# Patient Record
Sex: Female | Born: 1940 | Race: White | Hispanic: No | Smoking: Former smoker
Health system: Southern US, Community
[De-identification: ages and names within clinical notes are randomized; demographics above are authoritative.]

## PROBLEM LIST (undated history)

## (undated) DIAGNOSIS — I10 Essential (primary) hypertension: Secondary | ICD-10-CM

## (undated) DIAGNOSIS — E7800 Pure hypercholesterolemia, unspecified: Secondary | ICD-10-CM

## (undated) DIAGNOSIS — T8859XA Other complications of anesthesia, initial encounter: Secondary | ICD-10-CM

## (undated) DIAGNOSIS — E039 Hypothyroidism, unspecified: Secondary | ICD-10-CM

## (undated) DIAGNOSIS — S82853A Displaced trimalleolar fracture of unspecified lower leg, initial encounter for closed fracture: Secondary | ICD-10-CM

## (undated) DIAGNOSIS — N189 Chronic kidney disease, unspecified: Secondary | ICD-10-CM

## (undated) DIAGNOSIS — G7000 Myasthenia gravis without (acute) exacerbation: Secondary | ICD-10-CM

## (undated) HISTORY — PX: EYE SURGERY: SHX253

## (undated) HISTORY — PX: OTHER SURGICAL HISTORY: SHX169

---

## 1997-11-09 ENCOUNTER — Ambulatory Visit (HOSPITAL_COMMUNITY): Admission: RE | Admit: 1997-11-09 | Discharge: 1997-11-09 | Payer: Self-pay | Admitting: Family Medicine

## 1998-11-14 ENCOUNTER — Ambulatory Visit (HOSPITAL_COMMUNITY): Admission: RE | Admit: 1998-11-14 | Discharge: 1998-11-14 | Payer: Self-pay | Admitting: Psychiatry

## 1998-11-14 ENCOUNTER — Encounter: Payer: Self-pay | Admitting: Family Medicine

## 1999-04-07 ENCOUNTER — Encounter: Payer: Self-pay | Admitting: Family Medicine

## 1999-04-07 ENCOUNTER — Ambulatory Visit (HOSPITAL_COMMUNITY): Admission: RE | Admit: 1999-04-07 | Discharge: 1999-04-07 | Payer: Self-pay | Admitting: Family Medicine

## 1999-11-20 ENCOUNTER — Encounter: Payer: Self-pay | Admitting: Family Medicine

## 1999-11-20 ENCOUNTER — Ambulatory Visit (HOSPITAL_COMMUNITY): Admission: RE | Admit: 1999-11-20 | Discharge: 1999-11-20 | Payer: Self-pay | Admitting: Family Medicine

## 2000-11-28 ENCOUNTER — Encounter: Payer: Self-pay | Admitting: Family Medicine

## 2000-11-28 ENCOUNTER — Ambulatory Visit (HOSPITAL_COMMUNITY): Admission: RE | Admit: 2000-11-28 | Discharge: 2000-11-28 | Payer: Self-pay | Admitting: Family Medicine

## 2001-08-29 ENCOUNTER — Encounter (INDEPENDENT_AMBULATORY_CARE_PROVIDER_SITE_OTHER): Payer: Self-pay | Admitting: Specialist

## 2001-08-29 ENCOUNTER — Ambulatory Visit (HOSPITAL_COMMUNITY): Admission: RE | Admit: 2001-08-29 | Discharge: 2001-08-29 | Payer: Self-pay | Admitting: *Deleted

## 2001-12-01 ENCOUNTER — Encounter: Payer: Self-pay | Admitting: Family Medicine

## 2001-12-01 ENCOUNTER — Ambulatory Visit (HOSPITAL_COMMUNITY): Admission: RE | Admit: 2001-12-01 | Discharge: 2001-12-01 | Payer: Self-pay | Admitting: Family Medicine

## 2002-12-04 ENCOUNTER — Encounter: Payer: Self-pay | Admitting: Family Medicine

## 2002-12-04 ENCOUNTER — Ambulatory Visit (HOSPITAL_COMMUNITY): Admission: RE | Admit: 2002-12-04 | Discharge: 2002-12-04 | Payer: Self-pay | Admitting: Family Medicine

## 2003-05-03 ENCOUNTER — Other Ambulatory Visit: Admission: RE | Admit: 2003-05-03 | Discharge: 2003-05-03 | Payer: Self-pay | Admitting: Family Medicine

## 2003-12-06 ENCOUNTER — Ambulatory Visit (HOSPITAL_COMMUNITY): Admission: RE | Admit: 2003-12-06 | Discharge: 2003-12-06 | Payer: Self-pay | Admitting: Family Medicine

## 2004-12-06 ENCOUNTER — Ambulatory Visit (HOSPITAL_COMMUNITY): Admission: RE | Admit: 2004-12-06 | Discharge: 2004-12-06 | Payer: Self-pay | Admitting: Family Medicine

## 2005-05-14 ENCOUNTER — Other Ambulatory Visit: Admission: RE | Admit: 2005-05-14 | Discharge: 2005-05-14 | Payer: Self-pay | Admitting: Family Medicine

## 2005-12-11 ENCOUNTER — Ambulatory Visit (HOSPITAL_COMMUNITY): Admission: RE | Admit: 2005-12-11 | Discharge: 2005-12-11 | Payer: Self-pay | Admitting: Family Medicine

## 2005-12-11 IMAGING — MG MM DIGITAL SCREENING BILAT
3 series · 3 of 3 positions shown · non-contrast
Comparison: none

DG SCREEN MAMMOGRAM BILATERAL
Bilateral CC and MLO view(s) were taken.
Prior study comparison: [DATE], bilateral screening mammogram.

SCREENING MAMMOGRAM:
The breast tissue is heterogeneously dense.  There is no dominant mass, architectural distortion or
calcification to suggest malignancy.

[R MLO]
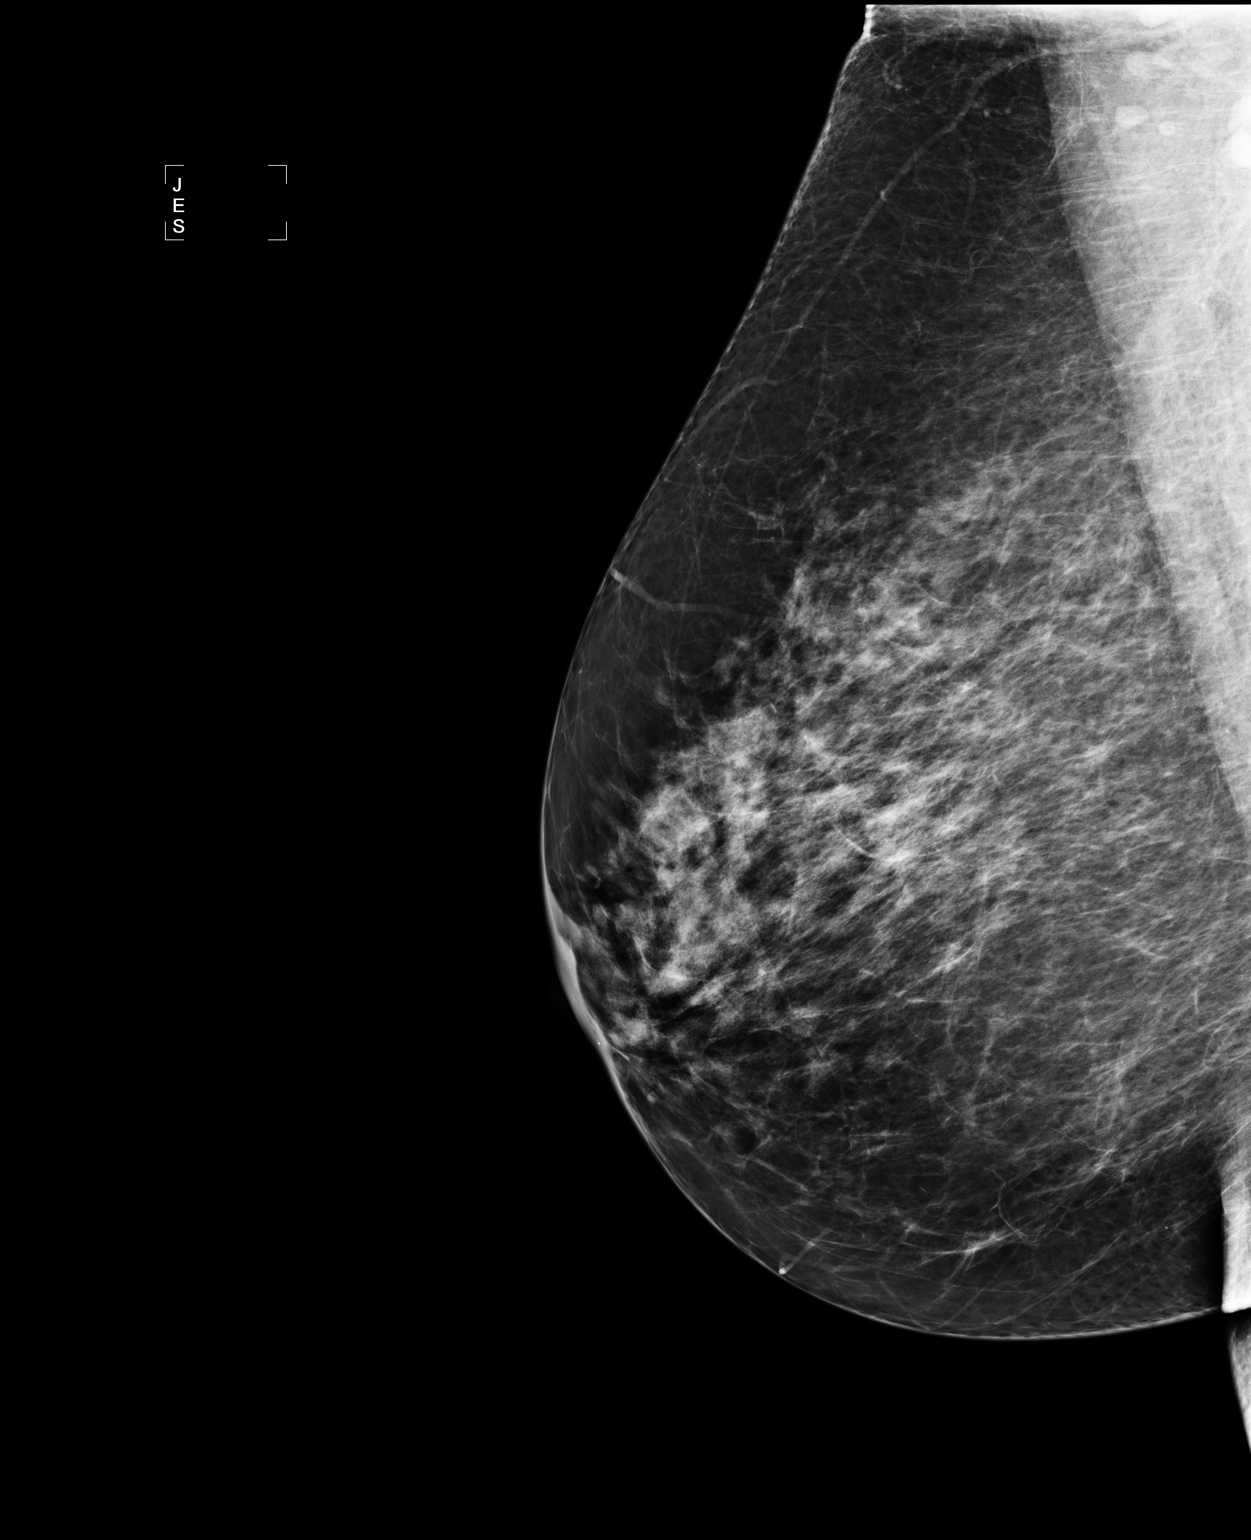

[L CC]
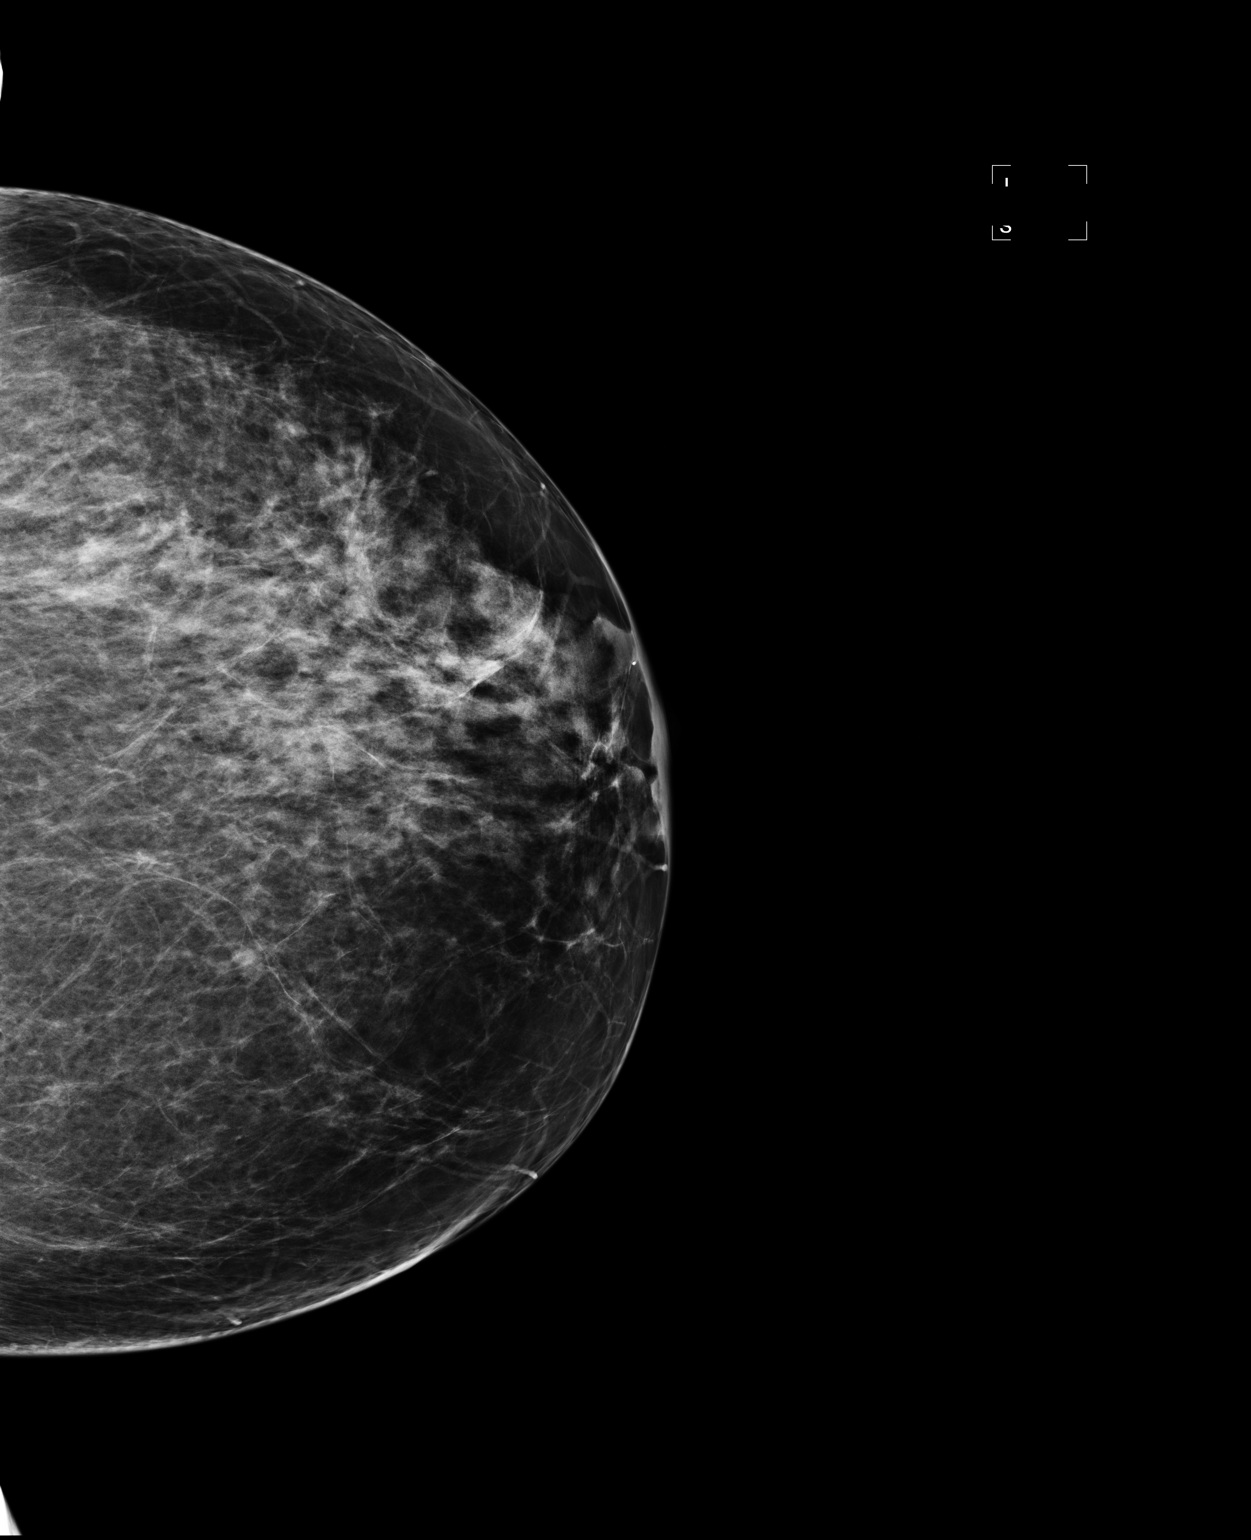

[L MLO]
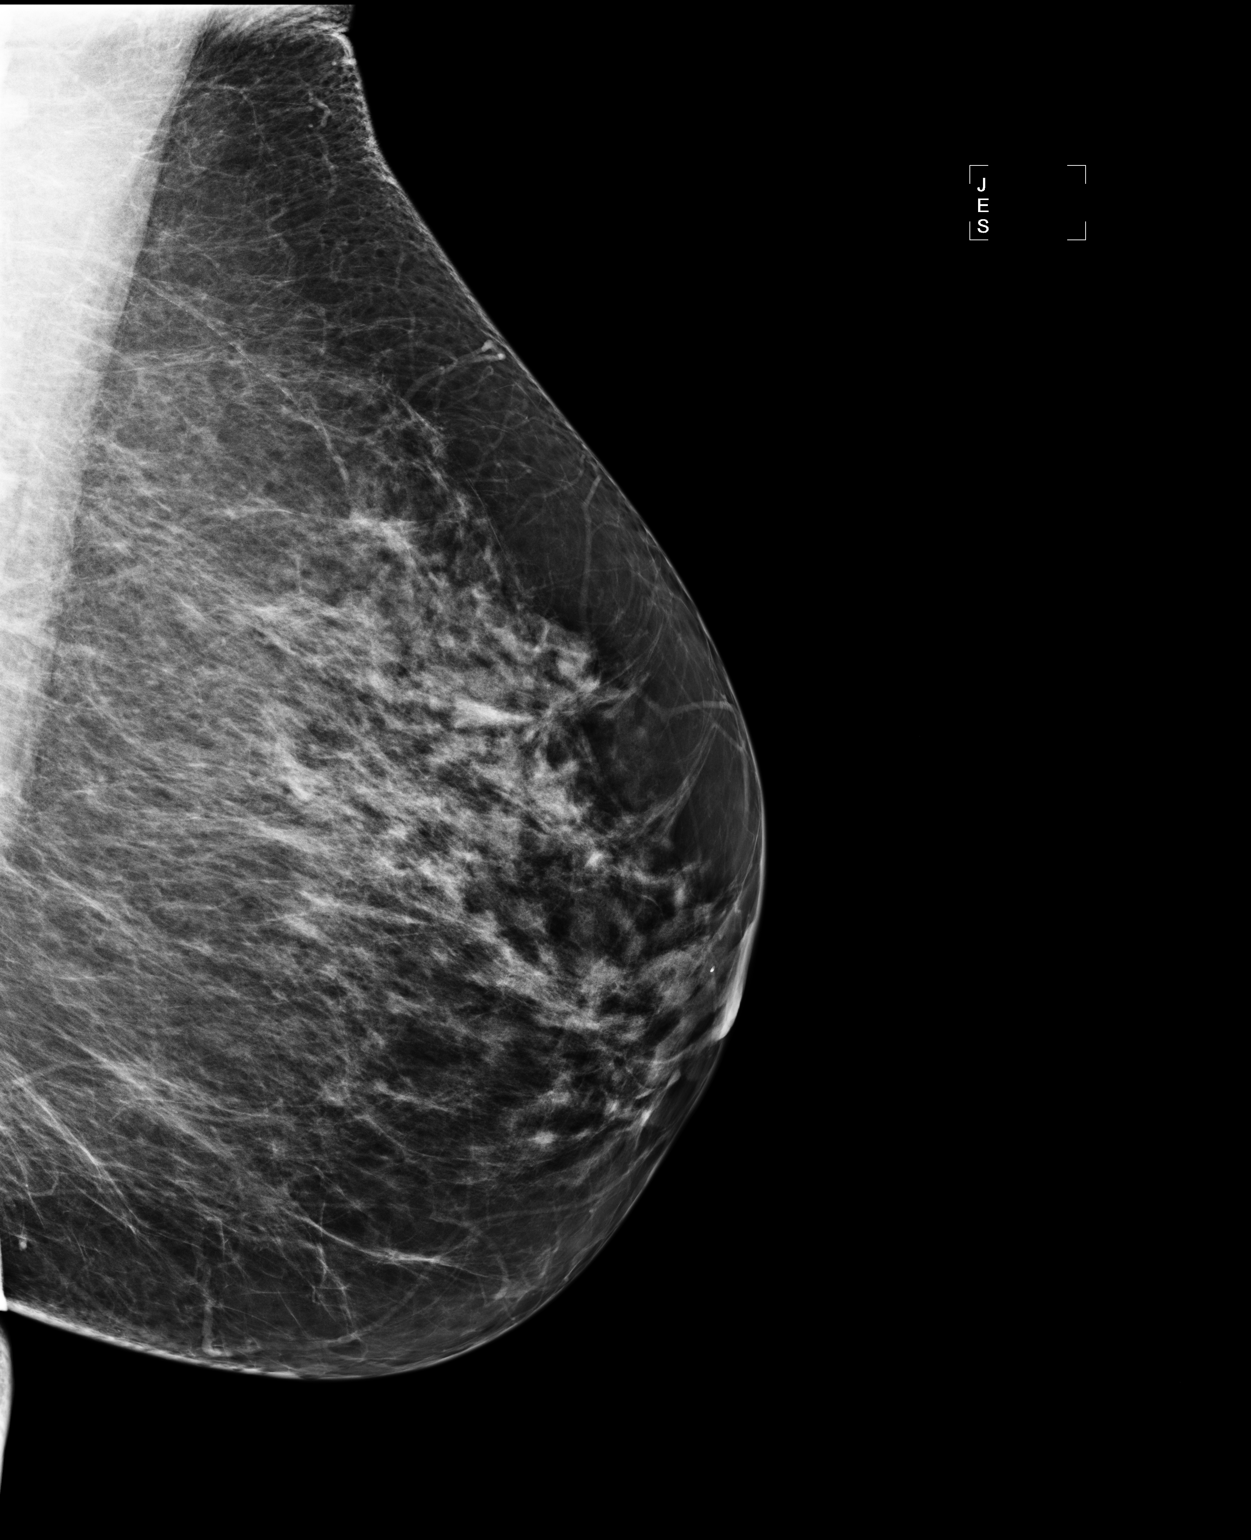

[3 of 3 positions shown; findings below may reference images not displayed]

IMPRESSION: No mammographic evidence of malignancy.  Suggest yearly screening mammography.

ASSESSMENT: Negative - BI-RADS 1

Screening mammogram in 1 year.
ANALYZED BY COMPUTER AIDED DETECTION. , THIS PROCEDURE WAS A DIGITAL MAMMOGRAM.

## 2006-12-16 ENCOUNTER — Ambulatory Visit (HOSPITAL_COMMUNITY): Admission: RE | Admit: 2006-12-16 | Discharge: 2006-12-16 | Payer: Self-pay | Admitting: Family Medicine

## 2006-12-16 IMAGING — MG MM DIGITAL SCREENING BILAT
4 series · 4 of 4 positions shown · non-contrast
Comparison: none

DG SCREEN MAMMOGRAM BILATERAL
Bilateral CC and MLO view(s) were taken.

DIGITAL SCREENING MAMMOGRAM WITH CAD:
There are scattered fibroglandular densities.  No masses or malignant type calcifications are 
identified.  Compared with prior studies.

[R CC]
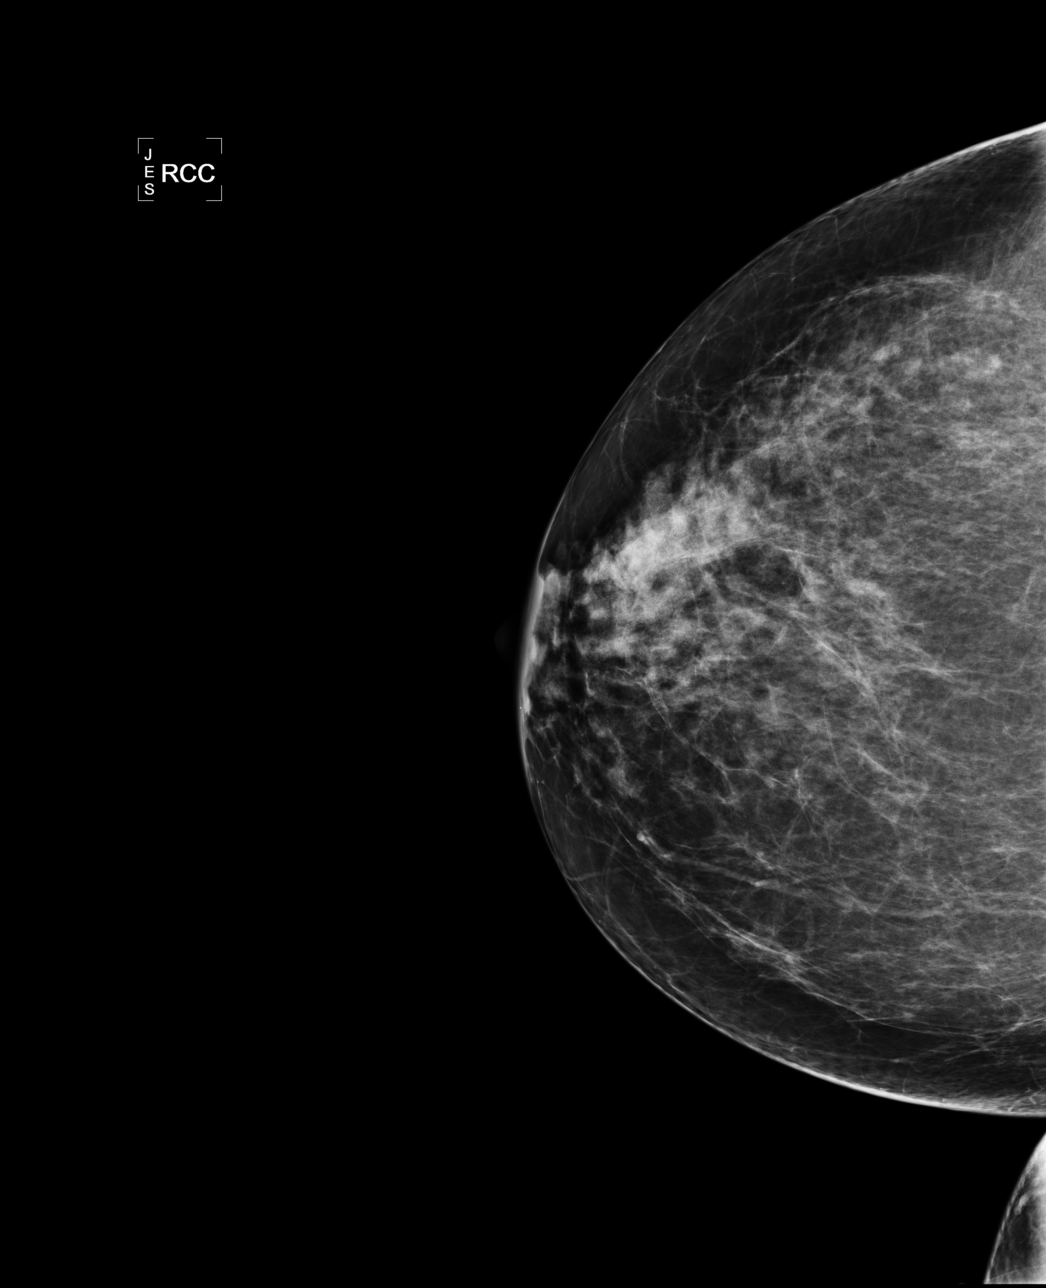

[R MLO]
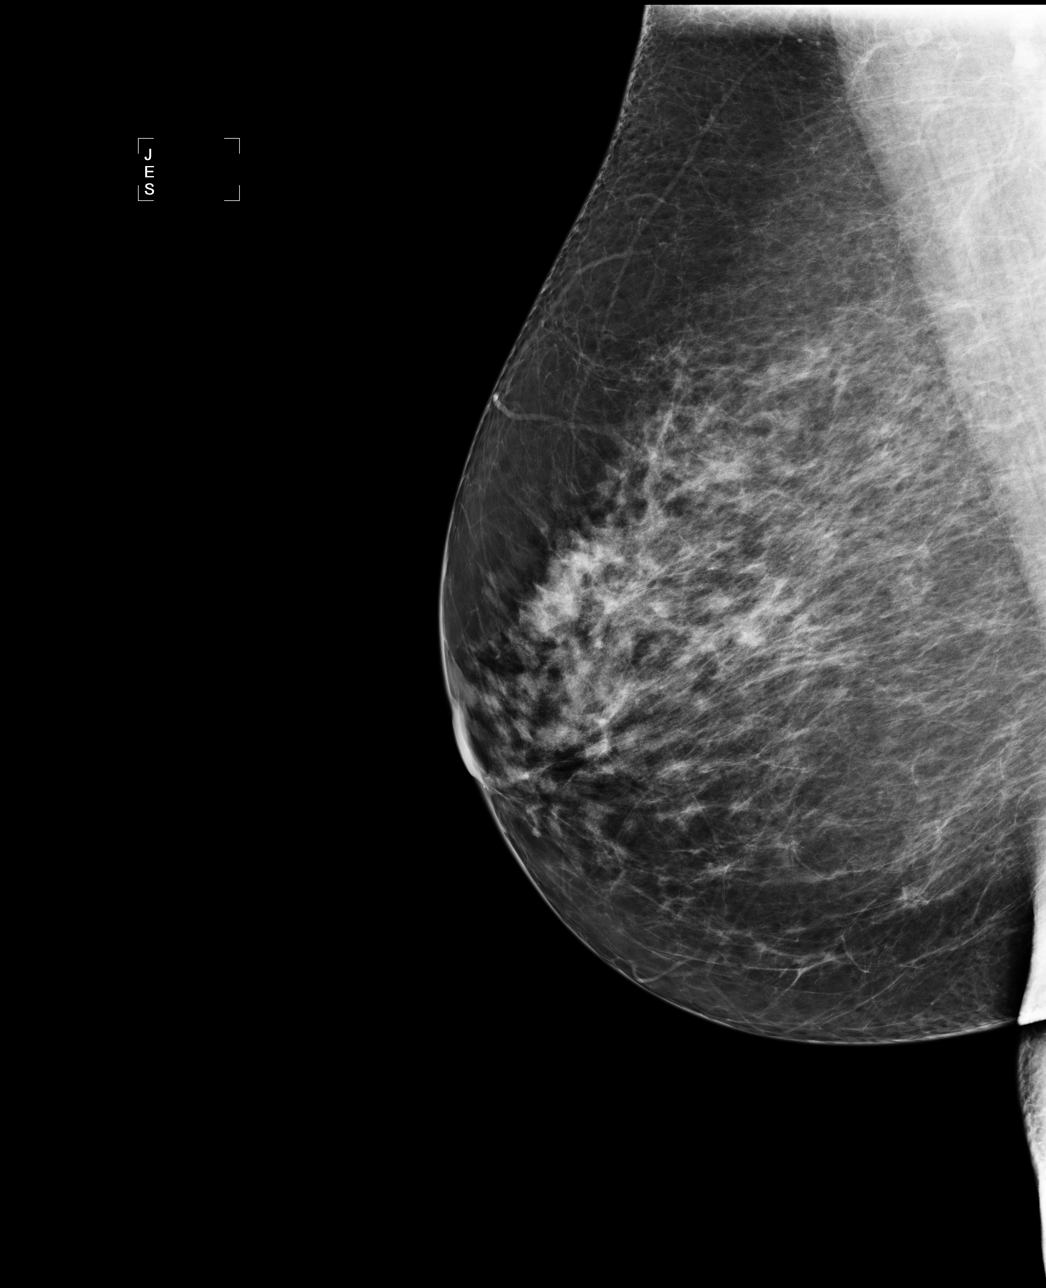

[L CC]
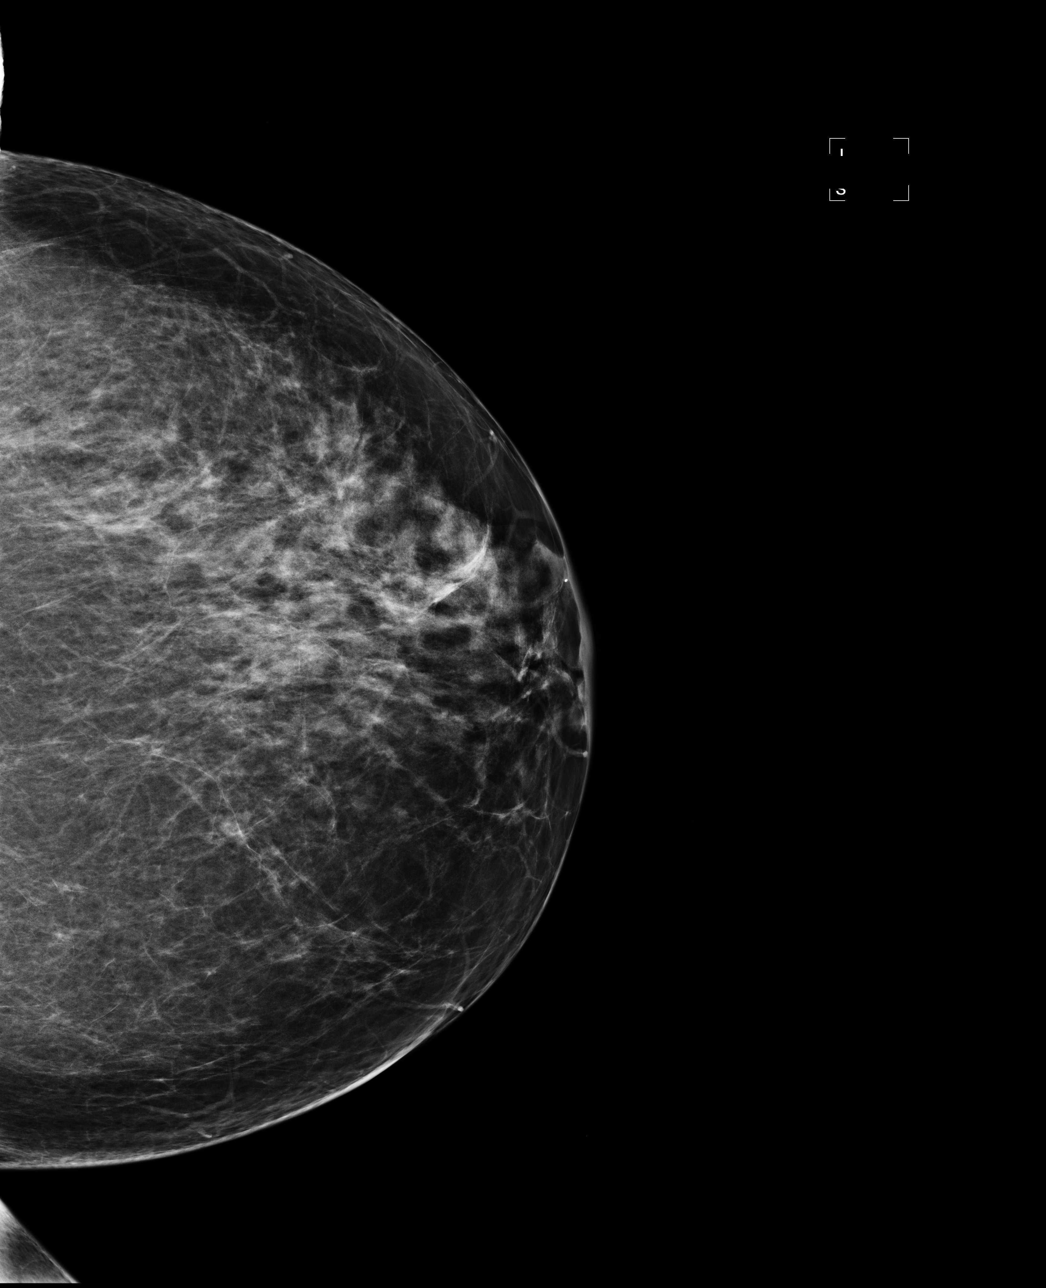

[L MLO]
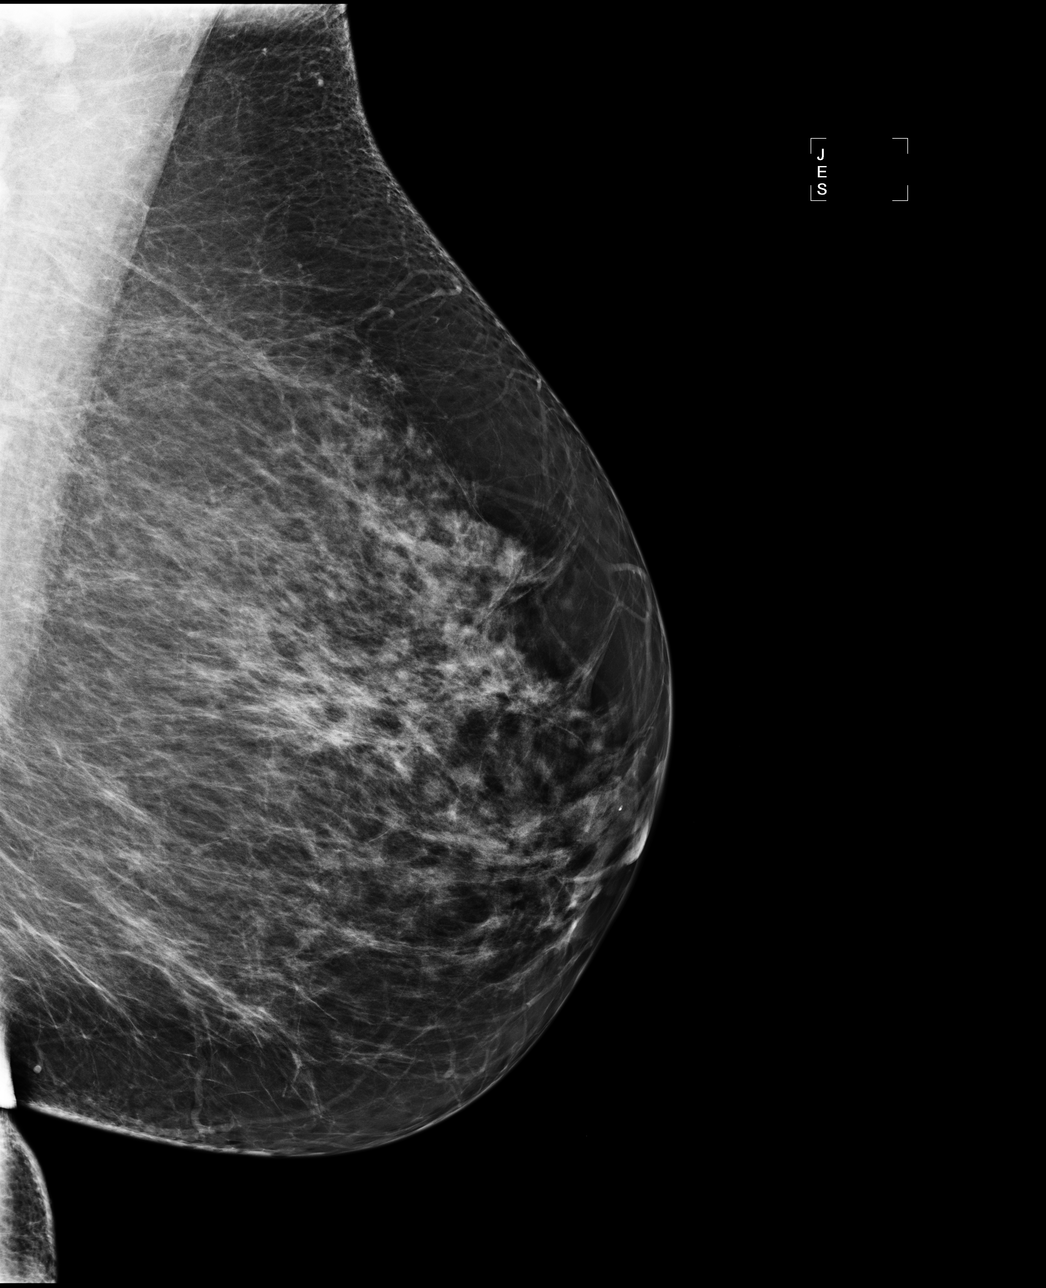

[4 of 4 positions shown; findings below may reference images not displayed]

IMPRESSION: No specific mammographic evidence of malignancy.  Next screening mammogram is recommended in one 
year.

ASSESSMENT: Negative - BI-RADS 1

Screening mammogram in 1 year.
ANALYZED BY COMPUTER AIDED DETECTION. , THIS PROCEDURE WAS A DIGITAL MAMMOGRAM.

## 2007-05-28 ENCOUNTER — Other Ambulatory Visit: Admission: RE | Admit: 2007-05-28 | Discharge: 2007-05-28 | Payer: Self-pay | Admitting: Family Medicine

## 2007-12-19 ENCOUNTER — Ambulatory Visit (HOSPITAL_COMMUNITY): Admission: RE | Admit: 2007-12-19 | Discharge: 2007-12-19 | Payer: Self-pay | Admitting: Family Medicine

## 2007-12-19 IMAGING — MG MM DIGITAL SCREENING BILAT
6 series · 6 of 6 positions shown · non-contrast
Comparison: none

DG SCREEN MAMMOGRAM BILATERAL
Bilateral CC and MLO view(s) were taken.

DIGITAL SCREENING MAMMOGRAM WITH CAD:
The breast tissue is heterogeneously dense.  No masses or malignant type calcifications are 
identified.  Compared with prior studies.

[R CC]
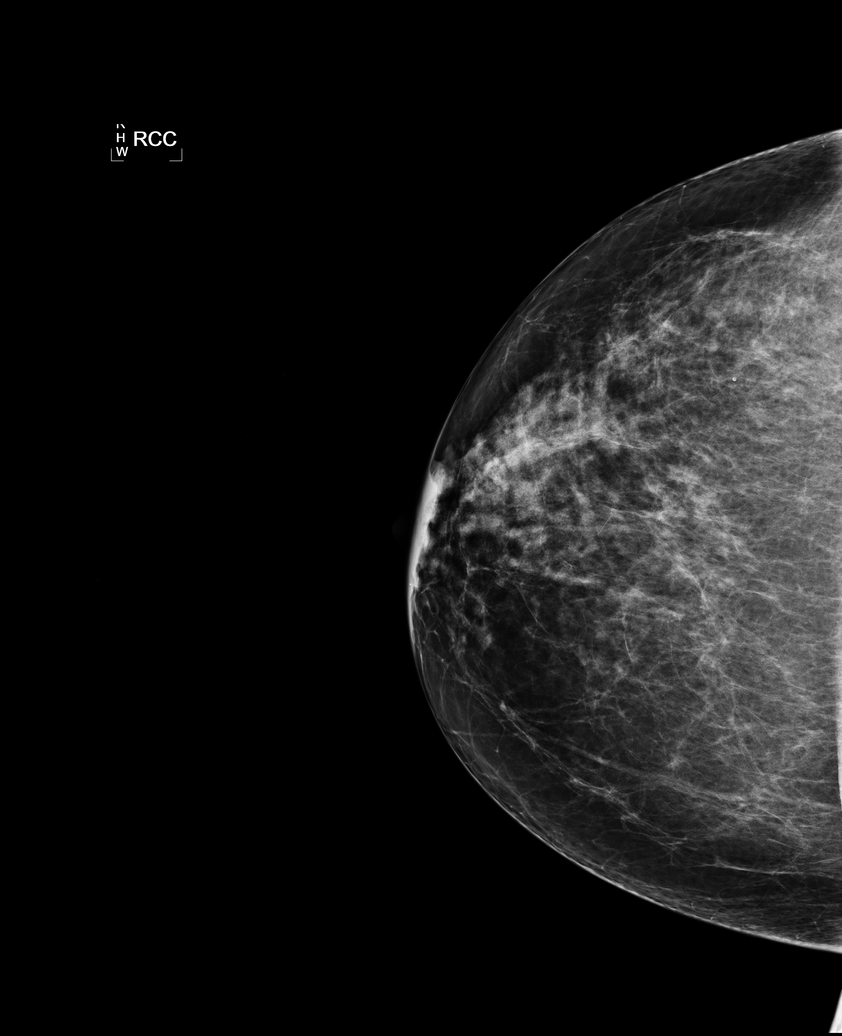

[R MLO (1 of 2)]
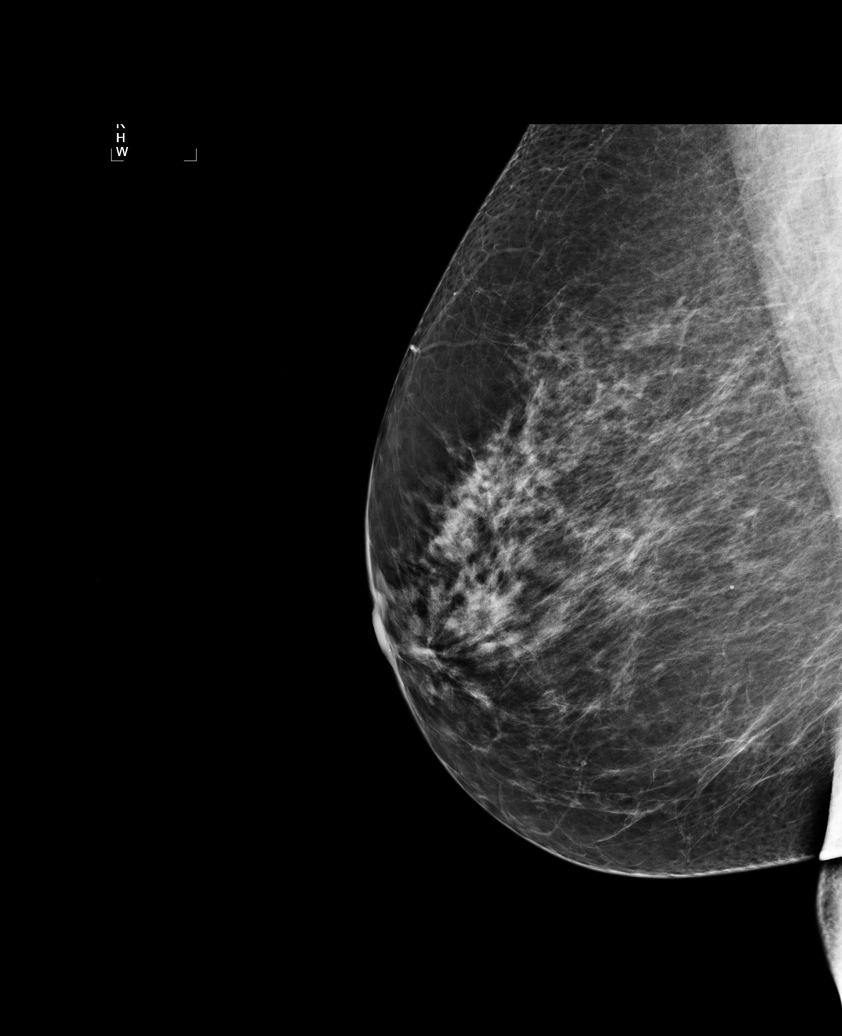

[L CC]
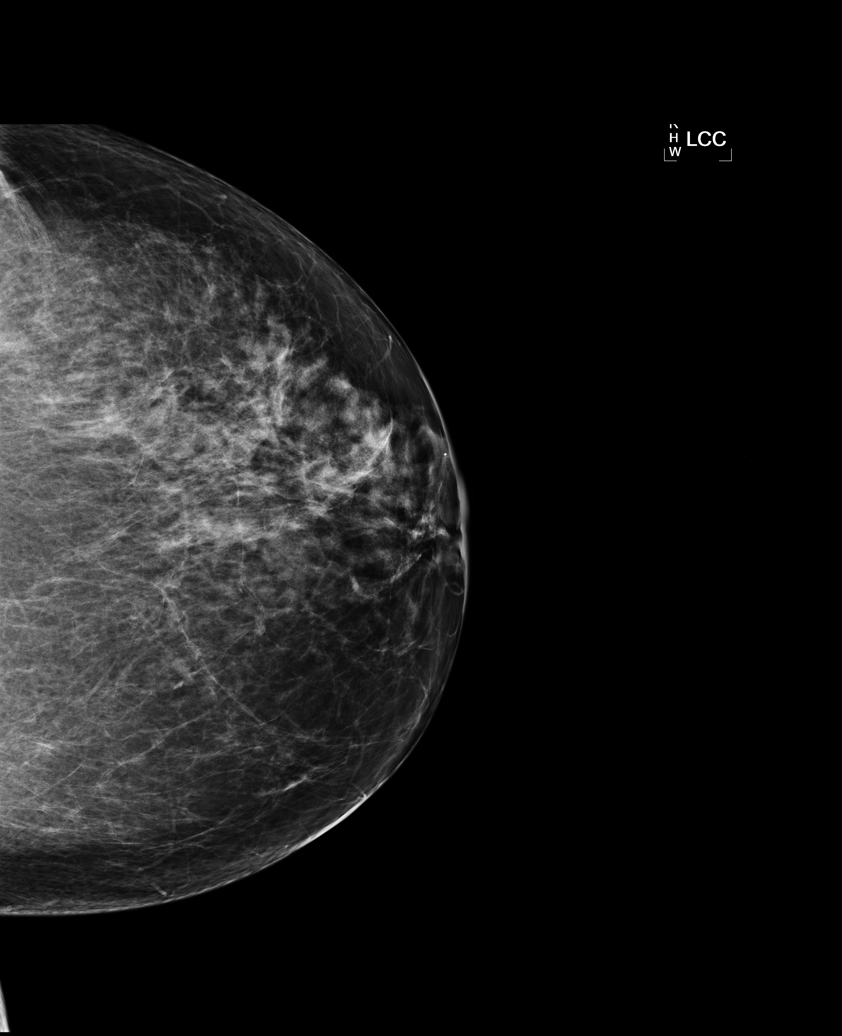

[L MLO (1 of 2)]
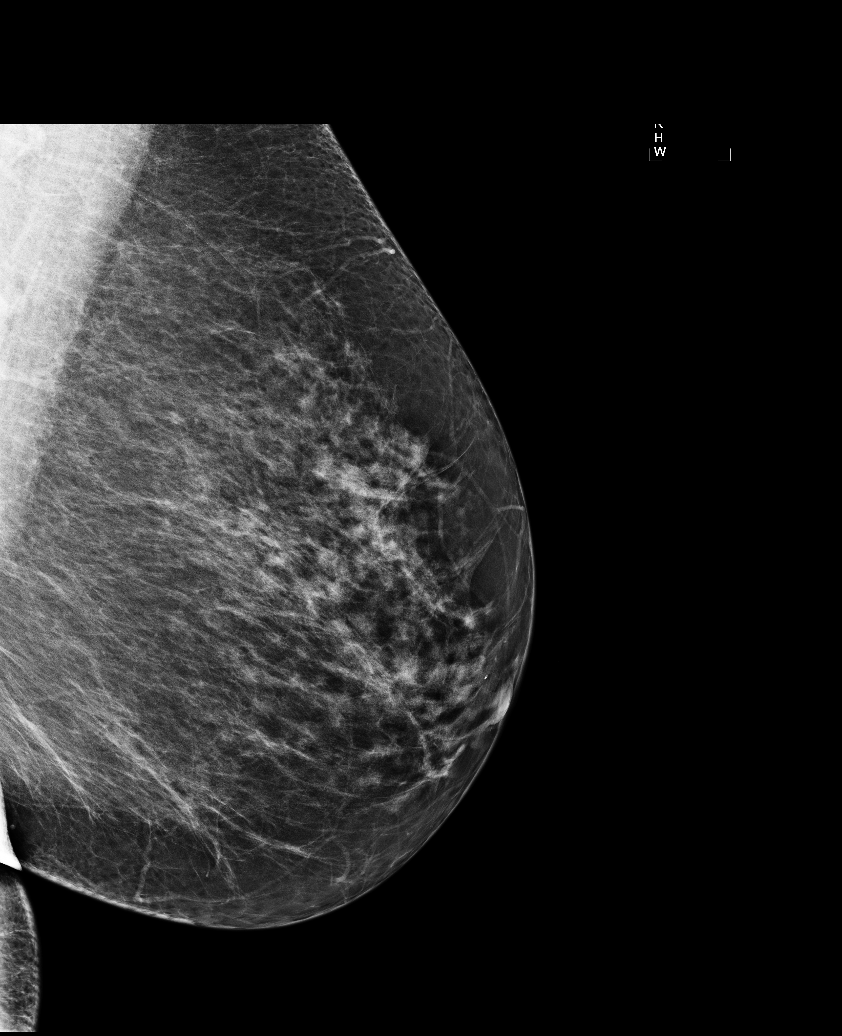

[R MLO (2 of 2)]
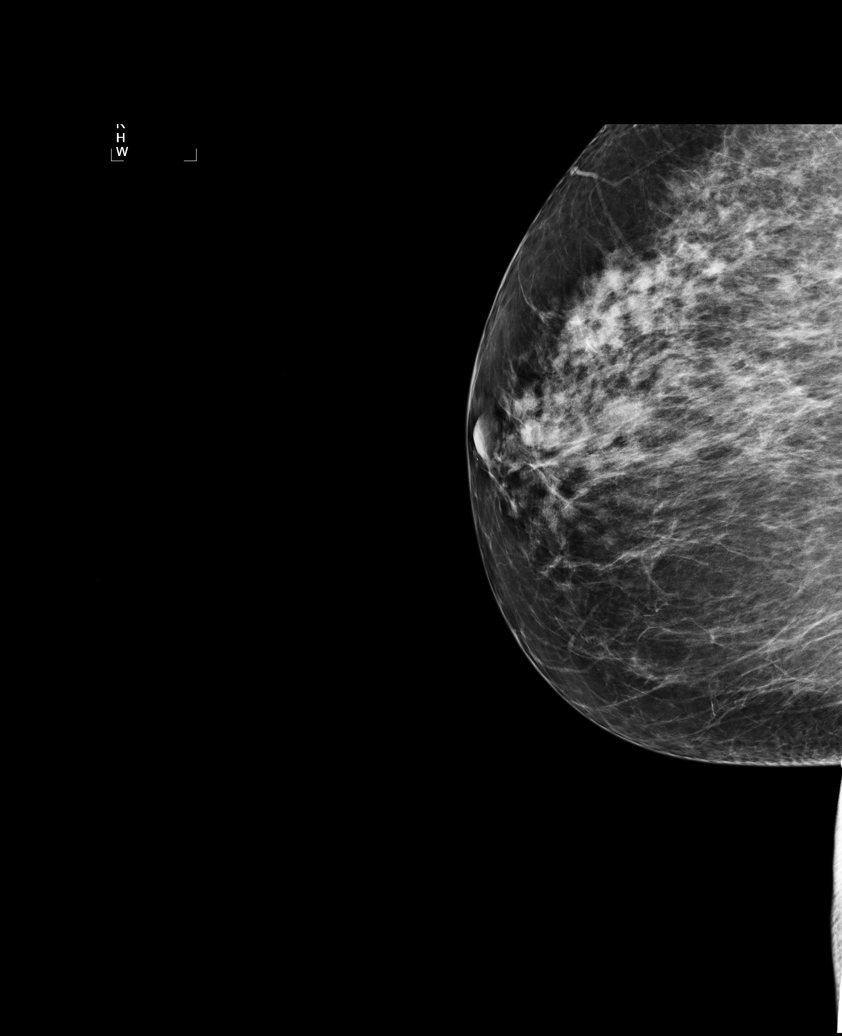

[L MLO (2 of 2)]
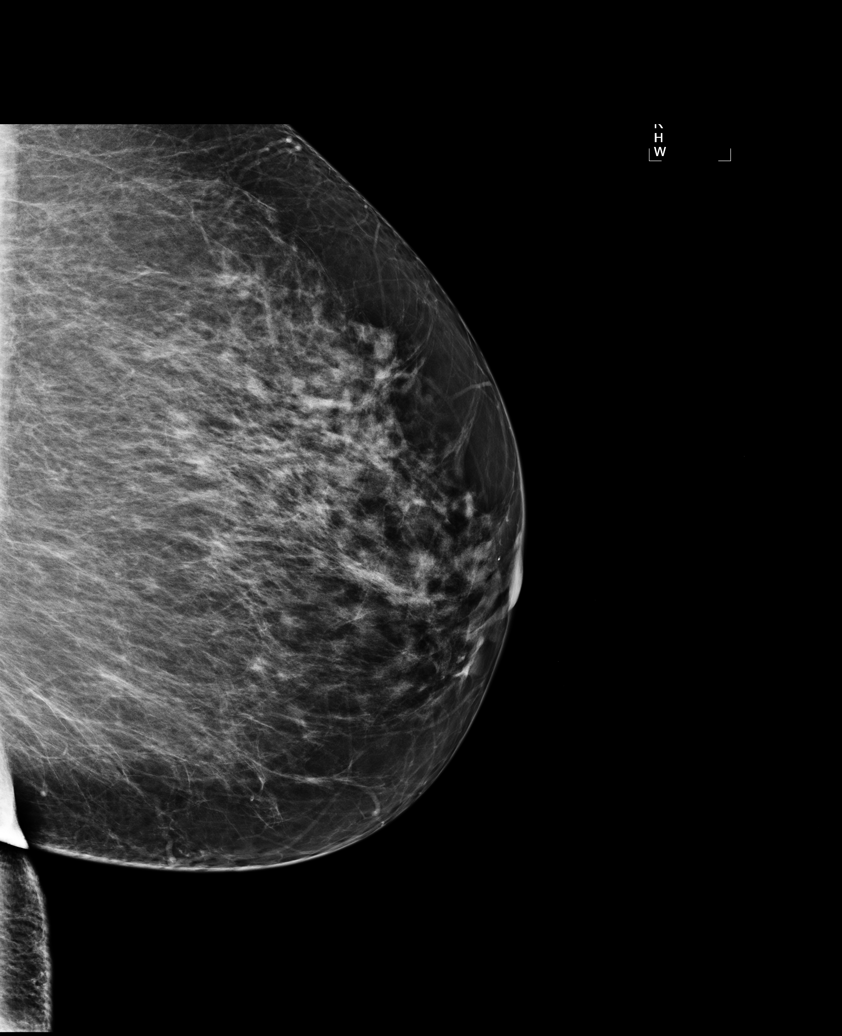

[6 of 6 positions shown; findings below may reference images not displayed]

IMPRESSION: No specific mammographic evidence of malignancy.  Next screening mammogram is recommended in one 
year.

ASSESSMENT: Negative - BI-RADS 1

Screening mammogram in 1 year.
ANALYZED BY COMPUTER AIDED DETECTION. , THIS PROCEDURE WAS A DIGITAL MAMMOGRAM.

## 2008-12-20 ENCOUNTER — Ambulatory Visit (HOSPITAL_COMMUNITY): Admission: RE | Admit: 2008-12-20 | Discharge: 2008-12-20 | Payer: Self-pay | Admitting: Family Medicine

## 2008-12-20 IMAGING — MG MM DIGITAL SCREENING BILAT
4 series · 4 of 4 positions shown · non-contrast
Comparison: none

DG SCREEN MAMMOGRAM BILATERAL
Bilateral CC and MLO view(s) were taken.

DIGITAL SCREENING MAMMOGRAM WITH CAD:
The breast tissue is heterogeneously dense.  No masses or malignant type calcifications are 
identified.  Compared with prior studies.
Images were processed with CAD.

[R CC]
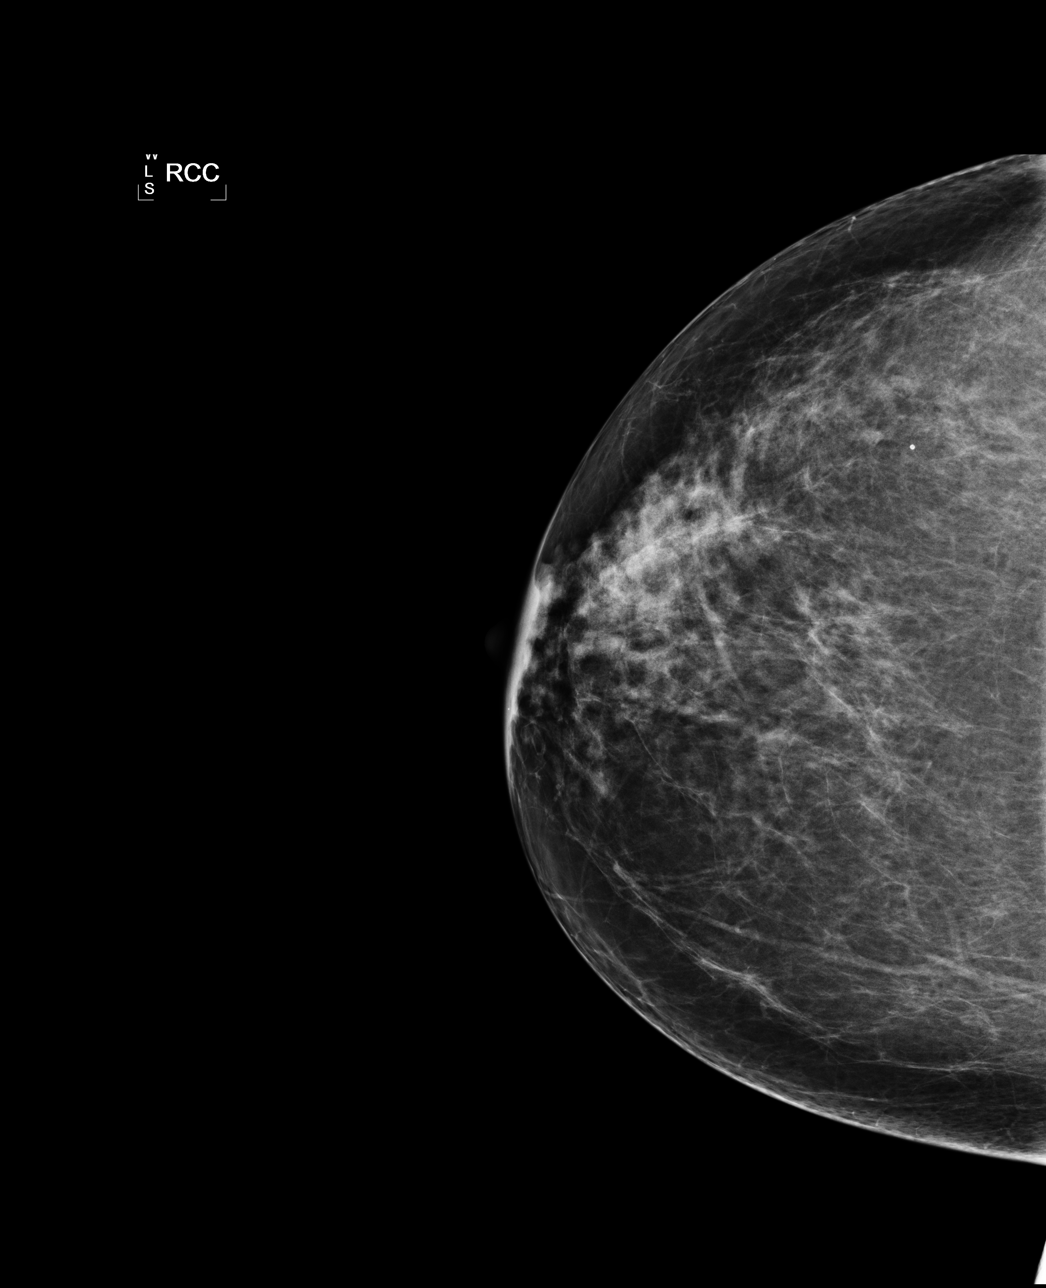

[R MLO]
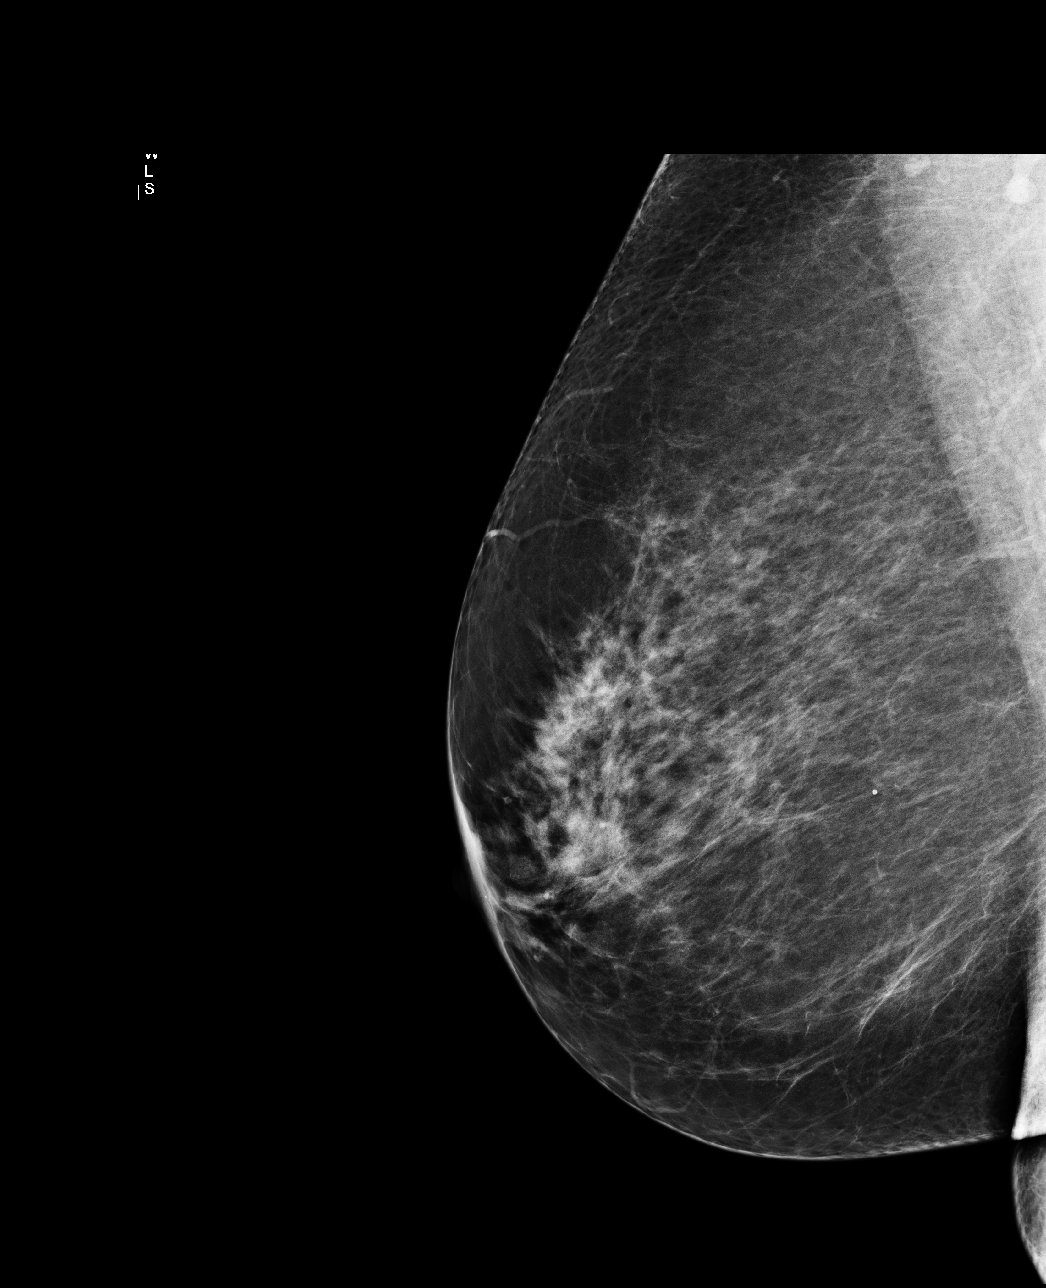

[L CC]
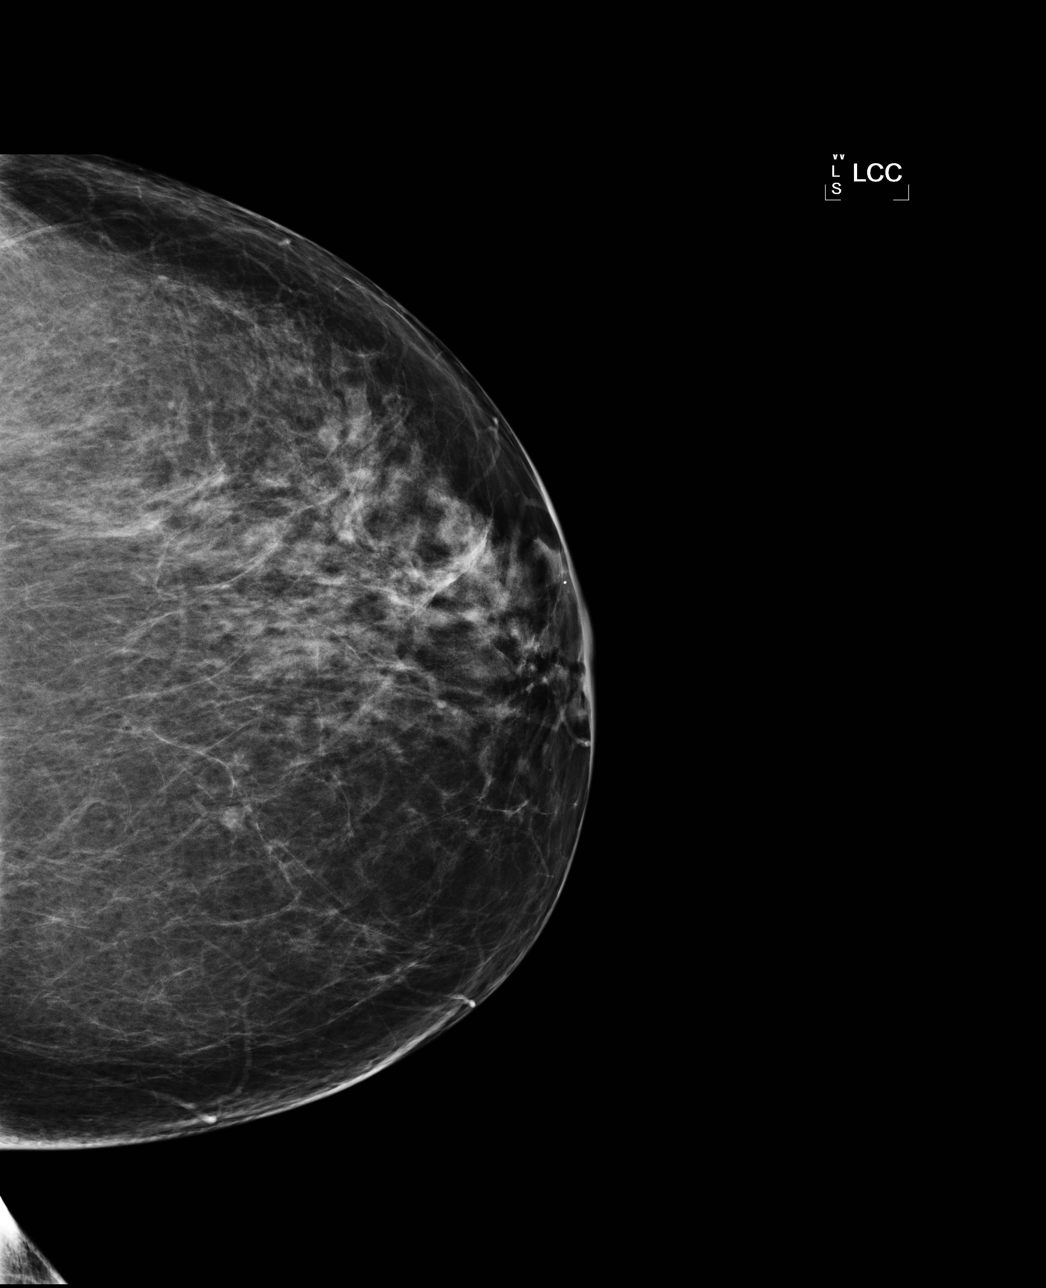

[L MLO]
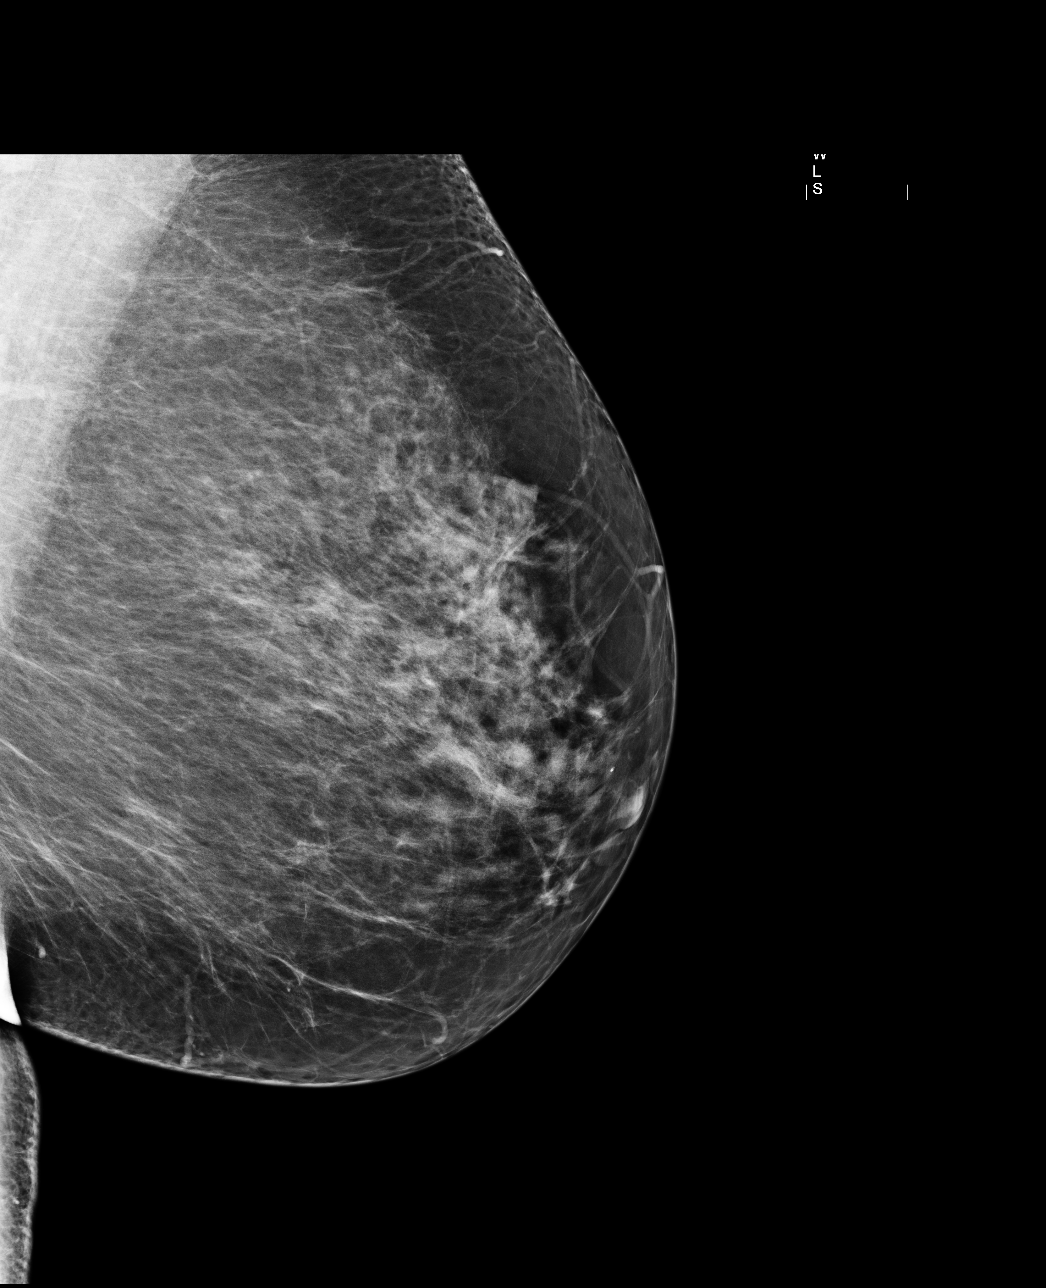

[4 of 4 positions shown; findings below may reference images not displayed]

IMPRESSION: No specific mammographic evidence of malignancy.  Next screening mammogram is recommended in one 
year.

A result letter of this screening mammogram will be mailed directly to the patient.

ASSESSMENT: Negative - BI-RADS 1

Screening mammogram in 1 year.
,

## 2009-12-22 ENCOUNTER — Ambulatory Visit (HOSPITAL_COMMUNITY): Admission: RE | Admit: 2009-12-22 | Discharge: 2009-12-22 | Payer: Self-pay | Admitting: Family Medicine

## 2009-12-22 IMAGING — MG MM DIGITAL SCREENING
4 series · 4 of 4 positions shown · non-contrast
Comparison: Prior studies.

DG SCREEN MAMMOGRAM BILATERAL
Bilateral CC and MLO view(s) were taken.
Technologist: SIFUL.(SIFUL)(M)

DIGITAL SCREENING MAMMOGRAM WITH CAD:

[R CC]
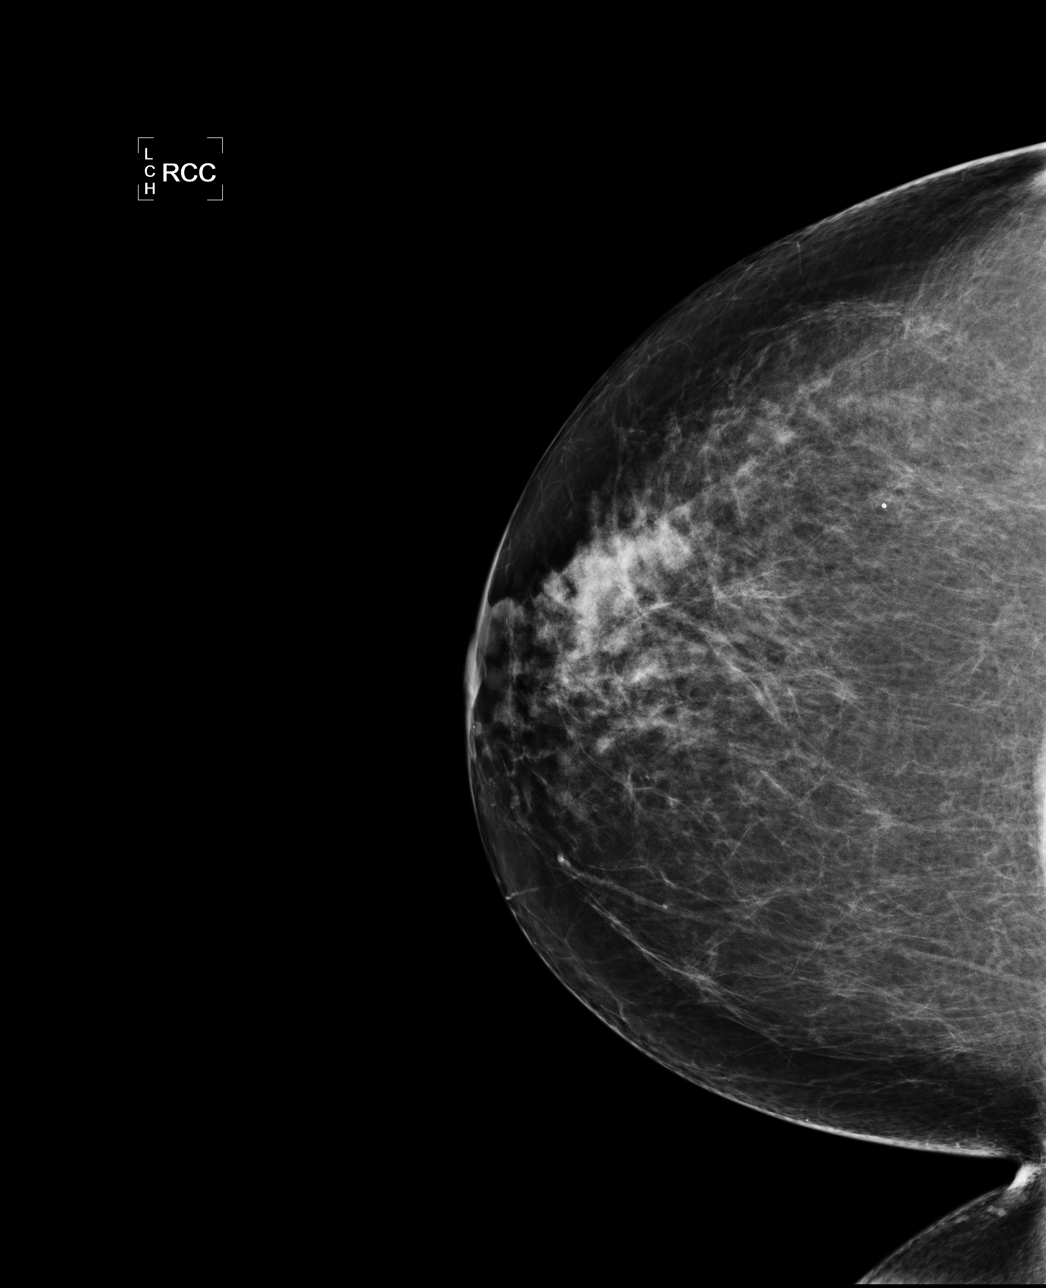

[R MLO]
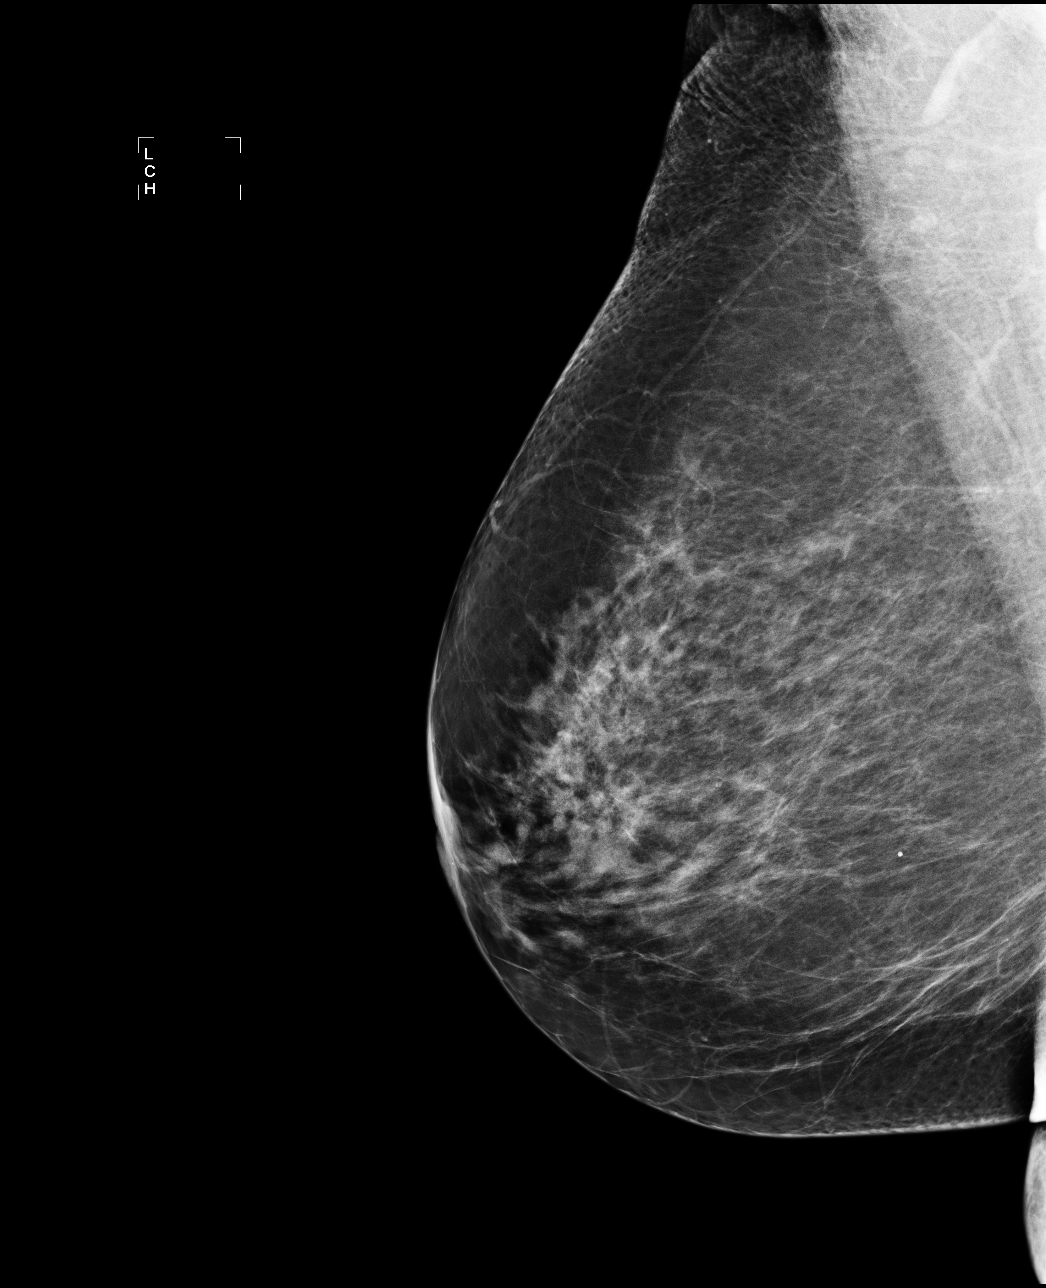

[L CC]
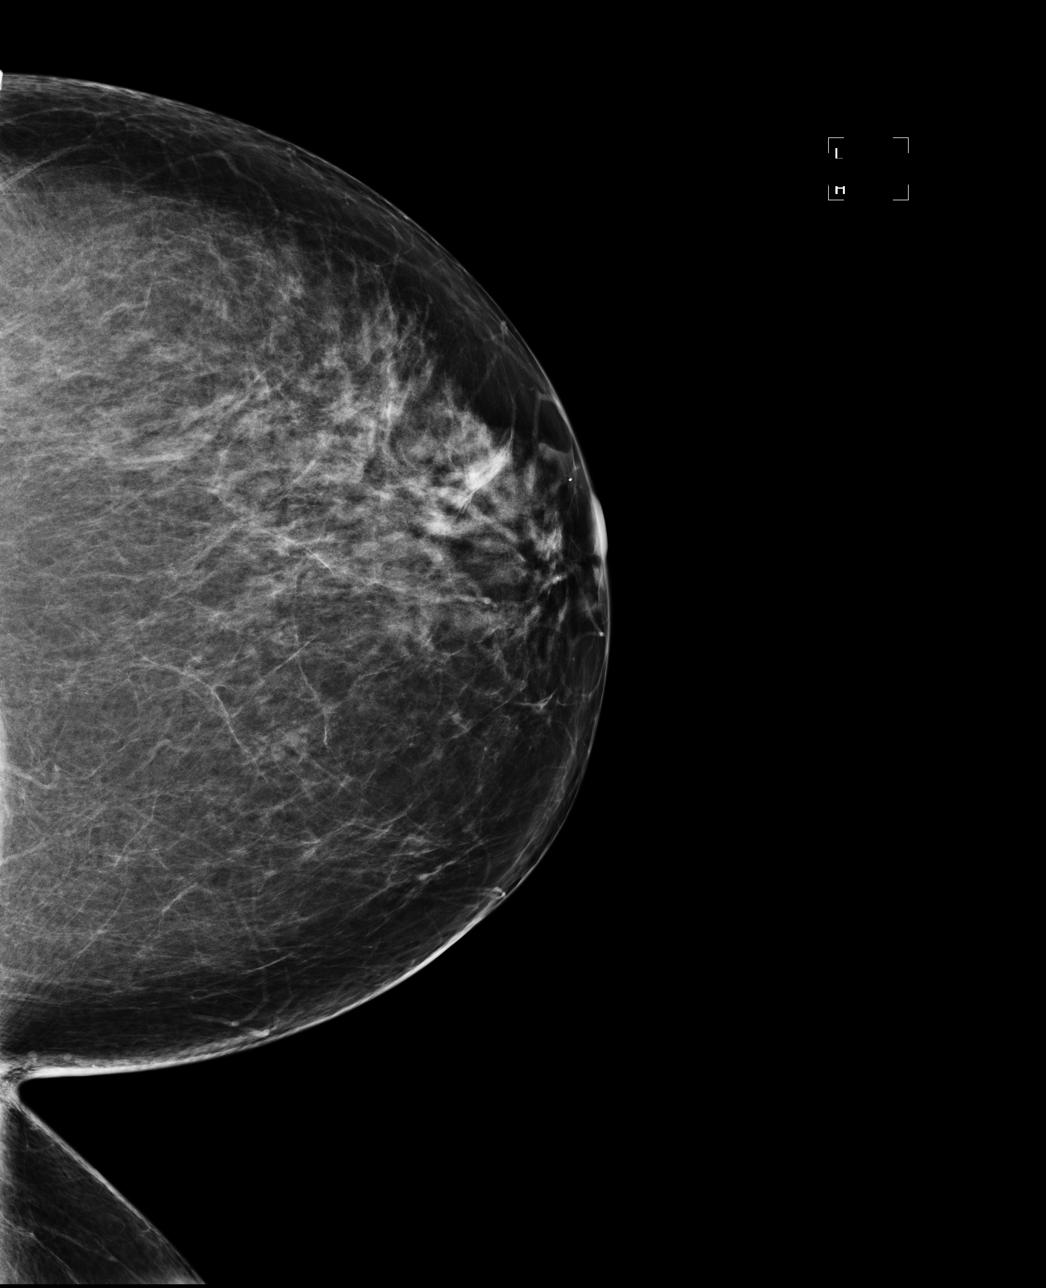

[L MLO]
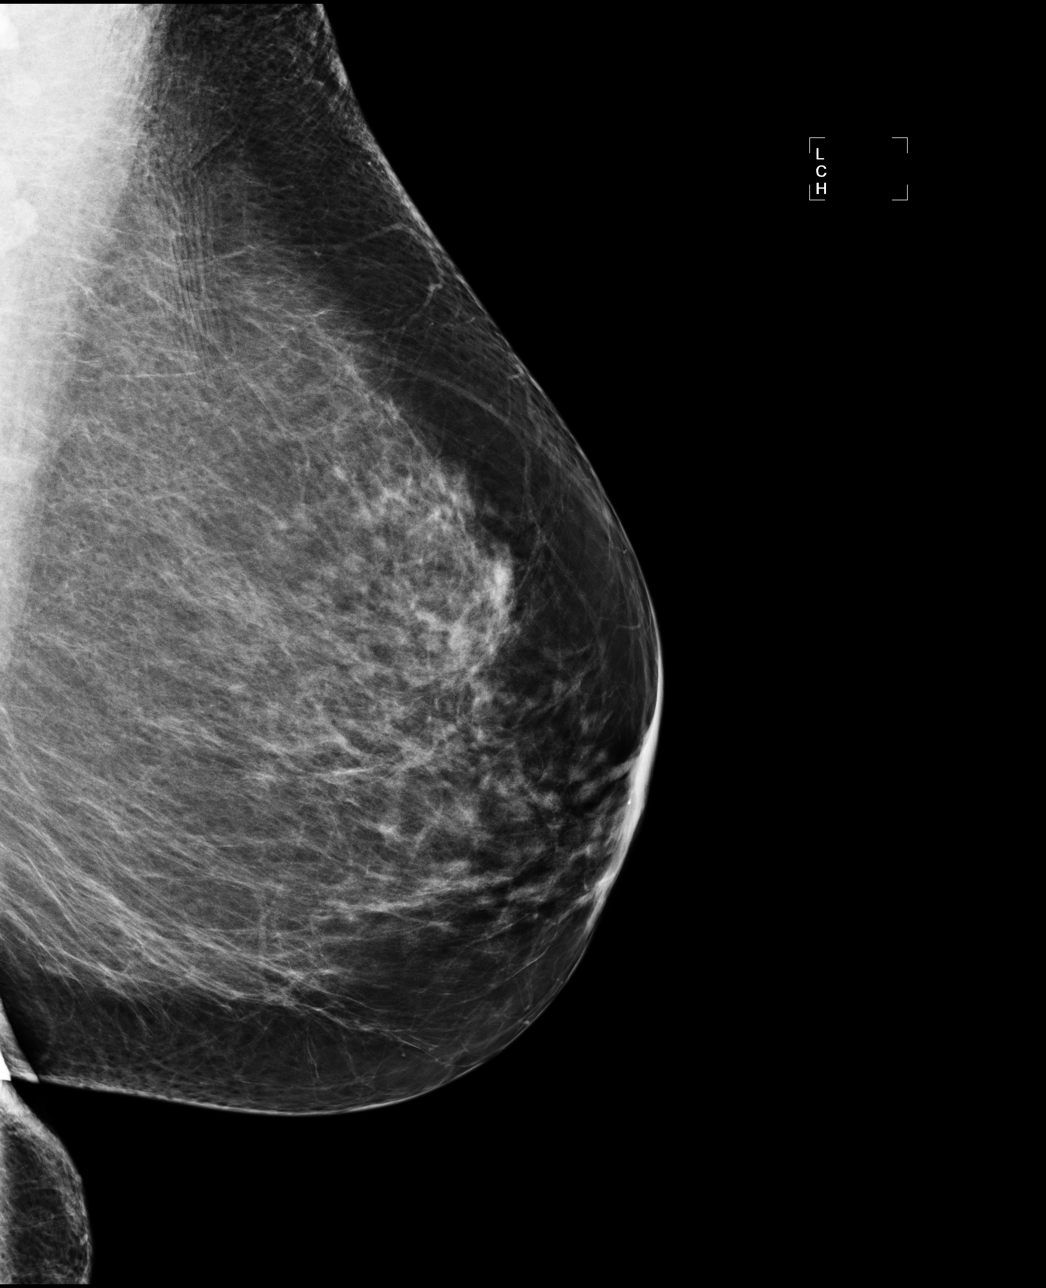

[4 of 4 positions shown; findings below may reference images not displayed]

There are scattered fibroglandular densities.  There is no dominant mass, architectural distortion 
or calcification to suggest malignancy.

Images were processed with CAD.
IMPRESSION: No mammographic evidence of malignancy.  Suggest yearly screening mammography.

A result letter of this screening mammogram will be mailed directly to the patient.

ASSESSMENT: Negative - BI-RADS 1

Screening mammogram in 1 year.
,

## 2010-11-13 ENCOUNTER — Other Ambulatory Visit (HOSPITAL_COMMUNITY): Payer: Self-pay | Admitting: Family Medicine

## 2010-11-13 DIAGNOSIS — Z1231 Encounter for screening mammogram for malignant neoplasm of breast: Secondary | ICD-10-CM

## 2010-12-26 ENCOUNTER — Ambulatory Visit (HOSPITAL_COMMUNITY): Payer: Medicare Other

## 2011-01-09 ENCOUNTER — Ambulatory Visit (HOSPITAL_COMMUNITY)
Admission: RE | Admit: 2011-01-09 | Discharge: 2011-01-09 | Disposition: A | Discharge status: Home / Self Care | Payer: Medicare Other | Source: Ambulatory Visit | Attending: Family Medicine | Admitting: Family Medicine

## 2011-01-09 DIAGNOSIS — Z1231 Encounter for screening mammogram for malignant neoplasm of breast: Secondary | ICD-10-CM | POA: Insufficient documentation

## 2011-01-09 IMAGING — MG MM DIGITAL SCREENING {WH}
5 series · 5 of 5 positions shown · non-contrast
Comparison: none

DG SCREEN MAMMOGRAM BILATERAL
Bilateral CC and MLO view(s) were taken.
Technologist: EDUCATIONORGANIZATION.(EDUCATIONORGANIZATION)(M)

DIGITAL SCREENING MAMMOGRAM WITH CAD:
There are scattered fibroglandular densities.  No masses or malignant type calcifications are 
identified.  Compared with prior studies.
Images were processed with CAD.

[R CC]
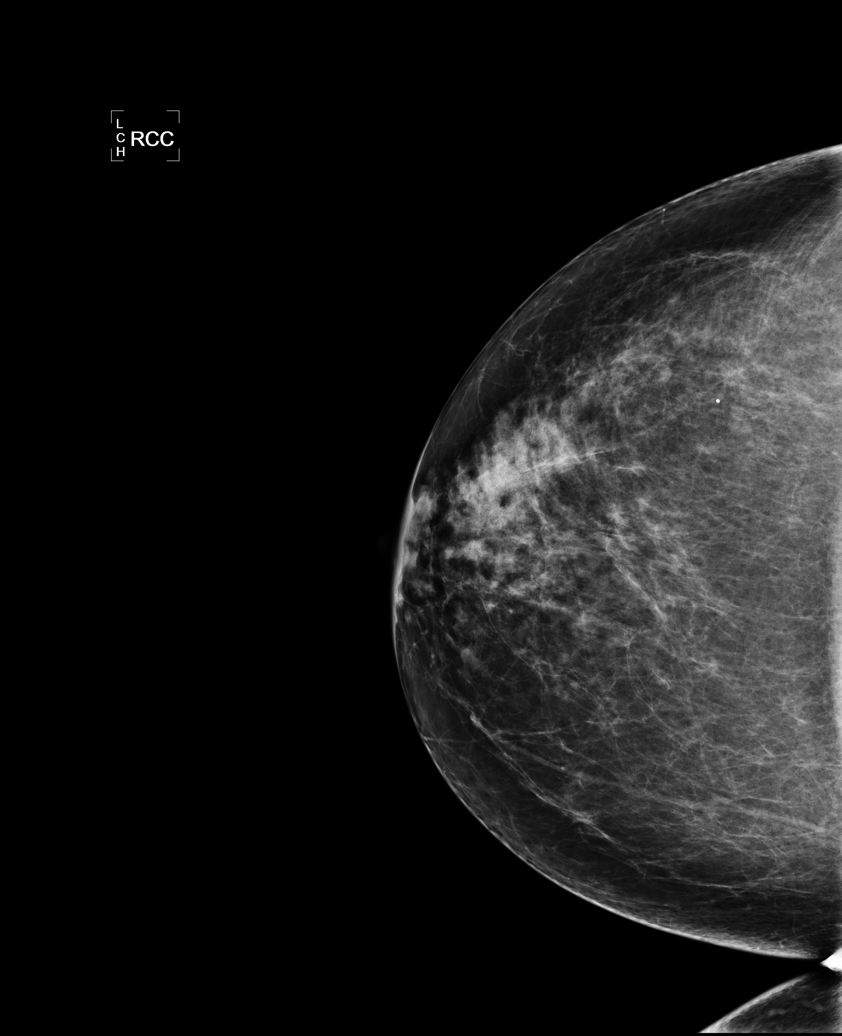

[R MLO (1 of 2)]
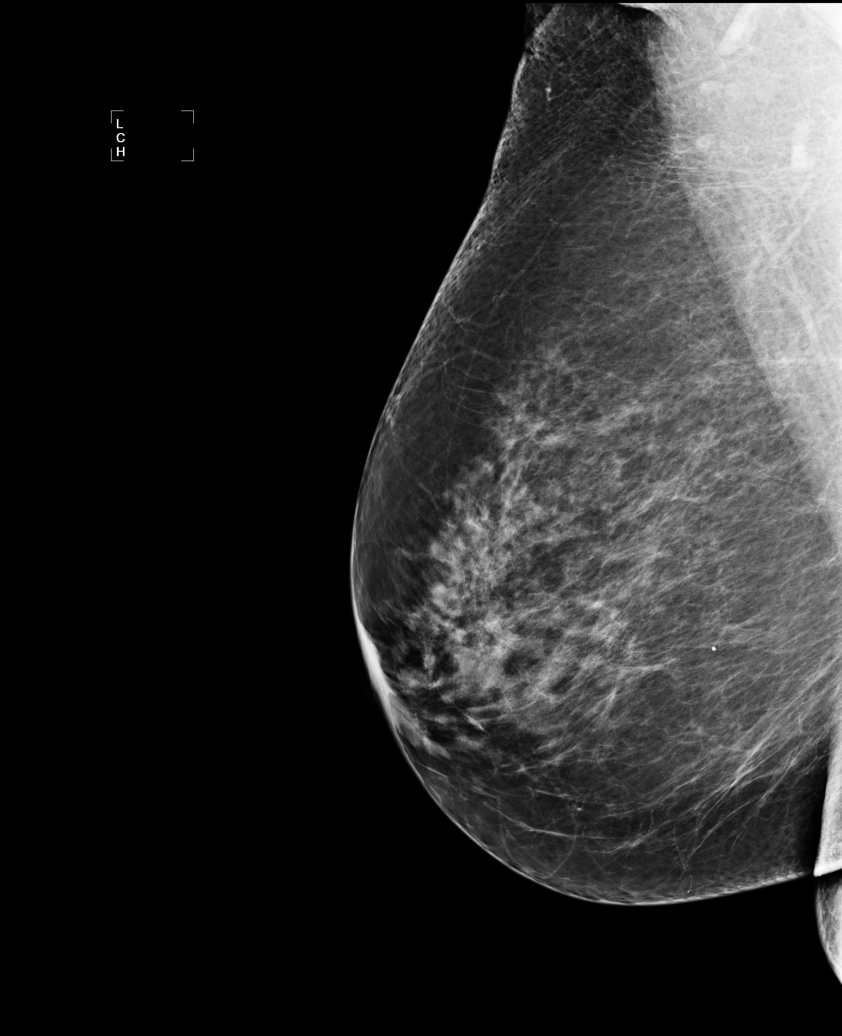

[L CC]
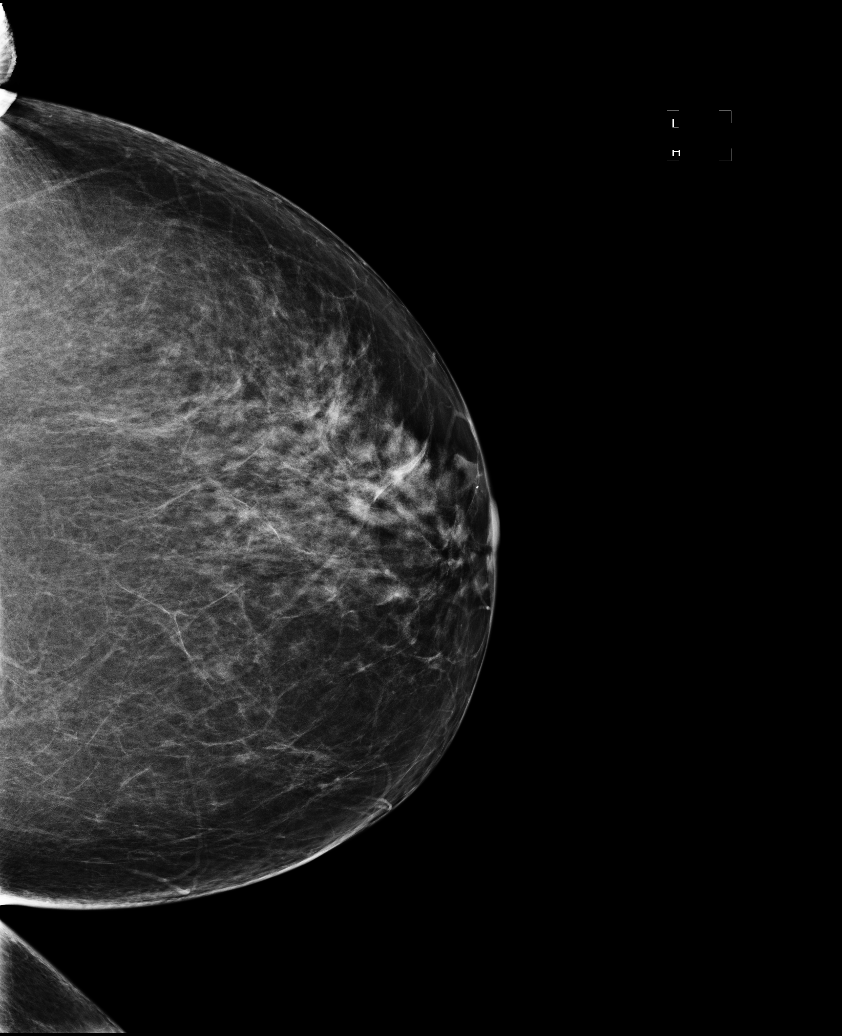

[L MLO]
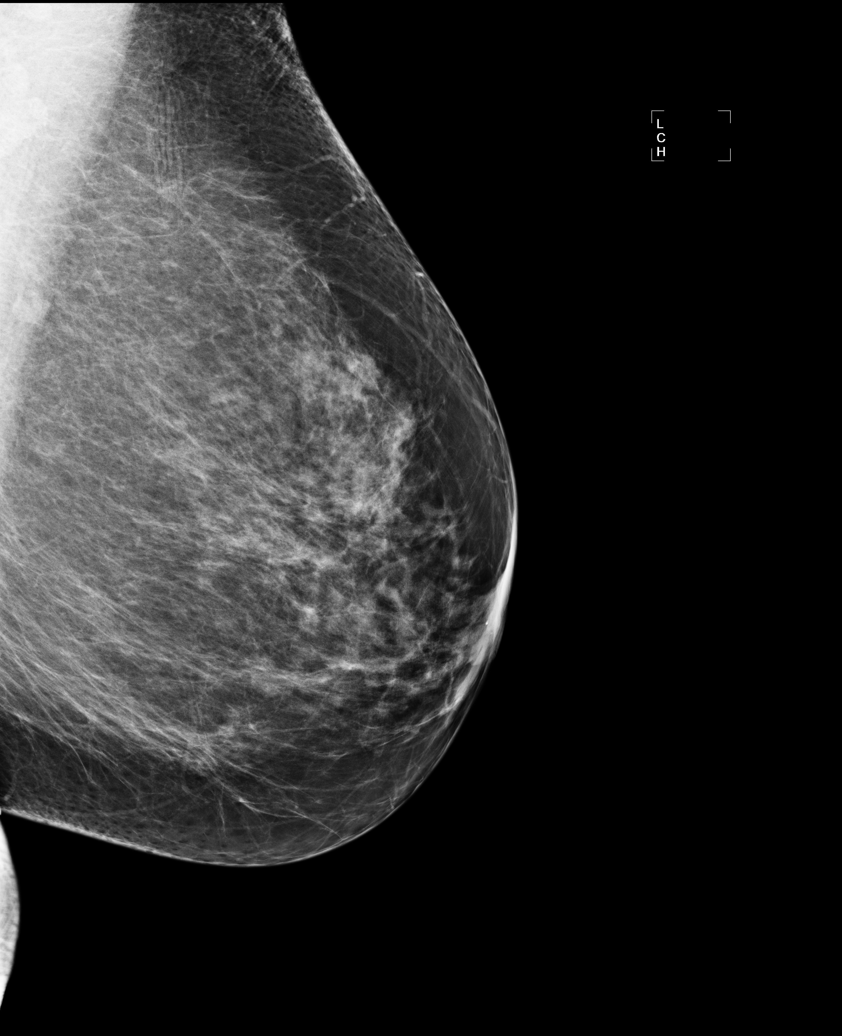

[R MLO (2 of 2)]
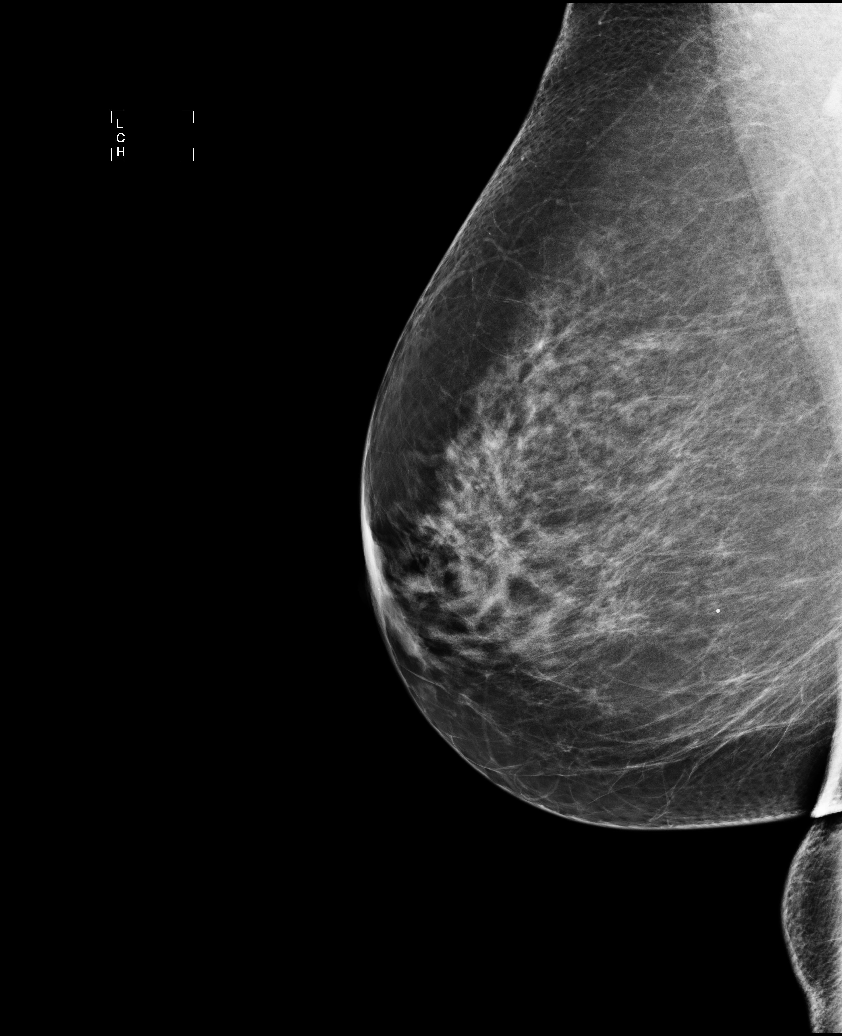

[5 of 5 positions shown; findings below may reference images not displayed]

IMPRESSION: No specific mammographic evidence of malignancy.  Next screening mammogram is recommended in one 
year.

A result letter of this screening mammogram will be mailed directly to the patient.

ASSESSMENT: Negative - BI-RADS 1

Screening mammogram in 1 year.
,

## 2011-04-19 DIAGNOSIS — B9789 Other viral agents as the cause of diseases classified elsewhere: Secondary | ICD-10-CM | POA: Diagnosis not present

## 2011-04-19 DIAGNOSIS — R109 Unspecified abdominal pain: Secondary | ICD-10-CM | POA: Diagnosis not present

## 2011-04-20 DIAGNOSIS — B9789 Other viral agents as the cause of diseases classified elsewhere: Secondary | ICD-10-CM | POA: Diagnosis not present

## 2011-04-23 DIAGNOSIS — D72829 Elevated white blood cell count, unspecified: Secondary | ICD-10-CM | POA: Diagnosis not present

## 2011-04-25 ENCOUNTER — Ambulatory Visit
Admission: RE | Admit: 2011-04-25 | Discharge: 2011-04-25 | Disposition: A | Discharge status: Home / Self Care | Payer: Medicare Other | Source: Ambulatory Visit | Attending: Family Medicine | Admitting: Family Medicine

## 2011-04-25 ENCOUNTER — Other Ambulatory Visit: Payer: Self-pay | Admitting: Family Medicine

## 2011-04-25 DIAGNOSIS — K859 Acute pancreatitis without necrosis or infection, unspecified: Secondary | ICD-10-CM | POA: Diagnosis not present

## 2011-04-25 DIAGNOSIS — R109 Unspecified abdominal pain: Secondary | ICD-10-CM | POA: Diagnosis not present

## 2011-04-25 DIAGNOSIS — K7689 Other specified diseases of liver: Secondary | ICD-10-CM | POA: Diagnosis not present

## 2011-04-25 IMAGING — CT CT ABD-PELV W/ CM
1 of 5 series · 14 of 36 positions shown, 19 images · IV contrast (READICAT/WATER & [ID] OMNI 300)
Comparison: 

CLINICAL DATA: Abdominal pain, elevated white blood cell count

CT ABDOMEN AND PELVIS WITH CONTRAST
TECHNIQUE: Multidetector CT imaging of the abdomen and pelvis was
performed following the standard protocol during bolus
administration of intravenous contrast.
Contrast: 100mL OMNIPAQUE IOHEXOL 300 MG/ML IV SOLN

[Series 2: abd/pelvis with · axial · 0.68mm/px · z∈[-384,+2]mm · 14 of 87 slices shown, 19 images]
[im 5/87  soft-tissue]
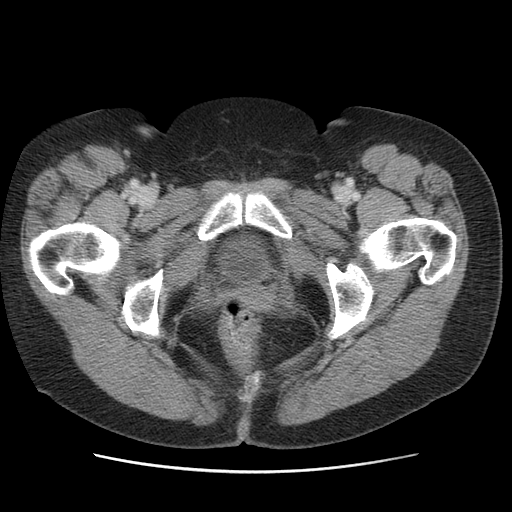
[im 5/87  bone]
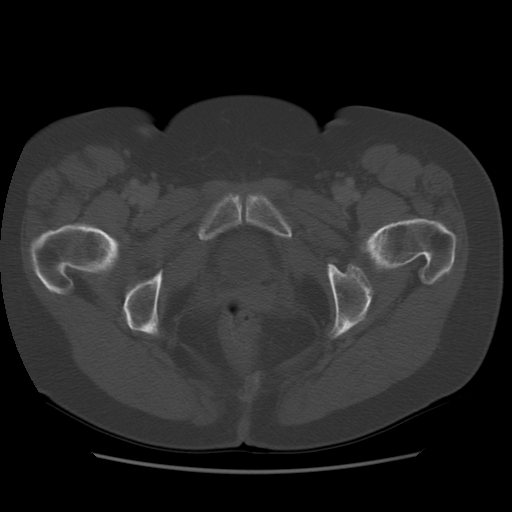
[im 10/87  soft-tissue]
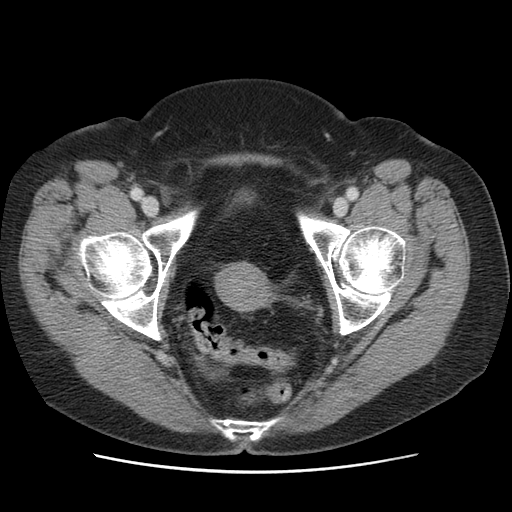
[im 20/87  soft-tissue]
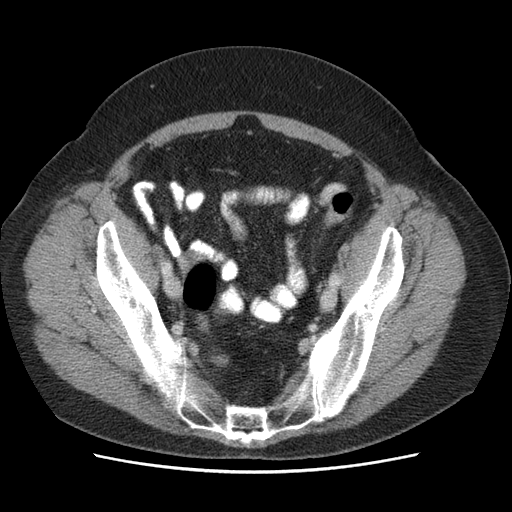
[im 24/87  soft-tissue]
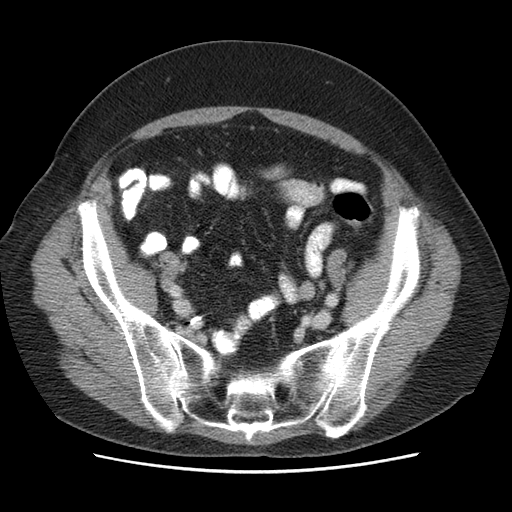
[im 29/87  soft-tissue]
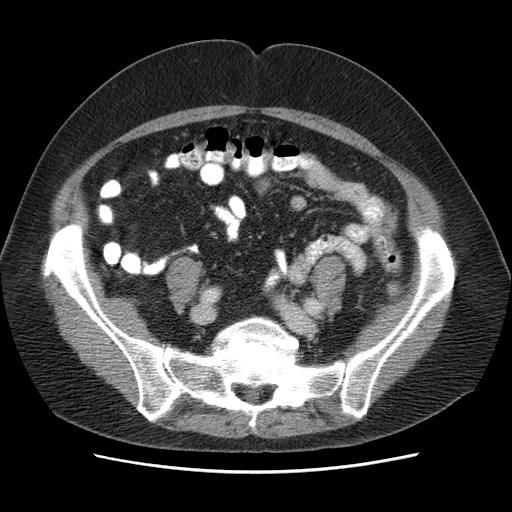
[im 39/87  soft-tissue]
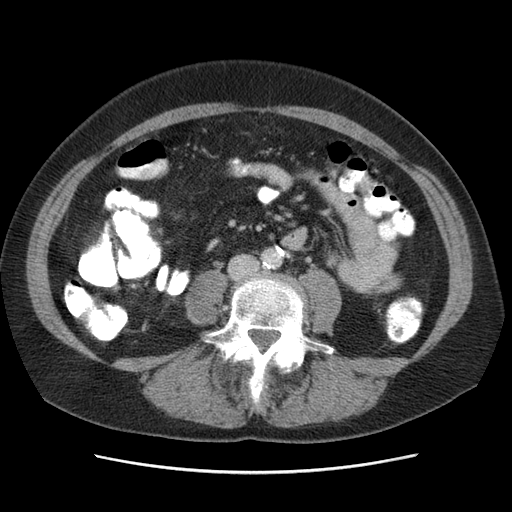
[im 44/87  soft-tissue]
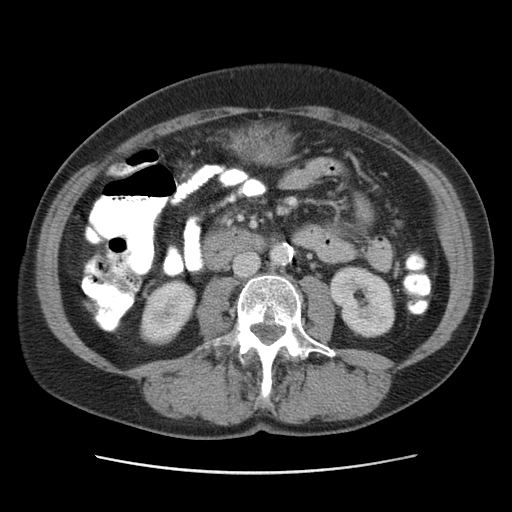
[im 48/87  soft-tissue]
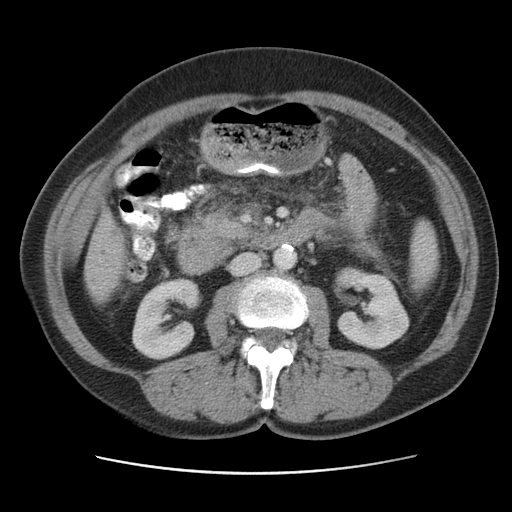
[im 58/87  soft-tissue]
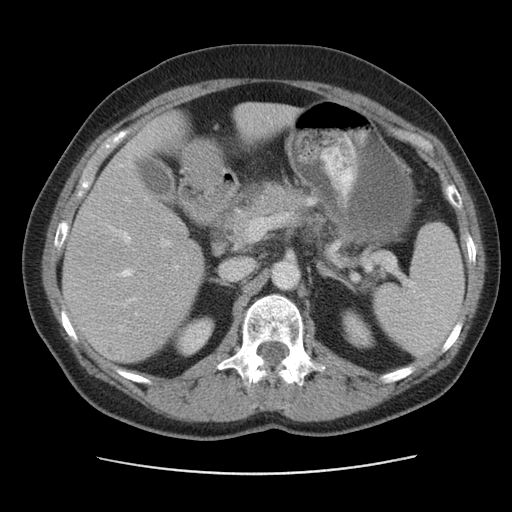
[im 58/87  bone]
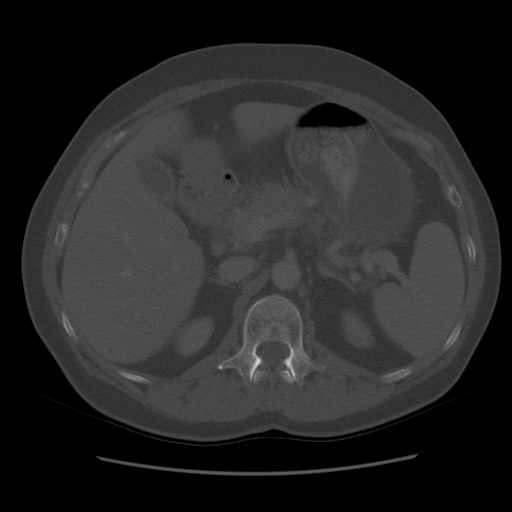
[im 63/87  soft-tissue]
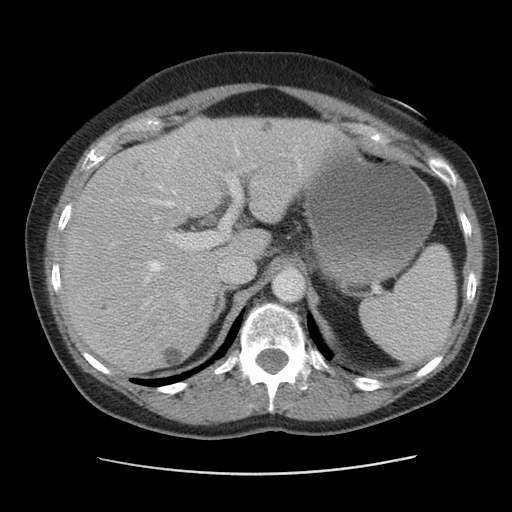
[im 67/87  soft-tissue]
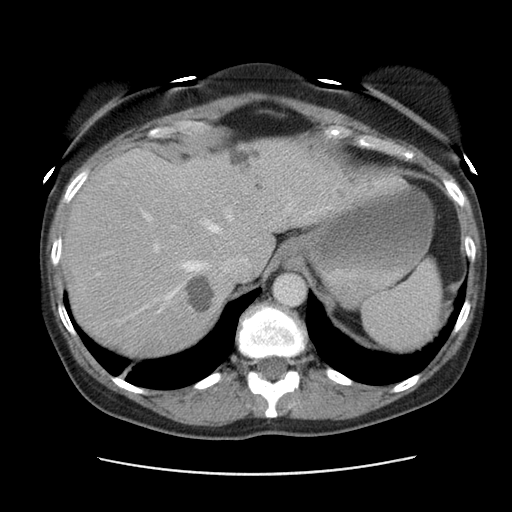
[im 67/87  lung]
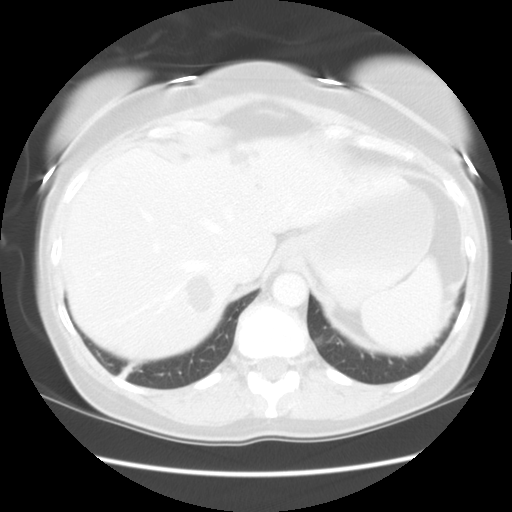
[im 72/87  lung]
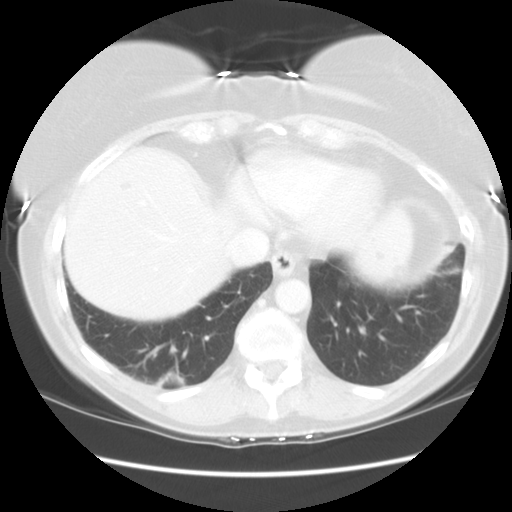
[im 77/87  soft-tissue]
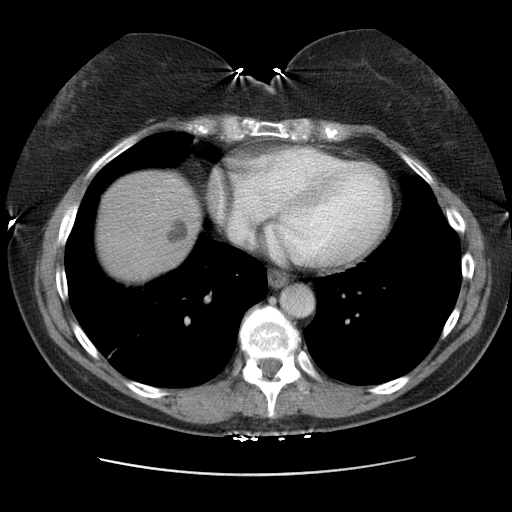
[im 77/87  lung]
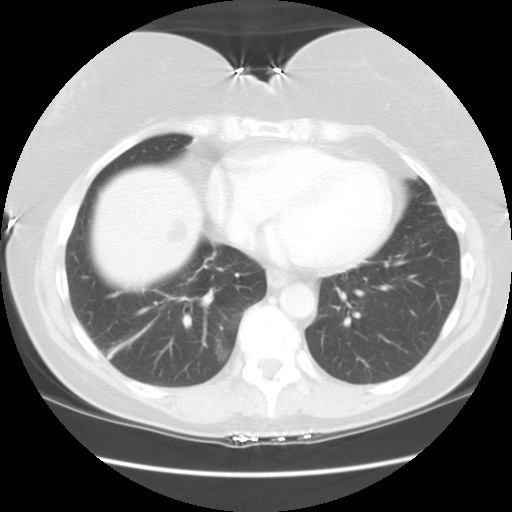
[im 82/87  soft-tissue]
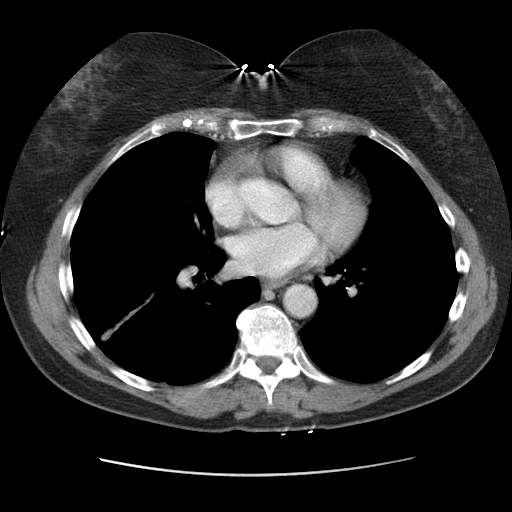
[im 82/87  lung]
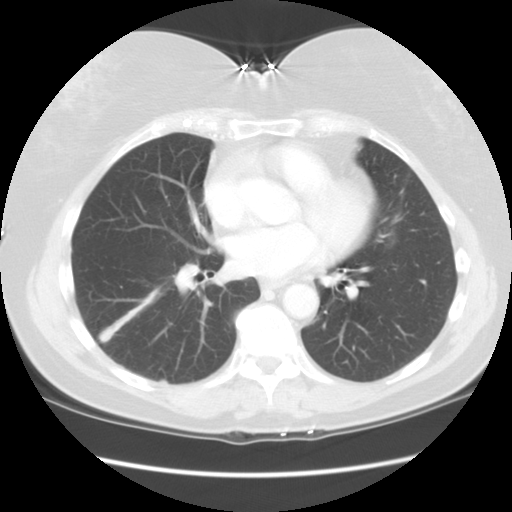

[14 of 36 positions shown; findings below may reference images not displayed]

FINDINGS: There is mild linear scarring at the lung bases.  No
pleural fluid.  No pericardial fluid.

Multiple low-density cystic lesions within the liver.  The largest
of these lesions have simple fluid attenuation.  Smaller lesions
too small to characterize.  They all likely represent benign cyst.
Gallbladder is contracted.  No radiodense gallstones are present.
The common bile duct is normal.  No intrahepatic biliary duct
dilatation.

There is a peripancreatic fat stranding surrounding the uncinate
process and head of the pancreas.  There is thickening along the
left anterior pararenal fascia.  There are low density regions
within the pancreatic head and body.  All these findings indicate
acute pancreatitis.  There is no evidence of vascular complication.
There is no organized fluid collection.

The spleen and splenic vein are normal.  The adrenal glands and
kidneys are normal.

The stomach, small bowel, and colon are normal.

Abdominal aorta normal caliber.  No retroperitoneal
lymphadenopathy.

No free fluid the pelvis.  The uterus and bladder normal.  No
pelvic lymphadenopathy. Review of  bone windows demonstrates no
aggressive osseous lesions.
IMPRESSION: 1..  Acute pancreatitis.

2..  Potential focus of necrosis in the pancreatic head. No
organized fluid collections.

3.  No evidence of common bile duct obstruction.
4.  Multiple hepatic cysts.

Findings conveyed to Dr. NUMME on [DATE] at [1J] hours

## 2011-04-25 MED ORDER — IOHEXOL 300 MG/ML  SOLN
100.0000 mL | Freq: Once | INTRAMUSCULAR | Status: AC | PRN
  Administered 2011-04-25: 100 mL via INTRAVENOUS

## 2011-04-26 DIAGNOSIS — K859 Acute pancreatitis without necrosis or infection, unspecified: Secondary | ICD-10-CM | POA: Diagnosis not present

## 2011-05-23 DIAGNOSIS — I10 Essential (primary) hypertension: Secondary | ICD-10-CM | POA: Diagnosis not present

## 2011-05-23 DIAGNOSIS — E7800 Pure hypercholesterolemia, unspecified: Secondary | ICD-10-CM | POA: Diagnosis not present

## 2011-05-23 DIAGNOSIS — K859 Acute pancreatitis without necrosis or infection, unspecified: Secondary | ICD-10-CM | POA: Diagnosis not present

## 2011-05-23 DIAGNOSIS — D72829 Elevated white blood cell count, unspecified: Secondary | ICD-10-CM | POA: Diagnosis not present

## 2011-09-19 DIAGNOSIS — I10 Essential (primary) hypertension: Secondary | ICD-10-CM | POA: Diagnosis not present

## 2011-09-19 DIAGNOSIS — Z124 Encounter for screening for malignant neoplasm of cervix: Secondary | ICD-10-CM | POA: Diagnosis not present

## 2011-09-19 DIAGNOSIS — Z0000 Encounter for general adult medical examination without abnormal findings: Secondary | ICD-10-CM | POA: Diagnosis not present

## 2011-09-19 DIAGNOSIS — E7800 Pure hypercholesterolemia, unspecified: Secondary | ICD-10-CM | POA: Diagnosis not present

## 2011-09-19 DIAGNOSIS — E039 Hypothyroidism, unspecified: Secondary | ICD-10-CM | POA: Diagnosis not present

## 2011-12-18 ENCOUNTER — Other Ambulatory Visit (HOSPITAL_COMMUNITY): Payer: Self-pay | Admitting: Family Medicine

## 2011-12-18 DIAGNOSIS — Z1231 Encounter for screening mammogram for malignant neoplasm of breast: Secondary | ICD-10-CM

## 2012-01-15 DIAGNOSIS — Z23 Encounter for immunization: Secondary | ICD-10-CM | POA: Diagnosis not present

## 2012-01-23 ENCOUNTER — Ambulatory Visit (HOSPITAL_COMMUNITY)
Admission: RE | Admit: 2012-01-23 | Discharge: 2012-01-23 | Disposition: A | Discharge status: Home / Self Care | Payer: Medicare Other | Source: Ambulatory Visit | Attending: Family Medicine | Admitting: Family Medicine

## 2012-01-23 DIAGNOSIS — Z1231 Encounter for screening mammogram for malignant neoplasm of breast: Secondary | ICD-10-CM | POA: Insufficient documentation

## 2012-01-23 IMAGING — MG MM DIGITAL SCREENING BILAT
5 series · 5 of 5 positions shown · non-contrast
Comparison: Previous exams.

CLINICAL DATA: Screening.

DIGITAL BILATERAL SCREENING MAMMOGRAM WITH CAD

[R CC]
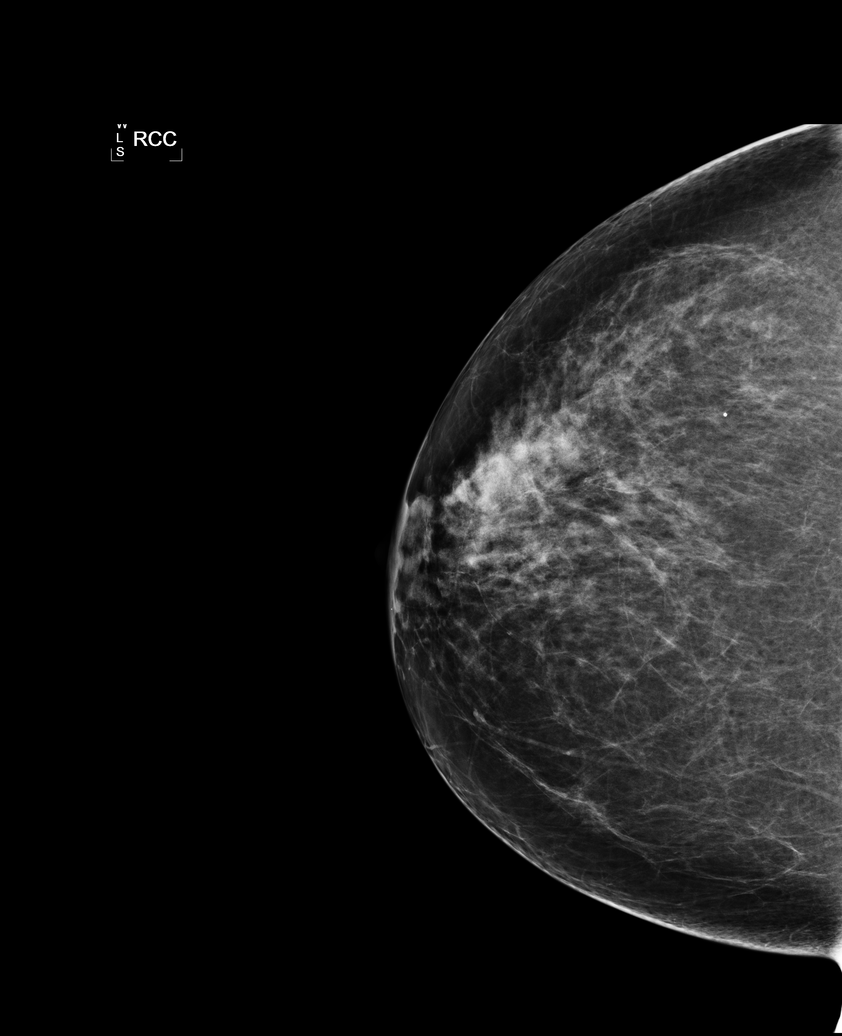

[R MLO]
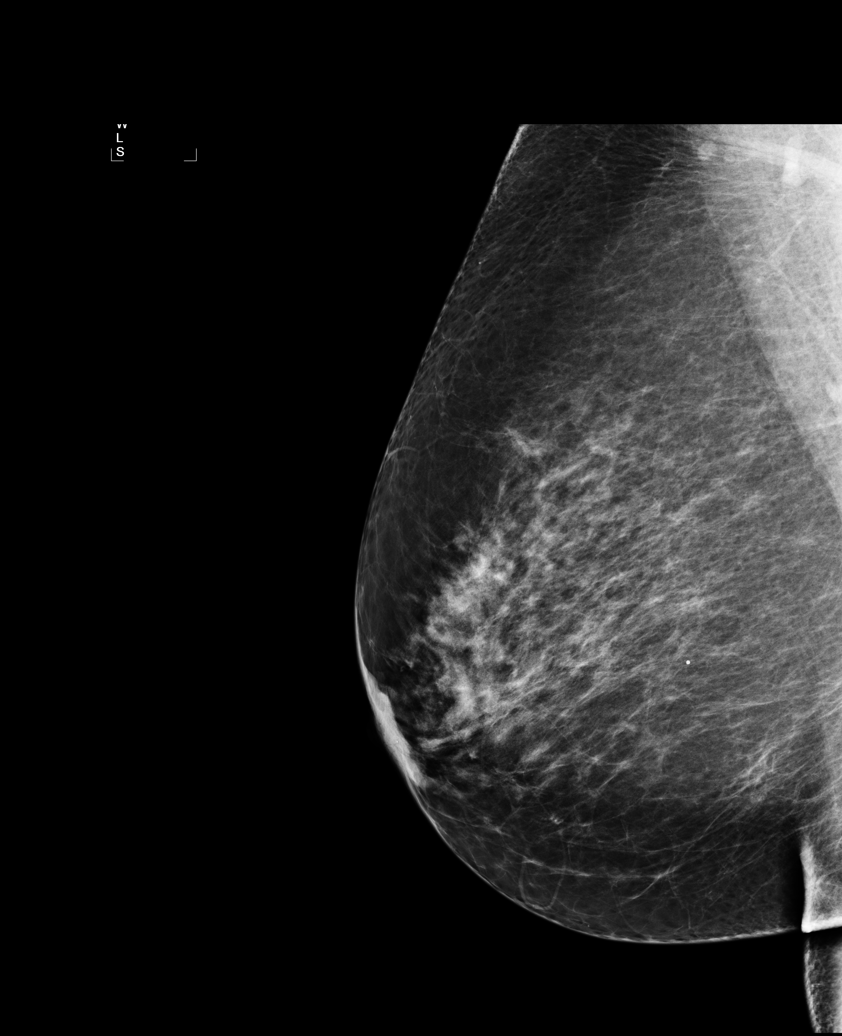

[L CC]
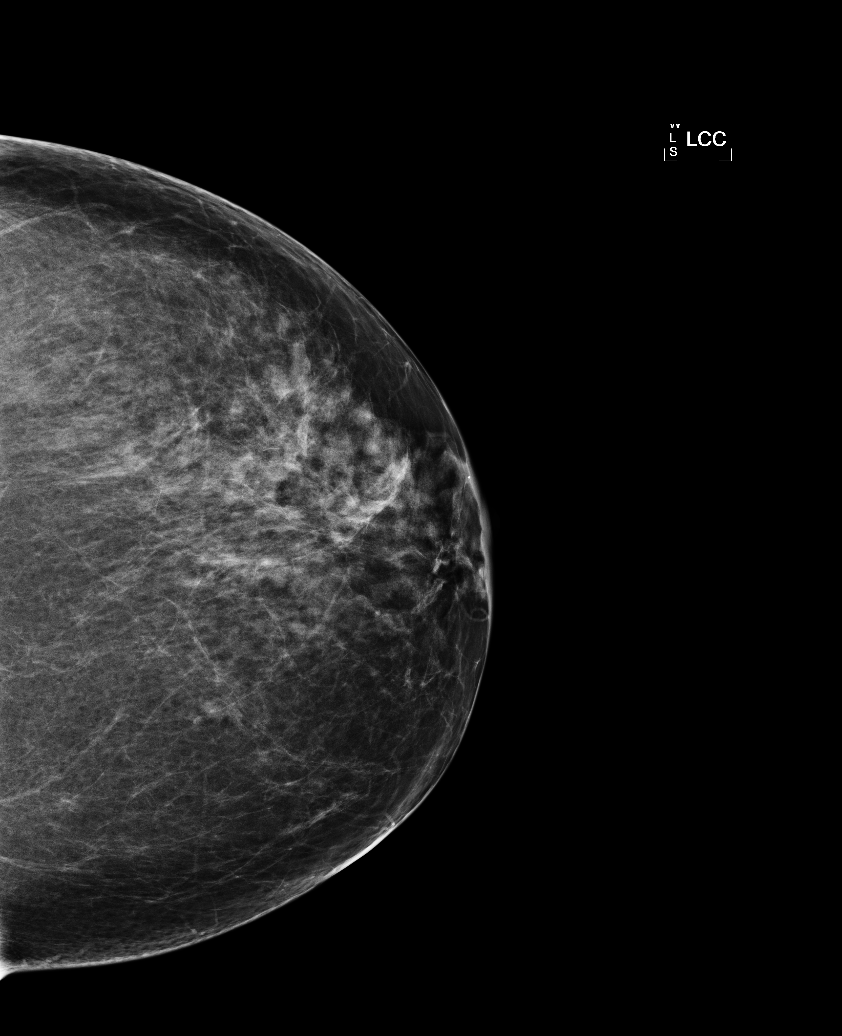

[L MLO (1 of 2)]
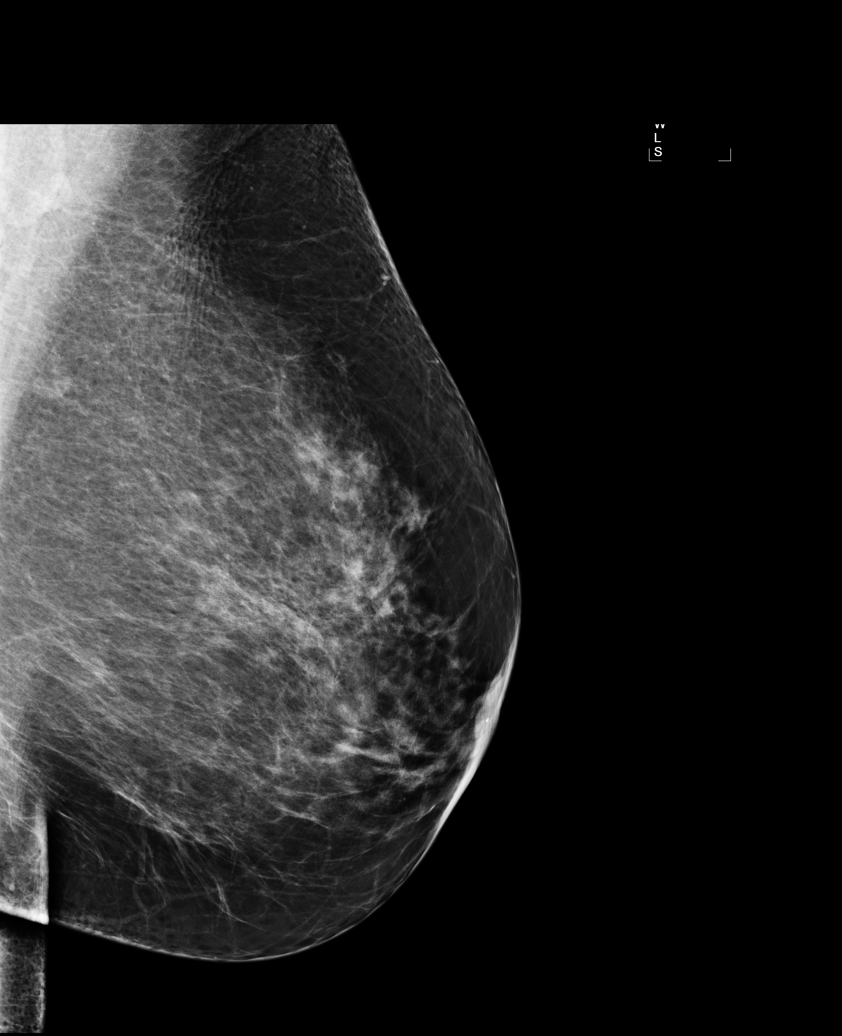

[L MLO (2 of 2)]
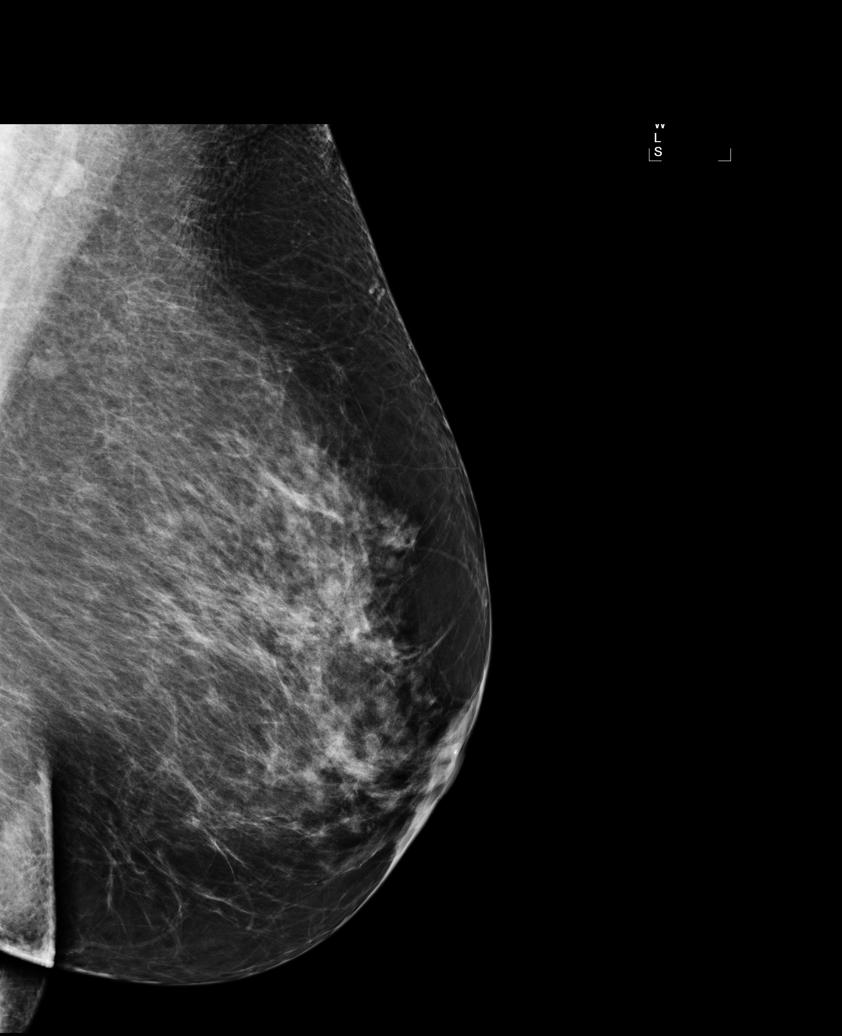

[5 of 5 positions shown; findings below may reference images not displayed]

FINDINGS: There are scattered fibroglandular densities. No
suspicious masses, architectural distortion, or calcifications are
present.

Images were processed with CAD.
IMPRESSION: No mammographic evidence of malignancy.

A result letter of this screening mammogram will be mailed directly
to the patient.

RECOMMENDATION:
Screening mammogram in one year. (Code:[XG])

BI-RADS CATEGORY 1:  Negative.

## 2012-03-26 DIAGNOSIS — I10 Essential (primary) hypertension: Secondary | ICD-10-CM | POA: Diagnosis not present

## 2012-03-26 DIAGNOSIS — E039 Hypothyroidism, unspecified: Secondary | ICD-10-CM | POA: Diagnosis not present

## 2012-03-26 DIAGNOSIS — E7800 Pure hypercholesterolemia, unspecified: Secondary | ICD-10-CM | POA: Diagnosis not present

## 2012-03-26 DIAGNOSIS — J069 Acute upper respiratory infection, unspecified: Secondary | ICD-10-CM | POA: Diagnosis not present

## 2012-04-14 DIAGNOSIS — M949 Disorder of cartilage, unspecified: Secondary | ICD-10-CM | POA: Diagnosis not present

## 2012-04-14 DIAGNOSIS — M899 Disorder of bone, unspecified: Secondary | ICD-10-CM | POA: Diagnosis not present

## 2012-04-23 DIAGNOSIS — M949 Disorder of cartilage, unspecified: Secondary | ICD-10-CM | POA: Diagnosis not present

## 2012-04-23 DIAGNOSIS — M899 Disorder of bone, unspecified: Secondary | ICD-10-CM | POA: Diagnosis not present

## 2012-04-23 DIAGNOSIS — I10 Essential (primary) hypertension: Secondary | ICD-10-CM | POA: Diagnosis not present

## 2012-07-14 DIAGNOSIS — E7800 Pure hypercholesterolemia, unspecified: Secondary | ICD-10-CM | POA: Diagnosis not present

## 2012-07-14 DIAGNOSIS — E039 Hypothyroidism, unspecified: Secondary | ICD-10-CM | POA: Diagnosis not present

## 2012-07-14 DIAGNOSIS — I10 Essential (primary) hypertension: Secondary | ICD-10-CM | POA: Diagnosis not present

## 2012-09-23 DIAGNOSIS — M899 Disorder of bone, unspecified: Secondary | ICD-10-CM | POA: Diagnosis not present

## 2012-09-23 DIAGNOSIS — I10 Essential (primary) hypertension: Secondary | ICD-10-CM | POA: Diagnosis not present

## 2012-09-23 DIAGNOSIS — E7800 Pure hypercholesterolemia, unspecified: Secondary | ICD-10-CM | POA: Diagnosis not present

## 2012-09-23 DIAGNOSIS — M949 Disorder of cartilage, unspecified: Secondary | ICD-10-CM | POA: Diagnosis not present

## 2012-09-23 DIAGNOSIS — Z1331 Encounter for screening for depression: Secondary | ICD-10-CM | POA: Diagnosis not present

## 2012-09-23 DIAGNOSIS — L408 Other psoriasis: Secondary | ICD-10-CM | POA: Diagnosis not present

## 2012-09-23 DIAGNOSIS — E039 Hypothyroidism, unspecified: Secondary | ICD-10-CM | POA: Diagnosis not present

## 2012-09-23 DIAGNOSIS — Z0000 Encounter for general adult medical examination without abnormal findings: Secondary | ICD-10-CM | POA: Diagnosis not present

## 2012-10-14 DIAGNOSIS — E7800 Pure hypercholesterolemia, unspecified: Secondary | ICD-10-CM | POA: Diagnosis not present

## 2012-10-14 DIAGNOSIS — D72829 Elevated white blood cell count, unspecified: Secondary | ICD-10-CM | POA: Diagnosis not present

## 2012-11-18 DIAGNOSIS — D126 Benign neoplasm of colon, unspecified: Secondary | ICD-10-CM | POA: Diagnosis not present

## 2012-11-18 DIAGNOSIS — Z86010 Personal history of colonic polyps: Secondary | ICD-10-CM | POA: Diagnosis not present

## 2012-11-18 DIAGNOSIS — Z09 Encounter for follow-up examination after completed treatment for conditions other than malignant neoplasm: Secondary | ICD-10-CM | POA: Diagnosis not present

## 2012-12-23 ENCOUNTER — Other Ambulatory Visit (HOSPITAL_COMMUNITY): Payer: Self-pay | Admitting: Family Medicine

## 2012-12-23 DIAGNOSIS — Z1231 Encounter for screening mammogram for malignant neoplasm of breast: Secondary | ICD-10-CM

## 2013-01-05 DIAGNOSIS — Z23 Encounter for immunization: Secondary | ICD-10-CM | POA: Diagnosis not present

## 2013-01-23 ENCOUNTER — Ambulatory Visit (HOSPITAL_COMMUNITY)
Admission: RE | Admit: 2013-01-23 | Discharge: 2013-01-23 | Disposition: A | Discharge status: Home / Self Care | Payer: Medicare Other | Source: Ambulatory Visit | Attending: Family Medicine | Admitting: Family Medicine

## 2013-01-23 DIAGNOSIS — Z1231 Encounter for screening mammogram for malignant neoplasm of breast: Secondary | ICD-10-CM | POA: Diagnosis not present

## 2013-03-25 DIAGNOSIS — I10 Essential (primary) hypertension: Secondary | ICD-10-CM | POA: Diagnosis not present

## 2013-03-25 DIAGNOSIS — M899 Disorder of bone, unspecified: Secondary | ICD-10-CM | POA: Diagnosis not present

## 2013-03-25 DIAGNOSIS — E039 Hypothyroidism, unspecified: Secondary | ICD-10-CM | POA: Diagnosis not present

## 2013-03-25 DIAGNOSIS — E7800 Pure hypercholesterolemia, unspecified: Secondary | ICD-10-CM | POA: Diagnosis not present

## 2013-04-27 DIAGNOSIS — R059 Cough, unspecified: Secondary | ICD-10-CM | POA: Diagnosis not present

## 2013-04-27 DIAGNOSIS — R05 Cough: Secondary | ICD-10-CM | POA: Diagnosis not present

## 2013-05-05 ENCOUNTER — Other Ambulatory Visit: Payer: Self-pay | Admitting: Family Medicine

## 2013-05-05 ENCOUNTER — Ambulatory Visit
Admission: RE | Admit: 2013-05-05 | Discharge: 2013-05-05 | Disposition: A | Discharge status: Home / Self Care | Payer: Medicare Other | Source: Ambulatory Visit | Attending: Family Medicine | Admitting: Family Medicine

## 2013-05-05 DIAGNOSIS — R05 Cough: Secondary | ICD-10-CM | POA: Diagnosis not present

## 2013-05-05 DIAGNOSIS — R053 Chronic cough: Secondary | ICD-10-CM

## 2013-05-05 DIAGNOSIS — R059 Cough, unspecified: Secondary | ICD-10-CM | POA: Diagnosis not present

## 2013-05-05 IMAGING — CR DG CHEST 2V
2 series · 2 of 2 positions shown · non-contrast
Comparison: None.

CLINICAL DATA: Cough for 2 weeks

EXAM:
CHEST  2 VIEW

[view not recorded (1 of 2)]
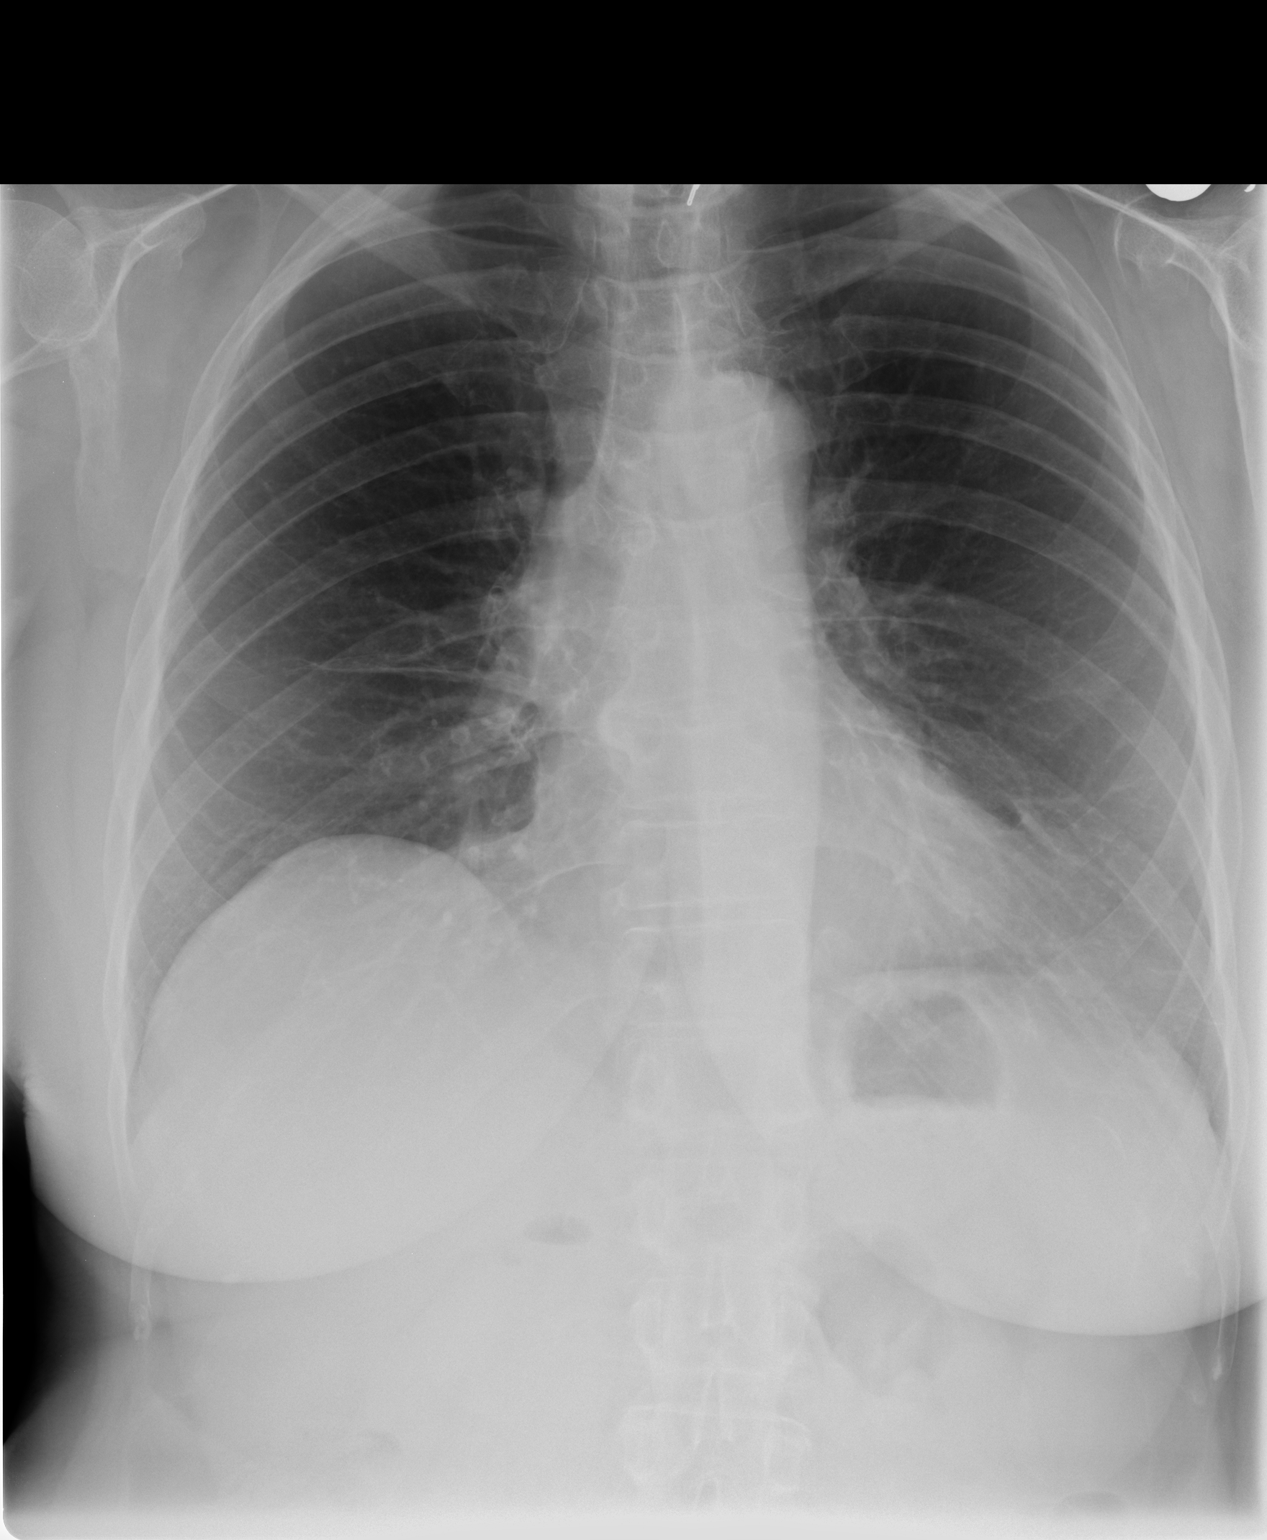

[view not recorded (2 of 2)]
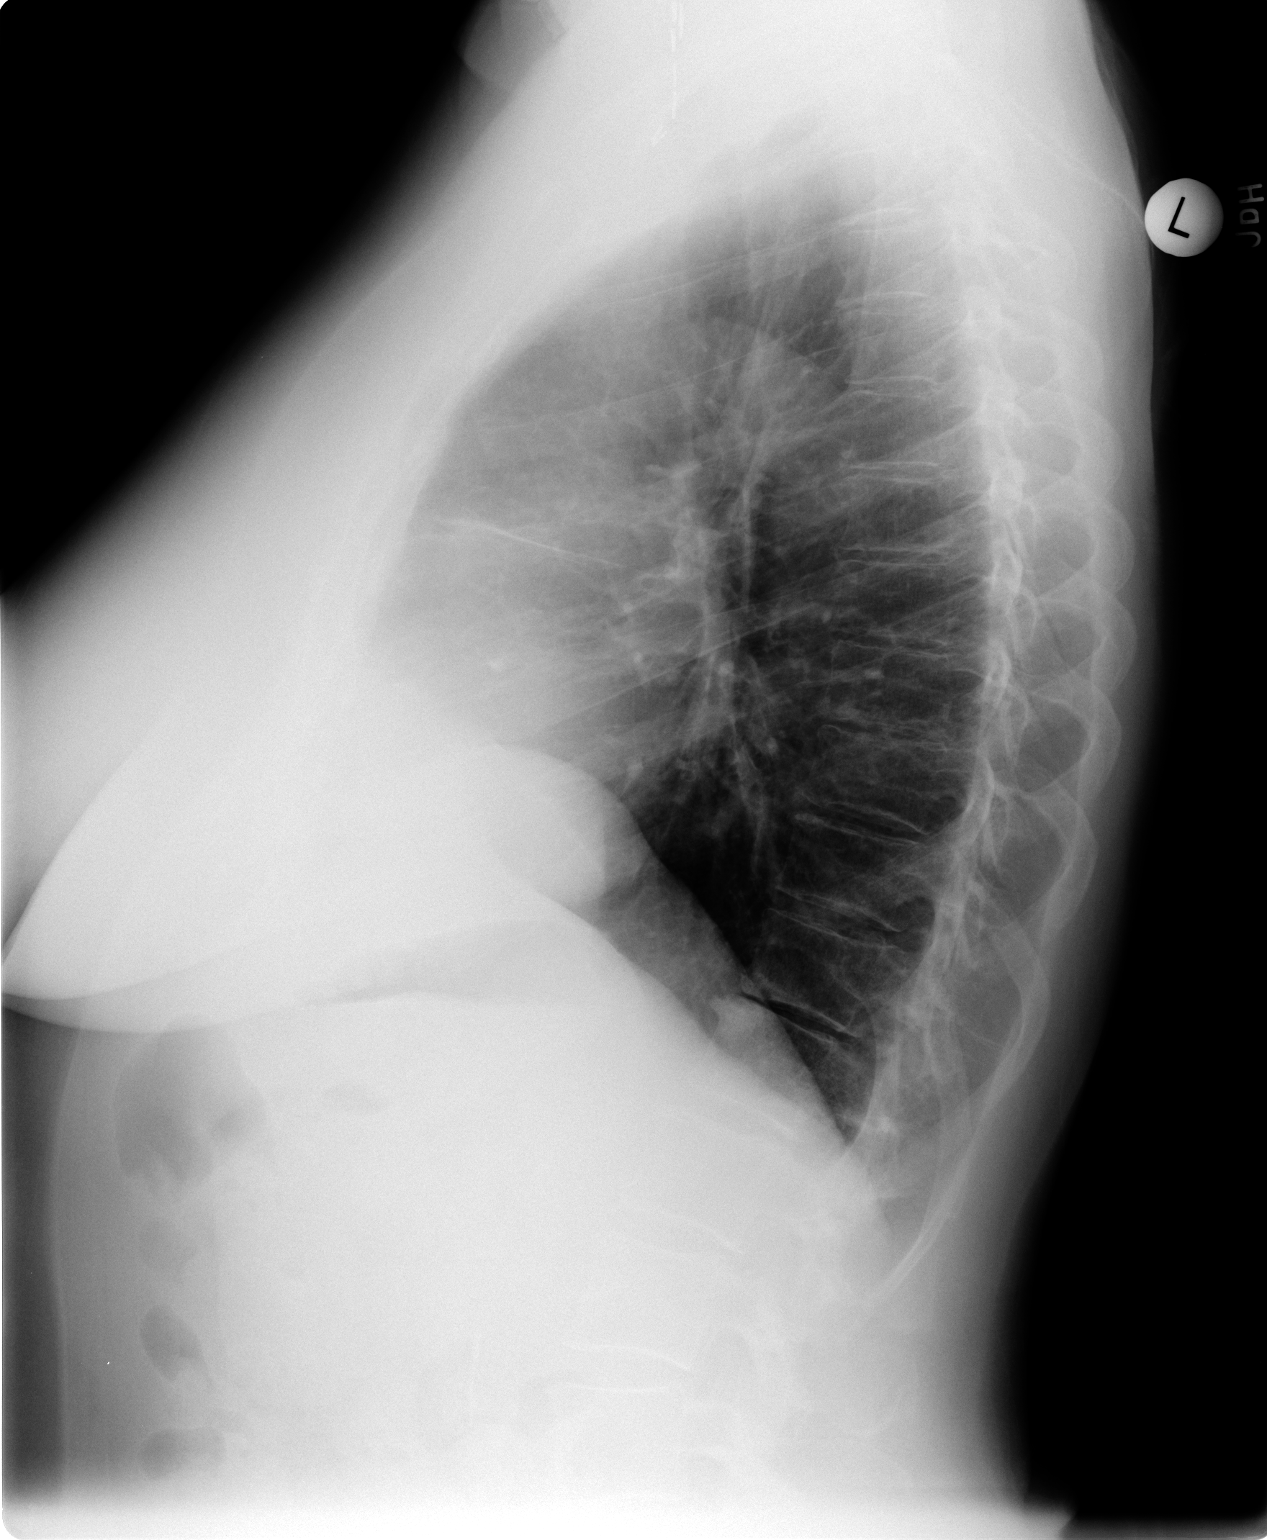

[2 of 2 positions shown; findings below may reference images not displayed]

FINDINGS: The heart size and vascular pattern are normal. There is sharply
defined very mild interstitial opacification in the right mid lung
and left lower lobe. No consolidation or effusion. Mild right
diaphragm elevation. Surgical staples over the cervicothoracic
junction noted anteriorly possibly from prior thyroid surgery.
IMPRESSION: No acute findings. Minimal bilateral parenchymal scarring or
atelectasis.

## 2013-06-03 DIAGNOSIS — Z79899 Other long term (current) drug therapy: Secondary | ICD-10-CM | POA: Diagnosis not present

## 2013-06-03 DIAGNOSIS — E7800 Pure hypercholesterolemia, unspecified: Secondary | ICD-10-CM | POA: Diagnosis not present

## 2013-09-29 DIAGNOSIS — M899 Disorder of bone, unspecified: Secondary | ICD-10-CM | POA: Diagnosis not present

## 2013-09-29 DIAGNOSIS — Z0000 Encounter for general adult medical examination without abnormal findings: Secondary | ICD-10-CM | POA: Diagnosis not present

## 2013-09-29 DIAGNOSIS — E039 Hypothyroidism, unspecified: Secondary | ICD-10-CM | POA: Diagnosis not present

## 2013-09-29 DIAGNOSIS — E7800 Pure hypercholesterolemia, unspecified: Secondary | ICD-10-CM | POA: Diagnosis not present

## 2013-09-29 DIAGNOSIS — Z23 Encounter for immunization: Secondary | ICD-10-CM | POA: Diagnosis not present

## 2013-09-29 DIAGNOSIS — Z1331 Encounter for screening for depression: Secondary | ICD-10-CM | POA: Diagnosis not present

## 2013-09-29 DIAGNOSIS — M949 Disorder of cartilage, unspecified: Secondary | ICD-10-CM | POA: Diagnosis not present

## 2013-09-29 DIAGNOSIS — I10 Essential (primary) hypertension: Secondary | ICD-10-CM | POA: Diagnosis not present

## 2013-10-05 DIAGNOSIS — T148XXA Other injury of unspecified body region, initial encounter: Secondary | ICD-10-CM | POA: Diagnosis not present

## 2013-10-05 DIAGNOSIS — S40029A Contusion of unspecified upper arm, initial encounter: Secondary | ICD-10-CM | POA: Diagnosis not present

## 2013-10-05 DIAGNOSIS — S139XXA Sprain of joints and ligaments of unspecified parts of neck, initial encounter: Secondary | ICD-10-CM | POA: Diagnosis not present

## 2013-12-28 ENCOUNTER — Other Ambulatory Visit (HOSPITAL_COMMUNITY): Payer: Self-pay | Admitting: Family Medicine

## 2013-12-28 DIAGNOSIS — Z1231 Encounter for screening mammogram for malignant neoplasm of breast: Secondary | ICD-10-CM

## 2014-01-12 DIAGNOSIS — Z23 Encounter for immunization: Secondary | ICD-10-CM | POA: Diagnosis not present

## 2014-02-03 ENCOUNTER — Ambulatory Visit (HOSPITAL_COMMUNITY)
Admission: RE | Admit: 2014-02-03 | Discharge: 2014-02-03 | Disposition: A | Discharge status: Home / Self Care | Payer: Medicare Other | Source: Ambulatory Visit | Attending: Family Medicine | Admitting: Family Medicine

## 2014-02-03 DIAGNOSIS — Z1231 Encounter for screening mammogram for malignant neoplasm of breast: Secondary | ICD-10-CM | POA: Diagnosis not present

## 2014-03-24 DIAGNOSIS — I10 Essential (primary) hypertension: Secondary | ICD-10-CM | POA: Diagnosis not present

## 2014-03-24 DIAGNOSIS — E780 Pure hypercholesterolemia: Secondary | ICD-10-CM | POA: Diagnosis not present

## 2014-03-24 DIAGNOSIS — E039 Hypothyroidism, unspecified: Secondary | ICD-10-CM | POA: Diagnosis not present

## 2014-11-11 DIAGNOSIS — Z0000 Encounter for general adult medical examination without abnormal findings: Secondary | ICD-10-CM | POA: Diagnosis not present

## 2014-11-11 DIAGNOSIS — Z1389 Encounter for screening for other disorder: Secondary | ICD-10-CM | POA: Diagnosis not present

## 2014-11-11 DIAGNOSIS — E039 Hypothyroidism, unspecified: Secondary | ICD-10-CM | POA: Diagnosis not present

## 2014-11-11 DIAGNOSIS — N183 Chronic kidney disease, stage 3 (moderate): Secondary | ICD-10-CM | POA: Diagnosis not present

## 2014-11-11 DIAGNOSIS — I10 Essential (primary) hypertension: Secondary | ICD-10-CM | POA: Diagnosis not present

## 2014-11-11 DIAGNOSIS — E780 Pure hypercholesterolemia: Secondary | ICD-10-CM | POA: Diagnosis not present

## 2015-01-03 ENCOUNTER — Other Ambulatory Visit: Payer: Self-pay

## 2015-01-03 DIAGNOSIS — Z1231 Encounter for screening mammogram for malignant neoplasm of breast: Secondary | ICD-10-CM

## 2015-01-07 DIAGNOSIS — Z23 Encounter for immunization: Secondary | ICD-10-CM | POA: Diagnosis not present

## 2015-02-09 ENCOUNTER — Ambulatory Visit
Admission: RE | Admit: 2015-02-09 | Discharge: 2015-02-09 | Disposition: A | Discharge status: Home / Self Care | Payer: Medicare Other | Source: Ambulatory Visit

## 2015-02-09 DIAGNOSIS — Z1231 Encounter for screening mammogram for malignant neoplasm of breast: Secondary | ICD-10-CM

## 2015-05-16 DIAGNOSIS — I10 Essential (primary) hypertension: Secondary | ICD-10-CM | POA: Diagnosis not present

## 2015-05-16 DIAGNOSIS — N183 Chronic kidney disease, stage 3 (moderate): Secondary | ICD-10-CM | POA: Diagnosis not present

## 2015-05-16 DIAGNOSIS — E7800 Pure hypercholesterolemia, unspecified: Secondary | ICD-10-CM | POA: Diagnosis not present

## 2015-05-16 DIAGNOSIS — E039 Hypothyroidism, unspecified: Secondary | ICD-10-CM | POA: Diagnosis not present

## 2015-07-28 DIAGNOSIS — H2513 Age-related nuclear cataract, bilateral: Secondary | ICD-10-CM | POA: Diagnosis not present

## 2015-07-28 DIAGNOSIS — H25013 Cortical age-related cataract, bilateral: Secondary | ICD-10-CM | POA: Diagnosis not present

## 2015-08-16 DIAGNOSIS — H25812 Combined forms of age-related cataract, left eye: Secondary | ICD-10-CM | POA: Diagnosis not present

## 2015-08-16 DIAGNOSIS — H25012 Cortical age-related cataract, left eye: Secondary | ICD-10-CM | POA: Diagnosis not present

## 2015-11-16 DIAGNOSIS — E7800 Pure hypercholesterolemia, unspecified: Secondary | ICD-10-CM | POA: Diagnosis not present

## 2015-11-16 DIAGNOSIS — Z1389 Encounter for screening for other disorder: Secondary | ICD-10-CM | POA: Diagnosis not present

## 2015-11-16 DIAGNOSIS — N183 Chronic kidney disease, stage 3 (moderate): Secondary | ICD-10-CM | POA: Diagnosis not present

## 2015-11-16 DIAGNOSIS — I10 Essential (primary) hypertension: Secondary | ICD-10-CM | POA: Diagnosis not present

## 2015-11-16 DIAGNOSIS — Z0000 Encounter for general adult medical examination without abnormal findings: Secondary | ICD-10-CM | POA: Diagnosis not present

## 2015-11-16 DIAGNOSIS — Z1211 Encounter for screening for malignant neoplasm of colon: Secondary | ICD-10-CM | POA: Diagnosis not present

## 2015-11-16 DIAGNOSIS — E039 Hypothyroidism, unspecified: Secondary | ICD-10-CM | POA: Diagnosis not present

## 2016-01-02 DIAGNOSIS — D122 Benign neoplasm of ascending colon: Secondary | ICD-10-CM | POA: Diagnosis not present

## 2016-01-02 DIAGNOSIS — Z86010 Personal history of colonic polyps: Secondary | ICD-10-CM | POA: Diagnosis not present

## 2016-01-02 DIAGNOSIS — D123 Benign neoplasm of transverse colon: Secondary | ICD-10-CM | POA: Diagnosis not present

## 2016-01-02 DIAGNOSIS — D126 Benign neoplasm of colon, unspecified: Secondary | ICD-10-CM | POA: Diagnosis not present

## 2016-01-02 DIAGNOSIS — D120 Benign neoplasm of cecum: Secondary | ICD-10-CM | POA: Diagnosis not present

## 2016-01-03 DIAGNOSIS — D126 Benign neoplasm of colon, unspecified: Secondary | ICD-10-CM | POA: Diagnosis not present

## 2016-01-06 DIAGNOSIS — Z23 Encounter for immunization: Secondary | ICD-10-CM | POA: Diagnosis not present

## 2016-01-12 ENCOUNTER — Other Ambulatory Visit: Payer: Self-pay | Admitting: Family Medicine

## 2016-01-12 DIAGNOSIS — Z1231 Encounter for screening mammogram for malignant neoplasm of breast: Secondary | ICD-10-CM

## 2016-02-10 ENCOUNTER — Ambulatory Visit
Admission: RE | Admit: 2016-02-10 | Discharge: 2016-02-10 | Disposition: A | Discharge status: Home / Self Care | Payer: Medicare Other | Source: Ambulatory Visit | Attending: Family Medicine | Admitting: Family Medicine

## 2016-02-10 DIAGNOSIS — Z1231 Encounter for screening mammogram for malignant neoplasm of breast: Secondary | ICD-10-CM | POA: Diagnosis not present

## 2016-05-21 DIAGNOSIS — N183 Chronic kidney disease, stage 3 (moderate): Secondary | ICD-10-CM | POA: Diagnosis not present

## 2016-05-21 DIAGNOSIS — I10 Essential (primary) hypertension: Secondary | ICD-10-CM | POA: Diagnosis not present

## 2016-05-21 DIAGNOSIS — E039 Hypothyroidism, unspecified: Secondary | ICD-10-CM | POA: Diagnosis not present

## 2016-05-21 DIAGNOSIS — E7800 Pure hypercholesterolemia, unspecified: Secondary | ICD-10-CM | POA: Diagnosis not present

## 2016-09-20 DIAGNOSIS — Z961 Presence of intraocular lens: Secondary | ICD-10-CM | POA: Diagnosis not present

## 2016-09-20 DIAGNOSIS — H25012 Cortical age-related cataract, left eye: Secondary | ICD-10-CM | POA: Diagnosis not present

## 2016-09-20 DIAGNOSIS — H2511 Age-related nuclear cataract, right eye: Secondary | ICD-10-CM | POA: Diagnosis not present

## 2016-11-22 DIAGNOSIS — Z0000 Encounter for general adult medical examination without abnormal findings: Secondary | ICD-10-CM | POA: Diagnosis not present

## 2016-11-22 DIAGNOSIS — E7800 Pure hypercholesterolemia, unspecified: Secondary | ICD-10-CM | POA: Diagnosis not present

## 2016-11-22 DIAGNOSIS — N183 Chronic kidney disease, stage 3 (moderate): Secondary | ICD-10-CM | POA: Diagnosis not present

## 2016-11-22 DIAGNOSIS — Z1389 Encounter for screening for other disorder: Secondary | ICD-10-CM | POA: Diagnosis not present

## 2016-11-22 DIAGNOSIS — E039 Hypothyroidism, unspecified: Secondary | ICD-10-CM | POA: Diagnosis not present

## 2016-11-22 DIAGNOSIS — I10 Essential (primary) hypertension: Secondary | ICD-10-CM | POA: Diagnosis not present

## 2016-11-22 DIAGNOSIS — M8580 Other specified disorders of bone density and structure, unspecified site: Secondary | ICD-10-CM | POA: Diagnosis not present

## 2016-12-31 ENCOUNTER — Other Ambulatory Visit: Payer: Self-pay | Admitting: Family Medicine

## 2016-12-31 DIAGNOSIS — Z1231 Encounter for screening mammogram for malignant neoplasm of breast: Secondary | ICD-10-CM

## 2017-02-01 DIAGNOSIS — E039 Hypothyroidism, unspecified: Secondary | ICD-10-CM | POA: Diagnosis not present

## 2017-02-01 DIAGNOSIS — Z23 Encounter for immunization: Secondary | ICD-10-CM | POA: Diagnosis not present

## 2017-02-12 ENCOUNTER — Ambulatory Visit
Admission: RE | Admit: 2017-02-12 | Discharge: 2017-02-12 | Disposition: A | Discharge status: Home / Self Care | Payer: Medicare Other | Source: Ambulatory Visit | Attending: Family Medicine | Admitting: Family Medicine

## 2017-02-12 DIAGNOSIS — Z1231 Encounter for screening mammogram for malignant neoplasm of breast: Secondary | ICD-10-CM

## 2017-02-12 IMAGING — MG DIGITAL SCREENING BILATERAL MAMMOGRAM WITH CAD
3 series · 3 of 3 positions shown · non-contrast
Comparison: Previous exam(s).

CLINICAL DATA: Screening.

EXAM:
DIGITAL SCREENING BILATERAL MAMMOGRAM WITH CAD

[R CC]
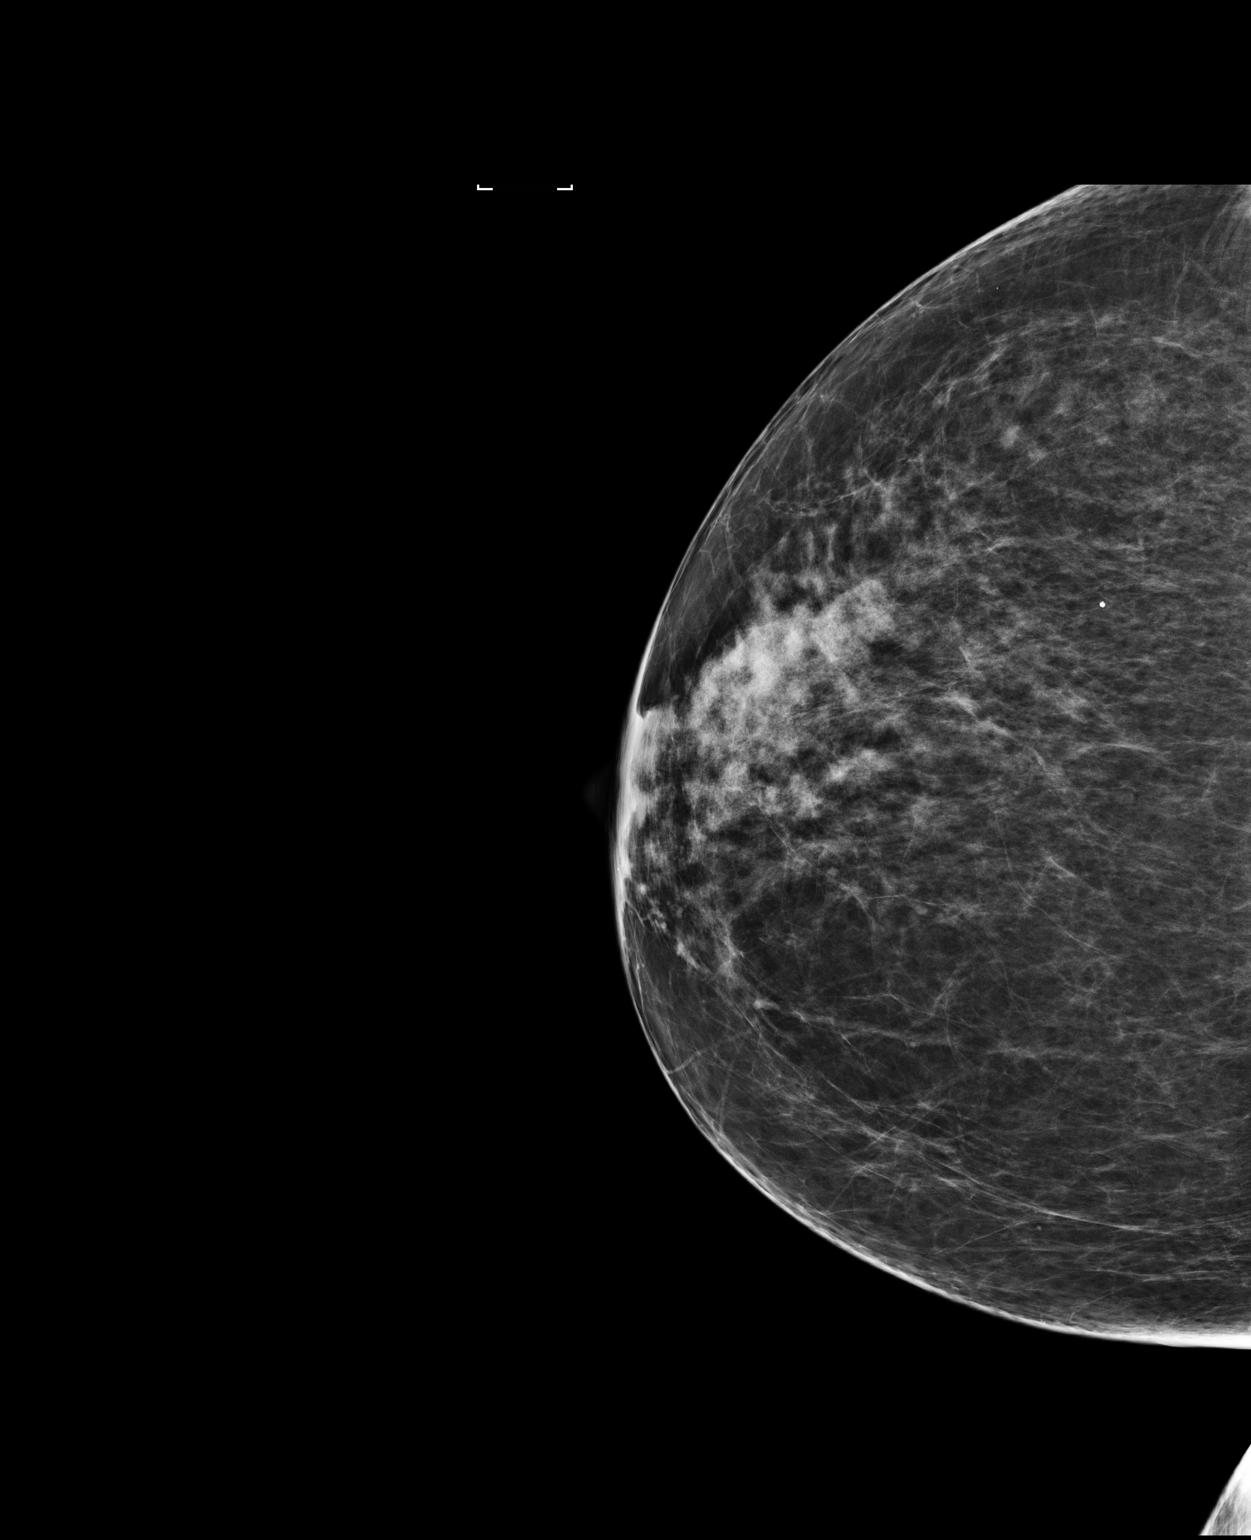

[L MLO]
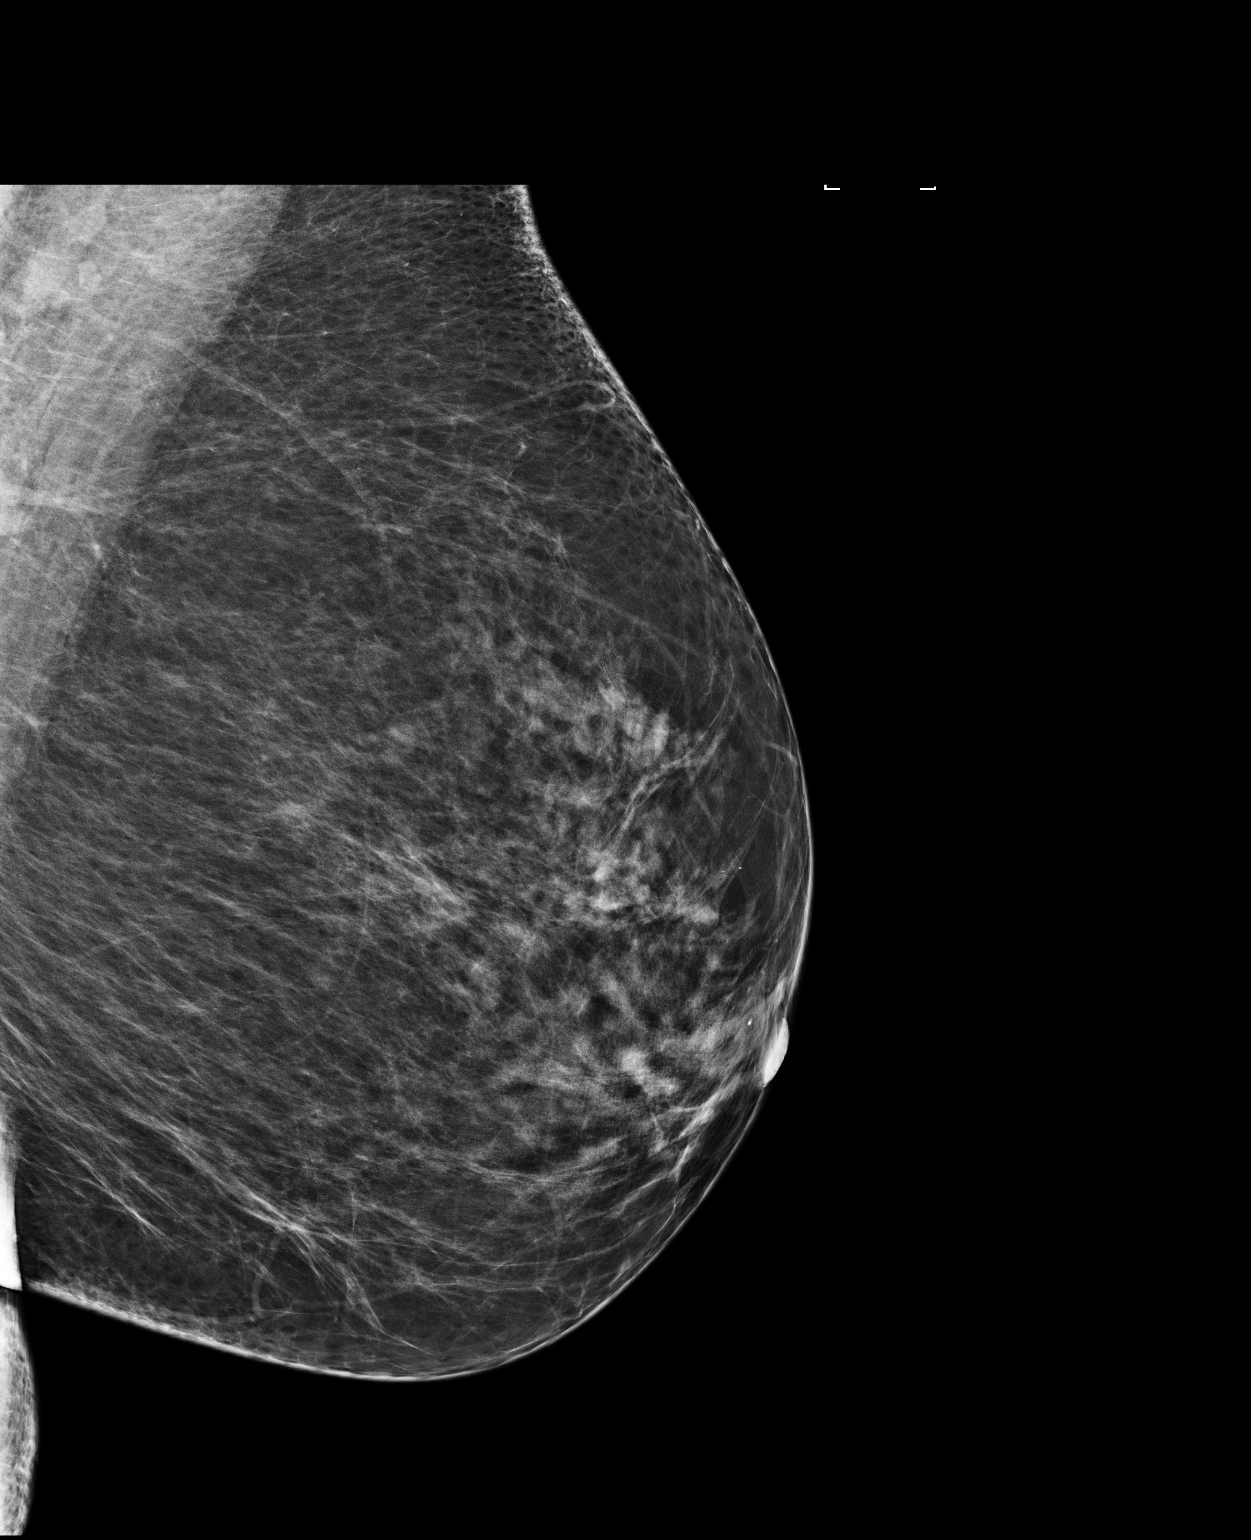

[L CC]
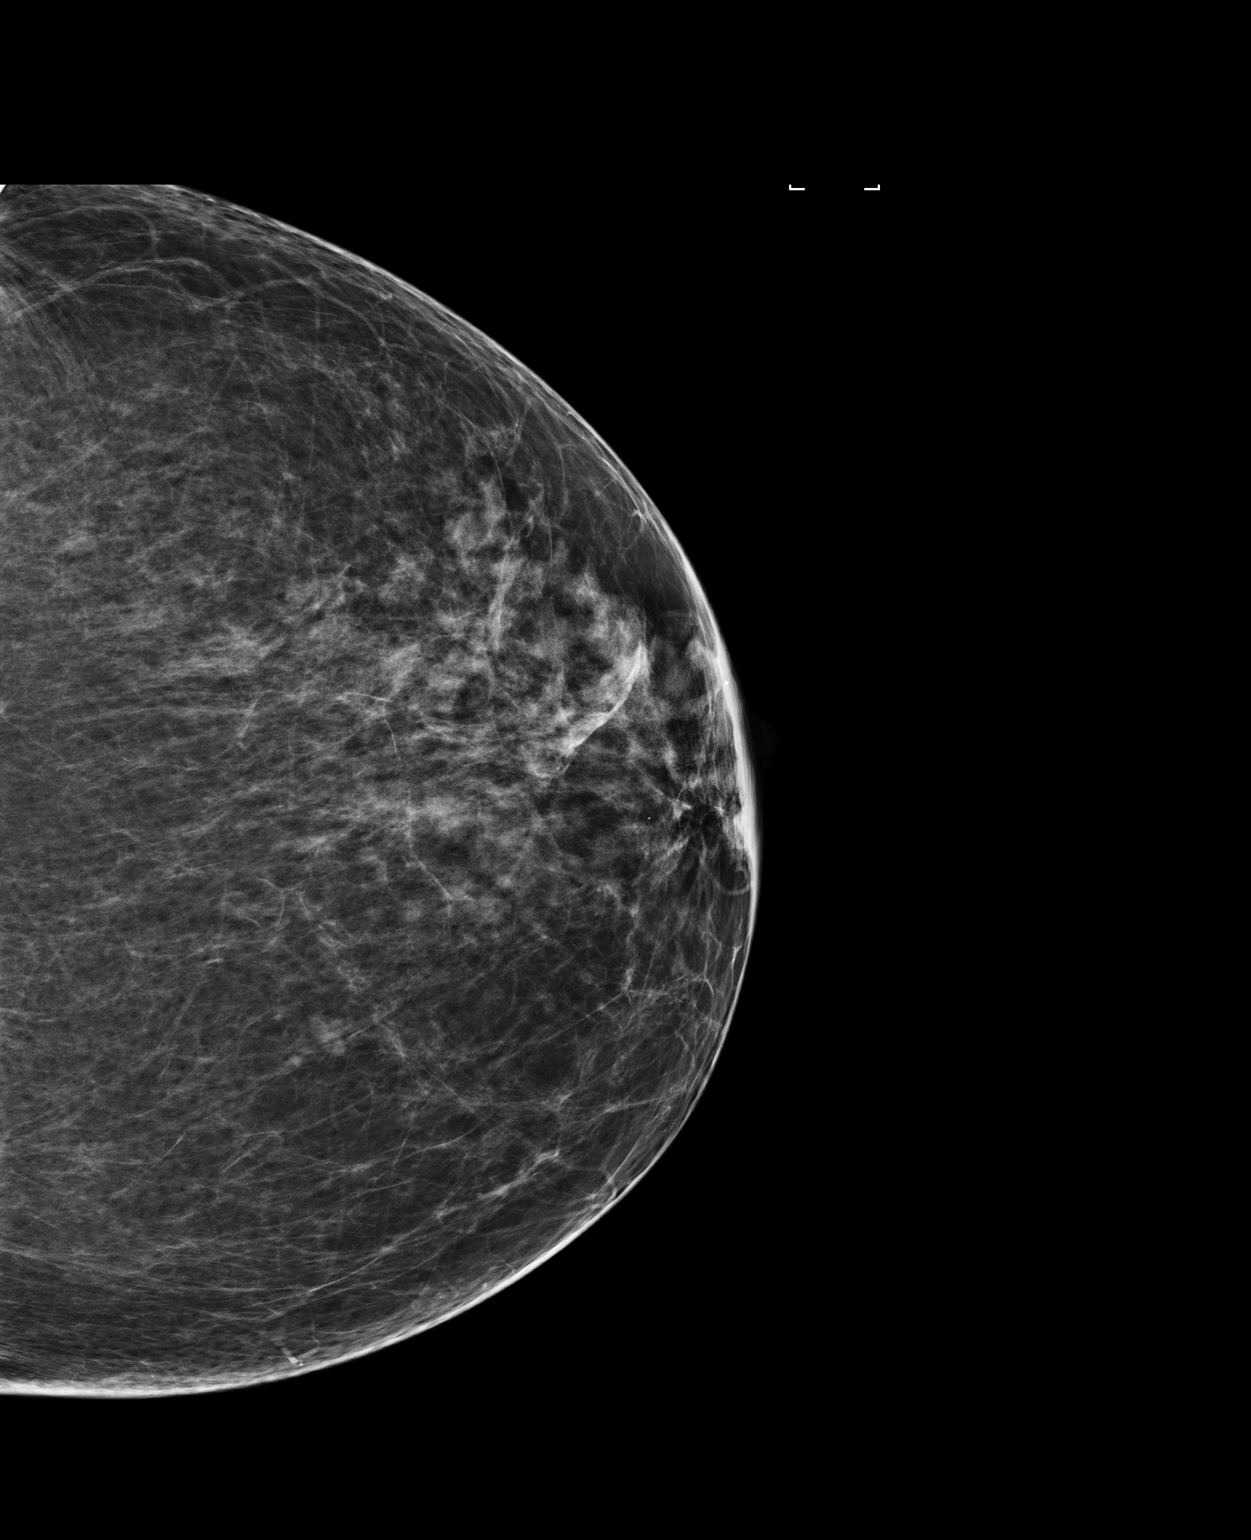

[3 of 3 positions shown; findings below may reference images not displayed]

ACR Breast Density Category b: There are scattered areas of
fibroglandular density.
FINDINGS: There are no findings suspicious for malignancy. Images were
processed with CAD.
IMPRESSION: No mammographic evidence of malignancy. A result letter of this
screening mammogram will be mailed directly to the patient.

RECOMMENDATION:
Screening mammogram in one year. (Code:[US])

BI-RADS CATEGORY  1: Negative.

## 2017-05-27 DIAGNOSIS — E7800 Pure hypercholesterolemia, unspecified: Secondary | ICD-10-CM | POA: Diagnosis not present

## 2017-05-27 DIAGNOSIS — N183 Chronic kidney disease, stage 3 (moderate): Secondary | ICD-10-CM | POA: Diagnosis not present

## 2017-05-27 DIAGNOSIS — E039 Hypothyroidism, unspecified: Secondary | ICD-10-CM | POA: Diagnosis not present

## 2017-05-27 DIAGNOSIS — I10 Essential (primary) hypertension: Secondary | ICD-10-CM | POA: Diagnosis not present

## 2017-09-20 DIAGNOSIS — Z961 Presence of intraocular lens: Secondary | ICD-10-CM | POA: Diagnosis not present

## 2017-09-20 DIAGNOSIS — H25012 Cortical age-related cataract, left eye: Secondary | ICD-10-CM | POA: Diagnosis not present

## 2017-09-20 DIAGNOSIS — H5201 Hypermetropia, right eye: Secondary | ICD-10-CM | POA: Diagnosis not present

## 2017-11-28 DIAGNOSIS — I10 Essential (primary) hypertension: Secondary | ICD-10-CM | POA: Diagnosis not present

## 2017-11-28 DIAGNOSIS — E039 Hypothyroidism, unspecified: Secondary | ICD-10-CM | POA: Diagnosis not present

## 2017-11-28 DIAGNOSIS — N63 Unspecified lump in unspecified breast: Secondary | ICD-10-CM | POA: Diagnosis not present

## 2017-11-28 DIAGNOSIS — Z1389 Encounter for screening for other disorder: Secondary | ICD-10-CM | POA: Diagnosis not present

## 2017-11-28 DIAGNOSIS — Z0000 Encounter for general adult medical examination without abnormal findings: Secondary | ICD-10-CM | POA: Diagnosis not present

## 2017-11-28 DIAGNOSIS — N183 Chronic kidney disease, stage 3 (moderate): Secondary | ICD-10-CM | POA: Diagnosis not present

## 2017-11-28 DIAGNOSIS — E7800 Pure hypercholesterolemia, unspecified: Secondary | ICD-10-CM | POA: Diagnosis not present

## 2017-11-29 ENCOUNTER — Other Ambulatory Visit: Payer: Self-pay | Admitting: Family Medicine

## 2017-11-29 DIAGNOSIS — N6310 Unspecified lump in the right breast, unspecified quadrant: Secondary | ICD-10-CM

## 2017-12-05 ENCOUNTER — Ambulatory Visit
Admission: RE | Admit: 2017-12-05 | Discharge: 2017-12-05 | Disposition: A | Discharge status: Home / Self Care | Payer: Medicare Other | Source: Ambulatory Visit | Attending: Family Medicine | Admitting: Family Medicine

## 2017-12-05 DIAGNOSIS — N6310 Unspecified lump in the right breast, unspecified quadrant: Secondary | ICD-10-CM

## 2017-12-05 DIAGNOSIS — N6489 Other specified disorders of breast: Secondary | ICD-10-CM | POA: Diagnosis not present

## 2017-12-05 DIAGNOSIS — R922 Inconclusive mammogram: Secondary | ICD-10-CM | POA: Diagnosis not present

## 2017-12-05 IMAGING — MG DIGITAL DIAGNOSTIC UNILATERAL RIGHT MAMMOGRAM WITH TOMO AND CAD
4 series · 4 of 12 positions shown · non-contrast
Comparison: Previous exam(s).

CLINICAL DATA: Possible mass palpated on clinical physical
examination in the inferior right breast 2 cm from the nipple. T

EXAM:
DIGITAL DIAGNOSTIC RIGHT MAMMOGRAM WITH CAD AND TOMO
ULTRASOUND RIGHT BREAST

[R MLO synth-2D]
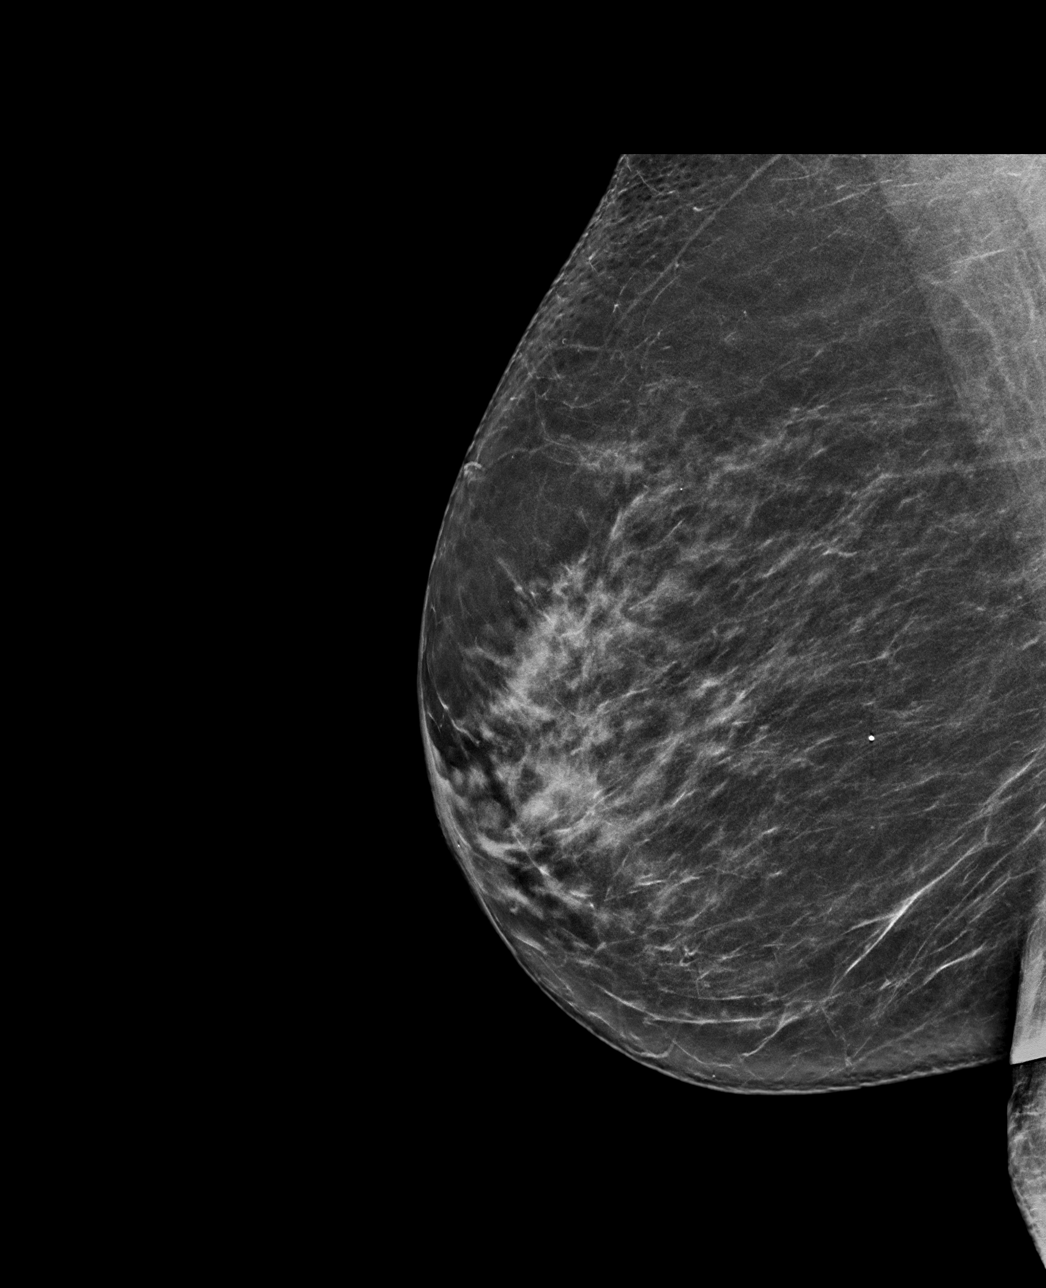

[R CC synth-2D]
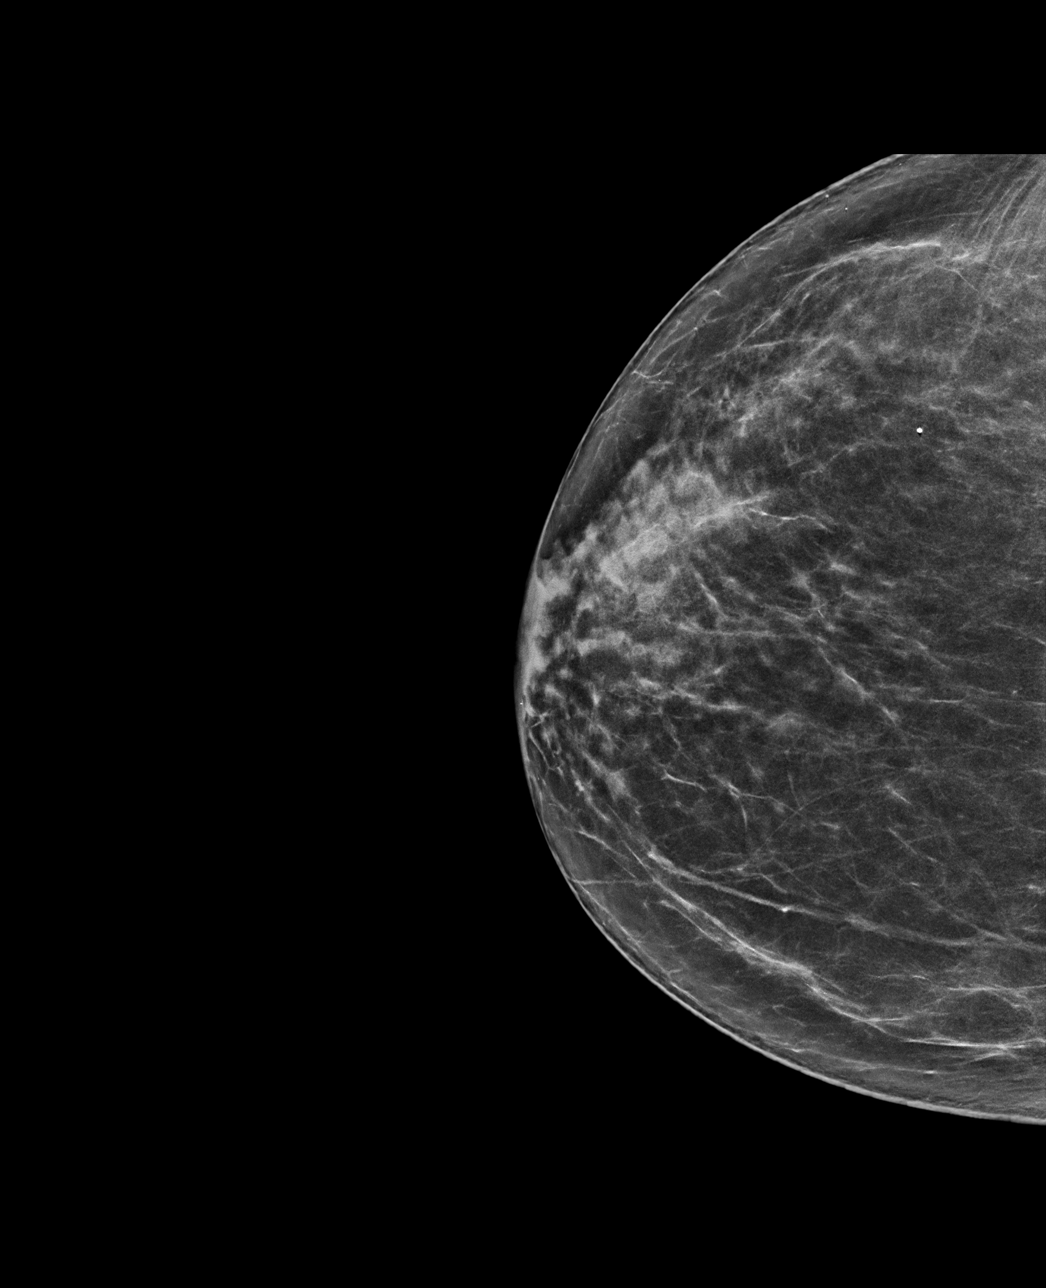

[R CC tomo · tomo slice 38/75.0]
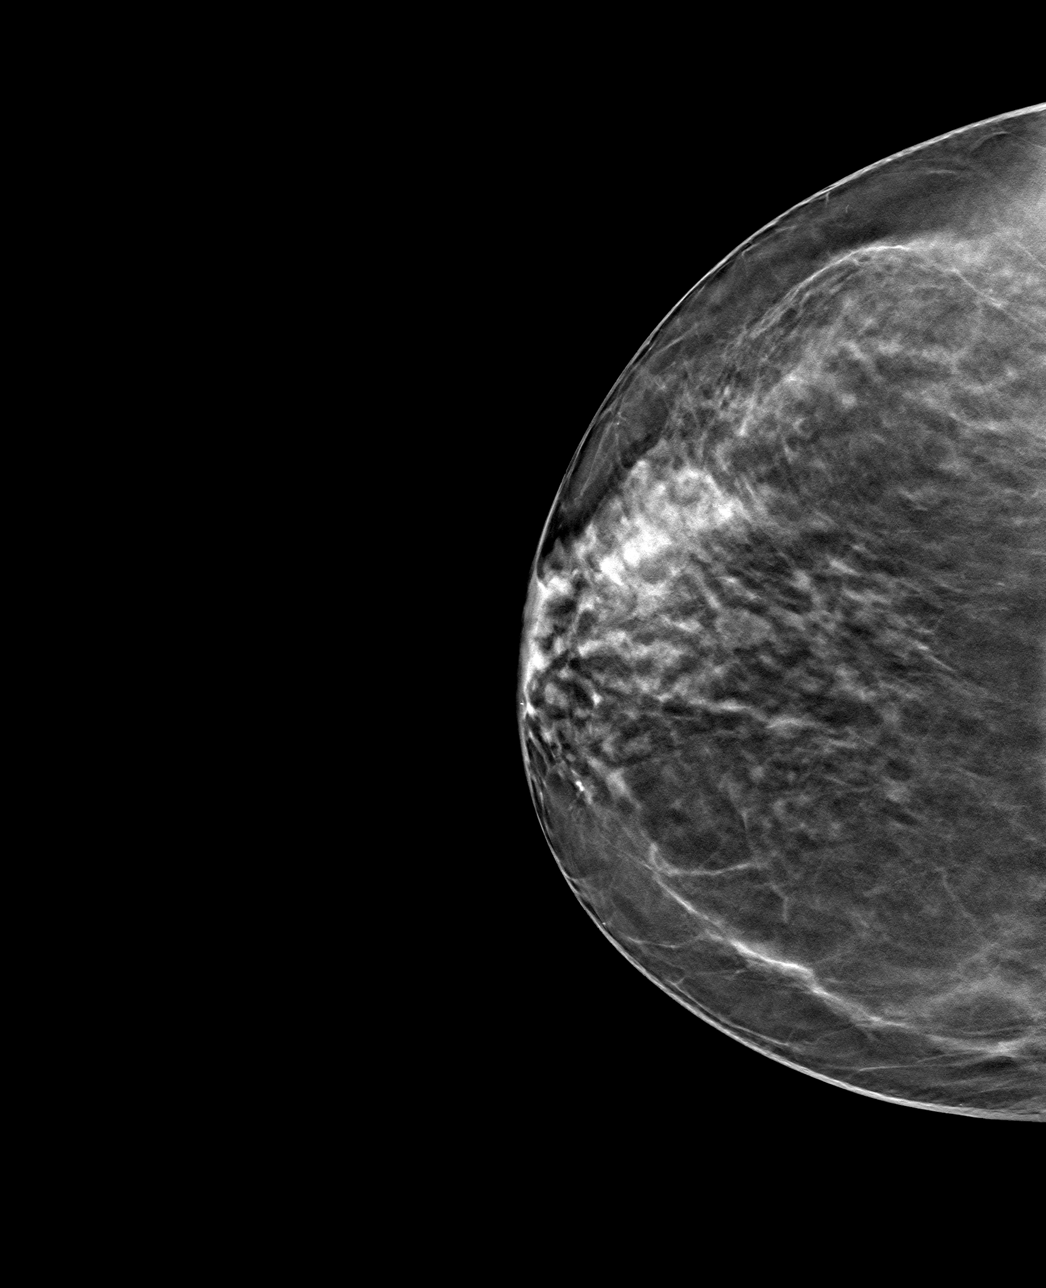

[R MLO tomo · tomo slice 39/77.0]
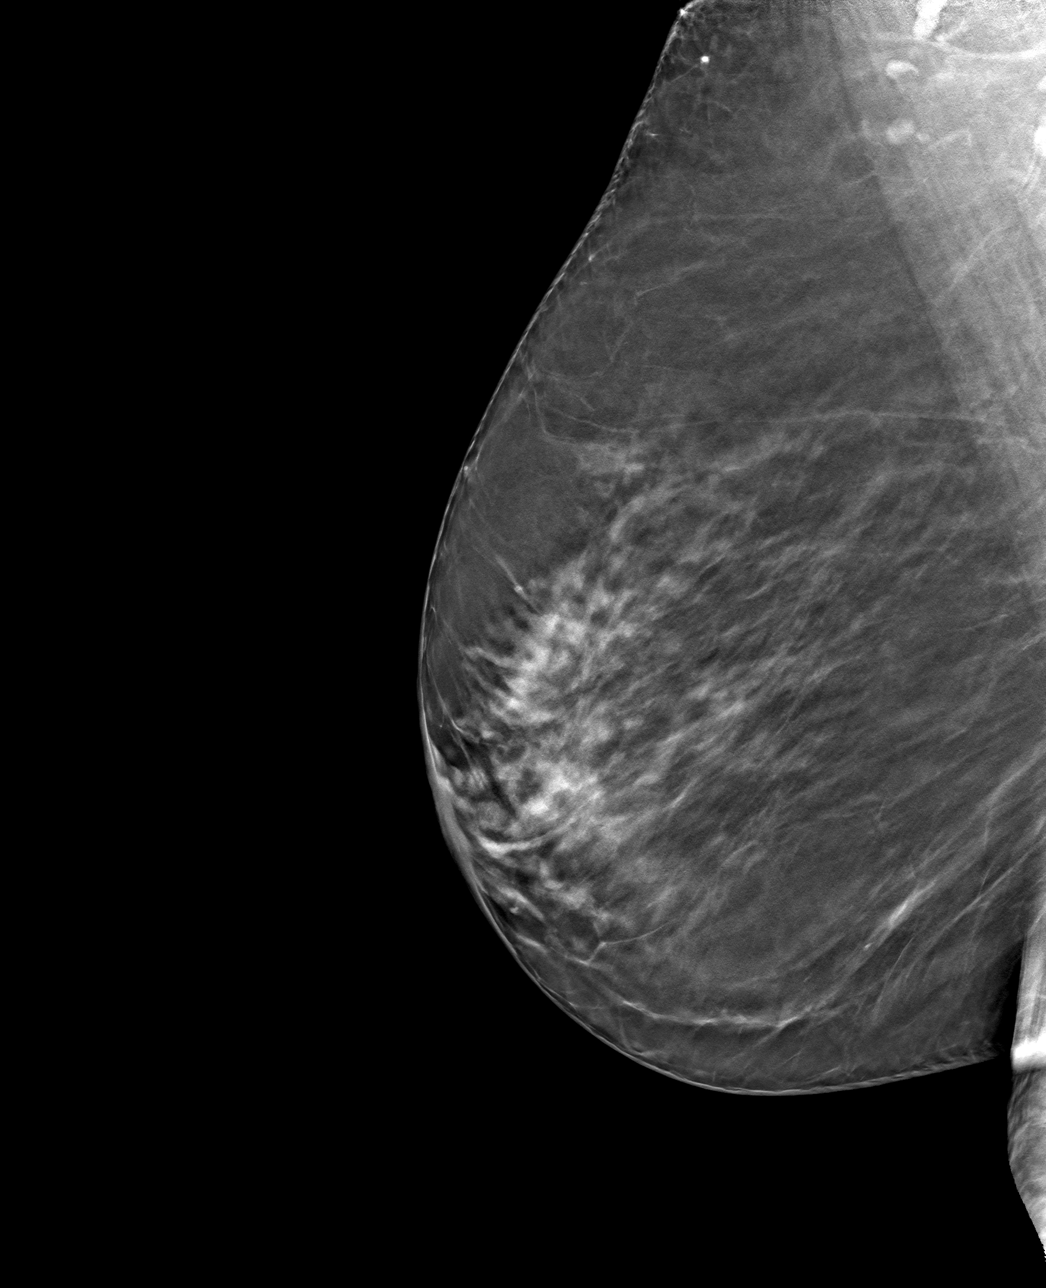

[4 of 12 positions shown; findings below may reference images not displayed]

ACR Breast Density Category c: The breast tissue is heterogeneously
dense, which may obscure small masses.
FINDINGS: No mass, architectural distortion, or suspicious microcalcification
is identified to suggest malignancy in either breast.

Mammographic images were processed with CAD.

On physical exam, I do not palpate a discrete mass in the inferior
right breast on physical exam today.

Targeted ultrasound is performed, showing normal fibroglandular
tissue. No suspicious findings identified on ultrasound.
IMPRESSION: No evidence of malignancy in the right breast.

RECOMMENDATION:
Bilateral screening mammogram is recommended in [DATE].

I have discussed the findings and recommendations with the patient.
Results were also provided in writing at the conclusion of the
visit. If applicable, a reminder letter will be sent to the patient
regarding the next appointment.

BI-RADS CATEGORY  1: Negative.

## 2018-01-01 DIAGNOSIS — Z23 Encounter for immunization: Secondary | ICD-10-CM | POA: Diagnosis not present

## 2018-01-03 ENCOUNTER — Other Ambulatory Visit: Payer: Self-pay | Admitting: Family Medicine

## 2018-01-03 DIAGNOSIS — Z1231 Encounter for screening mammogram for malignant neoplasm of breast: Secondary | ICD-10-CM

## 2018-02-14 ENCOUNTER — Ambulatory Visit
Admission: RE | Admit: 2018-02-14 | Discharge: 2018-02-14 | Disposition: A | Discharge status: Home / Self Care | Payer: Medicare Other | Source: Ambulatory Visit | Attending: Family Medicine | Admitting: Family Medicine

## 2018-02-14 DIAGNOSIS — Z1231 Encounter for screening mammogram for malignant neoplasm of breast: Secondary | ICD-10-CM

## 2018-02-14 IMAGING — MG DIGITAL SCREENING BILATERAL MAMMOGRAM WITH TOMO AND CAD
8 series · 8 of 24 positions shown · non-contrast
Comparison: Previous exam(s).

CLINICAL DATA: Screening.

EXAM:
DIGITAL SCREENING BILATERAL MAMMOGRAM WITH TOMO AND CAD

[L MLO synth-2D]
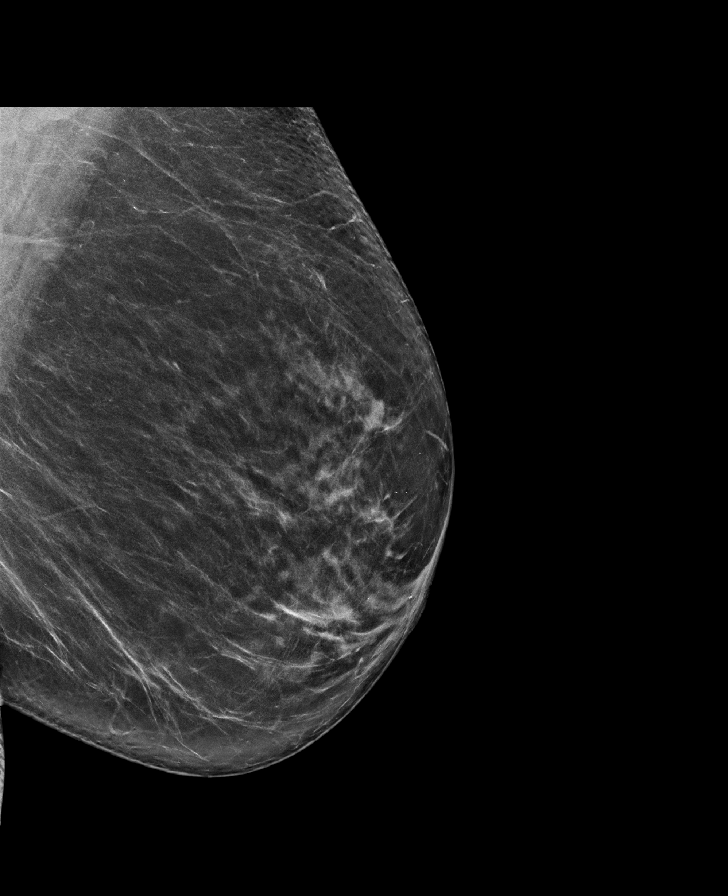

[R MLO synth-2D]
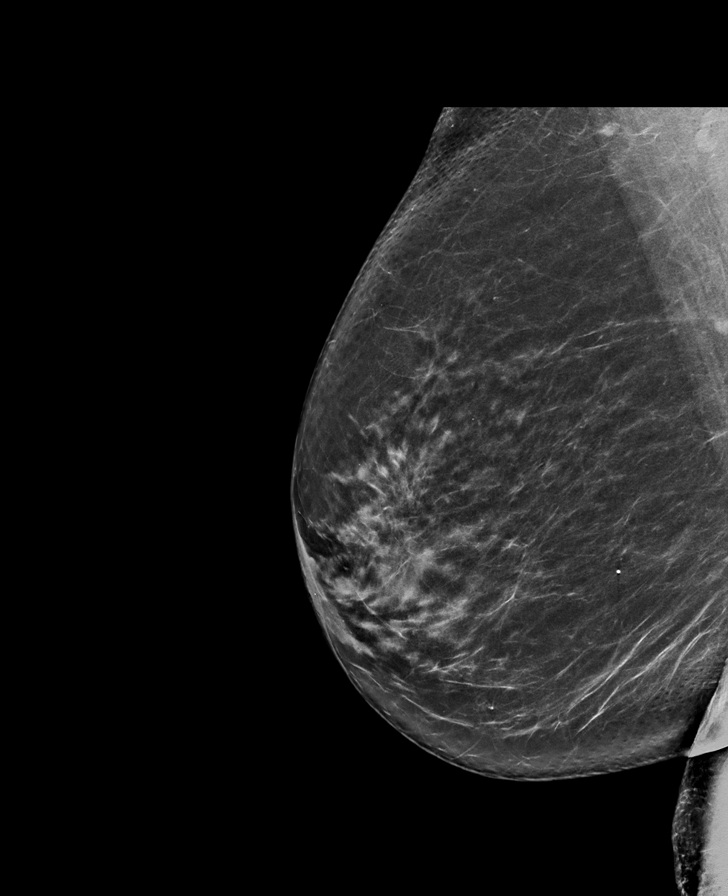

[L CC synth-2D]
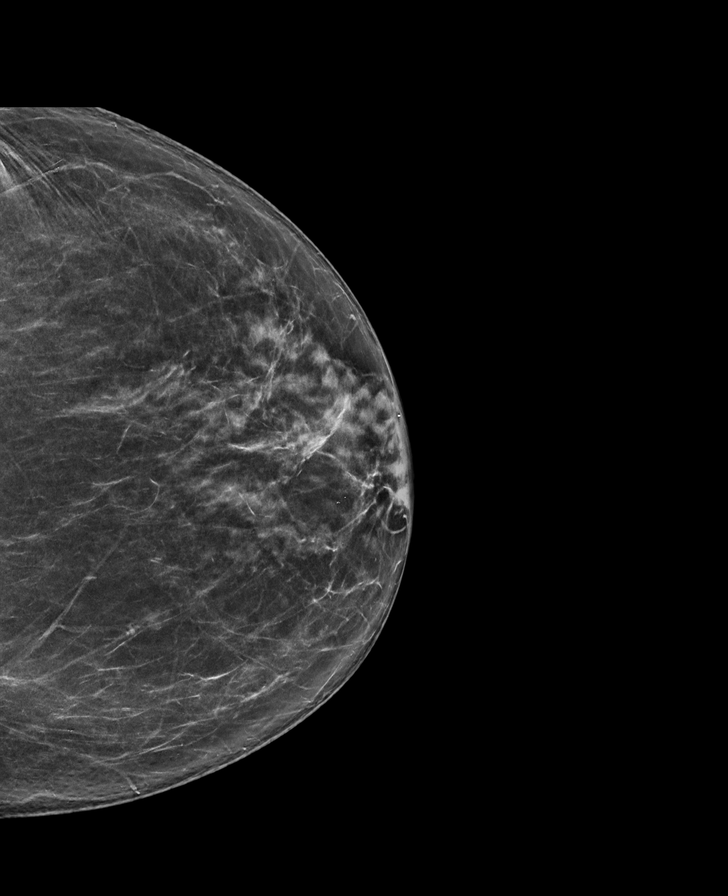

[R CC synth-2D]
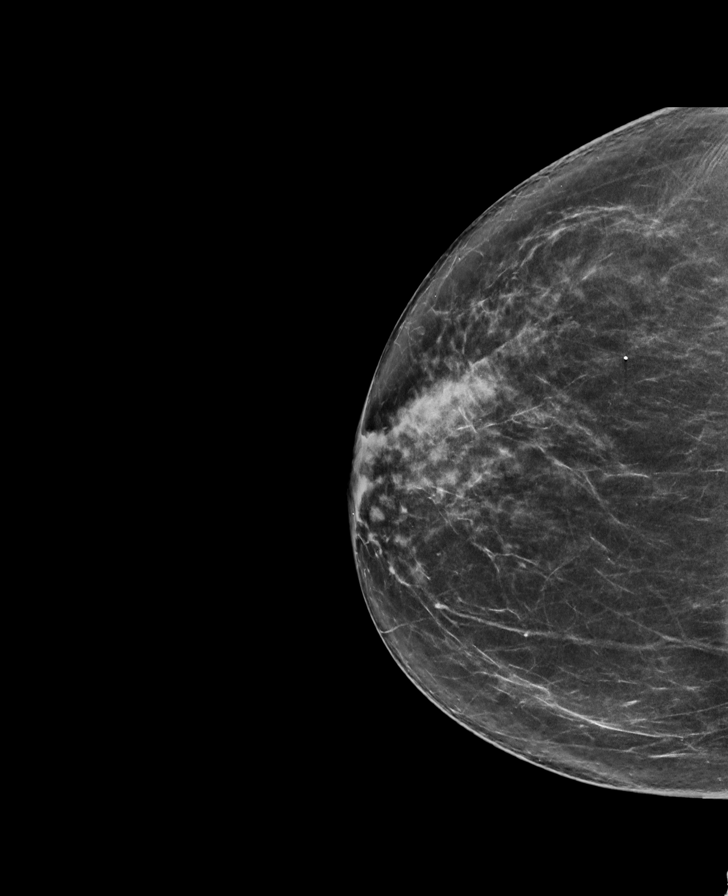

[R CC tomo · tomo slice 41/80.0]
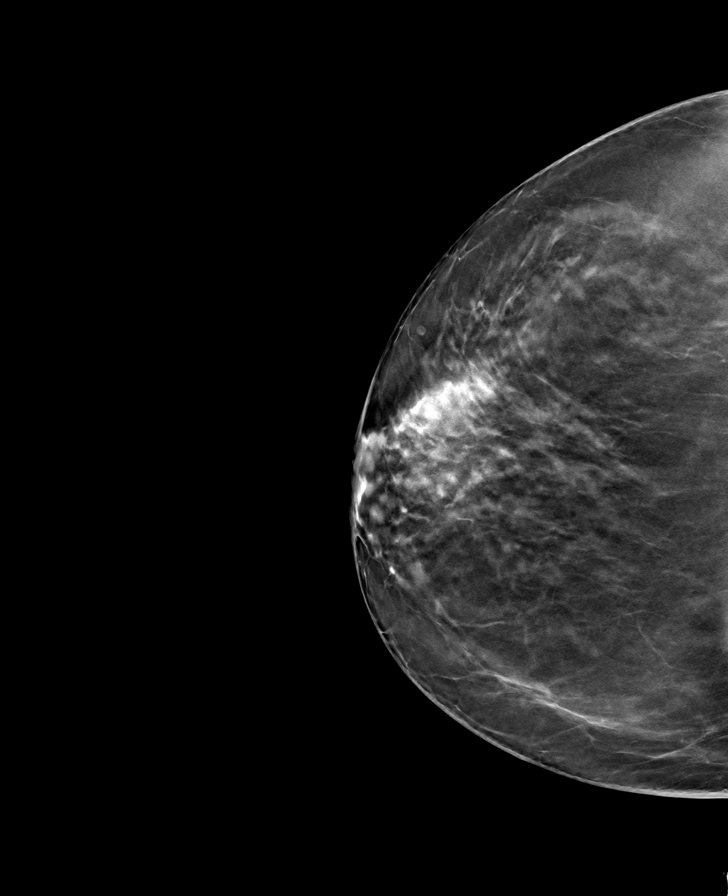

[L CC tomo · tomo slice 41/80.0]
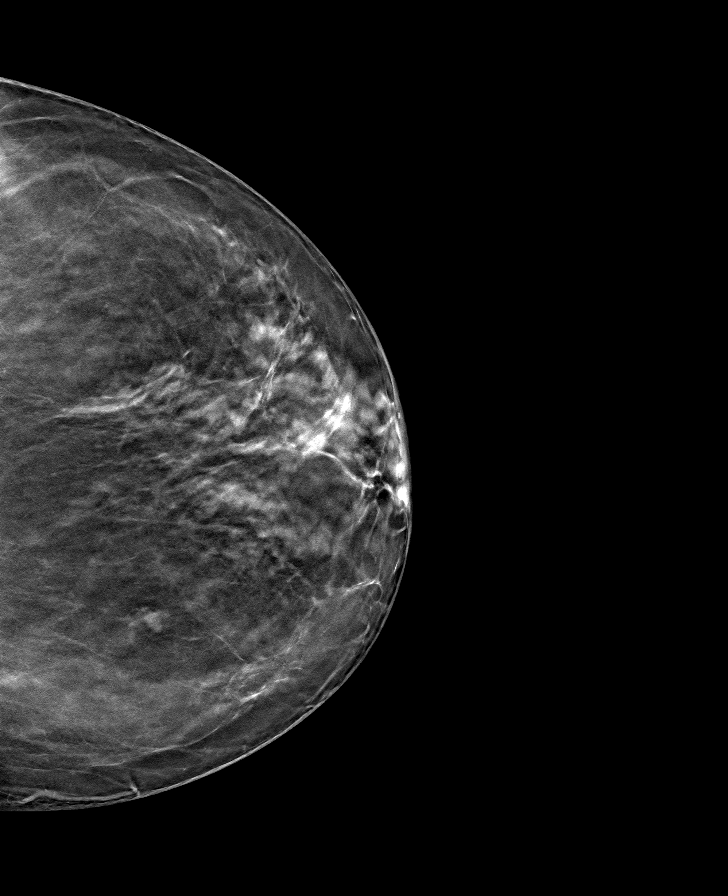

[L MLO tomo · tomo slice 45/88.0]
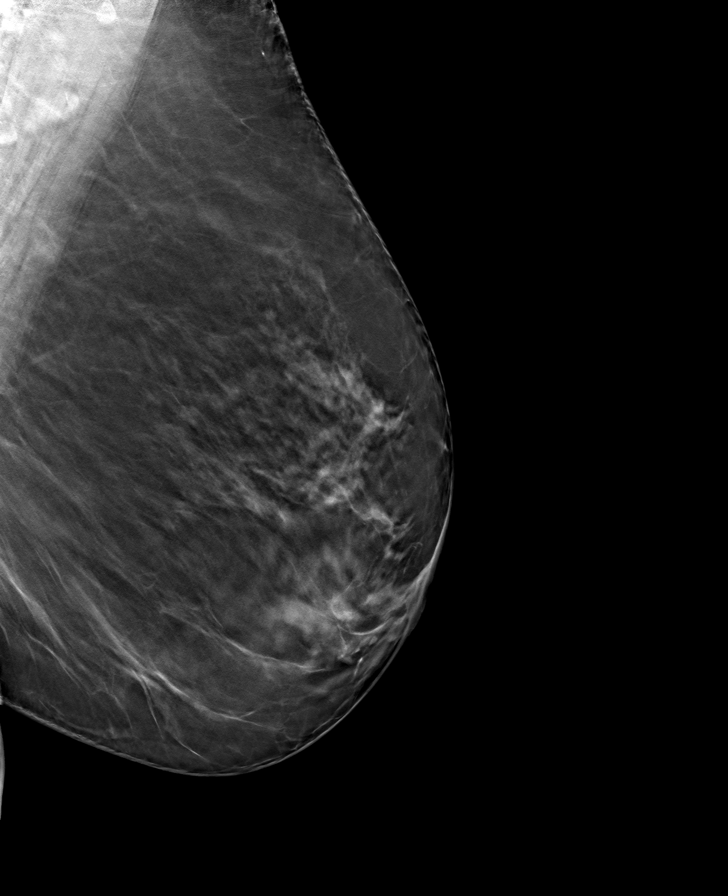

[R MLO tomo · tomo slice 41/82.0]
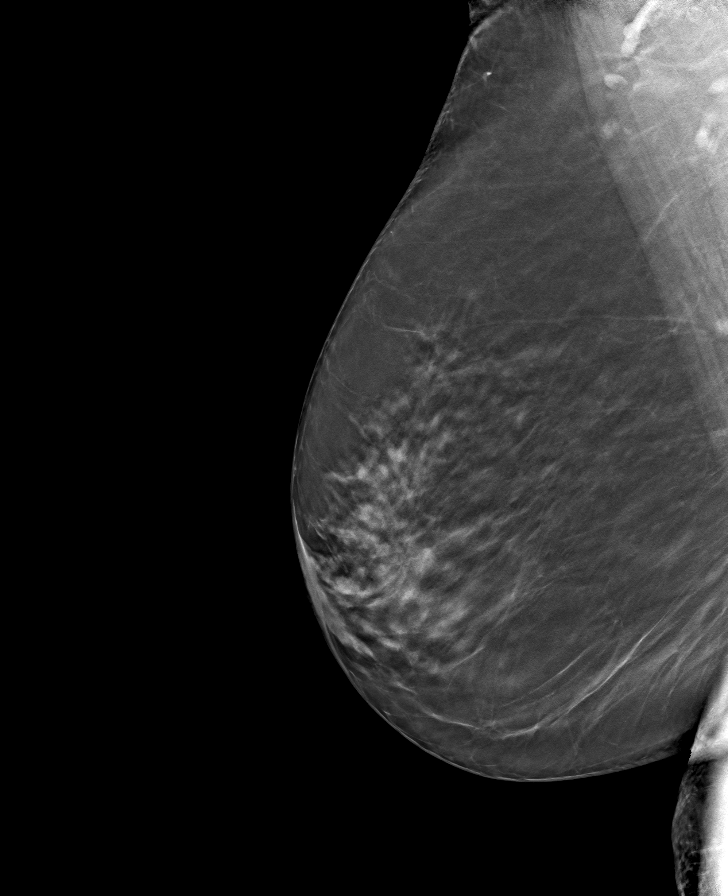

[8 of 24 positions shown; findings below may reference images not displayed]

ACR Breast Density Category c: The breast tissue is heterogeneously
dense, which may obscure small masses.
FINDINGS: There are no findings suspicious for malignancy. Images were
processed with CAD.
IMPRESSION: No mammographic evidence of malignancy. A result letter of this
screening mammogram will be mailed directly to the patient.

RECOMMENDATION:
Screening mammogram in one year. (Code:[5V])

BI-RADS CATEGORY  1: Negative.

## 2018-06-02 DIAGNOSIS — M8580 Other specified disorders of bone density and structure, unspecified site: Secondary | ICD-10-CM | POA: Diagnosis not present

## 2018-06-02 DIAGNOSIS — E039 Hypothyroidism, unspecified: Secondary | ICD-10-CM | POA: Diagnosis not present

## 2018-06-02 DIAGNOSIS — I10 Essential (primary) hypertension: Secondary | ICD-10-CM | POA: Diagnosis not present

## 2018-06-02 DIAGNOSIS — E7800 Pure hypercholesterolemia, unspecified: Secondary | ICD-10-CM | POA: Diagnosis not present

## 2018-09-22 DIAGNOSIS — H5201 Hypermetropia, right eye: Secondary | ICD-10-CM | POA: Diagnosis not present

## 2018-09-22 DIAGNOSIS — H25012 Cortical age-related cataract, left eye: Secondary | ICD-10-CM | POA: Diagnosis not present

## 2018-09-22 DIAGNOSIS — Z961 Presence of intraocular lens: Secondary | ICD-10-CM | POA: Diagnosis not present

## 2018-10-05 DIAGNOSIS — W19XXXA Unspecified fall, initial encounter: Secondary | ICD-10-CM | POA: Diagnosis not present

## 2018-10-05 DIAGNOSIS — Z87891 Personal history of nicotine dependence: Secondary | ICD-10-CM | POA: Diagnosis not present

## 2018-10-05 DIAGNOSIS — M25571 Pain in right ankle and joints of right foot: Secondary | ICD-10-CM | POA: Diagnosis not present

## 2018-10-05 DIAGNOSIS — S82851A Displaced trimalleolar fracture of right lower leg, initial encounter for closed fracture: Secondary | ICD-10-CM | POA: Diagnosis not present

## 2018-10-05 DIAGNOSIS — I10 Essential (primary) hypertension: Secondary | ICD-10-CM | POA: Diagnosis not present

## 2018-10-05 DIAGNOSIS — G8911 Acute pain due to trauma: Secondary | ICD-10-CM | POA: Diagnosis not present

## 2018-10-05 DIAGNOSIS — R52 Pain, unspecified: Secondary | ICD-10-CM | POA: Diagnosis not present

## 2018-10-08 ENCOUNTER — Other Ambulatory Visit: Payer: Self-pay

## 2018-10-08 ENCOUNTER — Other Ambulatory Visit (HOSPITAL_COMMUNITY): Payer: Self-pay | Admitting: Orthopedic Surgery

## 2018-10-08 ENCOUNTER — Other Ambulatory Visit (HOSPITAL_COMMUNITY)
Admission: RE | Admit: 2018-10-08 | Discharge: 2018-10-08 | Disposition: A | Discharge status: Home / Self Care | Payer: Medicare Other | Source: Ambulatory Visit | Attending: Orthopedic Surgery | Admitting: Orthopedic Surgery

## 2018-10-08 ENCOUNTER — Encounter (HOSPITAL_BASED_OUTPATIENT_CLINIC_OR_DEPARTMENT_OTHER): Payer: Self-pay | Admitting: *Deleted

## 2018-10-08 DIAGNOSIS — Z01812 Encounter for preprocedural laboratory examination: Secondary | ICD-10-CM | POA: Insufficient documentation

## 2018-10-08 DIAGNOSIS — Z1159 Encounter for screening for other viral diseases: Secondary | ICD-10-CM | POA: Insufficient documentation

## 2018-10-08 DIAGNOSIS — S82851A Displaced trimalleolar fracture of right lower leg, initial encounter for closed fracture: Secondary | ICD-10-CM | POA: Insufficient documentation

## 2018-10-08 LAB — SARS CORONAVIRUS 2 (TAT 6-24 HRS): SARS Coronavirus 2: NEGATIVE

## 2018-10-09 ENCOUNTER — Encounter (HOSPITAL_BASED_OUTPATIENT_CLINIC_OR_DEPARTMENT_OTHER): Payer: Self-pay | Admitting: Anesthesiology

## 2018-10-09 ENCOUNTER — Ambulatory Visit (HOSPITAL_BASED_OUTPATIENT_CLINIC_OR_DEPARTMENT_OTHER)
Admission: RE | Admit: 2018-10-09 | Discharge: 2018-10-09 | Disposition: A | Discharge status: Home / Self Care | Payer: Medicare Other | Attending: Orthopedic Surgery | Admitting: Orthopedic Surgery

## 2018-10-09 ENCOUNTER — Ambulatory Visit (HOSPITAL_BASED_OUTPATIENT_CLINIC_OR_DEPARTMENT_OTHER): Payer: Medicare Other | Admitting: Certified Registered"

## 2018-10-09 ENCOUNTER — Other Ambulatory Visit: Payer: Self-pay

## 2018-10-09 ENCOUNTER — Encounter (HOSPITAL_BASED_OUTPATIENT_CLINIC_OR_DEPARTMENT_OTHER)
Admission: RE | Disposition: A | Discharge status: Home / Self Care | Payer: Self-pay | Source: Home / Self Care | Attending: Orthopedic Surgery

## 2018-10-09 DIAGNOSIS — G8918 Other acute postprocedural pain: Secondary | ICD-10-CM | POA: Diagnosis not present

## 2018-10-09 DIAGNOSIS — Z79890 Hormone replacement therapy (postmenopausal): Secondary | ICD-10-CM | POA: Diagnosis not present

## 2018-10-09 DIAGNOSIS — Z79899 Other long term (current) drug therapy: Secondary | ICD-10-CM | POA: Insufficient documentation

## 2018-10-09 DIAGNOSIS — I129 Hypertensive chronic kidney disease with stage 1 through stage 4 chronic kidney disease, or unspecified chronic kidney disease: Secondary | ICD-10-CM | POA: Insufficient documentation

## 2018-10-09 DIAGNOSIS — E7800 Pure hypercholesterolemia, unspecified: Secondary | ICD-10-CM | POA: Diagnosis not present

## 2018-10-09 DIAGNOSIS — S82851A Displaced trimalleolar fracture of right lower leg, initial encounter for closed fracture: Secondary | ICD-10-CM | POA: Diagnosis not present

## 2018-10-09 DIAGNOSIS — N189 Chronic kidney disease, unspecified: Secondary | ICD-10-CM | POA: Insufficient documentation

## 2018-10-09 DIAGNOSIS — E039 Hypothyroidism, unspecified: Secondary | ICD-10-CM | POA: Insufficient documentation

## 2018-10-09 DIAGNOSIS — W19XXXA Unspecified fall, initial encounter: Secondary | ICD-10-CM | POA: Diagnosis not present

## 2018-10-09 HISTORY — DX: Displaced trimalleolar fracture of unspecified lower leg, initial encounter for closed fracture: S82.853A

## 2018-10-09 HISTORY — DX: Essential (primary) hypertension: I10

## 2018-10-09 HISTORY — DX: Pure hypercholesterolemia, unspecified: E78.00

## 2018-10-09 HISTORY — DX: Hypothyroidism, unspecified: E03.9

## 2018-10-09 HISTORY — PX: ORIF ANKLE FRACTURE: SHX5408

## 2018-10-09 HISTORY — DX: Chronic kidney disease, unspecified: N18.9

## 2018-10-09 SURGERY — OPEN REDUCTION INTERNAL FIXATION (ORIF) ANKLE FRACTURE
Anesthesia: General | Site: Ankle | Laterality: Right

## 2018-10-09 MED ORDER — SCOPOLAMINE 1 MG/3DAYS TD PT72: 1.0000 | MEDICATED_PATCH | Freq: Once | TRANSDERMAL | Status: DC

## 2018-10-09 MED ORDER — MEPERIDINE HCL 25 MG/ML IJ SOLN: 6.2500 mg | INTRAMUSCULAR | Status: DC | PRN

## 2018-10-09 MED ORDER — FENTANYL CITRATE (PF) 100 MCG/2ML IJ SOLN
50.0000 ug | INTRAMUSCULAR | Status: DC | PRN
  Administered 2018-10-09: 50 ug via INTRAVENOUS

## 2018-10-09 MED ORDER — LIDOCAINE 2% (20 MG/ML) 5 ML SYRINGE
INTRAMUSCULAR | Status: DC | PRN
  Administered 2018-10-09: 40 mg via INTRAVENOUS

## 2018-10-09 MED ORDER — FENTANYL CITRATE (PF) 100 MCG/2ML IJ SOLN
INTRAMUSCULAR | Status: AC
  Filled 2018-10-09: qty 2

## 2018-10-09 MED ORDER — LIDOCAINE 2% (20 MG/ML) 5 ML SYRINGE
INTRAMUSCULAR | Status: AC
  Filled 2018-10-09: qty 5

## 2018-10-09 MED ORDER — SUGAMMADEX SODIUM 200 MG/2ML IV SOLN
INTRAVENOUS | Status: DC | PRN
  Administered 2018-10-09: 150 mg via INTRAVENOUS

## 2018-10-09 MED ORDER — CHLORHEXIDINE GLUCONATE 4 % EX LIQD: 60.0000 mL | Freq: Once | CUTANEOUS | Status: DC

## 2018-10-09 MED ORDER — CEFAZOLIN SODIUM-DEXTROSE 2-4 GM/100ML-% IV SOLN
2.0000 g | INTRAVENOUS | Status: AC
  Administered 2018-10-09: 2 g via INTRAVENOUS
  Filled 2018-10-09: qty 100

## 2018-10-09 MED ORDER — ACETAMINOPHEN 10 MG/ML IV SOLN: 1000.0000 mg | Freq: Once | INTRAVENOUS | Status: DC | PRN

## 2018-10-09 MED ORDER — PROPOFOL 10 MG/ML IV BOLUS
INTRAVENOUS | Status: AC
  Filled 2018-10-09: qty 20

## 2018-10-09 MED ORDER — ONDANSETRON HCL 4 MG/2ML IJ SOLN
INTRAMUSCULAR | Status: DC | PRN
  Administered 2018-10-09: 4 mg via INTRAVENOUS

## 2018-10-09 MED ORDER — BUPIVACAINE-EPINEPHRINE (PF) 0.5% -1:200000 IJ SOLN
INTRAMUSCULAR | Status: DC | PRN
  Administered 2018-10-09: 10 mL
  Administered 2018-10-09: 30 mL via PERINEURAL

## 2018-10-09 MED ORDER — FENTANYL CITRATE (PF) 100 MCG/2ML IJ SOLN: 25.0000 ug | INTRAMUSCULAR | Status: DC | PRN

## 2018-10-09 MED ORDER — EPHEDRINE SULFATE-NACL 50-0.9 MG/10ML-% IV SOSY
PREFILLED_SYRINGE | INTRAVENOUS | Status: DC | PRN
  Administered 2018-10-09: 5 mg via INTRAVENOUS
  Administered 2018-10-09 (×2): 10 mg via INTRAVENOUS

## 2018-10-09 MED ORDER — OXYCODONE HCL 5 MG/5ML PO SOLN: 5.0000 mg | Freq: Once | ORAL | Status: DC | PRN

## 2018-10-09 MED ORDER — ROCURONIUM BROMIDE 100 MG/10ML IV SOLN
INTRAVENOUS | Status: DC | PRN
  Administered 2018-10-09: 40 mg via INTRAVENOUS
  Administered 2018-10-09: 10 mg via INTRAVENOUS

## 2018-10-09 MED ORDER — PROPOFOL 500 MG/50ML IV EMUL
INTRAVENOUS | Status: DC | PRN
  Administered 2018-10-09: 75 ug/kg/min via INTRAVENOUS

## 2018-10-09 MED ORDER — SUGAMMADEX SODIUM 500 MG/5ML IV SOLN
INTRAVENOUS | Status: AC
  Filled 2018-10-09: qty 5

## 2018-10-09 MED ORDER — MIDAZOLAM HCL 2 MG/2ML IJ SOLN
INTRAMUSCULAR | Status: AC
  Filled 2018-10-09: qty 2

## 2018-10-09 MED ORDER — LACTATED RINGERS IV SOLN
INTRAVENOUS | Status: DC
  Administered 2018-10-09 (×2): via INTRAVENOUS

## 2018-10-09 MED ORDER — OXYCODONE HCL 5 MG PO TABS: 5.0000 mg | ORAL_TABLET | Freq: Once | ORAL | Status: DC | PRN

## 2018-10-09 MED ORDER — ACETAMINOPHEN 160 MG/5ML PO SOLN: 325.0000 mg | Freq: Once | ORAL | Status: DC | PRN

## 2018-10-09 MED ORDER — PROPOFOL 10 MG/ML IV BOLUS
INTRAVENOUS | Status: DC | PRN
  Administered 2018-10-09: 110 mg via INTRAVENOUS

## 2018-10-09 MED ORDER — SODIUM CHLORIDE 0.9 % IV SOLN: INTRAVENOUS | Status: DC

## 2018-10-09 MED ORDER — EPHEDRINE 5 MG/ML INJ
INTRAVENOUS | Status: AC
  Filled 2018-10-09: qty 10

## 2018-10-09 MED ORDER — OXYCODONE HCL 5 MG PO TABS: 5.0000 mg | ORAL_TABLET | Freq: Four times a day (QID) | ORAL | 0 refills | Status: DC | PRN

## 2018-10-09 MED ORDER — FENTANYL CITRATE (PF) 100 MCG/2ML IJ SOLN
INTRAMUSCULAR | Status: DC | PRN
  Administered 2018-10-09: 50 ug via INTRAVENOUS

## 2018-10-09 MED ORDER — DEXAMETHASONE SODIUM PHOSPHATE 10 MG/ML IJ SOLN
INTRAMUSCULAR | Status: DC | PRN
  Administered 2018-10-09: 5 mg via INTRAVENOUS

## 2018-10-09 MED ORDER — ACETAMINOPHEN 325 MG PO TABS: 325.0000 mg | ORAL_TABLET | Freq: Once | ORAL | Status: DC | PRN

## 2018-10-09 MED ORDER — 0.9 % SODIUM CHLORIDE (POUR BTL) OPTIME
TOPICAL | Status: DC | PRN
  Administered 2018-10-09: 400 mL

## 2018-10-09 MED ORDER — MIDAZOLAM HCL 2 MG/2ML IJ SOLN: 1.0000 mg | INTRAMUSCULAR | Status: DC | PRN

## 2018-10-09 MED ORDER — LACTATED RINGERS IV SOLN: INTRAVENOUS | Status: DC

## 2018-10-09 MED ORDER — PROMETHAZINE HCL 25 MG/ML IJ SOLN: 6.2500 mg | INTRAMUSCULAR | Status: DC | PRN

## 2018-10-09 MED ORDER — CEFAZOLIN SODIUM-DEXTROSE 2-4 GM/100ML-% IV SOLN
INTRAVENOUS | Status: AC
  Filled 2018-10-09: qty 100

## 2018-10-09 SURGICAL SUPPLY — 78 items
BANDAGE ESMARK 6X9 LF (GAUZE/BANDAGES/DRESSINGS) ×1 IMPLANT
BIT DRILL 2.5X2.75 QC CALB (BIT) ×1 IMPLANT
BIT DRILL 2.9 CANN QC NONSTRL (BIT) ×1 IMPLANT
BLADE SURG 15 STRL LF DISP TIS (BLADE) ×2 IMPLANT
BLADE SURG 15 STRL SS (BLADE) ×2
BNDG COHESIVE 4X5 TAN STRL (GAUZE/BANDAGES/DRESSINGS) ×2 IMPLANT
BNDG COHESIVE 6X5 TAN STRL LF (GAUZE/BANDAGES/DRESSINGS) ×2 IMPLANT
BNDG ESMARK 4X9 LF (GAUZE/BANDAGES/DRESSINGS) IMPLANT
BNDG ESMARK 6X9 LF (GAUZE/BANDAGES/DRESSINGS) ×2
CANISTER SUCT 1200ML W/VALVE (MISCELLANEOUS) ×2 IMPLANT
CHLORAPREP W/TINT 26 (MISCELLANEOUS) ×2 IMPLANT
COVER BACK TABLE REUSABLE LG (DRAPES) ×2 IMPLANT
COVER WAND RF STERILE (DRAPES) IMPLANT
CUFF TOURN SGL QUICK 34 (TOURNIQUET CUFF)
CUFF TRNQT CYL 34X4.125X (TOURNIQUET CUFF) IMPLANT
DECANTER SPIKE VIAL GLASS SM (MISCELLANEOUS) IMPLANT
DRAPE EXTREMITY T 121X128X90 (DISPOSABLE) ×2 IMPLANT
DRAPE OEC MINIVIEW 54X84 (DRAPES) ×2 IMPLANT
DRAPE U-SHAPE 47X51 STRL (DRAPES) ×2 IMPLANT
DRSG MEPITEL 4X7.2 (GAUZE/BANDAGES/DRESSINGS) ×2 IMPLANT
DRSG PAD ABDOMINAL 8X10 ST (GAUZE/BANDAGES/DRESSINGS) ×4 IMPLANT
ELECT REM PT RETURN 9FT ADLT (ELECTROSURGICAL) ×2
ELECTRODE REM PT RTRN 9FT ADLT (ELECTROSURGICAL) ×1 IMPLANT
GAUZE SPONGE 4X4 12PLY STRL (GAUZE/BANDAGES/DRESSINGS) ×2 IMPLANT
GLOVE BIO SURGEON STRL SZ8 (GLOVE) ×2 IMPLANT
GLOVE BIOGEL PI IND STRL 8 (GLOVE) ×2 IMPLANT
GLOVE BIOGEL PI INDICATOR 8 (GLOVE) ×2
GLOVE ECLIPSE 8.0 STRL XLNG CF (GLOVE) ×2 IMPLANT
GOWN STRL REUS W/ TWL LRG LVL3 (GOWN DISPOSABLE) ×1 IMPLANT
GOWN STRL REUS W/ TWL XL LVL3 (GOWN DISPOSABLE) ×2 IMPLANT
GOWN STRL REUS W/TWL LRG LVL3 (GOWN DISPOSABLE) ×1
GOWN STRL REUS W/TWL XL LVL3 (GOWN DISPOSABLE) ×2
K-WIRE ACE 1.6X6 (WIRE) ×4
KWIRE ACE 1.6X6 (WIRE) IMPLANT
NEEDLE HYPO 22GX1.5 SAFETY (NEEDLE) IMPLANT
NS IRRIG 1000ML POUR BTL (IV SOLUTION) ×2 IMPLANT
PACK BASIN DAY SURGERY FS (CUSTOM PROCEDURE TRAY) ×2 IMPLANT
PAD CAST 4YDX4 CTTN HI CHSV (CAST SUPPLIES) ×1 IMPLANT
PADDING CAST ABS 4INX4YD NS (CAST SUPPLIES)
PADDING CAST ABS COTTON 4X4 ST (CAST SUPPLIES) IMPLANT
PADDING CAST COTTON 4X4 STRL (CAST SUPPLIES) ×1
PADDING CAST COTTON 6X4 STRL (CAST SUPPLIES) ×2 IMPLANT
PENCIL BUTTON HOLSTER BLD 10FT (ELECTRODE) ×2 IMPLANT
PLATE LOCK 8H 103 BILAT FIB (Plate) ×1 IMPLANT
PLATE SPIDER 16 (Washer) ×1 IMPLANT
SANITIZER HAND PURELL 535ML FO (MISCELLANEOUS) ×2 IMPLANT
SCREW ACE CAN 4.0 40M (Screw) ×1 IMPLANT
SCREW ACE CAN 4.0 42M (Screw) ×1 IMPLANT
SCREW CORTICAL 3.5MM  34MM (Screw) ×1 IMPLANT
SCREW CORTICAL 3.5MM 34MM (Screw) IMPLANT
SCREW LOCK CORT STAR 3.5X10 (Screw) ×1 IMPLANT
SCREW LOCK CORT STAR 3.5X12 (Screw) ×1 IMPLANT
SCREW LOCK CORT STAR 3.5X18 (Screw) ×1 IMPLANT
SCREW LOW PROFILE 18MMX3.5MM (Screw) ×2 IMPLANT
SCREW NON LOCKING LP 3.5 16MM (Screw) ×1 IMPLANT
SHEET MEDIUM DRAPE 40X70 STRL (DRAPES) ×2 IMPLANT
SLEEVE SCD COMPRESS KNEE MED (MISCELLANEOUS) ×2 IMPLANT
SPLINT FAST PLASTER 5X30 (CAST SUPPLIES) ×20
SPLINT PLASTER CAST FAST 5X30 (CAST SUPPLIES) ×20 IMPLANT
SPONGE LAP 18X18 RF (DISPOSABLE) ×2 IMPLANT
STOCKINETTE 6  STRL (DRAPES) ×1
STOCKINETTE 6 STRL (DRAPES) ×1 IMPLANT
SUCTION FRAZIER HANDLE 10FR (MISCELLANEOUS) ×1
SUCTION TUBE FRAZIER 10FR DISP (MISCELLANEOUS) ×1 IMPLANT
SUT ETHILON 3 0 PS 1 (SUTURE) ×3 IMPLANT
SUT FIBERWIRE #2 38 T-5 BLUE (SUTURE)
SUT MNCRL AB 3-0 PS2 18 (SUTURE) ×1 IMPLANT
SUT PDS AB 0 CT 36 (SUTURE) ×1 IMPLANT
SUT VIC AB 0 SH 27 (SUTURE) IMPLANT
SUT VIC AB 2-0 SH 27 (SUTURE) ×2
SUT VIC AB 2-0 SH 27XBRD (SUTURE) ×1 IMPLANT
SUTURE FIBERWR #2 38 T-5 BLUE (SUTURE) IMPLANT
SYR BULB 3OZ (MISCELLANEOUS) ×2 IMPLANT
SYR CONTROL 10ML LL (SYRINGE) IMPLANT
TOWEL GREEN STERILE FF (TOWEL DISPOSABLE) ×4 IMPLANT
TUBE CONNECTING 20X1/4 (TUBING) ×2 IMPLANT
UNDERPAD 30X30 (UNDERPADS AND DIAPERS) ×2 IMPLANT
WASHER FLAT 4.0 (Washer) ×1 IMPLANT

## 2018-10-09 NOTE — Anesthesia Procedure Notes (Signed)
Anesthesia Regional Block: Popliteal block   Pre-Anesthetic Checklist: ,, timeout performed, Correct Patient, Correct Site, Correct Laterality, Correct Procedure, Correct Position, site marked, Risks and benefits discussed,  Surgical consent,  Pre-op evaluation,  At surgeon's request and post-op pain management  Laterality: Right  Prep: chloraprep       Needles:  Injection technique: Single-shot  Needle Type: Echogenic Stimulator Needle     Needle Length: 9cm  Needle Gauge: 21     Additional Needles:   Procedures:,,,, ultrasound used (permanent image in chart),,,,  Narrative:  Start time: 10/09/2018 12:00 PM End time: 10/09/2018 12:05 PM Injection made incrementally with aspirations every 5 mL.  Performed by: Personally  Anesthesiologist: Effie Berkshire, MD  Additional Notes: Patient tolerated the procedure well. Local anesthetic introduced in an incremental fashion under minimal resistance after negative aspirations. No paresthesias were elicited. After completion of the procedure, no acute issues were identified and patient continued to be monitored by RN.

## 2018-10-09 NOTE — H&P (Signed)
Audrey Cowan is an 78 y.o. female.   Chief Complaint: Right ankle pain HPI: The patient is a 78 year old female [place].  She has a displaced trimalleolar ankle fracture dislocation.  She presents now for operative treatment of this displaced and unstable right ankle injury.  Past Medical History:  Diagnosis Date  . Chronic kidney disease    CKD  . Hypercholesteremia   . Hypertension   . Hypothyroidism   . Trimalleolar fracture    RIGHT ANKLE    History reviewed. No pertinent surgical history.  History reviewed. No pertinent family history. Social History:  reports that she has never smoked. She has never used smokeless tobacco. She reports previous alcohol use. She reports that she does not use drugs.  Allergies: No Known Allergies  Medications Prior to Admission  Medication Sig Dispense Refill  . amLODipine (NORVASC) 2.5 MG tablet Take 2.5 mg by mouth daily.    Marland Kitchen aspirin EC 81 MG tablet Take 81 mg by mouth every other day.    Marland Kitchen atorvastatin (LIPITOR) 20 MG tablet Take 20 mg by mouth daily.    . benazepril (LOTENSIN) 40 MG tablet Take 40 mg by mouth daily.    . Calcium Carbonate-Vitamin D (CALTRATE 600+D PO) Take by mouth.    . cholecalciferol (VITAMIN D3) 25 MCG (1000 UT) tablet Take 1,000 Units by mouth daily.    . furosemide (LASIX) 20 MG tablet Take 20 mg by mouth. PT TAKES 1/2 TAB EVERY OTHER DAY    . Grape Seed 100 MG CAPS Take by mouth. MUSCADINE SEED FOR PSORIASIS    . HYDROcodone-acetaminophen (NORCO/VICODIN) 5-325 MG tablet Take 1-2 tablets by mouth every 6 (six) hours as needed for moderate pain.    Marland Kitchen levothyroxine (SYNTHROID) 88 MCG tablet Take 88 mcg by mouth daily before breakfast.    . ondansetron (ZOFRAN) 4 MG tablet Take 4 mg by mouth every 8 (eight) hours as needed for nausea or vomiting.      Results for orders placed or performed during the hospital encounter of 10/08/18 (from the past 48 hour(s))  SARS  Coronavirus 2 (Performed in [place] [facility] hospital lab)     Status: None   Collection Time: 10/08/18 11:23 AM   Specimen: Nasal Swab  Result Value Ref Range   SARS Coronavirus 2 NEGATIVE NEGATIVE    Comment: (NOTE) SARS-CoV-2 target nucleic acids are NOT DETECTED. The SARS-CoV-2 RNA is generally detectable in upper and lower respiratory specimens during the acute phase of infection. Negative results do not preclude SARS-CoV-2 infection, do not rule out co-infections with other pathogens, and should not be used as the sole basis for treatment or other patient management decisions. Negative results must be combined with clinical observations, patient history, and epidemiological information. The expected result is Negative. Fact Sheet for Patients: SugarRoll.be Fact Sheet for Healthcare Providers: https://www.woods-mathews.com/ This test is not yet approved or cleared by the [place] FDA and  has been authorized for detection and/or diagnosis of SARS-CoV-2 by FDA under an Emergency Use Authorization (EUA). This EUA will remain  in effect (meaning this test can be used) for the duration of the COVID-19 declaration under Section 56 4(b)(1) of the Act, 21 U.S.C. section 360bbb-3(b)(1), unless the authorization is terminated or revoked sooner. Performed at [facility], [place] [address] [place], [place] [address]    No results found.  ROS no recent fever, chills, nausea, vomiting or changes in  her appetite  Blood pressure (!) 156/95, pulse 82, resp. rate 13, height 5\' 5"  (1.651 m), weight 72.6 kg, SpO2 100 %. Physical Exam  Well-nourished well-developed woman in no apparent distress.  Alert and oriented x4.  Mood and affect are normal.  Extraocular motions are intact.  Respirations are unlabored.  Gait is nonweightbearing on the right.  The right lower extremity is immobilized in a splint.  Skin around the forefoot is healthy.  Brisk  capillary refill at the toes.  5 out of 5 strength in plantarflexion and dorsiflexion of the toes.  Assessment/Plan Right ankle trimalleolar fracture dislocation-closed and displaced- to the operating room today for open treatment with internal fixation.  The risks and benefits of the alternative treatment options have been discussed in detail.  The patient wishes to proceed with surgery and specifically understands risks of bleeding, infection, nerve damage, blood clots, need for additional surgery, amputation and death.   Wylene Simmer, MD 2018-10-13, 12:57 PM

## 2018-10-09 NOTE — Discharge Instructions (Addendum)
Audrey Hewitt, MD [facility]  Please read the following information regarding your care after surgery.  Medications  You only need a prescription for the narcotic pain medicine (ex. oxycodone, Percocet, Norco).  All of the other medicines listed below are available over the counter. X Aleve 2 pills twice a day for the first 3 days after surgery. X acetominophen (Tylenol) 650 mg every 4-6 hours as you need for minor to moderate pain X oxycodone as prescribed for severe pain  Narcotic pain medicine (ex. oxycodone, Percocet, Vicodin) will cause constipation.  To prevent this problem, take the following medicines while you are taking any pain medicine. X docusate sodium (Colace) 100 mg twice a day X senna (Senokot) 2 tablets twice a day  X To help prevent blood clots, take a baby aspirin (81 mg) twice a day for two weeks after surgery.  You should also get up every hour while you are awake to move around.    Weight Bearing X Do not bear any weight on the operated leg or foot.  Cast / Splint / Dressing X Keep your splint, cast or dressing clean and dry.  Don't put anything (coat hanger, pencil, etc) down inside of it.  If it gets damp, use a hair dryer on the cool setting to dry it.  If it gets soaked, call the office to schedule an appointment for a cast change.   After your dressing, cast or splint is removed; you may shower, but do not soak or scrub the wound.  Allow the water to run over it, and then gently pat it dry.  Swelling It is normal for you to have swelling where you had surgery.  To reduce swelling and pain, keep your toes above your nose for at least 3 days after surgery.  It may be necessary to keep your foot or leg elevated for several weeks.  If it hurts, it should be elevated.  Follow Up Call my office at [phone] when you are discharged from the hospital or surgery center to schedule an appointment to be seen two weeks after surgery.  Call my office at [phone] if  you develop a fever >101.5 F, nausea, vomiting, bleeding from the surgical site or severe pain.     Post Anesthesia Home Care Instructions  Activity: Get plenty of rest for the remainder of the day. A responsible individual must stay with you for 24 hours following the procedure.  For the next 24 hours, DO NOT: -Drive a car -Operate machinery -Drink alcoholic beverages -Take any medication unless instructed by your physician -Make any legal decisions or sign important papers.  Meals: Start with liquid foods such as gelatin or soup. Progress to regular foods as tolerated. Avoid greasy, spicy, heavy foods. If nausea and/or vomiting occur, drink only clear liquids until the nausea and/or vomiting subsides. Call your physician if vomiting continues.  Special Instructions/Symptoms: Your throat may feel dry or sore from the anesthesia or the breathing tube placed in your throat during surgery. If this causes discomfort, gargle with warm salt water. The discomfort should disappear within 24 hours.  If you had a scopolamine patch placed behind your ear for the management of post- operative nausea and/or vomiting:  1. The medication in the patch is effective for 72 hours, after which it should be removed.  Wrap patch in a tissue and discard in the trash. Wash hands thoroughly with soap and water. 2. You may remove the patch earlier than 72 hours if you experience   unpleasant side effects which may include dry mouth, dizziness or visual disturbances. 3. Avoid touching the patch. Wash your hands with soap and water after contact with the patch.    Regional Anesthesia Blocks  1. Numbness or the inability to move the "blocked" extremity may last from 3-48 hours after placement. The length of time depends on the medication injected and your individual response to the medication. If the numbness is not going away after 48 hours, call your surgeon.  2. The extremity that is blocked will need to be  protected until the numbness is gone and the  Strength has returned. Because you cannot feel it, you will need to take extra care to avoid injury. Because it may be weak, you may have difficulty moving it or using it. You may not know what position it is in without looking at it while the block is in effect.  3. For blocks in the legs and feet, returning to weight bearing and walking needs to be done carefully. You will need to wait until the numbness is entirely gone and the strength has returned. You should be able to move your leg and foot normally before you try and bear weight or walk. You will need someone to be with you when you first try to ensure you do not fall and possibly risk injury.  4. Bruising and tenderness at the needle site are common side effects and will resolve in a few days.  5. Persistent numbness or new problems with movement should be communicated to the surgeon or the [facility] ([phone])/ [facility] ([phone]). 

## 2018-10-09 NOTE — Anesthesia Procedure Notes (Signed)
Anesthesia Regional Block: Adductor canal block   Pre-Anesthetic Checklist: ,, timeout performed, Correct Patient, Correct Site, Correct Laterality, Correct Procedure, Correct Position, site marked, Risks and benefits discussed,  Surgical consent,  Pre-op evaluation,  At surgeon's request and post-op pain management  Laterality: Right  Prep: chloraprep       Needles:  Injection technique: Single-shot  Needle Type: Echogenic Stimulator Needle     Needle Length: 9cm  Needle Gauge: 21     Additional Needles:   Procedures:,,,, ultrasound used (permanent image in chart),,,,  Narrative:  Start time: 10/09/2018 12:05 PM End time: 10/09/2018 12:10 PM Injection made incrementally with aspirations every 5 mL.  Performed by: Personally  Anesthesiologist: Effie Berkshire, MD  Additional Notes: Patient tolerated the procedure well. Local anesthetic introduced in an incremental fashion under minimal resistance after negative aspirations. No paresthesias were elicited. After completion of the procedure, no acute issues were identified and patient continued to be monitored by RN.

## 2018-10-09 NOTE — Anesthesia Postprocedure Evaluation (Signed)
Anesthesia Post Note  Patient: Audrey Cowan  Procedure(s) Performed: OPEN REDUCTION INTERNAL FIXATION (ORIF) Right ankle trimalleolar fracture (Right Ankle)     Patient location during evaluation: PACU Anesthesia Type: General Level of consciousness: awake and alert Pain management: pain level controlled Vital Signs Assessment: post-procedure vital signs reviewed and stable Respiratory status: spontaneous breathing, nonlabored ventilation, respiratory function stable and patient connected to nasal cannula oxygen Cardiovascular status: blood pressure returned to baseline and stable Postop Assessment: no apparent nausea or vomiting Anesthetic complications: no    Last Vitals:  Vitals:   10/09/18 1504 10/09/18 1515  BP:  (!) 124/91  Pulse: 91 91  Resp: 20 15  Temp: 36.6 C   SpO2: 98% 97%    Last Pain:  Vitals:   10/09/18 1504  PainSc: Asleep                 Montez Hageman

## 2018-10-09 NOTE — Anesthesia Preprocedure Evaluation (Addendum)
Anesthesia Evaluation  Patient identified by MRN, date of birth, ID band Patient awake    Reviewed: Allergy & Precautions, NPO status , Patient's Chart, lab work & pertinent test results  History of Anesthesia Complications (+) PONV  Airway Mallampati: II  TM Distance: >3 FB Neck ROM: Full    Dental  (+) Edentulous Upper, Edentulous Lower   Pulmonary neg pulmonary ROS,    breath sounds clear to auscultation       Cardiovascular hypertension, Pt. on medications  Rhythm:Regular Rate:Normal     Neuro/Psych negative neurological ROS  negative psych ROS   GI/Hepatic negative GI ROS, Neg liver ROS,   Endo/Other  Hypothyroidism   Renal/GU CRFRenal disease     Musculoskeletal negative musculoskeletal ROS (+)   Abdominal Normal abdominal exam  (+)   Peds  Hematology negative hematology ROS (+)   Anesthesia Other Findings - HLD  Reproductive/Obstetrics                           Anesthesia Physical Anesthesia Plan  ASA: II  Anesthesia Plan: General   Post-op Pain Management: GA combined w/ Regional for post-op pain   Induction: Intravenous  PONV Risk Score and Plan: 4 or greater and Ondansetron, Treatment may vary due to age or medical condition and Propofol infusion  Airway Management Planned: LMA  Additional Equipment: None  Intra-op Plan:   Post-operative Plan: Extubation in OR  Informed Consent: I have reviewed the patients History and Physical, chart, labs and discussed the procedure including the risks, benefits and alternatives for the proposed anesthesia with the patient or authorized representative who has indicated his/her understanding and acceptance.     Dental advisory given  Plan Discussed with: CRNA  Anesthesia Plan Comments:       Anesthesia Quick Evaluation

## 2018-10-09 NOTE — Op Note (Signed)
10/09/2018  3:00 PM  PATIENT:  Audrey Cowan  78 y.o. female  PRE-OPERATIVE DIAGNOSIS: Closed, displaced right ankle trimalleolar fracture dislocation  POST-OPERATIVE DIAGNOSIS: Same  Procedure(s): 1.  Open treatment of right ankle dislocation 2.  Open treatment of right trimalleolar ankle fracture with internal fixation with fixation of the posterior lip 3.  Stress examination of the right ankle under fluoroscopy 4.  AP, lateral and mortise radiographs of the right ankle  SURGEON:  Wylene Simmer, MD  ASSISTANT: None  ANESTHESIA:   General, regional  EBL:  minimal   TOURNIQUET:   Total Tourniquet Time Documented: Thigh (Right) - 68 minutes Total: Thigh (Right) - 68 minutes  COMPLICATIONS:  None apparent  DISPOSITION:  Extubated, awake and stable to recovery.  INDICATION FOR PROCEDURE: The patient is a 78 year old female [place]uring her right ankle.  She sustained a trimalleolar fracture dislocation.  She underwent closed reduction in the emergency room and was splinted.  She has lost reduction and has a subluxated ankle joint and a displaced trimalleolar fracture.  She presents now for operative treatment of this displaced and unstable right ankle injury.  The risks and benefits of the alternative treatment options have been discussed in detail.  The patient wishes to proceed with surgery and specifically understands risks of bleeding, infection, nerve damage, blood clots, need for additional surgery, amputation and death.  PROCEDURE IN DETAIL: After preoperative consent was obtained the correct operative site was identified' the patient was brought the operating room and placed supine on the operating table.  A surgical timeout was taken.  General anesthesia was administered.  Preop antibiotics were administered.  The patient was then turned into the lateral decubitus position on a beanbag with the right side up.  The right lower extremity was  then prepped and draped in standard sterile fashion with a tourniquet around the thigh.  The extremity was elevated and the tourniquet was inflated to 250 mmHg.  A posterior lateral incision was made at the level of the ankle.  Dissection was carried down through the subcutaneous tissues.  Care was taken to protect branches of the sural nerve.  The interval between the peroneals and the flexor houses longus was developed.  The ankle was reduced.  The fracture site was identified.  The periosteum was elevated, and the fracture was reduced.  It was provisionally pinned.  Radiographs confirmed appropriate reduction of the posterior malleolus fracture.  The K wire was overdrilled and a 4 mm partially-threaded cannulated screw was inserted.  This was noted to compress the fracture site appropriately.  A hole was then drilled at the apex of the fracture site.  A fully threaded 3.5 mm cortical screw was inserted with a spider washer to act as a buttress.  Dissection was then carried anteriorly to the lateral aspect of the fibula.  The periosteum was incised and elevated at the level of the fracture.  There was significant comminution noted.  A [employer] composite fibular plate was selected 8 holes in length.  The distal portion of the plate was contoured to fit the lateral malleolus.  The plate was secured to the lateral malleolus with locking and nonlocking screws.  The plate was then used as a reduction tool and secured to the fibula with a bicortical screw through the oblong hole.  The fracture was then reduced with a tenaculum.  The remaining proximal screws were drilled and filled with bicortical nonlocking screws.  0  PDS cerclage sutures were then tied around the area of comminution.  Attention was turned to the medial aspect of the ankle.  An incision was made over the medial malleolus.  Dissection was carried down through the subcutaneous tissues.  The fracture site was identified.  It was cleaned of all  hematoma and periosteum.  The fracture was reduced and provisionally pinned.  Radiographs confirmed appropriate reduction of the medial malleolus fracture.  The guidepin was overdrilled and a partially-threaded 4 mm cannulated screw was inserted.  This was tightened compressing the fracture site appropriately.  AP, mortise and lateral radiographs confirmed appropriate position and length of all hardware and appropriate reduction of all 3 fracture sites and the ankle joint.  The wounds were irrigated copiously.  The subcutaneous tissues and fascia were approximated with 2-0 Vicryl.  The subcutaneous tissues were approximated with 3-0 Monocryl.  The skin incision was closed with running 3-0 nylon sutures.  Sterile dressings were applied followed by a well-padded short leg splint.  The tourniquet was released after application of the dressings.  The patient was awakened from anesthesia and transported to the recovery room in stable condition.   FOLLOW UP PLAN: Nonweightbearing on the right lower extremity.  Aspirin for DVT prophylaxis.  Follow-up with me in the office in 2 weeks for suture removal and conversion to a short leg cast.   RADIOGRAPHS: AP, mortise and lateral radiographs of the right ankle are obtained intraoperatively.  These show interval reduction of the trimalleolar fracture dislocation.  Hardware is appropriately positioned and of the appropriate lengths.

## 2018-10-09 NOTE — Transfer of Care (Signed)
Immediate Anesthesia Transfer of Care Note  Patient: Audrey Cowan  Procedure(s) Performed: OPEN REDUCTION INTERNAL FIXATION (ORIF) Right ankle trimalleolar fracture (Right Ankle)  Patient Location: PACU  Anesthesia Type:General and Regional  Level of Consciousness: drowsy and patient cooperative  Airway & Oxygen Therapy: Patient Spontanous Breathing and Patient connected to nasal cannula oxygen  Post-op Assessment: Report given to RN and Post -op Vital signs reviewed and stable  Post vital signs: Reviewed and stable  Last Vitals:  Vitals Value Taken Time  BP 132/64 10/09/18 1503  Temp    Pulse 82 10/09/18 1507  Resp 8 10/09/18 1507  SpO2 98 % 10/09/18 1507  Vitals shown include unvalidated device data.  Last Pain: There were no vitals filed for this visit.       Complications: No apparent anesthesia complications

## 2018-10-09 NOTE — Anesthesia Procedure Notes (Signed)
Procedure Name: Intubation Date/Time: 10/09/2018 1:23 PM Performed by: Gwyndolyn Saxon, CRNA Pre-anesthesia Checklist: Patient identified, Emergency Drugs available, Suction available and Patient being monitored Patient Re-evaluated:Patient Re-evaluated prior to induction Oxygen Delivery Method: Circle System Utilized Preoxygenation: Pre-oxygenation with 100% oxygen Induction Type: IV induction Ventilation: Mask ventilation without difficulty Laryngoscope Size: Miller and 2 Grade View: Grade II Tube type: Oral Tube size: 7.0 mm Number of attempts: 1 Airway Equipment and Method: Stylet and Oral airway Placement Confirmation: ETT inserted through vocal cords under direct vision,  positive ETCO2 and breath sounds checked- equal and bilateral Secured at: 21 cm Tube secured with: Tape Dental Injury: Teeth and Oropharynx as per pre-operative assessment

## 2018-10-09 NOTE — Progress Notes (Signed)
Assisted Dr. Smith Robert with right, ultrasound guided, popliteal and adductor canal block. Side rails up, monitors on throughout procedure. See vital signs in flow sheet. Tolerated Procedure well.

## 2018-10-13 ENCOUNTER — Encounter (HOSPITAL_BASED_OUTPATIENT_CLINIC_OR_DEPARTMENT_OTHER): Payer: Self-pay | Admitting: Orthopedic Surgery

## 2018-10-19 DIAGNOSIS — S82851D Displaced trimalleolar fracture of right lower leg, subsequent encounter for closed fracture with routine healing: Secondary | ICD-10-CM | POA: Diagnosis not present

## 2018-10-19 DIAGNOSIS — N189 Chronic kidney disease, unspecified: Secondary | ICD-10-CM | POA: Diagnosis not present

## 2018-10-19 DIAGNOSIS — E7800 Pure hypercholesterolemia, unspecified: Secondary | ICD-10-CM | POA: Diagnosis not present

## 2018-10-19 DIAGNOSIS — Z87891 Personal history of nicotine dependence: Secondary | ICD-10-CM | POA: Diagnosis not present

## 2018-10-19 DIAGNOSIS — I129 Hypertensive chronic kidney disease with stage 1 through stage 4 chronic kidney disease, or unspecified chronic kidney disease: Secondary | ICD-10-CM | POA: Diagnosis not present

## 2018-10-19 DIAGNOSIS — Z4789 Encounter for other orthopedic aftercare: Secondary | ICD-10-CM | POA: Diagnosis not present

## 2018-10-19 DIAGNOSIS — E039 Hypothyroidism, unspecified: Secondary | ICD-10-CM | POA: Diagnosis not present

## 2018-10-22 DIAGNOSIS — E039 Hypothyroidism, unspecified: Secondary | ICD-10-CM | POA: Diagnosis not present

## 2018-10-22 DIAGNOSIS — I129 Hypertensive chronic kidney disease with stage 1 through stage 4 chronic kidney disease, or unspecified chronic kidney disease: Secondary | ICD-10-CM | POA: Diagnosis not present

## 2018-10-22 DIAGNOSIS — S82851D Displaced trimalleolar fracture of right lower leg, subsequent encounter for closed fracture with routine healing: Secondary | ICD-10-CM | POA: Diagnosis not present

## 2018-10-22 DIAGNOSIS — E7800 Pure hypercholesterolemia, unspecified: Secondary | ICD-10-CM | POA: Diagnosis not present

## 2018-10-22 DIAGNOSIS — N189 Chronic kidney disease, unspecified: Secondary | ICD-10-CM | POA: Diagnosis not present

## 2018-10-22 DIAGNOSIS — Z4789 Encounter for other orthopedic aftercare: Secondary | ICD-10-CM | POA: Diagnosis not present

## 2018-10-23 DIAGNOSIS — S82851D Displaced trimalleolar fracture of right lower leg, subsequent encounter for closed fracture with routine healing: Secondary | ICD-10-CM | POA: Diagnosis not present

## 2018-10-23 DIAGNOSIS — N189 Chronic kidney disease, unspecified: Secondary | ICD-10-CM | POA: Diagnosis not present

## 2018-10-23 DIAGNOSIS — Z4789 Encounter for other orthopedic aftercare: Secondary | ICD-10-CM | POA: Diagnosis not present

## 2018-10-23 DIAGNOSIS — E039 Hypothyroidism, unspecified: Secondary | ICD-10-CM | POA: Diagnosis not present

## 2018-10-23 DIAGNOSIS — I129 Hypertensive chronic kidney disease with stage 1 through stage 4 chronic kidney disease, or unspecified chronic kidney disease: Secondary | ICD-10-CM | POA: Diagnosis not present

## 2018-10-23 DIAGNOSIS — E7800 Pure hypercholesterolemia, unspecified: Secondary | ICD-10-CM | POA: Diagnosis not present

## 2018-10-24 DIAGNOSIS — E039 Hypothyroidism, unspecified: Secondary | ICD-10-CM | POA: Diagnosis not present

## 2018-10-24 DIAGNOSIS — I129 Hypertensive chronic kidney disease with stage 1 through stage 4 chronic kidney disease, or unspecified chronic kidney disease: Secondary | ICD-10-CM | POA: Diagnosis not present

## 2018-10-24 DIAGNOSIS — S82851D Displaced trimalleolar fracture of right lower leg, subsequent encounter for closed fracture with routine healing: Secondary | ICD-10-CM | POA: Diagnosis not present

## 2018-10-24 DIAGNOSIS — Z4789 Encounter for other orthopedic aftercare: Secondary | ICD-10-CM | POA: Diagnosis not present

## 2018-10-24 DIAGNOSIS — E7800 Pure hypercholesterolemia, unspecified: Secondary | ICD-10-CM | POA: Diagnosis not present

## 2018-10-24 DIAGNOSIS — N189 Chronic kidney disease, unspecified: Secondary | ICD-10-CM | POA: Diagnosis not present

## 2018-10-27 DIAGNOSIS — S82851D Displaced trimalleolar fracture of right lower leg, subsequent encounter for closed fracture with routine healing: Secondary | ICD-10-CM | POA: Diagnosis not present

## 2018-10-27 DIAGNOSIS — E039 Hypothyroidism, unspecified: Secondary | ICD-10-CM | POA: Diagnosis not present

## 2018-10-27 DIAGNOSIS — Z4789 Encounter for other orthopedic aftercare: Secondary | ICD-10-CM | POA: Diagnosis not present

## 2018-10-27 DIAGNOSIS — E7800 Pure hypercholesterolemia, unspecified: Secondary | ICD-10-CM | POA: Diagnosis not present

## 2018-10-27 DIAGNOSIS — I129 Hypertensive chronic kidney disease with stage 1 through stage 4 chronic kidney disease, or unspecified chronic kidney disease: Secondary | ICD-10-CM | POA: Diagnosis not present

## 2018-10-27 DIAGNOSIS — N189 Chronic kidney disease, unspecified: Secondary | ICD-10-CM | POA: Diagnosis not present

## 2018-10-28 DIAGNOSIS — E7800 Pure hypercholesterolemia, unspecified: Secondary | ICD-10-CM | POA: Diagnosis not present

## 2018-10-28 DIAGNOSIS — S82851D Displaced trimalleolar fracture of right lower leg, subsequent encounter for closed fracture with routine healing: Secondary | ICD-10-CM | POA: Diagnosis not present

## 2018-10-28 DIAGNOSIS — I129 Hypertensive chronic kidney disease with stage 1 through stage 4 chronic kidney disease, or unspecified chronic kidney disease: Secondary | ICD-10-CM | POA: Diagnosis not present

## 2018-10-28 DIAGNOSIS — E039 Hypothyroidism, unspecified: Secondary | ICD-10-CM | POA: Diagnosis not present

## 2018-10-28 DIAGNOSIS — Z4789 Encounter for other orthopedic aftercare: Secondary | ICD-10-CM | POA: Diagnosis not present

## 2018-10-28 DIAGNOSIS — N189 Chronic kidney disease, unspecified: Secondary | ICD-10-CM | POA: Diagnosis not present

## 2018-10-29 DIAGNOSIS — Z4789 Encounter for other orthopedic aftercare: Secondary | ICD-10-CM | POA: Diagnosis not present

## 2018-10-29 DIAGNOSIS — E039 Hypothyroidism, unspecified: Secondary | ICD-10-CM | POA: Diagnosis not present

## 2018-10-29 DIAGNOSIS — S82851D Displaced trimalleolar fracture of right lower leg, subsequent encounter for closed fracture with routine healing: Secondary | ICD-10-CM | POA: Diagnosis not present

## 2018-10-29 DIAGNOSIS — N189 Chronic kidney disease, unspecified: Secondary | ICD-10-CM | POA: Diagnosis not present

## 2018-10-29 DIAGNOSIS — E7800 Pure hypercholesterolemia, unspecified: Secondary | ICD-10-CM | POA: Diagnosis not present

## 2018-10-29 DIAGNOSIS — I129 Hypertensive chronic kidney disease with stage 1 through stage 4 chronic kidney disease, or unspecified chronic kidney disease: Secondary | ICD-10-CM | POA: Diagnosis not present

## 2018-10-30 DIAGNOSIS — N189 Chronic kidney disease, unspecified: Secondary | ICD-10-CM | POA: Diagnosis not present

## 2018-10-30 DIAGNOSIS — Z4789 Encounter for other orthopedic aftercare: Secondary | ICD-10-CM | POA: Diagnosis not present

## 2018-10-30 DIAGNOSIS — E039 Hypothyroidism, unspecified: Secondary | ICD-10-CM | POA: Diagnosis not present

## 2018-10-30 DIAGNOSIS — S82851D Displaced trimalleolar fracture of right lower leg, subsequent encounter for closed fracture with routine healing: Secondary | ICD-10-CM | POA: Diagnosis not present

## 2018-10-30 DIAGNOSIS — I129 Hypertensive chronic kidney disease with stage 1 through stage 4 chronic kidney disease, or unspecified chronic kidney disease: Secondary | ICD-10-CM | POA: Diagnosis not present

## 2018-10-30 DIAGNOSIS — E7800 Pure hypercholesterolemia, unspecified: Secondary | ICD-10-CM | POA: Diagnosis not present

## 2018-11-04 DIAGNOSIS — E7800 Pure hypercholesterolemia, unspecified: Secondary | ICD-10-CM | POA: Diagnosis not present

## 2018-11-04 DIAGNOSIS — I129 Hypertensive chronic kidney disease with stage 1 through stage 4 chronic kidney disease, or unspecified chronic kidney disease: Secondary | ICD-10-CM | POA: Diagnosis not present

## 2018-11-04 DIAGNOSIS — S82851D Displaced trimalleolar fracture of right lower leg, subsequent encounter for closed fracture with routine healing: Secondary | ICD-10-CM | POA: Diagnosis not present

## 2018-11-04 DIAGNOSIS — N189 Chronic kidney disease, unspecified: Secondary | ICD-10-CM | POA: Diagnosis not present

## 2018-11-04 DIAGNOSIS — E039 Hypothyroidism, unspecified: Secondary | ICD-10-CM | POA: Diagnosis not present

## 2018-11-04 DIAGNOSIS — Z4789 Encounter for other orthopedic aftercare: Secondary | ICD-10-CM | POA: Diagnosis not present

## 2018-11-05 DIAGNOSIS — Z4789 Encounter for other orthopedic aftercare: Secondary | ICD-10-CM | POA: Diagnosis not present

## 2018-11-05 DIAGNOSIS — I129 Hypertensive chronic kidney disease with stage 1 through stage 4 chronic kidney disease, or unspecified chronic kidney disease: Secondary | ICD-10-CM | POA: Diagnosis not present

## 2018-11-05 DIAGNOSIS — N189 Chronic kidney disease, unspecified: Secondary | ICD-10-CM | POA: Diagnosis not present

## 2018-11-05 DIAGNOSIS — E039 Hypothyroidism, unspecified: Secondary | ICD-10-CM | POA: Diagnosis not present

## 2018-11-05 DIAGNOSIS — S82851D Displaced trimalleolar fracture of right lower leg, subsequent encounter for closed fracture with routine healing: Secondary | ICD-10-CM | POA: Diagnosis not present

## 2018-11-05 DIAGNOSIS — E7800 Pure hypercholesterolemia, unspecified: Secondary | ICD-10-CM | POA: Diagnosis not present

## 2018-11-06 DIAGNOSIS — Z4789 Encounter for other orthopedic aftercare: Secondary | ICD-10-CM | POA: Diagnosis not present

## 2018-11-06 DIAGNOSIS — E7800 Pure hypercholesterolemia, unspecified: Secondary | ICD-10-CM | POA: Diagnosis not present

## 2018-11-06 DIAGNOSIS — I129 Hypertensive chronic kidney disease with stage 1 through stage 4 chronic kidney disease, or unspecified chronic kidney disease: Secondary | ICD-10-CM | POA: Diagnosis not present

## 2018-11-06 DIAGNOSIS — S82851D Displaced trimalleolar fracture of right lower leg, subsequent encounter for closed fracture with routine healing: Secondary | ICD-10-CM | POA: Diagnosis not present

## 2018-11-06 DIAGNOSIS — E039 Hypothyroidism, unspecified: Secondary | ICD-10-CM | POA: Diagnosis not present

## 2018-11-06 DIAGNOSIS — N189 Chronic kidney disease, unspecified: Secondary | ICD-10-CM | POA: Diagnosis not present

## 2018-11-11 DIAGNOSIS — S82851D Displaced trimalleolar fracture of right lower leg, subsequent encounter for closed fracture with routine healing: Secondary | ICD-10-CM | POA: Diagnosis not present

## 2018-11-11 DIAGNOSIS — E039 Hypothyroidism, unspecified: Secondary | ICD-10-CM | POA: Diagnosis not present

## 2018-11-11 DIAGNOSIS — E7800 Pure hypercholesterolemia, unspecified: Secondary | ICD-10-CM | POA: Diagnosis not present

## 2018-11-11 DIAGNOSIS — I129 Hypertensive chronic kidney disease with stage 1 through stage 4 chronic kidney disease, or unspecified chronic kidney disease: Secondary | ICD-10-CM | POA: Diagnosis not present

## 2018-11-11 DIAGNOSIS — N189 Chronic kidney disease, unspecified: Secondary | ICD-10-CM | POA: Diagnosis not present

## 2018-11-11 DIAGNOSIS — Z4789 Encounter for other orthopedic aftercare: Secondary | ICD-10-CM | POA: Diagnosis not present

## 2018-11-12 DIAGNOSIS — Z4789 Encounter for other orthopedic aftercare: Secondary | ICD-10-CM | POA: Diagnosis not present

## 2018-11-12 DIAGNOSIS — S82851D Displaced trimalleolar fracture of right lower leg, subsequent encounter for closed fracture with routine healing: Secondary | ICD-10-CM | POA: Diagnosis not present

## 2018-11-12 DIAGNOSIS — N189 Chronic kidney disease, unspecified: Secondary | ICD-10-CM | POA: Diagnosis not present

## 2018-11-12 DIAGNOSIS — I129 Hypertensive chronic kidney disease with stage 1 through stage 4 chronic kidney disease, or unspecified chronic kidney disease: Secondary | ICD-10-CM | POA: Diagnosis not present

## 2018-11-12 DIAGNOSIS — E7800 Pure hypercholesterolemia, unspecified: Secondary | ICD-10-CM | POA: Diagnosis not present

## 2018-11-12 DIAGNOSIS — E039 Hypothyroidism, unspecified: Secondary | ICD-10-CM | POA: Diagnosis not present

## 2018-11-13 DIAGNOSIS — N189 Chronic kidney disease, unspecified: Secondary | ICD-10-CM | POA: Diagnosis not present

## 2018-11-13 DIAGNOSIS — E039 Hypothyroidism, unspecified: Secondary | ICD-10-CM | POA: Diagnosis not present

## 2018-11-13 DIAGNOSIS — I129 Hypertensive chronic kidney disease with stage 1 through stage 4 chronic kidney disease, or unspecified chronic kidney disease: Secondary | ICD-10-CM | POA: Diagnosis not present

## 2018-11-13 DIAGNOSIS — R55 Syncope and collapse: Secondary | ICD-10-CM | POA: Diagnosis not present

## 2018-11-13 DIAGNOSIS — I10 Essential (primary) hypertension: Secondary | ICD-10-CM | POA: Diagnosis not present

## 2018-11-13 DIAGNOSIS — E7800 Pure hypercholesterolemia, unspecified: Secondary | ICD-10-CM | POA: Diagnosis not present

## 2018-11-13 DIAGNOSIS — S82851D Displaced trimalleolar fracture of right lower leg, subsequent encounter for closed fracture with routine healing: Secondary | ICD-10-CM | POA: Diagnosis not present

## 2018-11-13 DIAGNOSIS — Z4789 Encounter for other orthopedic aftercare: Secondary | ICD-10-CM | POA: Diagnosis not present

## 2018-11-13 DIAGNOSIS — F411 Generalized anxiety disorder: Secondary | ICD-10-CM | POA: Diagnosis not present

## 2018-11-24 DIAGNOSIS — Z5189 Encounter for other specified aftercare: Secondary | ICD-10-CM | POA: Diagnosis not present

## 2018-11-24 DIAGNOSIS — S82851D Displaced trimalleolar fracture of right lower leg, subsequent encounter for closed fracture with routine healing: Secondary | ICD-10-CM | POA: Diagnosis not present

## 2018-11-24 DIAGNOSIS — M25571 Pain in right ankle and joints of right foot: Secondary | ICD-10-CM | POA: Diagnosis not present

## 2018-12-17 DIAGNOSIS — E7800 Pure hypercholesterolemia, unspecified: Secondary | ICD-10-CM | POA: Diagnosis not present

## 2018-12-17 DIAGNOSIS — E039 Hypothyroidism, unspecified: Secondary | ICD-10-CM | POA: Diagnosis not present

## 2018-12-17 DIAGNOSIS — Z1389 Encounter for screening for other disorder: Secondary | ICD-10-CM | POA: Diagnosis not present

## 2018-12-17 DIAGNOSIS — M8580 Other specified disorders of bone density and structure, unspecified site: Secondary | ICD-10-CM | POA: Diagnosis not present

## 2018-12-17 DIAGNOSIS — I10 Essential (primary) hypertension: Secondary | ICD-10-CM | POA: Diagnosis not present

## 2018-12-17 DIAGNOSIS — F411 Generalized anxiety disorder: Secondary | ICD-10-CM | POA: Diagnosis not present

## 2018-12-17 DIAGNOSIS — Z0000 Encounter for general adult medical examination without abnormal findings: Secondary | ICD-10-CM | POA: Diagnosis not present

## 2018-12-18 DIAGNOSIS — E7800 Pure hypercholesterolemia, unspecified: Secondary | ICD-10-CM | POA: Diagnosis not present

## 2018-12-18 DIAGNOSIS — I129 Hypertensive chronic kidney disease with stage 1 through stage 4 chronic kidney disease, or unspecified chronic kidney disease: Secondary | ICD-10-CM | POA: Diagnosis not present

## 2018-12-18 DIAGNOSIS — Z79891 Long term (current) use of opiate analgesic: Secondary | ICD-10-CM | POA: Diagnosis not present

## 2018-12-18 DIAGNOSIS — S82851D Displaced trimalleolar fracture of right lower leg, subsequent encounter for closed fracture with routine healing: Secondary | ICD-10-CM | POA: Diagnosis not present

## 2018-12-18 DIAGNOSIS — N189 Chronic kidney disease, unspecified: Secondary | ICD-10-CM | POA: Diagnosis not present

## 2018-12-18 DIAGNOSIS — Z87891 Personal history of nicotine dependence: Secondary | ICD-10-CM | POA: Diagnosis not present

## 2018-12-18 DIAGNOSIS — E039 Hypothyroidism, unspecified: Secondary | ICD-10-CM | POA: Diagnosis not present

## 2018-12-22 DIAGNOSIS — M25571 Pain in right ankle and joints of right foot: Secondary | ICD-10-CM | POA: Diagnosis not present

## 2018-12-23 DIAGNOSIS — S82851D Displaced trimalleolar fracture of right lower leg, subsequent encounter for closed fracture with routine healing: Secondary | ICD-10-CM | POA: Diagnosis not present

## 2018-12-23 DIAGNOSIS — I129 Hypertensive chronic kidney disease with stage 1 through stage 4 chronic kidney disease, or unspecified chronic kidney disease: Secondary | ICD-10-CM | POA: Diagnosis not present

## 2018-12-23 DIAGNOSIS — N189 Chronic kidney disease, unspecified: Secondary | ICD-10-CM | POA: Diagnosis not present

## 2018-12-23 DIAGNOSIS — E7800 Pure hypercholesterolemia, unspecified: Secondary | ICD-10-CM | POA: Diagnosis not present

## 2018-12-23 DIAGNOSIS — E039 Hypothyroidism, unspecified: Secondary | ICD-10-CM | POA: Diagnosis not present

## 2018-12-26 DIAGNOSIS — E7800 Pure hypercholesterolemia, unspecified: Secondary | ICD-10-CM | POA: Diagnosis not present

## 2018-12-26 DIAGNOSIS — N189 Chronic kidney disease, unspecified: Secondary | ICD-10-CM | POA: Diagnosis not present

## 2018-12-26 DIAGNOSIS — E039 Hypothyroidism, unspecified: Secondary | ICD-10-CM | POA: Diagnosis not present

## 2018-12-26 DIAGNOSIS — I129 Hypertensive chronic kidney disease with stage 1 through stage 4 chronic kidney disease, or unspecified chronic kidney disease: Secondary | ICD-10-CM | POA: Diagnosis not present

## 2018-12-26 DIAGNOSIS — S82851D Displaced trimalleolar fracture of right lower leg, subsequent encounter for closed fracture with routine healing: Secondary | ICD-10-CM | POA: Diagnosis not present

## 2018-12-29 DIAGNOSIS — N189 Chronic kidney disease, unspecified: Secondary | ICD-10-CM | POA: Diagnosis not present

## 2018-12-29 DIAGNOSIS — S82851D Displaced trimalleolar fracture of right lower leg, subsequent encounter for closed fracture with routine healing: Secondary | ICD-10-CM | POA: Diagnosis not present

## 2018-12-29 DIAGNOSIS — I129 Hypertensive chronic kidney disease with stage 1 through stage 4 chronic kidney disease, or unspecified chronic kidney disease: Secondary | ICD-10-CM | POA: Diagnosis not present

## 2018-12-29 DIAGNOSIS — E7800 Pure hypercholesterolemia, unspecified: Secondary | ICD-10-CM | POA: Diagnosis not present

## 2018-12-29 DIAGNOSIS — E039 Hypothyroidism, unspecified: Secondary | ICD-10-CM | POA: Diagnosis not present

## 2018-12-30 DIAGNOSIS — Z23 Encounter for immunization: Secondary | ICD-10-CM | POA: Diagnosis not present

## 2019-01-01 DIAGNOSIS — E7800 Pure hypercholesterolemia, unspecified: Secondary | ICD-10-CM | POA: Diagnosis not present

## 2019-01-01 DIAGNOSIS — E039 Hypothyroidism, unspecified: Secondary | ICD-10-CM | POA: Diagnosis not present

## 2019-01-01 DIAGNOSIS — N189 Chronic kidney disease, unspecified: Secondary | ICD-10-CM | POA: Diagnosis not present

## 2019-01-01 DIAGNOSIS — S82851D Displaced trimalleolar fracture of right lower leg, subsequent encounter for closed fracture with routine healing: Secondary | ICD-10-CM | POA: Diagnosis not present

## 2019-01-01 DIAGNOSIS — I129 Hypertensive chronic kidney disease with stage 1 through stage 4 chronic kidney disease, or unspecified chronic kidney disease: Secondary | ICD-10-CM | POA: Diagnosis not present

## 2019-01-05 DIAGNOSIS — E039 Hypothyroidism, unspecified: Secondary | ICD-10-CM | POA: Diagnosis not present

## 2019-01-05 DIAGNOSIS — E7800 Pure hypercholesterolemia, unspecified: Secondary | ICD-10-CM | POA: Diagnosis not present

## 2019-01-05 DIAGNOSIS — N189 Chronic kidney disease, unspecified: Secondary | ICD-10-CM | POA: Diagnosis not present

## 2019-01-05 DIAGNOSIS — I129 Hypertensive chronic kidney disease with stage 1 through stage 4 chronic kidney disease, or unspecified chronic kidney disease: Secondary | ICD-10-CM | POA: Diagnosis not present

## 2019-01-05 DIAGNOSIS — S82851D Displaced trimalleolar fracture of right lower leg, subsequent encounter for closed fracture with routine healing: Secondary | ICD-10-CM | POA: Diagnosis not present

## 2019-01-06 ENCOUNTER — Other Ambulatory Visit: Payer: Self-pay | Admitting: Family Medicine

## 2019-01-06 DIAGNOSIS — Z1231 Encounter for screening mammogram for malignant neoplasm of breast: Secondary | ICD-10-CM

## 2019-01-09 DIAGNOSIS — E039 Hypothyroidism, unspecified: Secondary | ICD-10-CM | POA: Diagnosis not present

## 2019-01-09 DIAGNOSIS — I129 Hypertensive chronic kidney disease with stage 1 through stage 4 chronic kidney disease, or unspecified chronic kidney disease: Secondary | ICD-10-CM | POA: Diagnosis not present

## 2019-01-09 DIAGNOSIS — S82851D Displaced trimalleolar fracture of right lower leg, subsequent encounter for closed fracture with routine healing: Secondary | ICD-10-CM | POA: Diagnosis not present

## 2019-01-09 DIAGNOSIS — E7800 Pure hypercholesterolemia, unspecified: Secondary | ICD-10-CM | POA: Diagnosis not present

## 2019-01-09 DIAGNOSIS — N189 Chronic kidney disease, unspecified: Secondary | ICD-10-CM | POA: Diagnosis not present

## 2019-01-12 DIAGNOSIS — S82851D Displaced trimalleolar fracture of right lower leg, subsequent encounter for closed fracture with routine healing: Secondary | ICD-10-CM | POA: Diagnosis not present

## 2019-01-12 DIAGNOSIS — N189 Chronic kidney disease, unspecified: Secondary | ICD-10-CM | POA: Diagnosis not present

## 2019-01-12 DIAGNOSIS — E039 Hypothyroidism, unspecified: Secondary | ICD-10-CM | POA: Diagnosis not present

## 2019-01-12 DIAGNOSIS — E7800 Pure hypercholesterolemia, unspecified: Secondary | ICD-10-CM | POA: Diagnosis not present

## 2019-01-12 DIAGNOSIS — I129 Hypertensive chronic kidney disease with stage 1 through stage 4 chronic kidney disease, or unspecified chronic kidney disease: Secondary | ICD-10-CM | POA: Diagnosis not present

## 2019-01-16 DIAGNOSIS — E7800 Pure hypercholesterolemia, unspecified: Secondary | ICD-10-CM | POA: Diagnosis not present

## 2019-01-16 DIAGNOSIS — S82851D Displaced trimalleolar fracture of right lower leg, subsequent encounter for closed fracture with routine healing: Secondary | ICD-10-CM | POA: Diagnosis not present

## 2019-01-16 DIAGNOSIS — I129 Hypertensive chronic kidney disease with stage 1 through stage 4 chronic kidney disease, or unspecified chronic kidney disease: Secondary | ICD-10-CM | POA: Diagnosis not present

## 2019-01-16 DIAGNOSIS — N189 Chronic kidney disease, unspecified: Secondary | ICD-10-CM | POA: Diagnosis not present

## 2019-01-16 DIAGNOSIS — E039 Hypothyroidism, unspecified: Secondary | ICD-10-CM | POA: Diagnosis not present

## 2019-01-17 DIAGNOSIS — I129 Hypertensive chronic kidney disease with stage 1 through stage 4 chronic kidney disease, or unspecified chronic kidney disease: Secondary | ICD-10-CM | POA: Diagnosis not present

## 2019-01-17 DIAGNOSIS — E039 Hypothyroidism, unspecified: Secondary | ICD-10-CM | POA: Diagnosis not present

## 2019-01-17 DIAGNOSIS — N189 Chronic kidney disease, unspecified: Secondary | ICD-10-CM | POA: Diagnosis not present

## 2019-01-17 DIAGNOSIS — Z79891 Long term (current) use of opiate analgesic: Secondary | ICD-10-CM | POA: Diagnosis not present

## 2019-01-17 DIAGNOSIS — S82851D Displaced trimalleolar fracture of right lower leg, subsequent encounter for closed fracture with routine healing: Secondary | ICD-10-CM | POA: Diagnosis not present

## 2019-01-17 DIAGNOSIS — E7800 Pure hypercholesterolemia, unspecified: Secondary | ICD-10-CM | POA: Diagnosis not present

## 2019-01-17 DIAGNOSIS — Z87891 Personal history of nicotine dependence: Secondary | ICD-10-CM | POA: Diagnosis not present

## 2019-01-21 DIAGNOSIS — M25571 Pain in right ankle and joints of right foot: Secondary | ICD-10-CM | POA: Diagnosis not present

## 2019-01-21 DIAGNOSIS — Z09 Encounter for follow-up examination after completed treatment for conditions other than malignant neoplasm: Secondary | ICD-10-CM | POA: Diagnosis not present

## 2019-01-21 DIAGNOSIS — S82851D Displaced trimalleolar fracture of right lower leg, subsequent encounter for closed fracture with routine healing: Secondary | ICD-10-CM | POA: Diagnosis not present

## 2019-01-26 DIAGNOSIS — I129 Hypertensive chronic kidney disease with stage 1 through stage 4 chronic kidney disease, or unspecified chronic kidney disease: Secondary | ICD-10-CM | POA: Diagnosis not present

## 2019-01-26 DIAGNOSIS — N189 Chronic kidney disease, unspecified: Secondary | ICD-10-CM | POA: Diagnosis not present

## 2019-01-26 DIAGNOSIS — E039 Hypothyroidism, unspecified: Secondary | ICD-10-CM | POA: Diagnosis not present

## 2019-01-26 DIAGNOSIS — S82851D Displaced trimalleolar fracture of right lower leg, subsequent encounter for closed fracture with routine healing: Secondary | ICD-10-CM | POA: Diagnosis not present

## 2019-01-26 DIAGNOSIS — E7800 Pure hypercholesterolemia, unspecified: Secondary | ICD-10-CM | POA: Diagnosis not present

## 2019-01-30 DIAGNOSIS — E7800 Pure hypercholesterolemia, unspecified: Secondary | ICD-10-CM | POA: Diagnosis not present

## 2019-01-30 DIAGNOSIS — S82851D Displaced trimalleolar fracture of right lower leg, subsequent encounter for closed fracture with routine healing: Secondary | ICD-10-CM | POA: Diagnosis not present

## 2019-01-30 DIAGNOSIS — E039 Hypothyroidism, unspecified: Secondary | ICD-10-CM | POA: Diagnosis not present

## 2019-01-30 DIAGNOSIS — N189 Chronic kidney disease, unspecified: Secondary | ICD-10-CM | POA: Diagnosis not present

## 2019-01-30 DIAGNOSIS — I129 Hypertensive chronic kidney disease with stage 1 through stage 4 chronic kidney disease, or unspecified chronic kidney disease: Secondary | ICD-10-CM | POA: Diagnosis not present

## 2019-02-02 DIAGNOSIS — E039 Hypothyroidism, unspecified: Secondary | ICD-10-CM | POA: Diagnosis not present

## 2019-02-02 DIAGNOSIS — I129 Hypertensive chronic kidney disease with stage 1 through stage 4 chronic kidney disease, or unspecified chronic kidney disease: Secondary | ICD-10-CM | POA: Diagnosis not present

## 2019-02-02 DIAGNOSIS — E7800 Pure hypercholesterolemia, unspecified: Secondary | ICD-10-CM | POA: Diagnosis not present

## 2019-02-02 DIAGNOSIS — N189 Chronic kidney disease, unspecified: Secondary | ICD-10-CM | POA: Diagnosis not present

## 2019-02-02 DIAGNOSIS — S82851D Displaced trimalleolar fracture of right lower leg, subsequent encounter for closed fracture with routine healing: Secondary | ICD-10-CM | POA: Diagnosis not present

## 2019-02-06 DIAGNOSIS — E039 Hypothyroidism, unspecified: Secondary | ICD-10-CM | POA: Diagnosis not present

## 2019-02-06 DIAGNOSIS — N189 Chronic kidney disease, unspecified: Secondary | ICD-10-CM | POA: Diagnosis not present

## 2019-02-06 DIAGNOSIS — I129 Hypertensive chronic kidney disease with stage 1 through stage 4 chronic kidney disease, or unspecified chronic kidney disease: Secondary | ICD-10-CM | POA: Diagnosis not present

## 2019-02-06 DIAGNOSIS — E7800 Pure hypercholesterolemia, unspecified: Secondary | ICD-10-CM | POA: Diagnosis not present

## 2019-02-06 DIAGNOSIS — S82851D Displaced trimalleolar fracture of right lower leg, subsequent encounter for closed fracture with routine healing: Secondary | ICD-10-CM | POA: Diagnosis not present

## 2019-02-09 DIAGNOSIS — E7800 Pure hypercholesterolemia, unspecified: Secondary | ICD-10-CM | POA: Diagnosis not present

## 2019-02-09 DIAGNOSIS — N189 Chronic kidney disease, unspecified: Secondary | ICD-10-CM | POA: Diagnosis not present

## 2019-02-09 DIAGNOSIS — E039 Hypothyroidism, unspecified: Secondary | ICD-10-CM | POA: Diagnosis not present

## 2019-02-09 DIAGNOSIS — S82851D Displaced trimalleolar fracture of right lower leg, subsequent encounter for closed fracture with routine healing: Secondary | ICD-10-CM | POA: Diagnosis not present

## 2019-02-09 DIAGNOSIS — I129 Hypertensive chronic kidney disease with stage 1 through stage 4 chronic kidney disease, or unspecified chronic kidney disease: Secondary | ICD-10-CM | POA: Diagnosis not present

## 2019-02-12 DIAGNOSIS — E039 Hypothyroidism, unspecified: Secondary | ICD-10-CM | POA: Diagnosis not present

## 2019-02-12 DIAGNOSIS — S82851D Displaced trimalleolar fracture of right lower leg, subsequent encounter for closed fracture with routine healing: Secondary | ICD-10-CM | POA: Diagnosis not present

## 2019-02-12 DIAGNOSIS — I129 Hypertensive chronic kidney disease with stage 1 through stage 4 chronic kidney disease, or unspecified chronic kidney disease: Secondary | ICD-10-CM | POA: Diagnosis not present

## 2019-02-12 DIAGNOSIS — N189 Chronic kidney disease, unspecified: Secondary | ICD-10-CM | POA: Diagnosis not present

## 2019-02-12 DIAGNOSIS — E7800 Pure hypercholesterolemia, unspecified: Secondary | ICD-10-CM | POA: Diagnosis not present

## 2019-02-16 DIAGNOSIS — S82851D Displaced trimalleolar fracture of right lower leg, subsequent encounter for closed fracture with routine healing: Secondary | ICD-10-CM | POA: Diagnosis not present

## 2019-02-16 DIAGNOSIS — I129 Hypertensive chronic kidney disease with stage 1 through stage 4 chronic kidney disease, or unspecified chronic kidney disease: Secondary | ICD-10-CM | POA: Diagnosis not present

## 2019-02-16 DIAGNOSIS — Z7982 Long term (current) use of aspirin: Secondary | ICD-10-CM | POA: Diagnosis not present

## 2019-02-16 DIAGNOSIS — Z87891 Personal history of nicotine dependence: Secondary | ICD-10-CM | POA: Diagnosis not present

## 2019-02-16 DIAGNOSIS — Z9181 History of falling: Secondary | ICD-10-CM | POA: Diagnosis not present

## 2019-02-16 DIAGNOSIS — E7800 Pure hypercholesterolemia, unspecified: Secondary | ICD-10-CM | POA: Diagnosis not present

## 2019-02-16 DIAGNOSIS — N189 Chronic kidney disease, unspecified: Secondary | ICD-10-CM | POA: Diagnosis not present

## 2019-02-16 DIAGNOSIS — E039 Hypothyroidism, unspecified: Secondary | ICD-10-CM | POA: Diagnosis not present

## 2019-02-17 DIAGNOSIS — S82851D Displaced trimalleolar fracture of right lower leg, subsequent encounter for closed fracture with routine healing: Secondary | ICD-10-CM | POA: Diagnosis not present

## 2019-02-17 DIAGNOSIS — E7800 Pure hypercholesterolemia, unspecified: Secondary | ICD-10-CM | POA: Diagnosis not present

## 2019-02-17 DIAGNOSIS — N189 Chronic kidney disease, unspecified: Secondary | ICD-10-CM | POA: Diagnosis not present

## 2019-02-17 DIAGNOSIS — I129 Hypertensive chronic kidney disease with stage 1 through stage 4 chronic kidney disease, or unspecified chronic kidney disease: Secondary | ICD-10-CM | POA: Diagnosis not present

## 2019-02-17 DIAGNOSIS — E039 Hypothyroidism, unspecified: Secondary | ICD-10-CM | POA: Diagnosis not present

## 2019-02-19 DIAGNOSIS — M25571 Pain in right ankle and joints of right foot: Secondary | ICD-10-CM | POA: Diagnosis not present

## 2019-02-19 DIAGNOSIS — S82851D Displaced trimalleolar fracture of right lower leg, subsequent encounter for closed fracture with routine healing: Secondary | ICD-10-CM | POA: Diagnosis not present

## 2019-02-19 DIAGNOSIS — Z09 Encounter for follow-up examination after completed treatment for conditions other than malignant neoplasm: Secondary | ICD-10-CM | POA: Diagnosis not present

## 2019-02-20 ENCOUNTER — Other Ambulatory Visit: Payer: Self-pay

## 2019-02-20 ENCOUNTER — Ambulatory Visit
Admission: RE | Admit: 2019-02-20 | Discharge: 2019-02-20 | Disposition: A | Discharge status: Home / Self Care | Payer: Medicare Other | Source: Ambulatory Visit | Attending: Family Medicine | Admitting: Family Medicine

## 2019-02-20 DIAGNOSIS — E039 Hypothyroidism, unspecified: Secondary | ICD-10-CM | POA: Diagnosis not present

## 2019-02-20 DIAGNOSIS — E7800 Pure hypercholesterolemia, unspecified: Secondary | ICD-10-CM | POA: Diagnosis not present

## 2019-02-20 DIAGNOSIS — N189 Chronic kidney disease, unspecified: Secondary | ICD-10-CM | POA: Diagnosis not present

## 2019-02-20 DIAGNOSIS — Z1231 Encounter for screening mammogram for malignant neoplasm of breast: Secondary | ICD-10-CM

## 2019-02-20 DIAGNOSIS — I129 Hypertensive chronic kidney disease with stage 1 through stage 4 chronic kidney disease, or unspecified chronic kidney disease: Secondary | ICD-10-CM | POA: Diagnosis not present

## 2019-02-20 DIAGNOSIS — S82851D Displaced trimalleolar fracture of right lower leg, subsequent encounter for closed fracture with routine healing: Secondary | ICD-10-CM | POA: Diagnosis not present

## 2019-02-20 IMAGING — MG DIGITAL SCREENING BILAT W/ TOMO W/ CAD
8 series · 8 of 24 positions shown · non-contrast
Comparison: Previous exam(s).

CLINICAL DATA: Screening.

EXAM:
DIGITAL SCREENING BILATERAL MAMMOGRAM WITH TOMO AND CAD

[R MLO synth-2D]
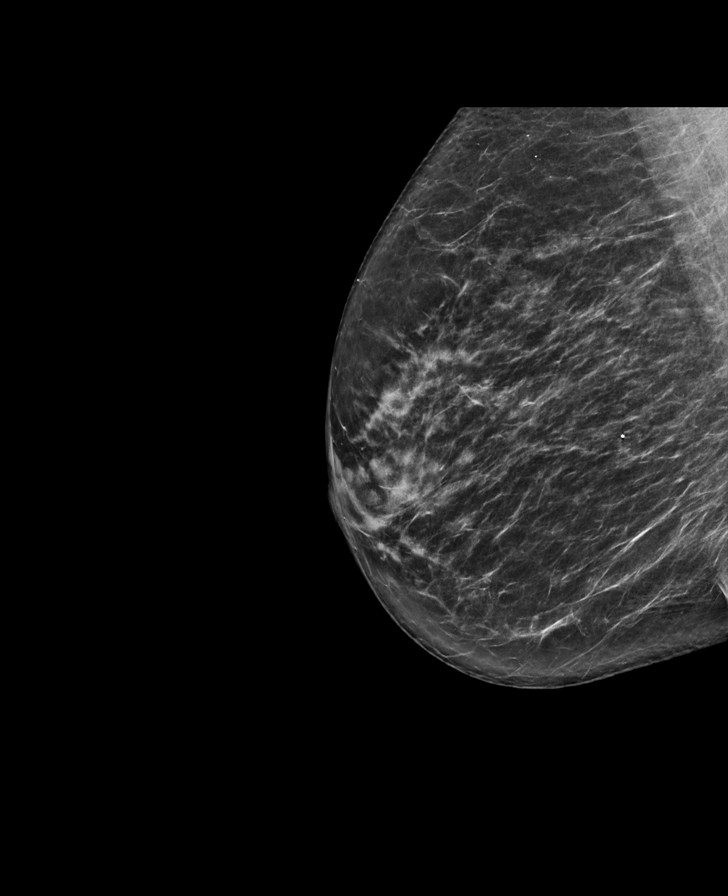

[L MLO synth-2D]
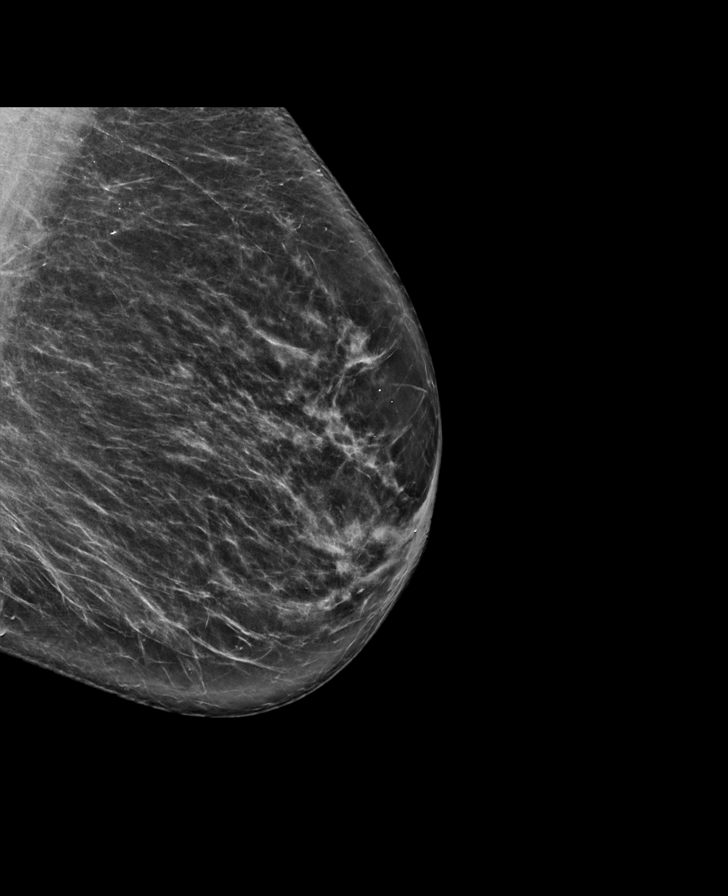

[R CC synth-2D]
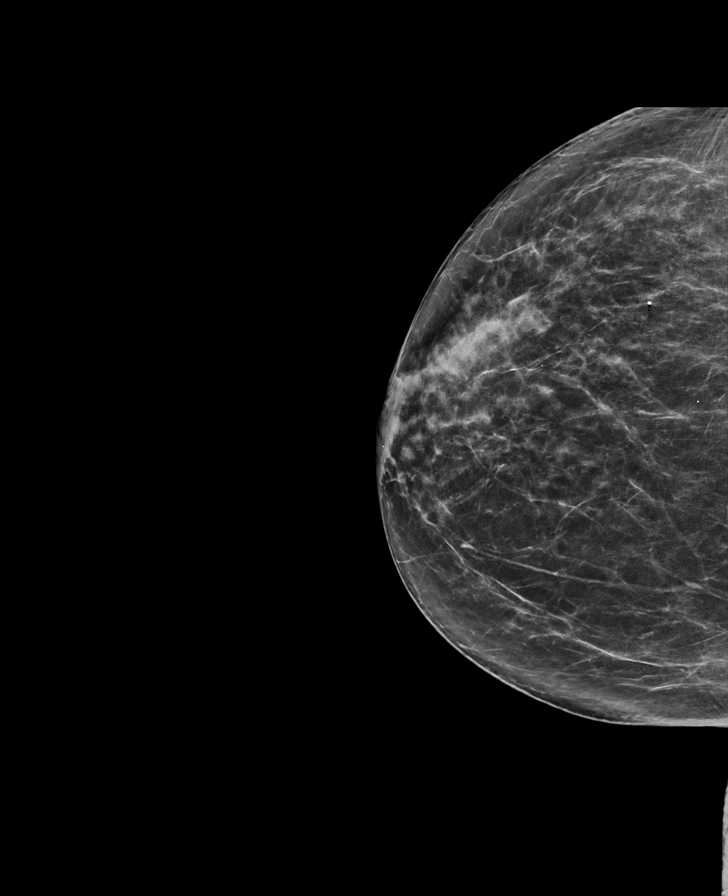

[L CC synth-2D]
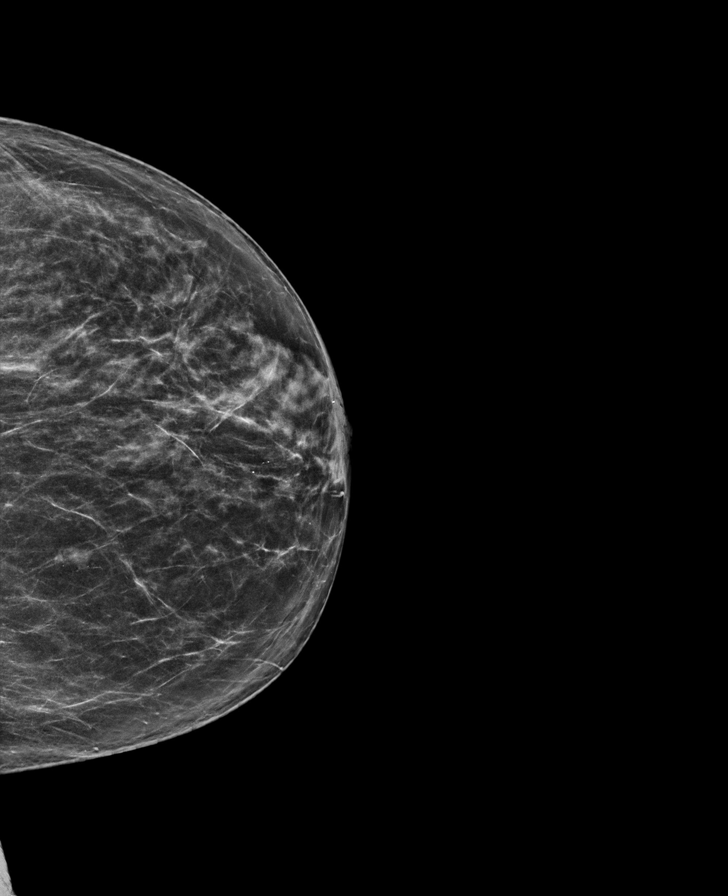

[R MLO tomo · tomo slice 37/73.0]
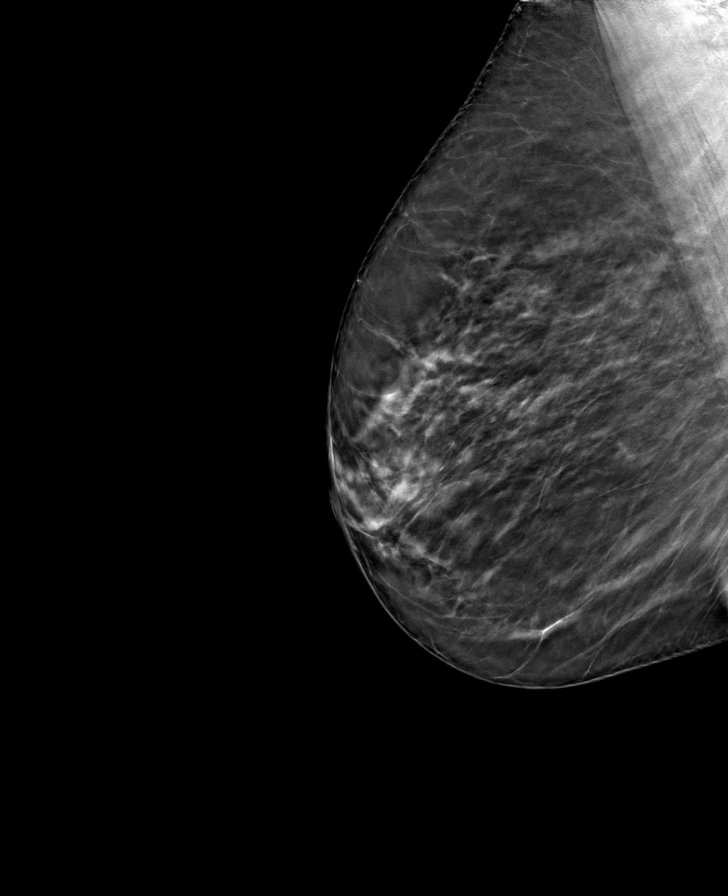

[L MLO tomo · tomo slice 40/79.0]
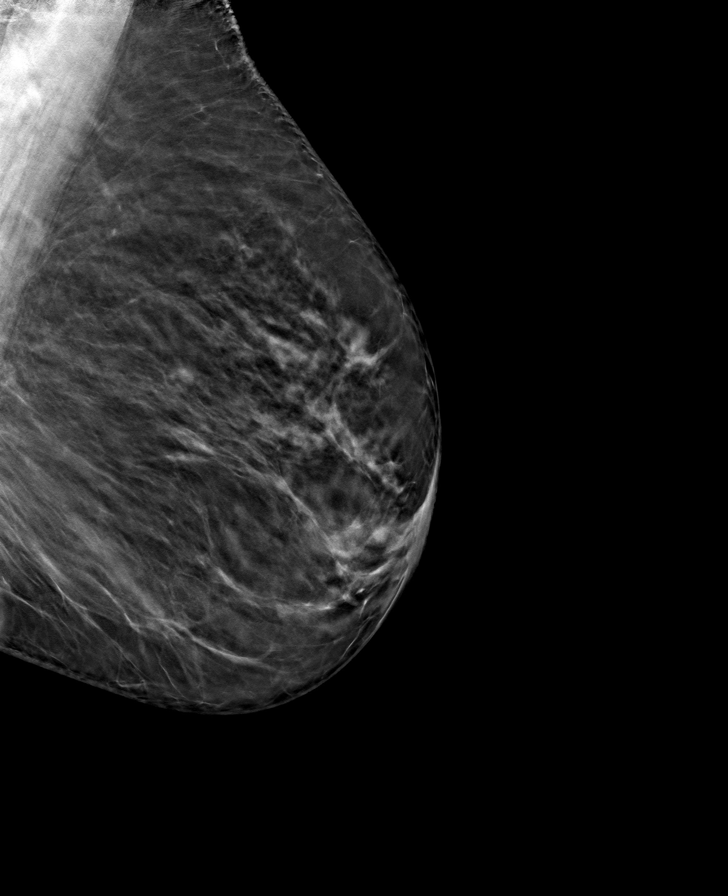

[L CC tomo · tomo slice 35/69.0]
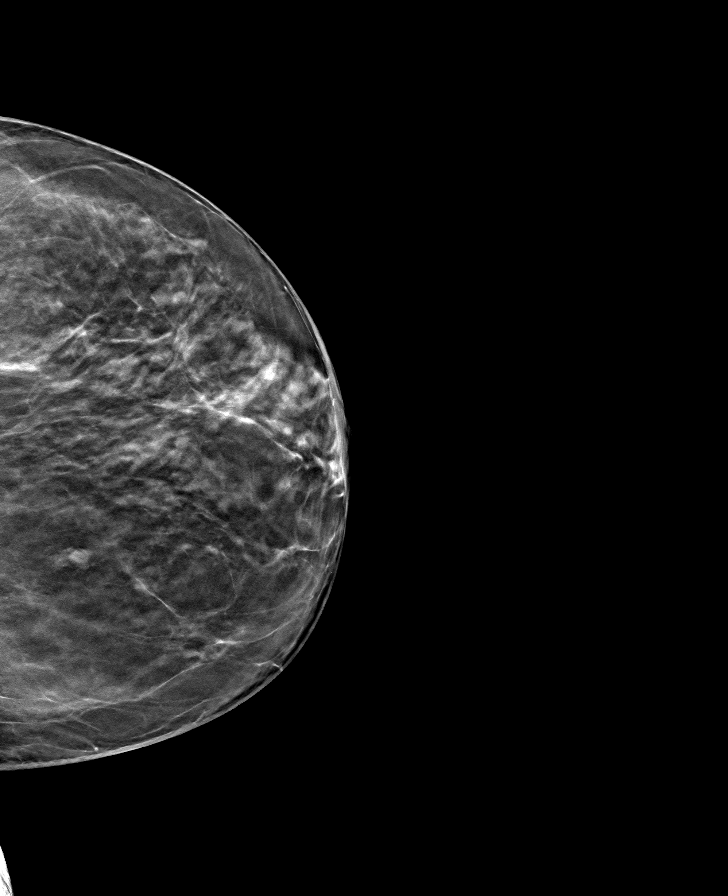

[R CC tomo · tomo slice 35/69.0]
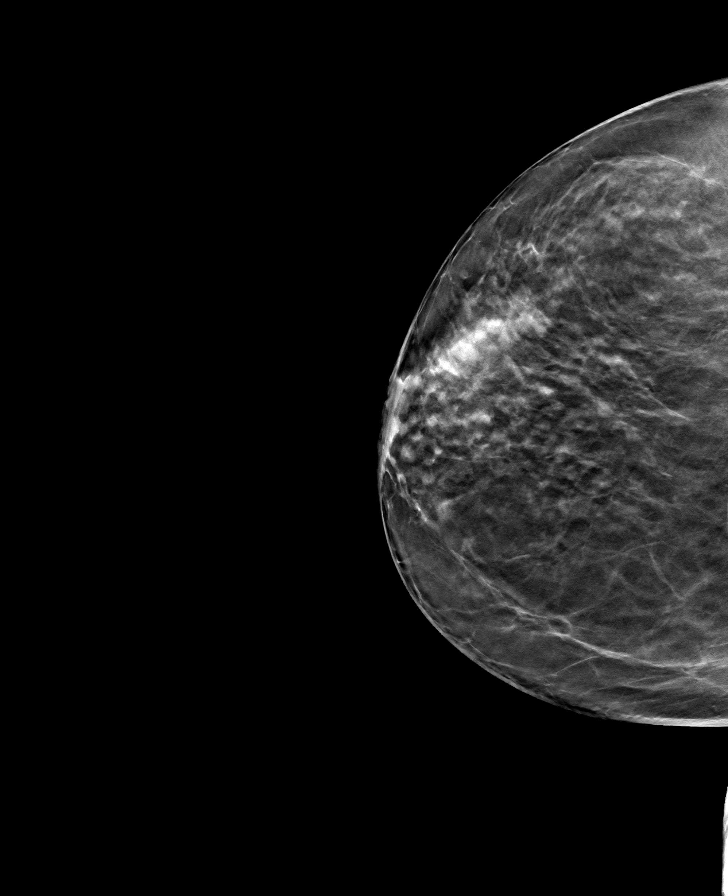

[8 of 24 positions shown; findings below may reference images not displayed]

ACR Breast Density Category c: The breast tissue is heterogeneously
dense, which may obscure small masses.
FINDINGS: There are no findings suspicious for malignancy. Images were
processed with CAD.
IMPRESSION: No mammographic evidence of malignancy. A result letter of this
screening mammogram will be mailed directly to the patient.

RECOMMENDATION:
Screening mammogram in one year. (Code:[5V])

BI-RADS CATEGORY  1: Negative.

## 2019-02-23 DIAGNOSIS — R6889 Other general symptoms and signs: Secondary | ICD-10-CM | POA: Diagnosis not present

## 2019-02-23 DIAGNOSIS — N189 Chronic kidney disease, unspecified: Secondary | ICD-10-CM | POA: Diagnosis not present

## 2019-02-23 DIAGNOSIS — S82851D Displaced trimalleolar fracture of right lower leg, subsequent encounter for closed fracture with routine healing: Secondary | ICD-10-CM | POA: Diagnosis not present

## 2019-02-23 DIAGNOSIS — E039 Hypothyroidism, unspecified: Secondary | ICD-10-CM | POA: Diagnosis not present

## 2019-02-23 DIAGNOSIS — R634 Abnormal weight loss: Secondary | ICD-10-CM | POA: Diagnosis not present

## 2019-02-23 DIAGNOSIS — I129 Hypertensive chronic kidney disease with stage 1 through stage 4 chronic kidney disease, or unspecified chronic kidney disease: Secondary | ICD-10-CM | POA: Diagnosis not present

## 2019-02-23 DIAGNOSIS — E7800 Pure hypercholesterolemia, unspecified: Secondary | ICD-10-CM | POA: Diagnosis not present

## 2019-02-24 DIAGNOSIS — R6889 Other general symptoms and signs: Secondary | ICD-10-CM | POA: Diagnosis not present

## 2019-02-24 DIAGNOSIS — R208 Other disturbances of skin sensation: Secondary | ICD-10-CM | POA: Diagnosis not present

## 2019-02-24 DIAGNOSIS — R634 Abnormal weight loss: Secondary | ICD-10-CM | POA: Diagnosis not present

## 2019-02-25 ENCOUNTER — Other Ambulatory Visit: Payer: Self-pay | Admitting: Family Medicine

## 2019-02-25 DIAGNOSIS — R6889 Other general symptoms and signs: Secondary | ICD-10-CM

## 2019-02-25 DIAGNOSIS — R209 Unspecified disturbances of skin sensation: Secondary | ICD-10-CM

## 2019-02-27 DIAGNOSIS — N189 Chronic kidney disease, unspecified: Secondary | ICD-10-CM | POA: Diagnosis not present

## 2019-02-27 DIAGNOSIS — S82851D Displaced trimalleolar fracture of right lower leg, subsequent encounter for closed fracture with routine healing: Secondary | ICD-10-CM | POA: Diagnosis not present

## 2019-02-27 DIAGNOSIS — I129 Hypertensive chronic kidney disease with stage 1 through stage 4 chronic kidney disease, or unspecified chronic kidney disease: Secondary | ICD-10-CM | POA: Diagnosis not present

## 2019-02-27 DIAGNOSIS — E7800 Pure hypercholesterolemia, unspecified: Secondary | ICD-10-CM | POA: Diagnosis not present

## 2019-02-27 DIAGNOSIS — E039 Hypothyroidism, unspecified: Secondary | ICD-10-CM | POA: Diagnosis not present

## 2019-03-02 DIAGNOSIS — E7800 Pure hypercholesterolemia, unspecified: Secondary | ICD-10-CM | POA: Diagnosis not present

## 2019-03-02 DIAGNOSIS — N189 Chronic kidney disease, unspecified: Secondary | ICD-10-CM | POA: Diagnosis not present

## 2019-03-02 DIAGNOSIS — S82851D Displaced trimalleolar fracture of right lower leg, subsequent encounter for closed fracture with routine healing: Secondary | ICD-10-CM | POA: Diagnosis not present

## 2019-03-02 DIAGNOSIS — E039 Hypothyroidism, unspecified: Secondary | ICD-10-CM | POA: Diagnosis not present

## 2019-03-02 DIAGNOSIS — I129 Hypertensive chronic kidney disease with stage 1 through stage 4 chronic kidney disease, or unspecified chronic kidney disease: Secondary | ICD-10-CM | POA: Diagnosis not present

## 2019-03-03 ENCOUNTER — Ambulatory Visit
Admission: RE | Admit: 2019-03-03 | Discharge: 2019-03-03 | Disposition: A | Discharge status: Home / Self Care | Payer: Medicare Other | Source: Ambulatory Visit | Attending: Family Medicine | Admitting: Family Medicine

## 2019-03-03 DIAGNOSIS — R209 Unspecified disturbances of skin sensation: Secondary | ICD-10-CM

## 2019-03-03 DIAGNOSIS — R200 Anesthesia of skin: Secondary | ICD-10-CM | POA: Diagnosis not present

## 2019-03-03 DIAGNOSIS — R6889 Other general symptoms and signs: Secondary | ICD-10-CM

## 2019-03-03 DIAGNOSIS — E538 Deficiency of other specified B group vitamins: Secondary | ICD-10-CM | POA: Diagnosis not present

## 2019-03-06 DIAGNOSIS — E7800 Pure hypercholesterolemia, unspecified: Secondary | ICD-10-CM | POA: Diagnosis not present

## 2019-03-06 DIAGNOSIS — N189 Chronic kidney disease, unspecified: Secondary | ICD-10-CM | POA: Diagnosis not present

## 2019-03-06 DIAGNOSIS — E039 Hypothyroidism, unspecified: Secondary | ICD-10-CM | POA: Diagnosis not present

## 2019-03-06 DIAGNOSIS — I129 Hypertensive chronic kidney disease with stage 1 through stage 4 chronic kidney disease, or unspecified chronic kidney disease: Secondary | ICD-10-CM | POA: Diagnosis not present

## 2019-03-06 DIAGNOSIS — S82851D Displaced trimalleolar fracture of right lower leg, subsequent encounter for closed fracture with routine healing: Secondary | ICD-10-CM | POA: Diagnosis not present

## 2019-03-10 DIAGNOSIS — R634 Abnormal weight loss: Secondary | ICD-10-CM | POA: Diagnosis not present

## 2019-03-13 DIAGNOSIS — E039 Hypothyroidism, unspecified: Secondary | ICD-10-CM | POA: Diagnosis not present

## 2019-03-13 DIAGNOSIS — N189 Chronic kidney disease, unspecified: Secondary | ICD-10-CM | POA: Diagnosis not present

## 2019-03-13 DIAGNOSIS — E7800 Pure hypercholesterolemia, unspecified: Secondary | ICD-10-CM | POA: Diagnosis not present

## 2019-03-13 DIAGNOSIS — I129 Hypertensive chronic kidney disease with stage 1 through stage 4 chronic kidney disease, or unspecified chronic kidney disease: Secondary | ICD-10-CM | POA: Diagnosis not present

## 2019-03-13 DIAGNOSIS — S82851D Displaced trimalleolar fracture of right lower leg, subsequent encounter for closed fracture with routine healing: Secondary | ICD-10-CM | POA: Diagnosis not present

## 2019-03-16 ENCOUNTER — Encounter: Payer: Self-pay | Admitting: Neurology

## 2019-03-17 DIAGNOSIS — N183 Chronic kidney disease, stage 3 unspecified: Secondary | ICD-10-CM | POA: Diagnosis not present

## 2019-03-17 DIAGNOSIS — E538 Deficiency of other specified B group vitamins: Secondary | ICD-10-CM | POA: Diagnosis not present

## 2019-03-26 ENCOUNTER — Other Ambulatory Visit: Payer: Self-pay

## 2019-03-26 NOTE — Progress Notes (Signed)
[facility] Clinic Note - Initial Visit   Date: 03/27/19  Audrey Cowan MRN: [id number] DOB: 1941/03/10   Dear Dr. Tamala Julian:  Thank you for your kind referral of Audrey Cowan for consultation of neuropathy. Although her history is well known to you, please allow [place] to reiterate it for the purpose of our medical record. The patient was accompanied to the clinic by son who also provides collateral information.     History of Present Illness: Audrey Cowan is a 78 y.o. right-handed female with hypothyroidism, hyperlipidemia, anxiety, CKD stage 3, hypertension, and vitamin B12 deficiency presenting for evaluation of paresthesias of the hands and feet.   She broke her ankle in June 2020 an was immobilized for a few weeks.  Starting in [place], she began having numbness of the feet and hands.  Symptoms started at the same time and has been constant since onset.  She does not have pain, but finds it uncomfortable.  She also complains of imbalance and has been using a walker for the past several months.  When she raised these concerns to her PCP, labs were checked and she was found to have severe vitamin B12 deficiency (B12 < 50).  Upon further questioning, she reports having poor appetite and eating small meals, but has always been like that.  She started weekly injections about a month ago but has not appreciated any change in numbness or balance. She does not drink alcohol or have history of diabetes.  She is now living with her son.  Out-side paper records, electronic medical record, and images have been reviewed where available and summarized as:  Labs 02/25/2019:  Vitamin B12 <50*, folate > 20.0, TSH 1.24   Past Medical History:  Diagnosis Date  . Chronic kidney disease    CKD  . Hypercholesteremia   . Hypertension   . Hypothyroidism   . Trimalleolar fracture    RIGHT ANKLE    Past Surgical History:  Procedure Laterality Date  . ORIF  ANKLE FRACTURE Right 10/09/2018   Procedure: OPEN REDUCTION INTERNAL FIXATION (ORIF) Right ankle trimalleolar fracture;  Surgeon: Wylene Simmer, MD;  Location: [facility];  Service: Orthopedics;  Laterality: Right;  51min     Medications:  Outpatient Encounter Medications as of 03/27/2019  Medication Sig  . amLODipine (NORVASC) 2.5 MG tablet Take 2.5 mg by mouth daily.  Marland Kitchen aspirin EC 81 MG tablet Take 81 mg by mouth every other day.  Marland Kitchen atorvastatin (LIPITOR) 20 MG tablet Take 20 mg by mouth daily.  . benazepril (LOTENSIN) 40 MG tablet Take 40 mg by mouth daily.  . Calcium Carbonate-Vitamin D (CALTRATE 600+D PO) Take by mouth.  . cholecalciferol (VITAMIN D3) 25 MCG (1000 UT) tablet Take 1,000 Units by mouth daily.  . furosemide (LASIX) 20 MG tablet Take 20 mg by mouth. PT TAKES 1/2 TAB EVERY OTHER DAY  . Grape Seed 100 MG CAPS Take by mouth. MUSCADINE SEED FOR PSORIASIS  . levothyroxine (SYNTHROID) 88 MCG tablet Take 88 mcg by mouth daily before breakfast.  . [DISCONTINUED] ondansetron (ZOFRAN) 4 MG tablet Take 4 mg by mouth every 8 (eight) hours as needed for nausea or vomiting.  . [DISCONTINUED] oxyCODONE (ROXICODONE) 5 MG immediate release tablet Take 1 tablet (5 mg total) by mouth every 6 (six) hours as needed for severe pain.   No facility-administered encounter medications on file as of 03/27/2019.    Allergies: No Known Allergies  Family History: Family History  Problem  Relation Age of Onset  . Leukemia Sister     Social History: Social History   Tobacco Use  . Smoking status: Never Smoker  . Smokeless tobacco: Never Used  Substance Use Topics  . Alcohol use: Not Currently  . Drug use: Never   Social History   Social History Narrative   Single story home with son   Right handed    Review of Systems:  CONSTITUTIONAL: No fevers, chills, night sweats, or weight loss.   EYES: No visual changes or eye pain ENT: No hearing changes.  No history of nose  bleeds.   RESPIRATORY: No cough, wheezing and shortness of breath.   CARDIOVASCULAR: Negative for chest pain, and palpitations.   GI: Negative for abdominal discomfort, blood in stools or black stools.  No recent change in bowel habits.   GU:  No history of incontinence.   MUSCLOSKELETAL: +history of joint pain or swelling.  No myalgias.   SKIN: Negative for lesions, rash, and itching.   HEMATOLOGY/ONCOLOGY: Negative for prolonged bleeding, bruising easily, and swollen nodes.  No history of cancer.   ENDOCRINE: Negative for cold or heat intolerance, polydipsia or goiter.   PSYCH:  No depression or anxiety symptoms.   NEURO: As Above.   Vital Signs:  BP (!) 184/76 Comment: Patient PCP aware  Pulse 78   Ht 5\' 5"  (1.651 m)   Wt 140 lb (63.5 kg)   SpO2 99%   BMI 23.30 kg/m    General Medical Exam:   General:  Elderly appearing, comfortable.   Eyes/ENT: see cranial nerve examination.   Neck:   No carotid bruits. Respiratory:  Clear to auscultation, good air entry bilaterally.   Cardiac:  Regular rate and rhythm, no murmur.   Extremities:  No deformities, edema, or skin discoloration.  Skin:  No rashes or lesions.  Neurological Exam: MENTAL STATUS including orientation to time, place, person, recent and remote memory, attention span and concentration, language, and fund of knowledge is normal.  Speech is not dysarthric.  CRANIAL NERVES: II:  No visual field defects.   III-IV-VI: Pupils equal round and reactive to light.  Normal conjugate, extra-ocular eye movements in all directions of gaze.  No nystagmus.  No ptosis.   V:  Normal facial sensation.    VII:  Normal facial symmetry and movements.   VIII:  Reduced hearing bilaterally.   IX-X:  Normal palatal movement.   XI:  Normal shoulder shrug and head rotation.   XII:  Normal tongue strength and range of motion, no deviation or fasciculation.  MOTOR:  No atrophy, fasciculations or abnormal movements.  No pronator drift.    Upper Extremity:  Right  Left  Deltoid  5/5   5/5   Biceps  5/5   5/5   Triceps  5/5   5/5   Infraspinatus 5/5  5/5  Medial pectoralis 5/5  5/5  Wrist extensors  5/5   5/5   Wrist flexors  5/5   5/5   Finger extensors  5/5   5/5   Finger flexors  5/5   5/5   Dorsal interossei  5/5   5/5   Abductor pollicis  5/5   5/5   Tone (Ashworth scale)  0  0   Lower Extremity:  Right  Left  Hip flexors  5/5   5/5   Hip extensors  5/5   5/5   Adductor 5/5  5/5  Abductor 5/5  5/5  Knee flexors  5/5  5/5   Knee extensors  5/5   5/5   Dorsiflexors  5/5   5/5   Plantarflexors  5/5   5/5   Toe extensors  5/5   5/5   Toe flexors  5/5   5/5   Tone (Ashworth scale)  0  0   MSRs:  Right        Left                  brachioradialis 2+  2+  biceps 2+  2+  triceps 2+  2+  patellar 2+  2+  ankle jerk 0  0  Hoffman no  no  plantar response down  down   SENSORY:  Hyperesthesia to pin prick over the lower legs and feet bilaterally, vibration and temperature is reduced in the feet.  Sensation to all modalities intact in the hands.   COORDINATION/GAIT: Normal finger-to- nose-finger.  Intact rapid alternating movements bilaterally.  Unable to rise from a chair without using arms.  Gait wide-based, unsteady, assisted with a walker   IMPRESSION: Subacute onset of bilateral hand and feet paresthesias, most likely due to vitamin B12 deficiency.   - Check vitamin B12, vitamin B1, and copper to assess for other nutrient deficiency which can lead to neuropathy  - NCS/EMG of the left arm and leg  - Continue weekly B12 injections  - Fall precautions discussed  - Patient educated on daily foot inspection, fall prevention, and safety precautions around the home.  - Encouraged her to 3 eat nutritious balanced meals daily  Her blood pressure is elevated today, she attributes this being anxious about today's visit.  I have asked her to follow-up it home and if it remains elevated, follow-up with PCP.     Thank you for allowing me to participate in patient's care.  If I can answer any additional questions, I would be pleased to do so.    Sincerely,    Rozanne Heumann K. Posey Pronto, DO

## 2019-03-27 ENCOUNTER — Encounter: Payer: Self-pay | Admitting: Neurology

## 2019-03-27 ENCOUNTER — Ambulatory Visit (INDEPENDENT_AMBULATORY_CARE_PROVIDER_SITE_OTHER): Payer: Medicare Other | Admitting: Neurology

## 2019-03-27 ENCOUNTER — Other Ambulatory Visit: Payer: Self-pay

## 2019-03-27 ENCOUNTER — Other Ambulatory Visit (INDEPENDENT_AMBULATORY_CARE_PROVIDER_SITE_OTHER): Payer: Medicare Other

## 2019-03-27 VITALS — BP 184/76 | HR 78 | Ht 65.0 in | Wt 140.0 lb

## 2019-03-27 DIAGNOSIS — E538 Deficiency of other specified B group vitamins: Secondary | ICD-10-CM

## 2019-03-27 DIAGNOSIS — E639 Nutritional deficiency, unspecified: Secondary | ICD-10-CM

## 2019-03-27 DIAGNOSIS — I10 Essential (primary) hypertension: Secondary | ICD-10-CM | POA: Diagnosis not present

## 2019-03-27 LAB — VITAMIN B12: Vitamin B-12: 1073 pg/mL — ABNORMAL HIGH (ref 211–911)

## 2019-03-27 NOTE — Patient Instructions (Addendum)
Check labs Your provider has requested that you have labwork completed today. Please go to [facility] ([id number]) on the second floor of this building before leaving the office today. You do not need to check in. If you are not called within 15 minutes please check with the front desk.    Nerve testing of the left arm and leg.    Check feet daily  Use a shower chair  Always use your walker  ELECTROMYOGRAM AND NERVE CONDUCTION STUDIES (EMG/NCS) INSTRUCTIONS  How to Prepare The neurologist conducting the EMG will need to know if you have certain medical conditions. Tell the neurologist and other EMG lab personnel if you: . Have a pacemaker or any other electrical medical device . Take blood-thinning medications . Have hemophilia, a blood-clotting disorder that causes prolonged bleeding Bathing Take a shower or bath shortly before your exam in order to remove oils from your skin. Don't apply lotions or creams before the exam.  What to Expect You'll likely be asked to change into a hospital gown for the procedure and lie down on an examination table. The following explanations can help you understand what will happen during the exam.  . Electrodes. The neurologist or a technician places surface electrodes at various locations on your skin depending on where you're experiencing symptoms. Or the neurologist may insert needle electrodes at different sites depending on your symptoms.  . Sensations. The electrodes will at times transmit a tiny electrical current that you may feel as a twinge or spasm. The needle electrode may cause discomfort or pain that usually ends shortly after the needle is removed. If you are concerned about discomfort or pain, you may want to talk to the neurologist about taking a short break during the exam.  . Instructions. During the needle EMG, the neurologist will assess whether there is any spontaneous electrical activity when the muscle is at rest - activity  that isn't present in healthy muscle tissue - and the degree of activity when you slightly contract the muscle.  He or she will give you instructions on resting and contracting a muscle at appropriate times. Depending on what muscles and nerves the neurologist is examining, he or she may ask you to change positions during the exam.  After your EMG You may experience some temporary, minor bruising where the needle electrode was inserted into your muscle. This bruising should fade within several days. If it persists, contact your primary care doctor.

## 2019-03-30 ENCOUNTER — Telehealth: Payer: Self-pay

## 2019-03-30 LAB — VITAMIN B1: Vitamin B1 (Thiamine): 106 nmol/L — ABNORMAL HIGH (ref 8–30)

## 2019-03-30 LAB — COPPER, SERUM: Copper: 88 ug/dL (ref 70–175)

## 2019-03-30 NOTE — Telephone Encounter (Signed)
-----   Message from Alda Berthold, DO sent at 03/30/2019  9:28 AM EST ----- Please notify patient lab are within normal limits.  Thank you.

## 2019-03-30 NOTE — Telephone Encounter (Signed)
Informed patient of normal lab results.

## 2019-03-31 ENCOUNTER — Telehealth: Payer: Self-pay | Admitting: Neurology

## 2019-03-31 NOTE — Telephone Encounter (Signed)
Patient called regarding his mother and needing to get some information that was told to her. Her lmom that she does not hear well or understand things sometimes. Please Call. Thank you

## 2019-03-31 NOTE — Telephone Encounter (Signed)
Informed patients son of lab results

## 2019-04-07 ENCOUNTER — Other Ambulatory Visit: Payer: Self-pay

## 2019-04-07 ENCOUNTER — Ambulatory Visit (INDEPENDENT_AMBULATORY_CARE_PROVIDER_SITE_OTHER): Payer: Medicare Other | Admitting: Neurology

## 2019-04-07 DIAGNOSIS — E538 Deficiency of other specified B group vitamins: Secondary | ICD-10-CM | POA: Diagnosis not present

## 2019-04-07 DIAGNOSIS — G5622 Lesion of ulnar nerve, left upper limb: Secondary | ICD-10-CM

## 2019-04-07 DIAGNOSIS — E639 Nutritional deficiency, unspecified: Secondary | ICD-10-CM

## 2019-04-07 NOTE — Procedures (Signed)
[facility]  [address]  [place], [place] [address] Tel: [phone] Fax:  [phone] Test Date:  04/07/2019  Patient: Audrey Cowan DOB: 06/11/1954 Physician: Narda Amber, DO  Sex: Female Height: 5\' 6"  Ref Phys: Narda Amber, DO  ID#: [id number] Temp: 32.0C Technician:    Patient Complaints: This is a 78 year old female with history of vitamin B12 deficiency generalized numbness, weakness, and gait instability.  NCV & EMG Findings: Extensive electrodiagnostic testing of the left upper and lower extremity shows: 1. Left median and radial sensory responses show reduced amplitude (L8.5, L7.8 V).  Left ulnar, sural, and superficial peroneal sensory responses are absent. 2. Left ulnar motor response shows prolonged latency (3.6 ms), reduced amplitude (5.8 mV), and decreased conduction velocity (A Elbow-B Elbow, 42 m/s).  Left median motor responses within normal limits. 3. Left peroneal motor response is reduced at the extensor digitorum brevis, and normal at the tibialis anterior.  Left tibial motor responses are within normal limits. 4. Left tibial H reflex study is within normal limits. 5. Chronic motor axonal loss changes are seen in the ulnar innervated muscles, without accompanied active denervation.  Needle electrode examination in the left lower extremity is within normal limits.  Impression: 1. 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 2. 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 3. There is no evidence of a cervical/lumbosacral radiculopathy affecting the left side.   ___________________________ Narda Amber, DO    Nerve Conduction Studies Anti Sensory Summary Table   Site NR Peak (ms) Norm Peak (ms) P-T Amp (V) Norm P-T Amp  Left Median Anti Sensory (2nd Digit)  32C  Wrist    3.4 <3.8 8.5 >10  Left  Radial Anti Sensory (Base 1st Digit)  32C  Wrist    2.7 <2.8 7.8 >10  Left Sup Peroneal Anti Sensory (Ant Lat Mall)  32C  12 cm NR  <4.6  >3  Left Sural Anti Sensory (Lat Mall)  32C  Calf NR  <4.6  >3  Left Ulnar Anti Sensory (5th Digit)  32C  Wrist NR  <3.2  >5   Motor Summary Table   Site NR Onset (ms) Norm Onset (ms) O-P Amp (mV) Norm O-P Amp Site1 Site2 Delta-0 (ms) Dist (cm) Vel (m/s) Norm Vel (m/s)  Left Median Motor (Abd Poll Brev)  32C  Wrist    3.0 <4.0 6.5 >5 Elbow Wrist 4.7 27.0 57 >50  Elbow    7.7  6.0         Left Peroneal Motor (Ext Dig Brev)  32C  Ankle    4.8 <6.0 1.4 >2.5 B Fib Ankle 6.8 35.0 51 >40  B Fib    11.6  1.3  Poplt B Fib 1.6 8.0 50 >40  Poplt    13.2  1.3         Left Peroneal TA Motor (Tib Ant)  32C  Fib Head    3.6 <4.5 4.2 >3 Poplit Fib Head 1.2 8.0 67 >40  Poplit    4.8  3.8         Left Tibial Motor (Abd Hall Brev)  32C  Ankle    4.8 <6.0 6.3 >4 Knee Ankle 8.8 40.0 45 >40  Knee    13.6  3.5         Left Ulnar Motor (Abd Dig Minimi)  32C  Wrist    3.6 <  3.1 5.8 >7 B Elbow Wrist 3.4 22.0 65 >50  B Elbow    7.0  5.2  A Elbow B Elbow 2.4 10.0 42 >50  A Elbow    9.4  3.6          H Reflex Studies   NR H-Lat (ms) Lat Norm (ms) L-R H-Lat (ms)  Left Tibial (Gastroc)  32C     34.15 <35    EMG   Side Muscle Ins Act Fibs Psw Fasc Number Recrt Dur Dur. Amp Amp. Poly Poly. Comment  Left GluteusMed Nml Nml Nml Nml Nml Nml Nml Nml Nml Nml Nml Nml N/A  Left Abd Poll Brev Nml Nml Nml Nml Nml Nml Nml Nml Nml Nml Nml Nml N/A  Left PronatorTeres Nml Nml Nml Nml Nml Nml Nml Nml Nml Nml Nml Nml N/A  Left Biceps Nml Nml Nml Nml Nml Nml Nml Nml Nml Nml Nml Nml N/A  Left Triceps Nml Nml Nml Nml Nml Nml Nml Nml Nml Nml Nml Nml N/A  Left Deltoid Nml Nml Nml Nml Nml Nml Nml Nml Nml Nml Nml Nml N/A  Left Gastroc Nml Nml Nml Nml Nml Nml Nml Nml Nml Nml Nml Nml N/A  Left Flex Dig Long Nml Nml Nml Nml Nml Nml Nml Nml Nml Nml Nml Nml N/A  Left AntTibialis Nml  Nml Nml Nml Nml Nml Nml Nml Nml Nml Nml Nml N/A  Left RectFemoris Nml Nml Nml Nml Nml Nml Nml Nml Nml Nml Nml Nml N/A  Left 1stDorInt Nml Nml Nml Nml 1- Rapid Some 1+ Some 1+ Some 1+ N/A  Left FlexCarpiUln Nml Nml Nml Nml 1- Rapid Some 1+ Some 1+ Some 1+ N/A  Left ABD Dig Min Nml Nml Nml Nml 1- Rapid Some 1+ Some 1+ Nml Nml N/A      Waveforms:

## 2019-04-08 ENCOUNTER — Telehealth: Payer: Self-pay

## 2019-04-08 NOTE — Telephone Encounter (Signed)
-----   Message from Alda Berthold, DO sent at 04/08/2019  1:23 PM EST ----- Please inform patient that her nerve testing is consistent with neuropathy affecting the hands and feet, which is due to B12 deficiency.  Continue B12 injections.    She also has a pinched nerve at the left elbow, which may be causing some weakness and tingling in the hands.  Avoid over bending at the elbow and leaning on the elbow to minimize compression of the nerve.    She will need to schedule follow-up in 3 months.

## 2019-04-08 NOTE — Telephone Encounter (Signed)
Patient and son informed of results.  Multiple questions.  Follow up appointments scheduled.

## 2019-04-14 ENCOUNTER — Encounter: Payer: Medicare Other | Admitting: Neurology

## 2019-04-21 ENCOUNTER — Other Ambulatory Visit: Payer: Self-pay | Admitting: Student

## 2019-04-21 DIAGNOSIS — E538 Deficiency of other specified B group vitamins: Secondary | ICD-10-CM | POA: Diagnosis not present

## 2019-04-21 DIAGNOSIS — M25571 Pain in right ankle and joints of right foot: Secondary | ICD-10-CM

## 2019-04-23 ENCOUNTER — Telehealth: Payer: Medicare Other | Admitting: Neurology

## 2019-04-24 ENCOUNTER — Ambulatory Visit: Payer: Medicare Other | Admitting: Neurology

## 2019-05-01 ENCOUNTER — Ambulatory Visit
Admission: RE | Admit: 2019-05-01 | Discharge: 2019-05-01 | Disposition: A | Discharge status: Home / Self Care | Payer: Medicare Other | Source: Ambulatory Visit | Attending: Student | Admitting: Student

## 2019-05-01 ENCOUNTER — Other Ambulatory Visit: Payer: Self-pay

## 2019-05-01 DIAGNOSIS — S82851A Displaced trimalleolar fracture of right lower leg, initial encounter for closed fracture: Secondary | ICD-10-CM | POA: Diagnosis not present

## 2019-05-01 DIAGNOSIS — M25571 Pain in right ankle and joints of right foot: Secondary | ICD-10-CM

## 2019-05-01 IMAGING — CT CT ANKLE*R* W/O CM
3 series · 10 of 33 positions shown, 12 images · non-contrast
Comparison: None.

CLINICAL DATA: History of trimalleolar fracture of the right ankle
status post ORIF

EXAM:
CT OF THE RIGHT ANKLE WITHOUT CONTRAST
TECHNIQUE: Multidetector CT imaging of the right ankle was performed according
to the standard protocol. Multiplanar CT image reconstructions were
also generated.

[Series 5: sfov lower extremity 2.00 br40 s3 soft · axial · 0.19mm/px · z∈[+758,+850]mm · 2 of 102 slices shown, 3 images (1 of 3)]
[im 32/102  soft-tissue]
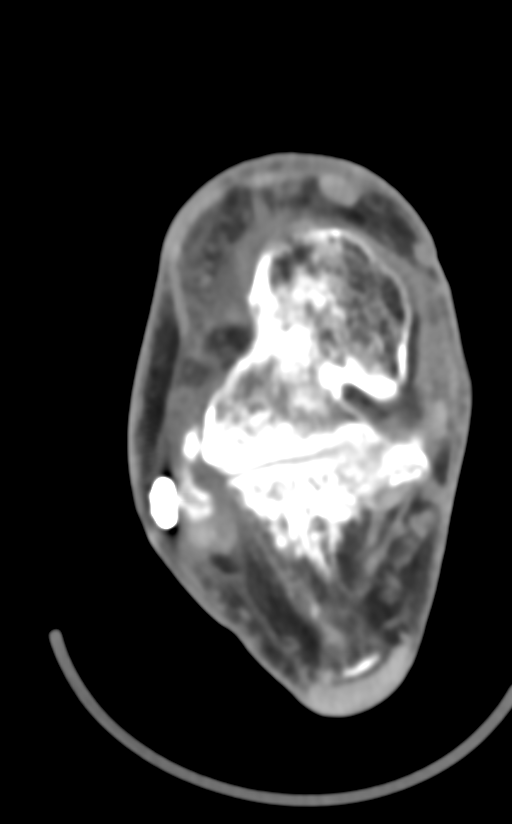
[im 32/102  bone]
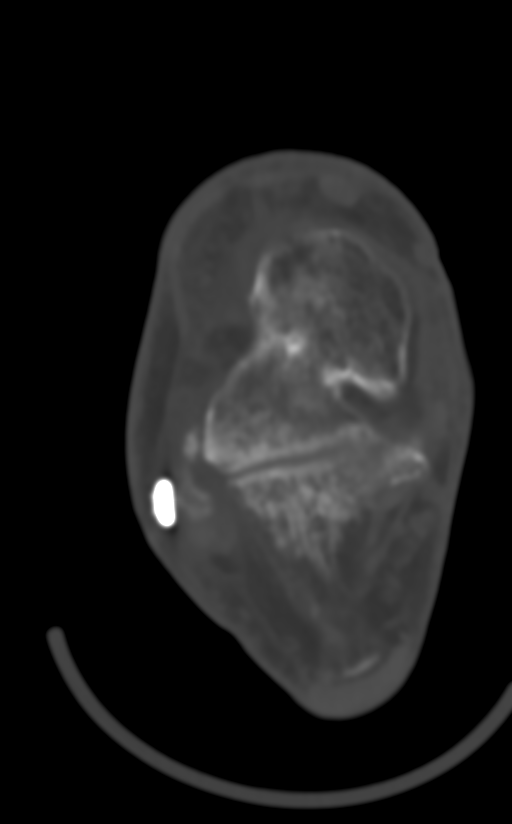
[im 78/102  bone]
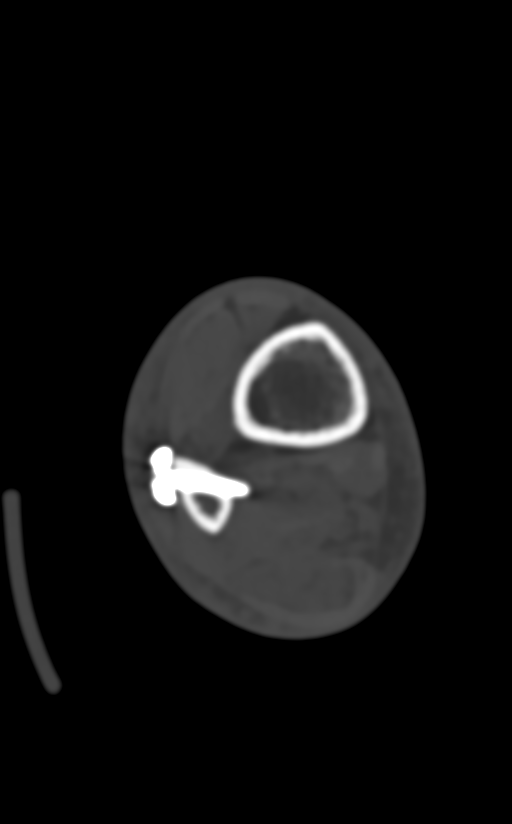

[Series 9: sfov lower extremity 2.00 br40 s3 soft · coronal · 0.18mm/px · 3 of 76 slices shown (2 of 3)]
[im 16/76  bone]
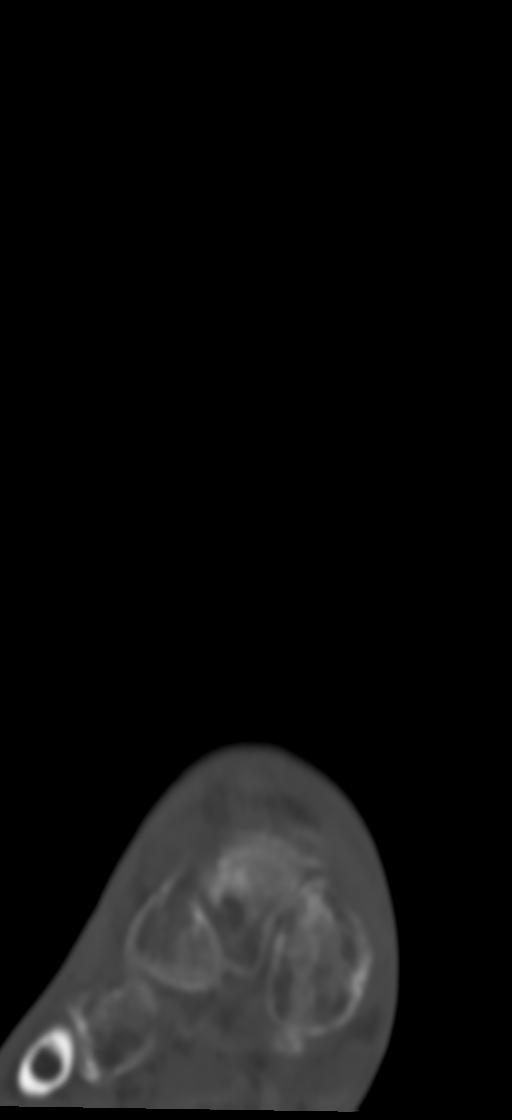
[im 31/76  bone]
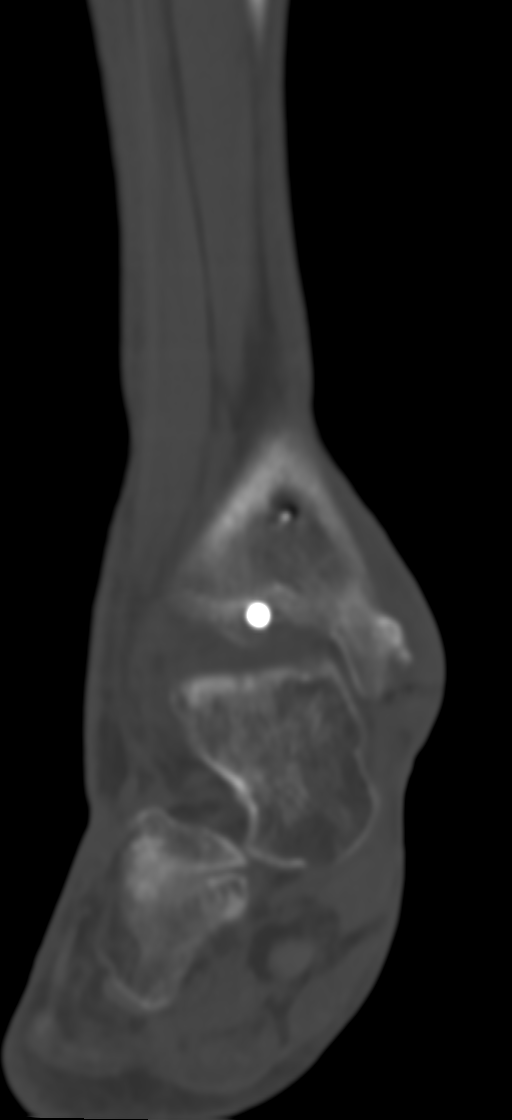
[im 46/76  bone]
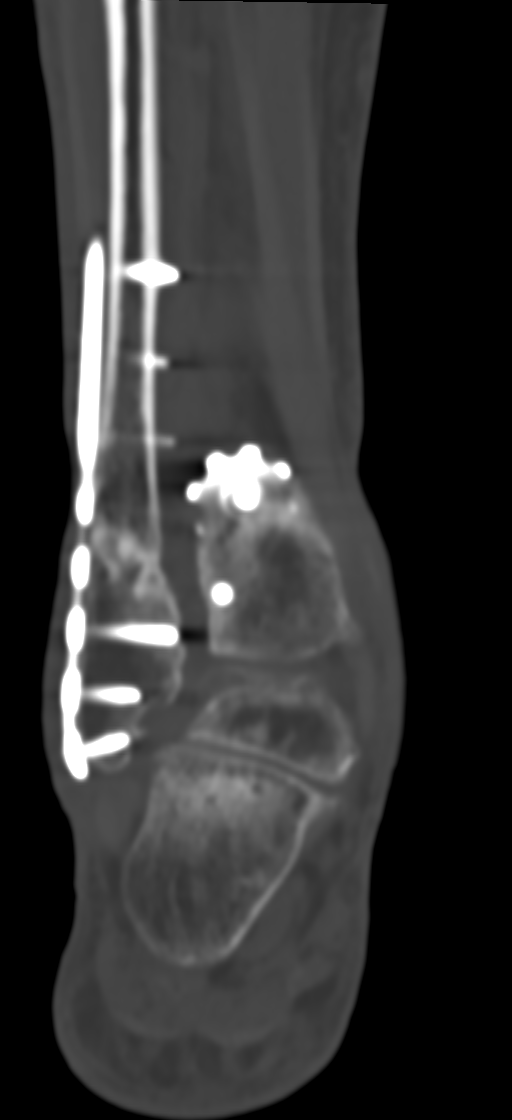

[Series 13: sfov lower extremity 2.00 br40 s3 soft · sagittal · 0.30mm/px · 5 of 54 slices shown, 6 images (3 of 3)]
[im 18/54  bone]
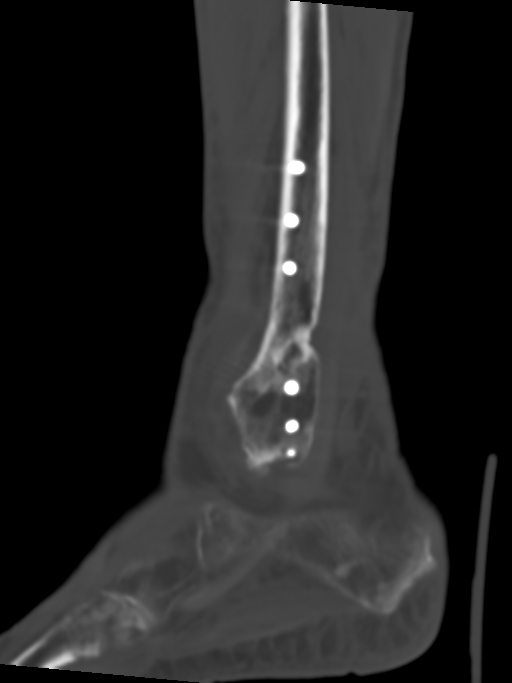
[im 23/54  bone]
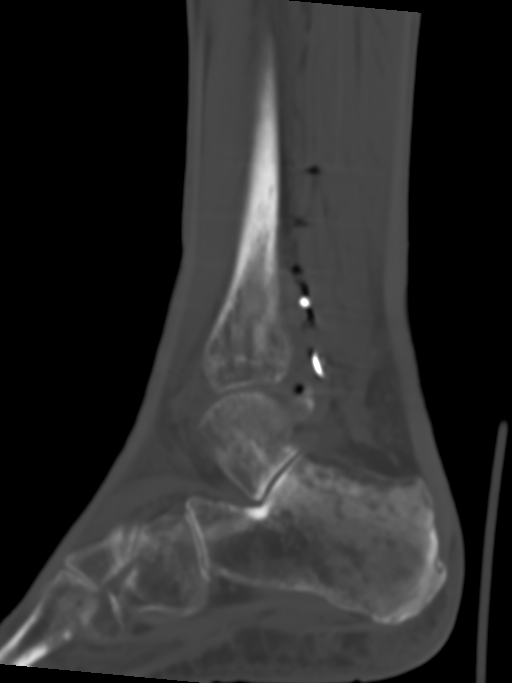
[im 27/54  soft-tissue]
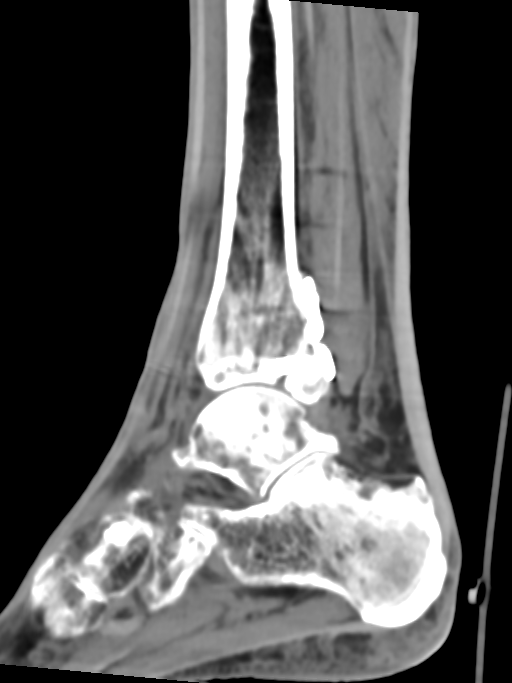
[im 27/54  bone]
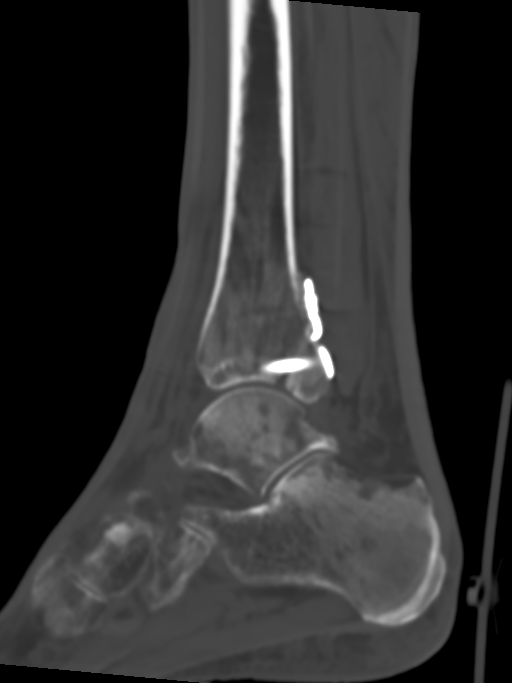
[im 31/54  bone]
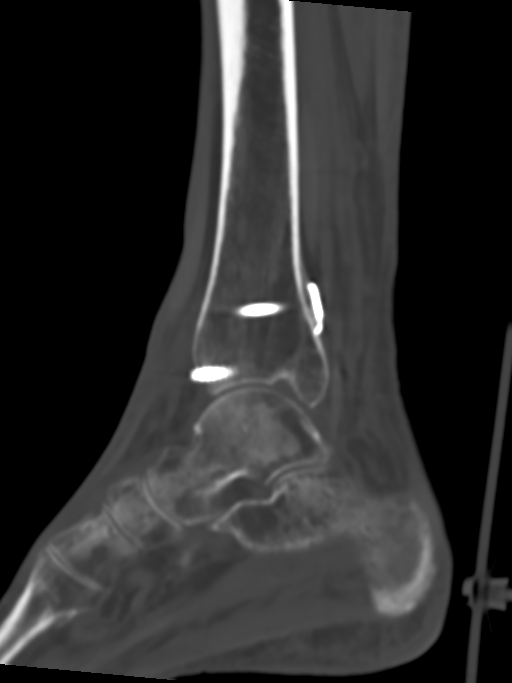
[im 36/54  bone]
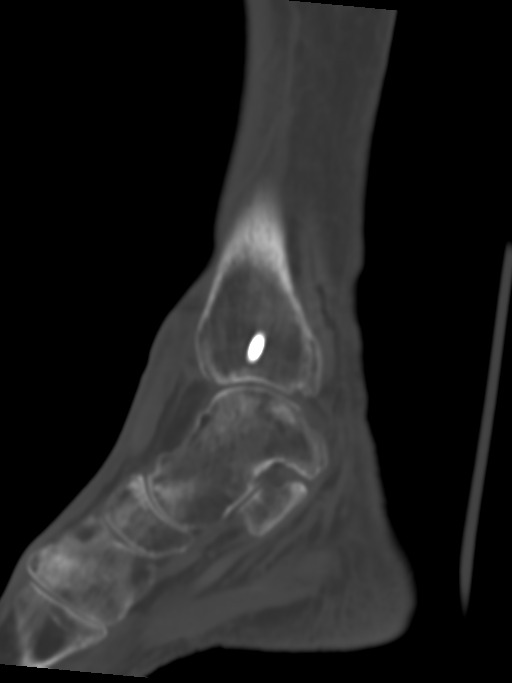

[10 of 33 positions shown; findings below may reference images not displayed]

FINDINGS: Bones/Joint/Cartilage

Prior distal fibular metaphyseal fracture status post ORIF with a
lateral sideplate and screw fixation construct. Hardware appears
intact without perihardware lucency or fracture. Portion of the
fracture remains evident with corticated margins (series 3, images
53-56). There is solid osseous union involving anterior aspect of
the distal fracture margin (series 11, image 18).

Healed fracture of the medial malleolus fixated by a single
partially threaded screw with no residual fracture line evident.
Hardware is intact without complication.

Posterior malleolar fracture fixated by 2 threaded screws with solid
osseous union involving more than 75% of the fracture margin. The
distal most aspect of the fracture remains ununited with corticated
margins (series 3, image 58) with up to 9 mm articular-surface
diastasis at the posterior aspect of the central tibiotalar joint
(series 11, image 33).

Tibiotalar joint alignment is anatomic without dislocation. Mild
diffuse joint space narrowing and mild spurring within the medial
and lateral gutters.

Alignment of the subtalar joints and midfoot are anatomic without
fracture or dislocation. Bones are diffusely heterogeneous
suggesting demineralization. No new or acute fractures are evident.

Ligaments

Suboptimally assessed by CT.

Muscles and Tendons

Posterior tibialis tendon is not well seen. Remaining
musculotendinous structures appear grossly intact without evidence
of high-grade tear or tendon entrapment.

Soft tissues

Skin thickening overlying the medial and lateral malleoli, which may
be postsurgical. Mild diffuse soft tissue edema. No focal fluid
collections or hematoma.
IMPRESSION: 1. Trimalleolar right ankle fracture status post ORIF. Solid osseous
union of the medial malleolar fracture with partial osseous union of
the posterior and lateral malleolar fractures, as described above.
2. Posterior tibialis tendon is not well seen. Correlate with exam
and surgical record.

## 2019-05-14 DIAGNOSIS — R2689 Other abnormalities of gait and mobility: Secondary | ICD-10-CM | POA: Diagnosis not present

## 2019-05-14 DIAGNOSIS — S82851D Displaced trimalleolar fracture of right lower leg, subsequent encounter for closed fracture with routine healing: Secondary | ICD-10-CM | POA: Diagnosis not present

## 2019-05-14 DIAGNOSIS — M79671 Pain in right foot: Secondary | ICD-10-CM | POA: Diagnosis not present

## 2019-05-19 DIAGNOSIS — E538 Deficiency of other specified B group vitamins: Secondary | ICD-10-CM | POA: Diagnosis not present

## 2019-05-29 DIAGNOSIS — S82851D Displaced trimalleolar fracture of right lower leg, subsequent encounter for closed fracture with routine healing: Secondary | ICD-10-CM | POA: Diagnosis not present

## 2019-05-29 DIAGNOSIS — R262 Difficulty in walking, not elsewhere classified: Secondary | ICD-10-CM | POA: Diagnosis not present

## 2019-05-29 DIAGNOSIS — M6281 Muscle weakness (generalized): Secondary | ICD-10-CM | POA: Diagnosis not present

## 2019-05-29 DIAGNOSIS — M25571 Pain in right ankle and joints of right foot: Secondary | ICD-10-CM | POA: Diagnosis not present

## 2019-06-02 DIAGNOSIS — M25571 Pain in right ankle and joints of right foot: Secondary | ICD-10-CM | POA: Diagnosis not present

## 2019-06-02 DIAGNOSIS — S82851D Displaced trimalleolar fracture of right lower leg, subsequent encounter for closed fracture with routine healing: Secondary | ICD-10-CM | POA: Diagnosis not present

## 2019-06-02 DIAGNOSIS — M6281 Muscle weakness (generalized): Secondary | ICD-10-CM | POA: Diagnosis not present

## 2019-06-02 DIAGNOSIS — R262 Difficulty in walking, not elsewhere classified: Secondary | ICD-10-CM | POA: Diagnosis not present

## 2019-06-05 DIAGNOSIS — S82851D Displaced trimalleolar fracture of right lower leg, subsequent encounter for closed fracture with routine healing: Secondary | ICD-10-CM | POA: Diagnosis not present

## 2019-06-05 DIAGNOSIS — R262 Difficulty in walking, not elsewhere classified: Secondary | ICD-10-CM | POA: Diagnosis not present

## 2019-06-05 DIAGNOSIS — M25571 Pain in right ankle and joints of right foot: Secondary | ICD-10-CM | POA: Diagnosis not present

## 2019-06-05 DIAGNOSIS — M6281 Muscle weakness (generalized): Secondary | ICD-10-CM | POA: Diagnosis not present

## 2019-06-09 DIAGNOSIS — R262 Difficulty in walking, not elsewhere classified: Secondary | ICD-10-CM | POA: Diagnosis not present

## 2019-06-09 DIAGNOSIS — S82851D Displaced trimalleolar fracture of right lower leg, subsequent encounter for closed fracture with routine healing: Secondary | ICD-10-CM | POA: Diagnosis not present

## 2019-06-09 DIAGNOSIS — M25571 Pain in right ankle and joints of right foot: Secondary | ICD-10-CM | POA: Diagnosis not present

## 2019-06-09 DIAGNOSIS — M6281 Muscle weakness (generalized): Secondary | ICD-10-CM | POA: Diagnosis not present

## 2019-06-11 DIAGNOSIS — S82851D Displaced trimalleolar fracture of right lower leg, subsequent encounter for closed fracture with routine healing: Secondary | ICD-10-CM | POA: Diagnosis not present

## 2019-06-11 DIAGNOSIS — M25571 Pain in right ankle and joints of right foot: Secondary | ICD-10-CM | POA: Diagnosis not present

## 2019-06-11 DIAGNOSIS — M6281 Muscle weakness (generalized): Secondary | ICD-10-CM | POA: Diagnosis not present

## 2019-06-11 DIAGNOSIS — R262 Difficulty in walking, not elsewhere classified: Secondary | ICD-10-CM | POA: Diagnosis not present

## 2019-06-16 ENCOUNTER — Other Ambulatory Visit: Payer: Self-pay | Admitting: Family Medicine

## 2019-06-16 DIAGNOSIS — R262 Difficulty in walking, not elsewhere classified: Secondary | ICD-10-CM | POA: Diagnosis not present

## 2019-06-16 DIAGNOSIS — R29898 Other symptoms and signs involving the musculoskeletal system: Secondary | ICD-10-CM | POA: Diagnosis not present

## 2019-06-16 DIAGNOSIS — S82851D Displaced trimalleolar fracture of right lower leg, subsequent encounter for closed fracture with routine healing: Secondary | ICD-10-CM | POA: Diagnosis not present

## 2019-06-16 DIAGNOSIS — G629 Polyneuropathy, unspecified: Secondary | ICD-10-CM | POA: Diagnosis not present

## 2019-06-16 DIAGNOSIS — R200 Anesthesia of skin: Secondary | ICD-10-CM

## 2019-06-16 DIAGNOSIS — E538 Deficiency of other specified B group vitamins: Secondary | ICD-10-CM | POA: Diagnosis not present

## 2019-06-16 DIAGNOSIS — I10 Essential (primary) hypertension: Secondary | ICD-10-CM | POA: Diagnosis not present

## 2019-06-16 DIAGNOSIS — M6281 Muscle weakness (generalized): Secondary | ICD-10-CM | POA: Diagnosis not present

## 2019-06-16 DIAGNOSIS — E039 Hypothyroidism, unspecified: Secondary | ICD-10-CM | POA: Diagnosis not present

## 2019-06-16 DIAGNOSIS — M25571 Pain in right ankle and joints of right foot: Secondary | ICD-10-CM | POA: Diagnosis not present

## 2019-06-16 DIAGNOSIS — N1831 Chronic kidney disease, stage 3a: Secondary | ICD-10-CM | POA: Diagnosis not present

## 2019-06-18 DIAGNOSIS — M6281 Muscle weakness (generalized): Secondary | ICD-10-CM | POA: Diagnosis not present

## 2019-06-18 DIAGNOSIS — R262 Difficulty in walking, not elsewhere classified: Secondary | ICD-10-CM | POA: Diagnosis not present

## 2019-06-18 DIAGNOSIS — S82851D Displaced trimalleolar fracture of right lower leg, subsequent encounter for closed fracture with routine healing: Secondary | ICD-10-CM | POA: Diagnosis not present

## 2019-06-18 DIAGNOSIS — M25571 Pain in right ankle and joints of right foot: Secondary | ICD-10-CM | POA: Diagnosis not present

## 2019-06-22 ENCOUNTER — Inpatient Hospital Stay (HOSPITAL_COMMUNITY)
Admission: EM | Admit: 2019-06-22 | Discharge: 2019-07-04 | DRG: 057 | Disposition: A | Discharge status: Home Health | Payer: Medicare Other | Attending: Internal Medicine | Admitting: Internal Medicine

## 2019-06-22 ENCOUNTER — Emergency Department (HOSPITAL_COMMUNITY): Payer: Medicare Other

## 2019-06-22 ENCOUNTER — Other Ambulatory Visit: Payer: Self-pay

## 2019-06-22 ENCOUNTER — Ambulatory Visit
Admission: RE | Admit: 2019-06-22 | Discharge: 2019-06-22 | Disposition: A | Discharge status: Home / Self Care | Payer: Medicare Other | Source: Ambulatory Visit | Attending: Family Medicine | Admitting: Family Medicine

## 2019-06-22 DIAGNOSIS — G7080 Lambert-Eaton syndrome, unspecified: Secondary | ICD-10-CM | POA: Diagnosis present

## 2019-06-22 DIAGNOSIS — N281 Cyst of kidney, acquired: Secondary | ICD-10-CM | POA: Diagnosis not present

## 2019-06-22 DIAGNOSIS — R29810 Facial weakness: Secondary | ICD-10-CM | POA: Diagnosis present

## 2019-06-22 DIAGNOSIS — N183 Chronic kidney disease, stage 3 unspecified: Secondary | ICD-10-CM | POA: Diagnosis not present

## 2019-06-22 DIAGNOSIS — Z79890 Hormone replacement therapy (postmenopausal): Secondary | ICD-10-CM

## 2019-06-22 DIAGNOSIS — R531 Weakness: Secondary | ICD-10-CM | POA: Diagnosis not present

## 2019-06-22 DIAGNOSIS — R000 Tachycardia, unspecified: Secondary | ICD-10-CM | POA: Diagnosis not present

## 2019-06-22 DIAGNOSIS — G459 Transient cerebral ischemic attack, unspecified: Secondary | ICD-10-CM | POA: Diagnosis not present

## 2019-06-22 DIAGNOSIS — E876 Hypokalemia: Secondary | ICD-10-CM | POA: Diagnosis present

## 2019-06-22 DIAGNOSIS — G7000 Myasthenia gravis without (acute) exacerbation: Secondary | ICD-10-CM | POA: Diagnosis not present

## 2019-06-22 DIAGNOSIS — Z7982 Long term (current) use of aspirin: Secondary | ICD-10-CM

## 2019-06-22 DIAGNOSIS — R1310 Dysphagia, unspecified: Secondary | ICD-10-CM | POA: Diagnosis not present

## 2019-06-22 DIAGNOSIS — E7800 Pure hypercholesterolemia, unspecified: Secondary | ICD-10-CM | POA: Diagnosis present

## 2019-06-22 DIAGNOSIS — R0602 Shortness of breath: Secondary | ICD-10-CM | POA: Diagnosis not present

## 2019-06-22 DIAGNOSIS — N1831 Chronic kidney disease, stage 3a: Secondary | ICD-10-CM | POA: Diagnosis present

## 2019-06-22 DIAGNOSIS — E785 Hyperlipidemia, unspecified: Secondary | ICD-10-CM | POA: Diagnosis present

## 2019-06-22 DIAGNOSIS — Z806 Family history of leukemia: Secondary | ICD-10-CM | POA: Diagnosis not present

## 2019-06-22 DIAGNOSIS — E872 Acidosis: Secondary | ICD-10-CM | POA: Diagnosis not present

## 2019-06-22 DIAGNOSIS — I129 Hypertensive chronic kidney disease with stage 1 through stage 4 chronic kidney disease, or unspecified chronic kidney disease: Secondary | ICD-10-CM | POA: Diagnosis present

## 2019-06-22 DIAGNOSIS — T426X5A Adverse effect of other antiepileptic and sedative-hypnotic drugs, initial encounter: Secondary | ICD-10-CM | POA: Diagnosis present

## 2019-06-22 DIAGNOSIS — R471 Dysarthria and anarthria: Secondary | ICD-10-CM

## 2019-06-22 DIAGNOSIS — I10 Essential (primary) hypertension: Secondary | ICD-10-CM | POA: Diagnosis present

## 2019-06-22 DIAGNOSIS — R200 Anesthesia of skin: Secondary | ICD-10-CM

## 2019-06-22 DIAGNOSIS — Z20822 Contact with and (suspected) exposure to covid-19: Secondary | ICD-10-CM | POA: Diagnosis not present

## 2019-06-22 DIAGNOSIS — M48061 Spinal stenosis, lumbar region without neurogenic claudication: Secondary | ICD-10-CM | POA: Diagnosis not present

## 2019-06-22 DIAGNOSIS — D72829 Elevated white blood cell count, unspecified: Secondary | ICD-10-CM | POA: Diagnosis not present

## 2019-06-22 DIAGNOSIS — R4781 Slurred speech: Secondary | ICD-10-CM | POA: Diagnosis not present

## 2019-06-22 DIAGNOSIS — E039 Hypothyroidism, unspecified: Secondary | ICD-10-CM | POA: Diagnosis not present

## 2019-06-22 DIAGNOSIS — T7840XA Allergy, unspecified, initial encounter: Secondary | ICD-10-CM | POA: Diagnosis not present

## 2019-06-22 DIAGNOSIS — R29898 Other symptoms and signs involving the musculoskeletal system: Secondary | ICD-10-CM

## 2019-06-22 DIAGNOSIS — R918 Other nonspecific abnormal finding of lung field: Secondary | ICD-10-CM | POA: Diagnosis not present

## 2019-06-22 LAB — I-STAT CHEM 8, ED
BUN: 18 mg/dL (ref 8–23)
Calcium, Ion: 1 mmol/L — ABNORMAL LOW (ref 1.15–1.40)
Chloride: 106 mmol/L (ref 98–111)
Creatinine, Ser: 0.5 mg/dL (ref 0.44–1.00)
Glucose, Bld: 85 mg/dL (ref 70–99)
HCT: 36 % (ref 36.0–46.0)
Hemoglobin: 12.2 g/dL (ref 12.0–15.0)
Potassium: 3.1 mmol/L — ABNORMAL LOW (ref 3.5–5.1)
Sodium: 140 mmol/L (ref 135–145)
TCO2: 21 mmol/L — ABNORMAL LOW (ref 22–32)

## 2019-06-22 LAB — COMPREHENSIVE METABOLIC PANEL
ALT: 18 U/L (ref 0–44)
AST: 31 U/L (ref 15–41)
Albumin: 4.3 g/dL (ref 3.5–5.0)
Alkaline Phosphatase: 76 U/L (ref 38–126)
Anion gap: 17 — ABNORMAL HIGH (ref 5–15)
BUN: 17 mg/dL (ref 8–23)
CO2: 19 mmol/L — ABNORMAL LOW (ref 22–32)
Calcium: 9.2 mg/dL (ref 8.9–10.3)
Chloride: 101 mmol/L (ref 98–111)
Creatinine, Ser: 0.91 mg/dL (ref 0.44–1.00)
GFR calc Af Amer: >60 mL/min (ref >60)
GFR calc non Af Amer: >60 mL/min (ref >60)
Glucose, Bld: 98 mg/dL (ref 70–99)
Potassium: 3.9 mmol/L (ref 3.5–5.1)
Sodium: 137 mmol/L (ref 135–145)
Total Bilirubin: 1.5 mg/dL — ABNORMAL HIGH (ref 0.3–1.2)
Total Protein: 7.3 g/dL (ref 6.5–8.1)

## 2019-06-22 LAB — ETHANOL: Alcohol, Ethyl (B): <10 mg/dL (ref <10)

## 2019-06-22 LAB — PROTIME-INR
INR: 1 (ref 0.8–1.2)
Prothrombin Time: 13.2 seconds (ref 11.4–15.2)

## 2019-06-22 LAB — DIFFERENTIAL
Abs Immature Granulocytes: 0.03 10*3/uL (ref 0.00–0.07)
Basophils Absolute: 0.1 10*3/uL (ref 0.0–0.1)
Basophils Relative: 1 %
Eosinophils Absolute: 0.1 10*3/uL (ref 0.0–0.5)
Eosinophils Relative: 1 %
Immature Granulocytes: 0 %
Lymphocytes Relative: 23 %
Lymphs Abs: 2.7 10*3/uL (ref 0.7–4.0)
Monocytes Absolute: 0.9 10*3/uL (ref 0.1–1.0)
Monocytes Relative: 8 %
Neutro Abs: 7.7 10*3/uL (ref 1.7–7.7)
Neutrophils Relative %: 67 %

## 2019-06-22 LAB — APTT: aPTT: 29 seconds (ref 24–36)

## 2019-06-22 LAB — CBC
HCT: 44.8 % (ref 36.0–46.0)
Hemoglobin: 15.2 g/dL — ABNORMAL HIGH (ref 12.0–15.0)
MCH: 29.9 pg (ref 26.0–34.0)
MCHC: 33.9 g/dL (ref 30.0–36.0)
MCV: 88 fL (ref 80.0–100.0)
Platelets: 380 10*3/uL (ref 150–400)
RBC: 5.09 MIL/uL (ref 3.87–5.11)
RDW: 13.1 % (ref 11.5–15.5)
WBC: 11.5 10*3/uL — ABNORMAL HIGH (ref 4.0–10.5)
nRBC: 0 % (ref 0.0–0.2)

## 2019-06-22 IMAGING — MR MR LUMBAR SPINE W/O CM
5 series · 40 of 48 positions shown · non-contrast
Comparison: None.

CLINICAL DATA: Bilateral leg weakness and numbness.

EXAM:
MRI LUMBAR SPINE WITHOUT CONTRAST
TECHNIQUE: Multiplanar, multisequence MR imaging of the lumbar spine was
performed. No intravenous contrast was administered.

[Series 3: T2 post-contrast · sagittal · 4.0mm · 0.88mm/px · 6 of 14 slices shown]
[im 1/14]
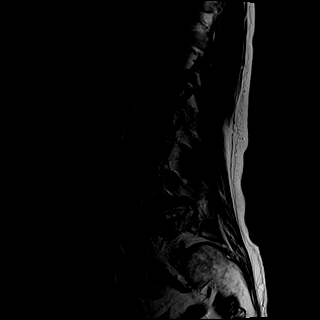
[im 3/14]
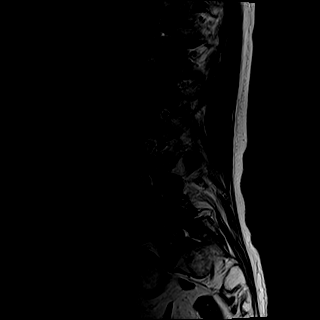
[im 6/14]
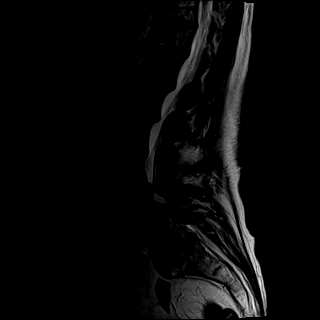
[im 8/14]
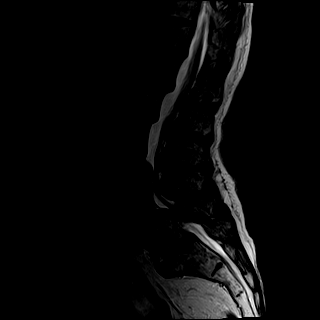
[im 11/14]
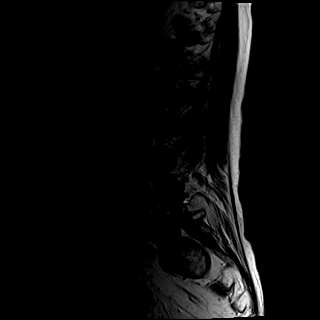
[im 14/14]
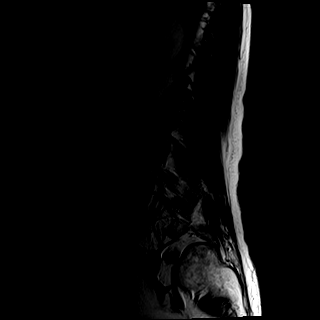

[Series 4: T1 · sagittal · 4.0mm · 0.88mm/px · 7 of 14 slices shown (1 of 2)]
[im 1/14]
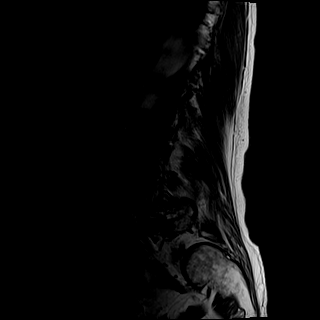
[im 3/14]
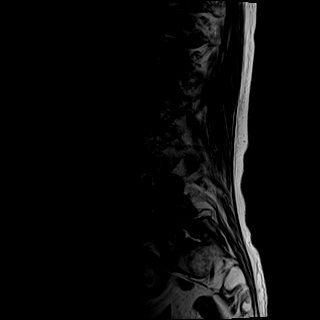
[im 5/14]
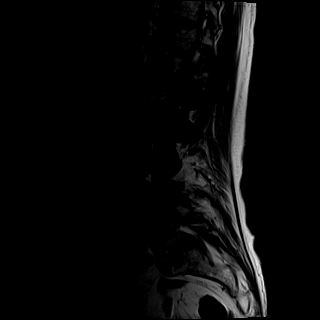
[im 7/14]
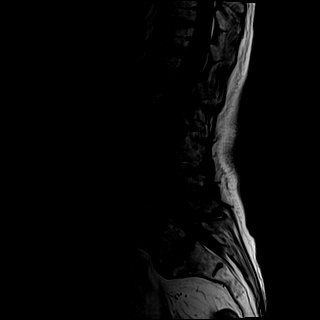
[im 9/14]
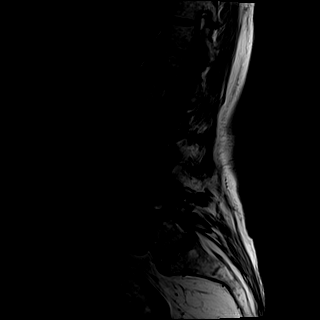
[im 11/14]
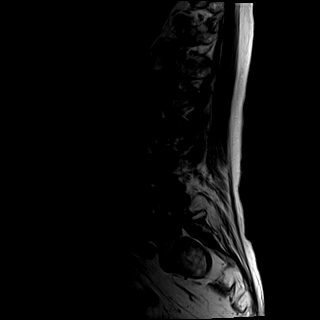
[im 14/14]
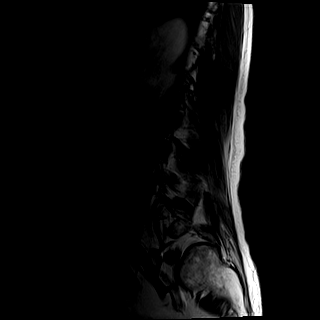

[Series 5: T1 · axial · 4.0mm · 0.78mm/px · z∈[-100,+122]mm · 8 of 30 slices shown (2 of 2)]
[im 1/30]
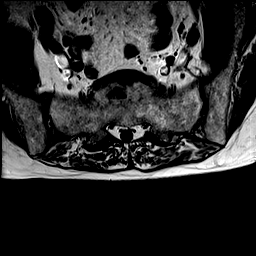
[im 5/30]
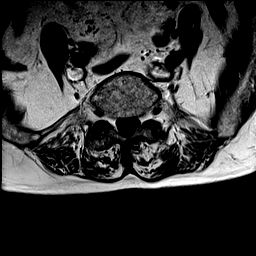
[im 9/30]
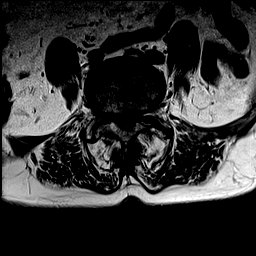
[im 14/30]
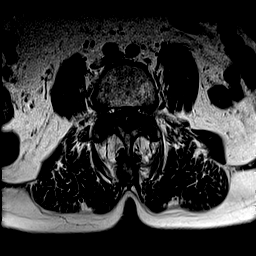
[im 16/30]
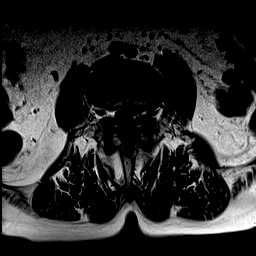
[im 21/30]
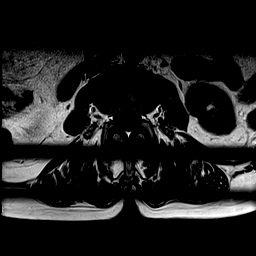
[im 25/30]
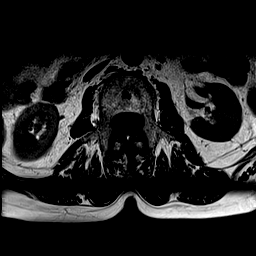
[im 30/30]
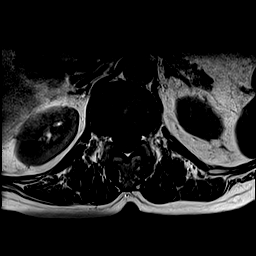

[Series 6: tirm sag · sagittal · 4.0mm · 0.55mm/px · 7 of 14 slices shown]
[im 1/14]
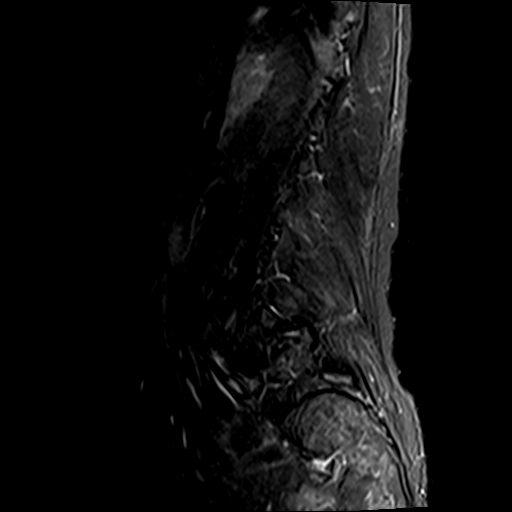
[im 3/14]
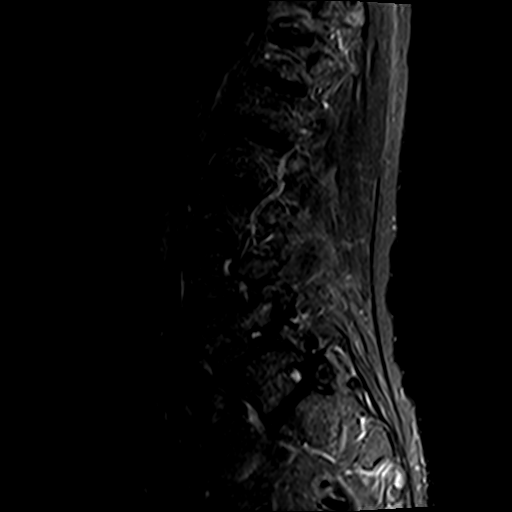
[im 5/14]
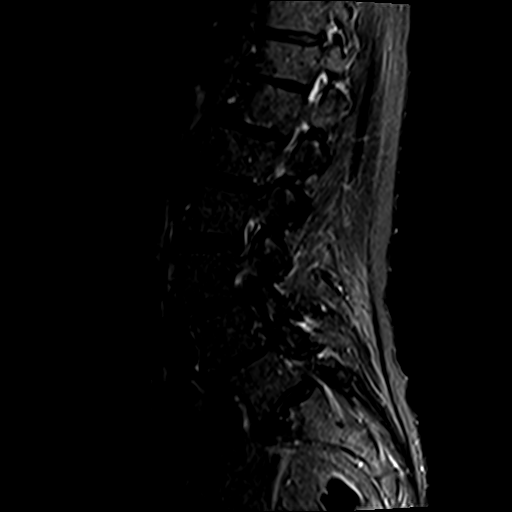
[im 7/14]
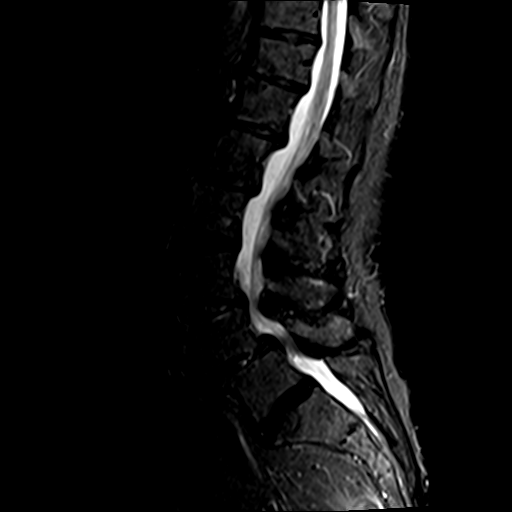
[im 9/14]
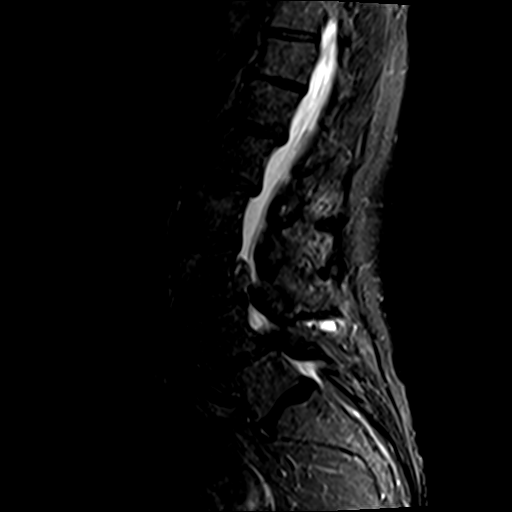
[im 11/14]
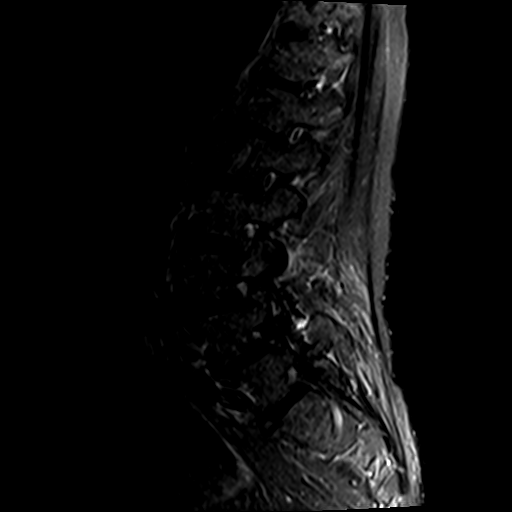
[im 14/14]
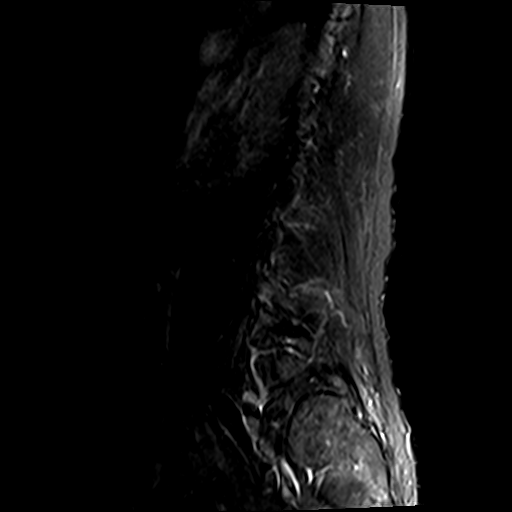

[Series 8: T2 · axial · 4.0mm · 0.78mm/px · z∈[-100,+122]mm · 12 of 30 slices shown]
[im 1/30]
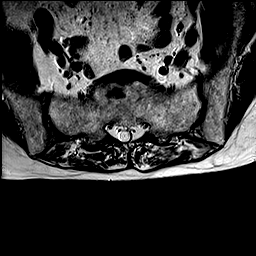
[im 3/30]
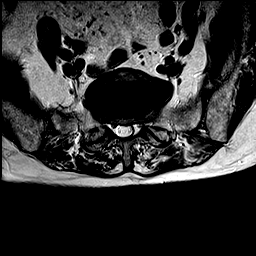
[im 5/30]
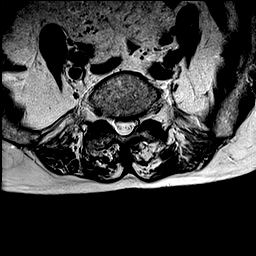
[im 7/30]
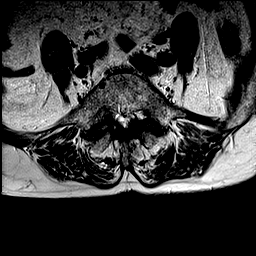
[im 9/30]
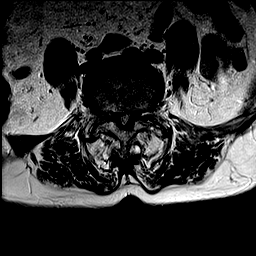
[im 12/30]
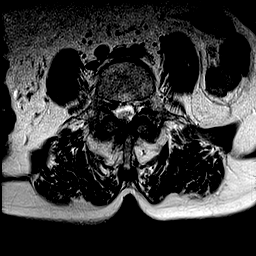
[im 14/30]
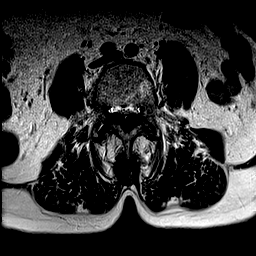
[im 16/30]
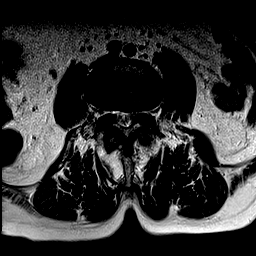
[im 18/30]
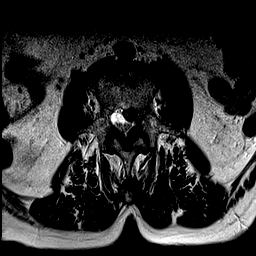
[im 21/30]
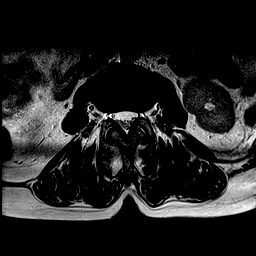
[im 25/30]
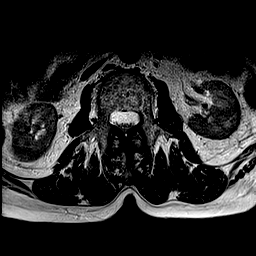
[im 30/30]
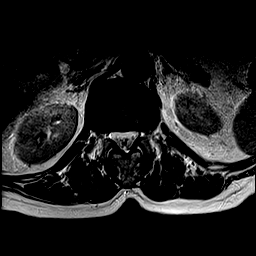

[40 of 48 positions shown; findings below may reference images not displayed]

FINDINGS: Segmentation:  Normal

Alignment: Mild retrolisthesis T11-12, T12-L1, L1-2. 8 mm
anterolisthesis L4-5.

Vertebrae:  Normal bone marrow.  Negative for fracture or mass.

Conus medullaris and cauda equina: Conus extends to the T12-L1
level. Conus and cauda equina appear normal.

Paraspinal and other soft tissues: Negative for paraspinous mass or
adenopathy.

Disc levels:

T12-L1: 4 mm retrolisthesis. Central small disc protrusion and mild
facet degeneration. Negative for stenosis

L1-2: 4 mm retrolisthesis. Mild disc and facet degeneration not
causing significant spinal or foraminal stenosis

L2-3: Mild disc and mild facet degeneration.  Negative for stenosis

L3-4: Severe facet degeneration bilaterally. Severe spinal stenosis.
Extruded disc fragment on the left with large disc fragment
extending superiorly behind the left L3 vertebral body. Left L3
nerve root impingement.

L4-5: Severe spinal stenosis. 8 mm anterolisthesis with severe facet
degeneration bilaterally. Right foraminal disc protrusion with
impingement of the right L4 nerve root due to disc protrusion and
foraminal encroachment.

L5-S1: Moderate facet degeneration. Moderate subarticular stenosis
bilaterally with mild flattening of the S1 nerve roots bilaterally.
IMPRESSION: Advanced multilevel degenerative change throughout the lumbar spine
as above.

Severe spinal stenosis L3-4 due to facet degeneration and disc
degeneration. In addition, there is a large extruded disc fragment
extending superiorly behind the L3 vertebral body on the left.

Severe spinal stenosis L4-5 with 8 mm anterolisthesis and severe
facet degeneration. Right foraminal encroachment with right L4 nerve
root compression.

## 2019-06-22 IMAGING — MR MR HEAD W/O CM
12 of 13 series · 44 of 48 positions shown · non-contrast
Comparison: None.

CLINICAL DATA: Transient ischemic attack

EXAM:
MRI HEAD WITHOUT CONTRAST
TECHNIQUE: Multiplanar, multiecho pulse sequences of the brain and surrounding
structures were obtained without intravenous contrast.

[Series 5: DWI · axial · 3.0mm · 0.92mm/px · z∈[-95,+58]mm · 7 of 104 slices shown (1 of 4)]
[im 1/104]
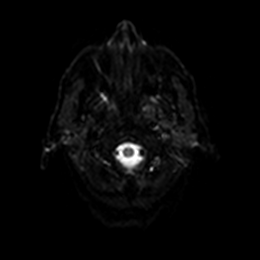
[im 18/104]
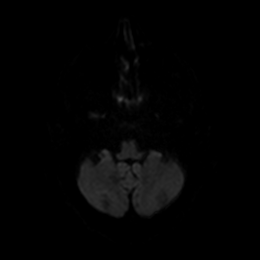
[im 35/104]
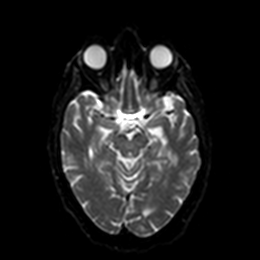
[im 52/104]
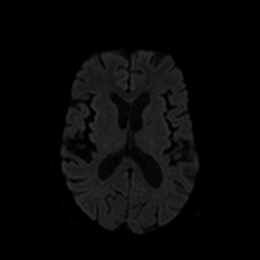
[im 69/104]
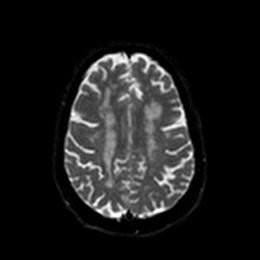
[im 86/104]
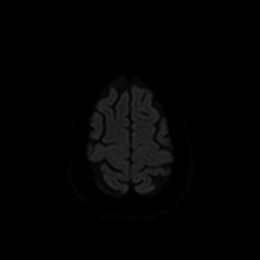
[im 104/104]
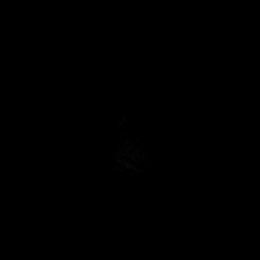

[Series 6: DWI · axial · 3.0mm · 0.92mm/px · z∈[-95,+58]mm · 4 of 52 slices shown (2 of 4)]
[im 1/52]
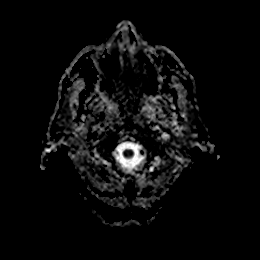
[im 18/52]
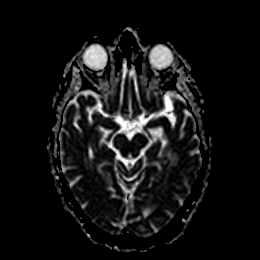
[im 35/52]
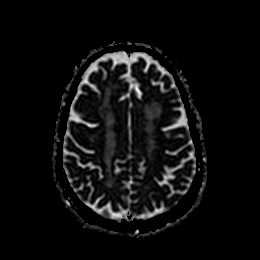
[im 52/52]
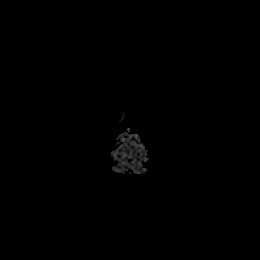

[Series 7: DWI · coronal · 4.0mm · 0.88mm/px · 6 of 74 slices shown (3 of 4)]
[im 1/74]
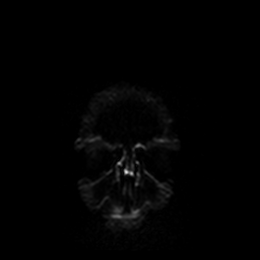
[im 15/74]
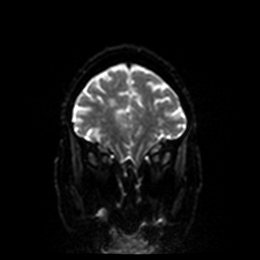
[im 30/74]
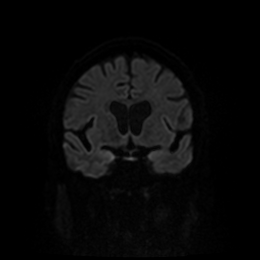
[im 44/74]
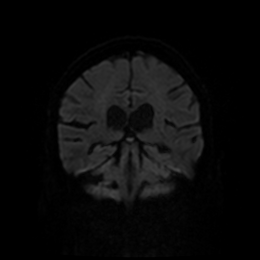
[im 59/74]
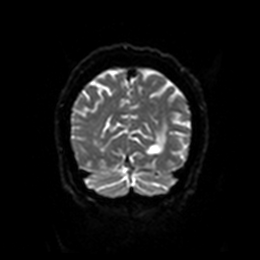
[im 74/74]
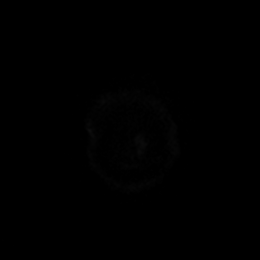

[Series 8: DWI · coronal · 4.0mm · 0.88mm/px · 3 of 37 slices shown (4 of 4)]
[im 1/37]
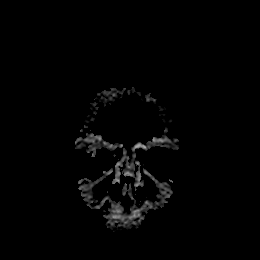
[im 19/37]
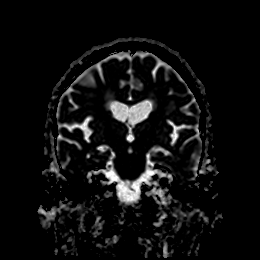
[im 37/37]
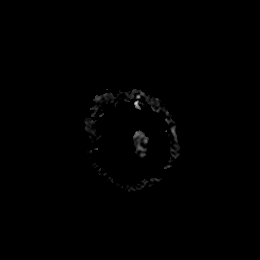

[Series 9: T1 · sagittal · 5.0mm · 0.75mm/px · 2 of 25 slices shown]
[im 1/25]
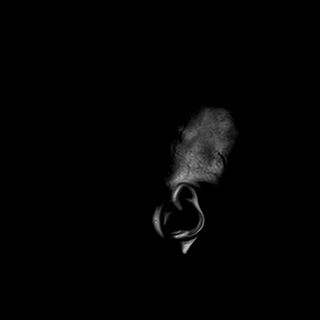
[im 25/25]
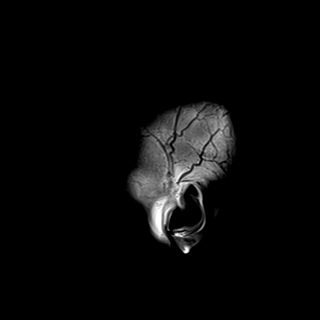

[Series 10: FLAIR · axial · 5.0mm · 0.45mm/px · z∈[-93,+51]mm · 2 of 25 slices shown]
[im 1/25]
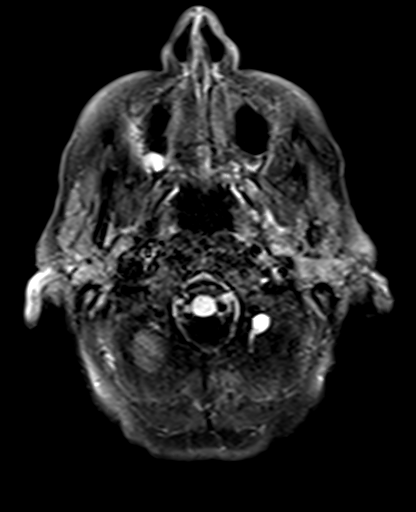
[im 25/25]
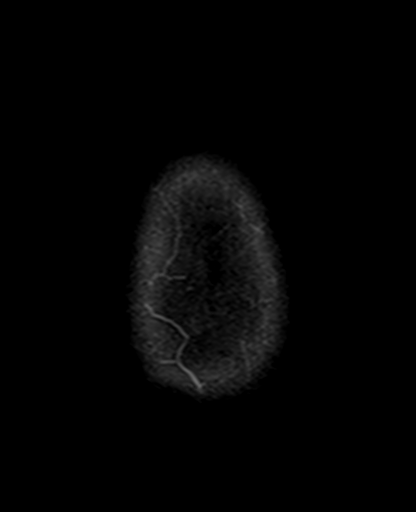

[Series 11: mag_images · axial · 3.0mm · 0.90mm/px · z∈[-104,+61]mm · 4 of 56 slices shown]
[im 1/56]
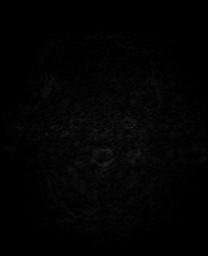
[im 19/56]
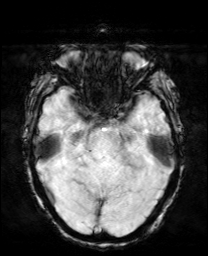
[im 37/56]
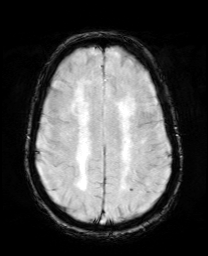
[im 56/56]
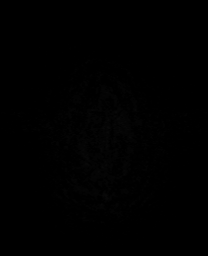

[Series 12: pha_images · axial · 3.0mm · 0.90mm/px · z∈[-95,+58]mm · 4 of 52 slices shown]
[im 1/52]
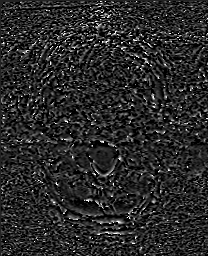
[im 18/52]
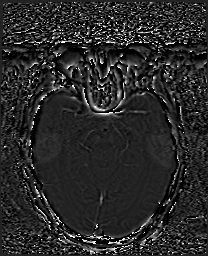
[im 35/52]
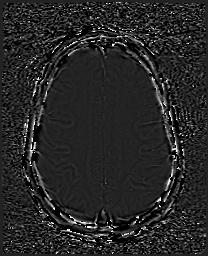
[im 52/52]
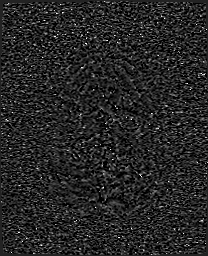

[Series 13: swi_images · axial · 3.0mm · 0.90mm/px · z∈[-104,+61]mm · 4 of 56 slices shown]
[im 1/56]
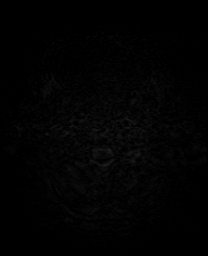
[im 19/56]
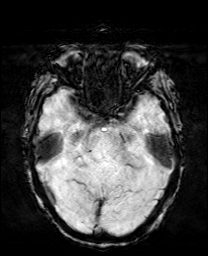
[im 37/56]
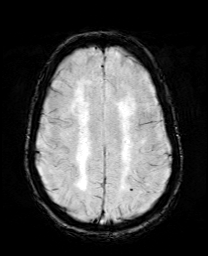
[im 56/56]
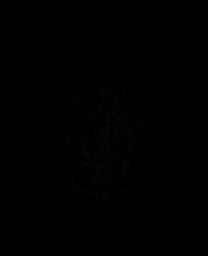

[Series 14: mip_images(sw) · axial · 24.0mm · 0.90mm/px · z∈[-93,+51]mm · 4 of 49 slices shown]
[im 1/49]
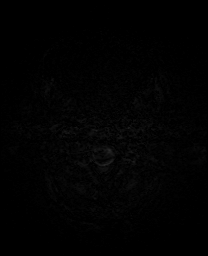
[im 17/49]
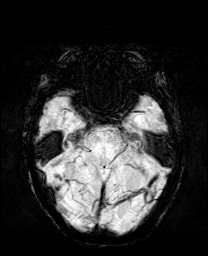
[im 33/49]
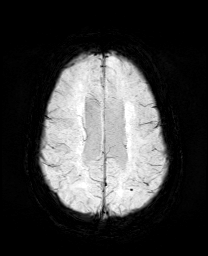
[im 49/49]
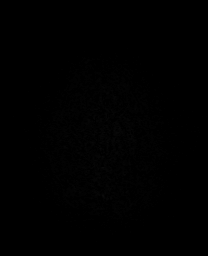

[Series 15: T2 · axial · 5.0mm · 0.75mm/px · z∈[-92,+51]mm · 2 of 25 slices shown (1 of 2)]
[im 1/25]
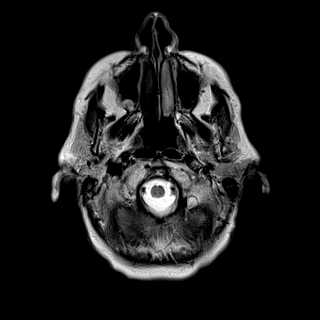
[im 25/25]
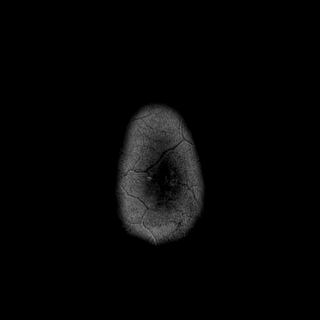

[Series 17: T2 · coronal · 5.0mm · 0.34mm/px · 2 of 31 slices shown (2 of 2)]
[im 1/31]
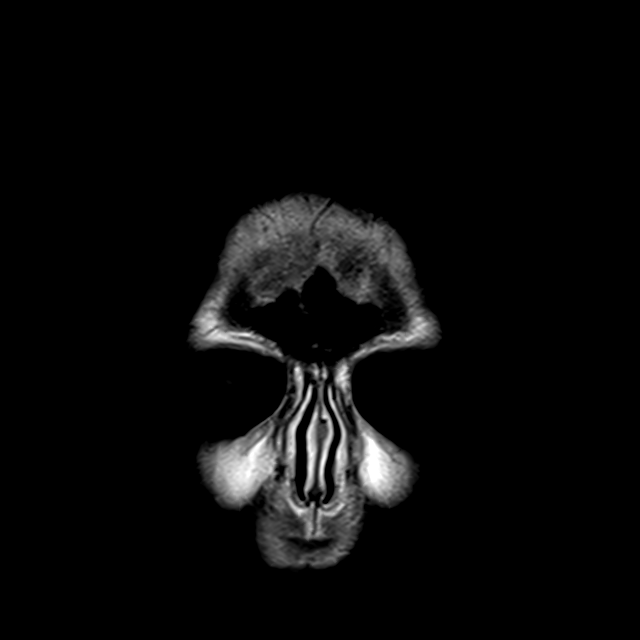
[im 31/31]
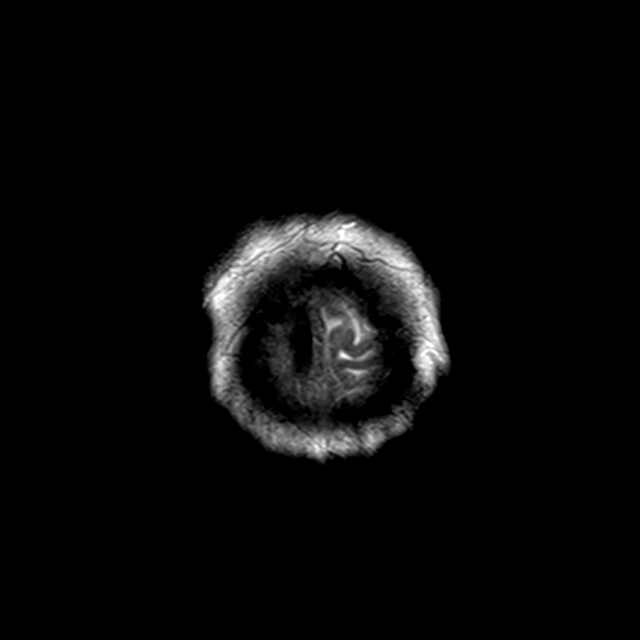

[44 of 48 positions shown; findings below may reference images not displayed]

FINDINGS: Brain: No acute infarct, acute hemorrhage or extra-axial collection.
Diffuse confluent hyperintense T2-weighted signal within the
periventricular, deep and juxtacortical white matter, most commonly
due to chronic ischemic microangiopathy. Normal volume of CSF
spaces. Single chronic microhemorrhage in the left parietal lobe.
Normal midline structures.

Vascular: Normal flow voids.

Skull and upper cervical spine: Normal marrow signal.

Sinuses/Orbits: Negative.

Other: None.
IMPRESSION: 1. No acute intracranial abnormality.
2. Severe white matter changes of chronic ischemic microangiopathy.

## 2019-06-22 IMAGING — CT CT HEAD W/O CM
4 series · 17 of 47 positions shown, 19 images · non-contrast
Comparison: None.

CLINICAL DATA: Slurred speech and trouble swallowing

EXAM:
CT HEAD WITHOUT CONTRAST
TECHNIQUE: Contiguous axial images were obtained from the base of the skull
through the vertex without intravenous contrast.

[Series 3: head without · axial · non-contrast · 0.44mm/px · z∈[-152,-12]mm · 7 of 38 slices shown, 9 images]
[im 5/38  brain]
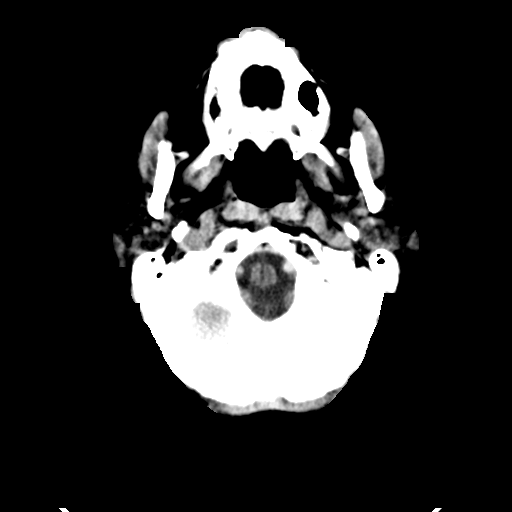
[im 5/38  bone]
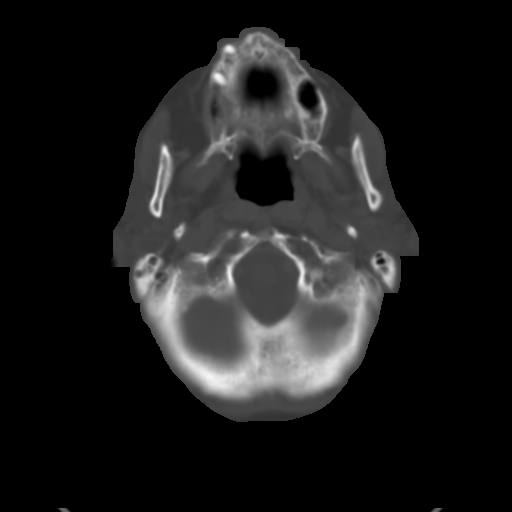
[im 10/38  brain]
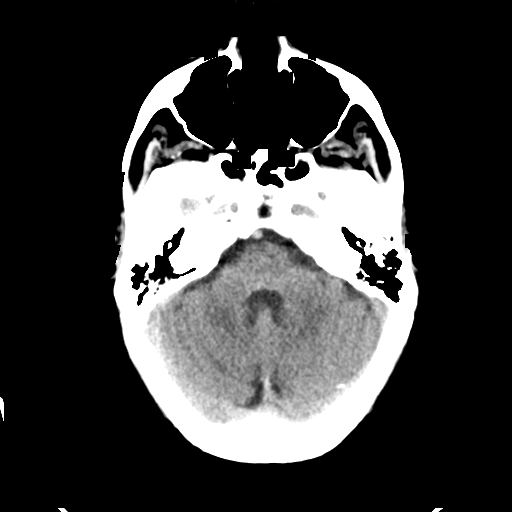
[im 14/38  brain]
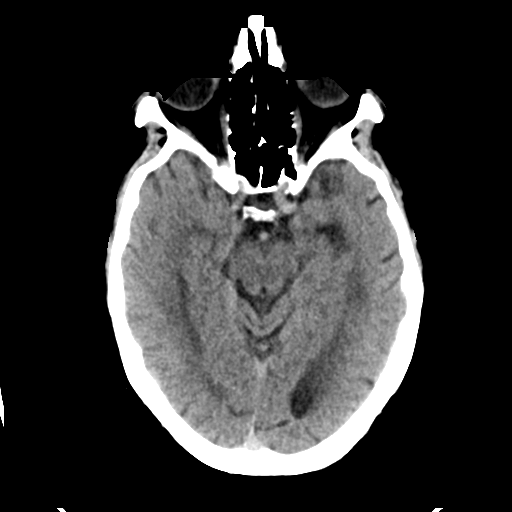
[im 19/38  brain]
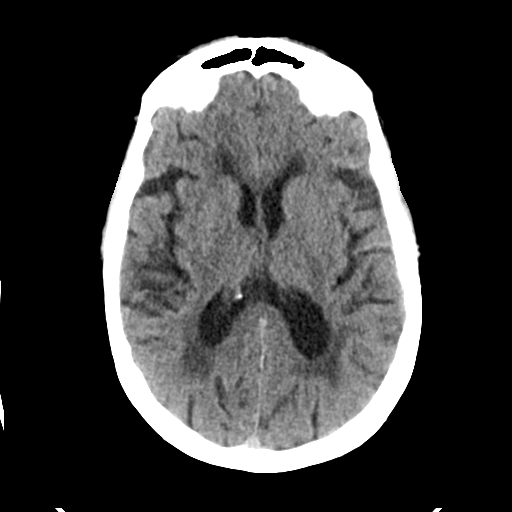
[im 24/38  brain]
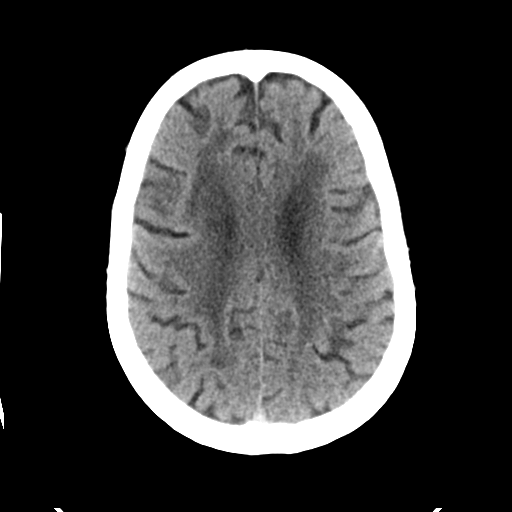
[im 24/38  bone]
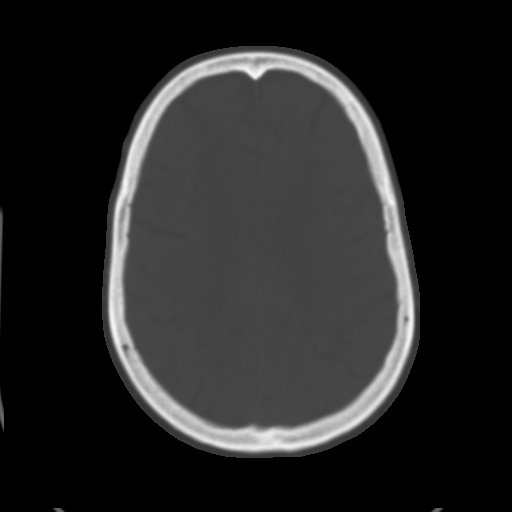
[im 28/38  brain]
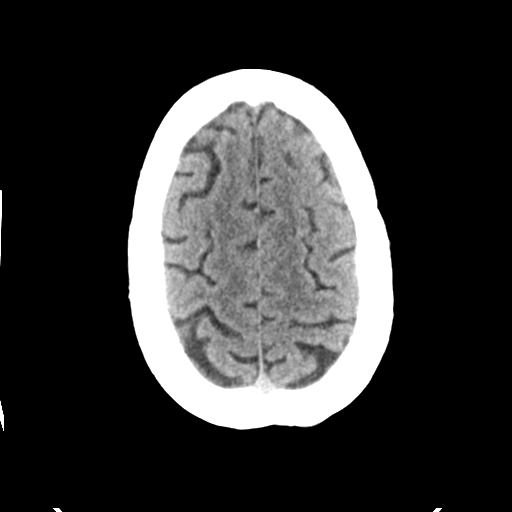
[im 33/38  brain]
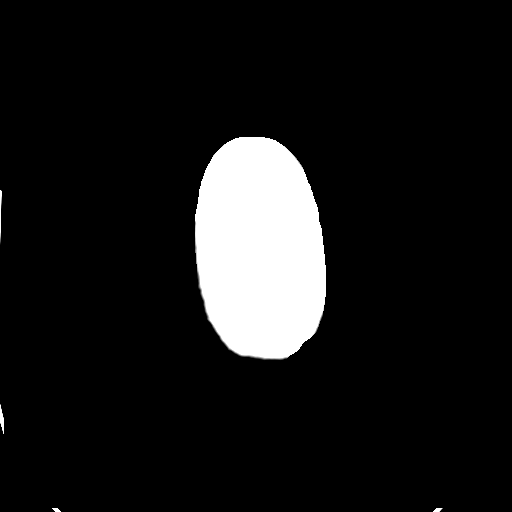

[Series 4: head bone · axial · 0.44mm/px · z∈[-154,-90]mm · 4 of 93 slices shown]
[im 10/93  bone]
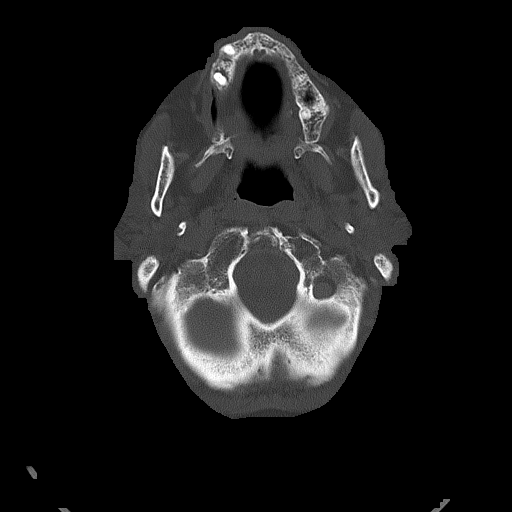
[im 19/93  bone]
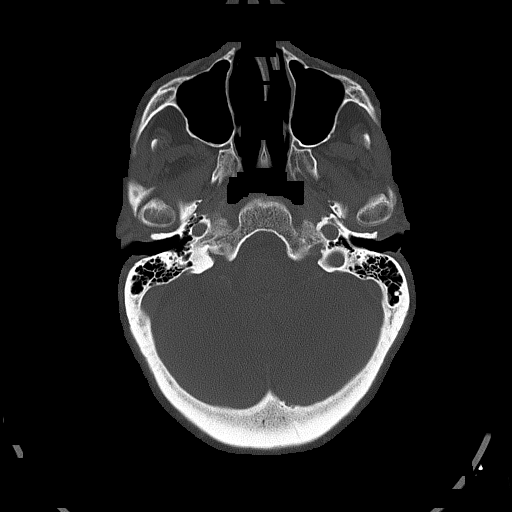
[im 28/93  bone]
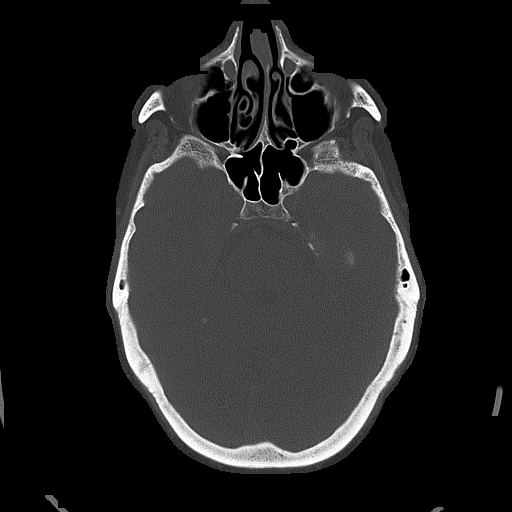
[im 42/93  bone]
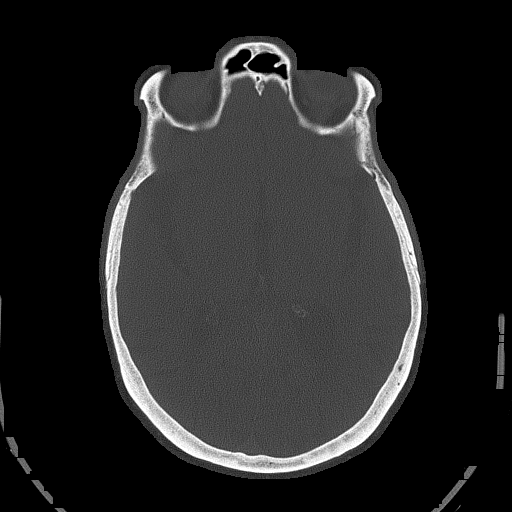

[Series 5: head without cor · coronal · non-contrast · 0.34mm/px · 3 of 74 slices shown]
[im 25/74  brain]
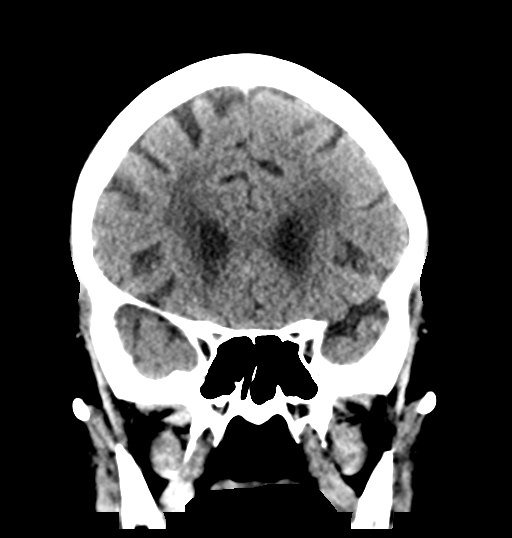
[im 33/74  brain]
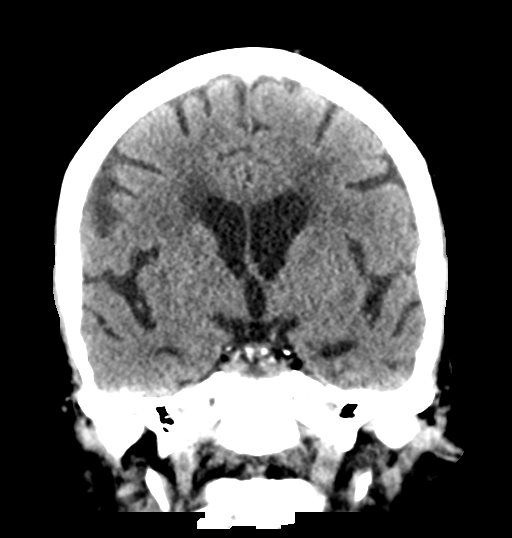
[im 41/74  brain]
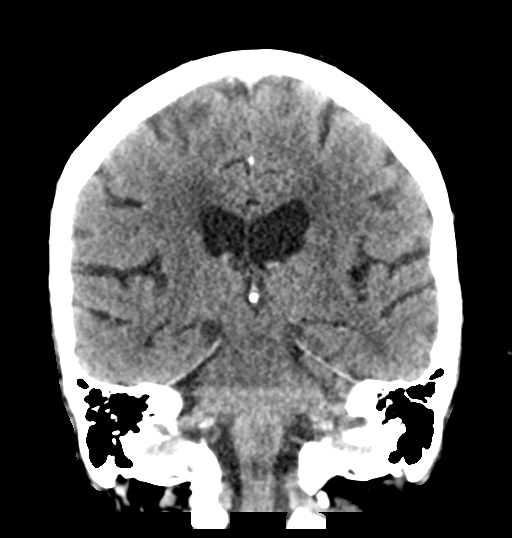

[Series 6: head without sag · sagittal · non-contrast · 0.36mm/px · 3 of 56 slices shown]
[im 19/56  brain]
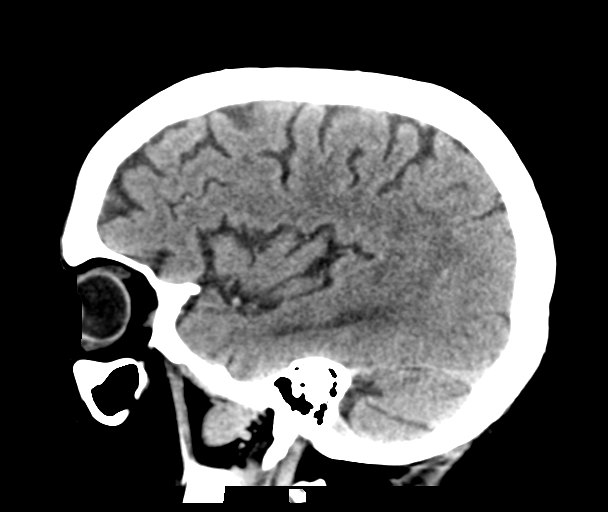
[im 28/56  brain]
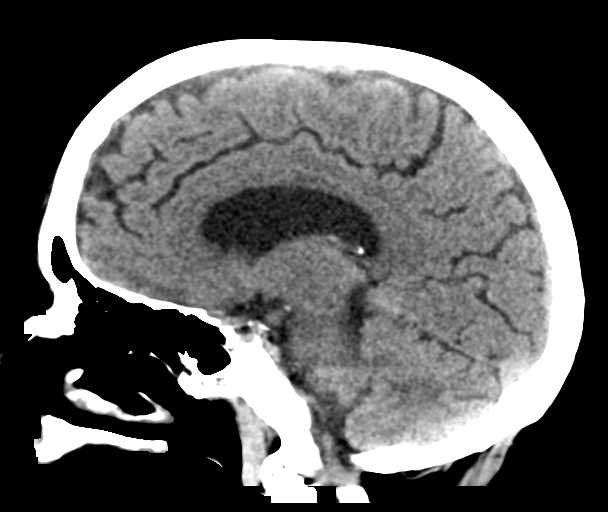
[im 37/56  brain]
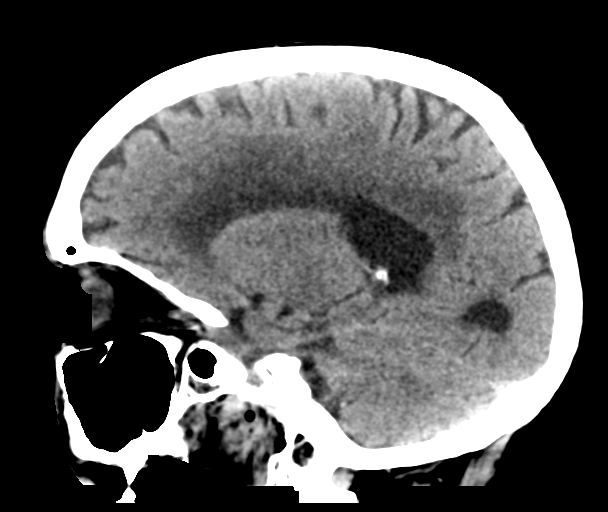

[17 of 47 positions shown; findings below may reference images not displayed]

FINDINGS: Brain: No acute territorial infarction, hemorrhage or intracranial
mass. Mild atrophy. Moderate hypodensity in the white matter
consistent with chronic small vessel ischemic change. Nonenlarged
ventricles.

Vascular: No hyperdense vessels.  Carotid vascular calcification.

Skull: Normal. Negative for fracture or focal lesion.

Sinuses/Orbits: No acute finding.

Other: None
IMPRESSION: 1. No CT evidence for acute intracranial abnormality.
2. Atrophy and chronic small vessel ischemic changes of the white
matter

## 2019-06-22 NOTE — H&P (Addendum)
History and Physical   Audrey Cowan [id number] DOB: 03-Jan-1941 DOA: 06/22/2019  Referring MD/NP/PA: Dr. Darl Householder  PCP: Carol Ada, MD   Patient coming from: Home  Chief Complaint: Difficulty swallowing and speech  HPI: Audrey Cowan is a 79 y.o. female with medical history significant of hypertension, hyperlipidemia, chronic kidney disease stage III, recent right ankle fracture who presents to the ER with difficulty speaking and swallowing for the last 3 days.  Patient reports right ankle fracture that happened in January of this year.  Since then she has felt progressively weak.  She was however still able to function until the last 3 days when she noted progressive change in her speech and swallowing.  Patient came to the ER where initially CVA was suspected.  Work-up done including MRI of the brain was negative.  Neurology consulted with suggestion for possible neuro vascular disease including myasthenia gravis.  Work-up will be initiated.  Patient reported to me more for generalized weakness.  Her swallowing problem is only today.  She appears to have generalized muscle weakness.  She thinks is related to her recovery from her fractures.  She had a fall 2 days ago with some skin scrapings.  She hit her face with some mild laceration of her nose.  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 She has been unsteady on her feet lately.  ED Course: Temperature is 97.6 blood pressure 162/92 pulse 106 respirate 27 oxygen sats 95% room air.  White count is 11.5 potassium 3.1 otherwise chemistry within normal.  0.11 COVID-19 so far negative head CT and MRI of the brain both negative.  Neurology to see patient and she is being admitted for further work-up.   Review of Systems: As per HPI otherwise 10 point review of systems negative.    Past Medical History:  Diagnosis Date   Chronic kidney disease    CKD   Hypercholesteremia    Hypertension    Hypothyroidism     Trimalleolar fracture    RIGHT ANKLE    Past Surgical History:  Procedure Laterality Date   ORIF ANKLE FRACTURE Right 10/09/2018   Procedure: OPEN REDUCTION INTERNAL FIXATION (ORIF) Right ankle trimalleolar fracture;  Surgeon: Wylene Simmer, MD;  Location: [place];  Service: Orthopedics;  Laterality: Right;  3min     reports that she has never smoked. She has never used smokeless tobacco. She reports previous alcohol use. She reports that she does not use drugs.  No Known Allergies  Family History  Problem Relation Age of Onset   Leukemia Sister      Prior to Admission medications   Medication Sig Start Date End Date Taking? Authorizing Provider  amLODipine (NORVASC) 5 MG tablet Take 5 mg by mouth daily. 06/02/19  Yes [provider]  aspirin EC 81 MG tablet Take 81 mg by mouth every other day.   Yes [provider]  atorvastatin (LIPITOR) 20 MG tablet Take 20 mg by mouth daily.   Yes [provider]  benazepril (LOTENSIN) 40 MG tablet Take 40 mg by mouth daily.   Yes [provider]  Calcium Carbonate-Vitamin D (CALTRATE 600+D PO) Take 1 tablet by mouth daily.    Yes [provider]  cholecalciferol (VITAMIN D3) 25 MCG (1000 UT) tablet Take 1,000 Units by mouth daily.   Yes [provider]  furosemide (LASIX) 20 MG tablet Take 10 mg by mouth every other day.    Yes  [provider]  gabapentin (NEURONTIN) 100 MG capsule Take 200 mg by mouth daily.  06/16/19  Yes [provider]  Grape Seed 100 MG CAPS Take 100 mg by mouth daily. MUSCADINE SEED FOR PSORIASIS    Yes [provider]  levothyroxine (SYNTHROID) 88 MCG tablet Take 88 mcg by mouth daily before breakfast.   Yes [provider]    Physical Exam: Vitals:   06/22/19 2330 06/23/19 0014 06/23/19 0015 06/23/19 0030  BP: (!) 162/92 (!) 137/96  (!) 147/91  Pulse:   (!) 106 (!) 102  Resp: (!) 24 14 (!) 25 (!) 22  Temp:       SpO2:   95% 96%  Weight:      Height:          Constitutional: Weak, debilitated, no acute distress Vitals:   06/22/19 2330 06/23/19 0014 06/23/19 0015 06/23/19 0030  BP: (!) 162/92 (!) 137/96  (!) 147/91  Pulse:   (!) 106 (!) 102  Resp: (!) 24 14 (!) 25 (!) 22  Temp:      SpO2:   95% 96%  Weight:      Height:       Eyes: PERRL, lids and conjunctivae normal ENMT: Mucous membranes are dry. Posterior pharynx clear of any exudate or lesions.Normal dentition.  Neck: normal, supple, no masses, no thyromegaly Respiratory: clear to auscultation bilaterally, no wheezing, no crackles. Normal respiratory effort. No accessory muscle use.  Cardiovascular: Sinus tachycardia, no murmurs / rubs / gallops. No extremity edema. 2+ pedal pulses. No carotid bruits.  Abdomen: no tenderness, no masses palpated. No hepatosplenomegaly. Bowel sounds positive.  Musculoskeletal: no clubbing / cyanosis. No joint deformity upper and lower extremities. Good ROM, no contractures. Normal muscle tone.  Skin: no rashes, lesions, ulcers. No induration Neurologic: CN 2-12 grossly intact. Sensation intact, DTR normal. Strength 5/5 in all 4.  Psychiatric: Normal judgment and insight. Alert and oriented x 3.  Difficulty hearing and slow    Labs on Admission: I have personally reviewed following labs and imaging studies  CBC: Recent Labs  Lab 06/22/19 2101 06/22/19 2110  WBC 11.5*  --   NEUTROABS 7.7  --   HGB 15.2* 12.2  HCT 44.8 36.0  MCV 88.0  --   PLT 380  --    Basic Metabolic Panel: Recent Labs  Lab 06/22/19 2101 06/22/19 2110  NA 137 140  K 3.9 3.1*  CL 101 106  CO2 19*  --   GLUCOSE 98 85  BUN 17 18  CREATININE 0.91 0.50  CALCIUM 9.2  --    GFR: Estimated Creatinine Clearance: 53 mL/min (by C-G formula based on SCr of 0.5 mg/dL). Liver Function Tests: Recent Labs  Lab 06/22/19 2101  AST 31  ALT 18  ALKPHOS 76  BILITOT 1.5*  PROT 7.3  ALBUMIN 4.3   No results for input(s):  LIPASE, AMYLASE in the last 168 hours. No results for input(s): AMMONIA in the last 168 hours. Coagulation Profile: Recent Labs  Lab 06/22/19 2101  INR 1.0   Cardiac Enzymes: No results for input(s): CKTOTAL, CKMB, CKMBINDEX, TROPONINI in the last 168 hours. BNP (last 3 results) No results for input(s): PROBNP in the last 8760 hours. HbA1C: No results for input(s): HGBA1C in the last 72 hours. CBG: No results for input(s): GLUCAP in the last 168 hours. Lipid Profile: No results for input(s): CHOL, HDL, LDLCALC, TRIG, CHOLHDL, LDLDIRECT in the last 72 hours. Thyroid Function Tests: Recent Labs  06/22/19 2259  TSH 3.119   Anemia Panel: No results for input(s): VITAMINB12, FOLATE, FERRITIN, TIBC, IRON, RETICCTPCT in the last 72 hours. Urine analysis: No results found for: COLORURINE, APPEARANCEUR, LABSPEC, PHURINE, GLUCOSEU, HGBUR, BILIRUBINUR, KETONESUR, PROTEINUR, UROBILINOGEN, NITRITE, LEUKOCYTESUR Sepsis Labs: @LABRCNTIP (procalcitonin:4,lacticidven:4) )No results found for this or any previous visit (from the past 240 hour(s)).   Radiological Exams on Admission: CT HEAD WO CONTRAST  Result Date: 06/22/2019 CLINICAL DATA:  Slurred speech and trouble swallowing EXAM: CT HEAD WITHOUT CONTRAST TECHNIQUE: Contiguous axial images were obtained from the base of the skull through the vertex without intravenous contrast. COMPARISON:  None. FINDINGS: Brain: No acute territorial infarction, hemorrhage or intracranial mass. Mild atrophy. Moderate hypodensity in the white matter consistent with chronic small vessel ischemic change. Nonenlarged ventricles. Vascular: No hyperdense vessels.  Carotid vascular calcification. Skull: Normal. Negative for fracture or focal lesion. Sinuses/Orbits: No acute finding. Other: None IMPRESSION: 1. No CT evidence for acute intracranial abnormality. 2. Atrophy and chronic small vessel ischemic changes of the white matter Electronically Signed   By: Donavan Foil M.D.   On: 06/22/2019 22:29   MR BRAIN WO CONTRAST  Result Date: 06/22/2019 CLINICAL DATA:  Transient ischemic attack EXAM: MRI HEAD WITHOUT CONTRAST TECHNIQUE: Multiplanar, multiecho pulse sequences of the brain and surrounding structures were obtained without intravenous contrast. COMPARISON:  None. FINDINGS: Brain: No acute infarct, acute hemorrhage or extra-axial collection. Diffuse confluent hyperintense T2-weighted signal within the periventricular, deep and juxtacortical white matter, most commonly due to chronic ischemic microangiopathy. Normal volume of CSF spaces. Single chronic microhemorrhage in the left parietal lobe. Normal midline structures. Vascular: Normal flow voids. Skull and upper cervical spine: Normal marrow signal. Sinuses/Orbits: Negative. Other: None. IMPRESSION: 1. No acute intracranial abnormality. 2. 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 Electronically Signed   By: Ulyses Jarred M.D.   On: 06/22/2019 22:37   MR LUMBAR SPINE WO CONTRAST  Result Date: 06/22/2019 CLINICAL DATA:  Bilateral leg weakness and numbness. EXAM: MRI LUMBAR SPINE WITHOUT CONTRAST TECHNIQUE: Multiplanar, multisequence MR imaging of the lumbar spine was performed. No intravenous contrast was administered. COMPARISON:  None. FINDINGS: Segmentation:  Normal Alignment: Mild retrolisthesis T11-12, T12-L1, L1-2. 8 mm anterolisthesis L4-5. Vertebrae:  Normal bone marrow.  Negative for fracture or mass. Conus medullaris and cauda equina: Conus extends to the T12-L1 level. Conus and cauda equina appear normal. Paraspinal and other soft tissues: Negative for paraspinous mass or adenopathy. Disc levels: T12-L1: 4 mm retrolisthesis. Central small disc protrusion and mild facet degeneration. Negative for stenosis L1-2: 4 mm retrolisthesis. Mild disc and facet degeneration not causing significant spinal or foraminal stenosis L2-3: Mild disc and mild facet degeneration.  Negative for  stenosis L3-4: Severe facet degeneration bilaterally. Severe spinal stenosis. Extruded disc fragment on the left with large disc fragment extending superiorly behind the left L3 vertebral body. Left L3 nerve root impingement. L4-5: Severe spinal stenosis. 8 mm anterolisthesis with severe facet degeneration bilaterally. Right foraminal disc protrusion with impingement of the right L4 nerve root due to disc protrusion and foraminal encroachment. L5-S1: Moderate facet degeneration. Moderate subarticular stenosis bilaterally with mild flattening of the S1 nerve roots bilaterally. 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 In addition, there is a large extruded disc fragment extending superiorly behind the L3 vertebral body on the left. Severe spinal stenosis L4-5 with 8 mm anterolisthesis and severe facet degeneration. Right foraminal encroachment  with right L4 nerve root compression. Electronically Signed   By: Franchot Gallo M.D.   On: 06/22/2019 08:39    EKG: Independently reviewed.  It shows sinus tachycardia with a rate of 107.  Previous infarct noted.  Low voltage in the lateral leads but no significant ST changes  Assessment/Plan Principal Problem:   Generalized weakness Active Problems:   Benign essential HTN   Hypothyroidism   Hyperlipidemia   CKD (chronic kidney disease), stage III   Leucocytosis   Hypokalemia     #1 generalized weakness with dysarthria: Slowly improving.  MRI negative.  Could be generalized weakness from neurovascular disease.  Urinalysis will be checked also for possible UTI causing weakness in this patient more acutely.  Neurology consulted.  Follow recommendations.  Patient will get PT evaluation.  Also speech therapy evaluation  #2 hypertension: Confirm and resume home regimen.  #3 hypothyroidism: Check TSH.  Continue home regimen of  levothyroxine  #4 hyperlipidemia: Continue with home regimen.  #5 chronic kidney disease stage II: Renal function appears to be normal.  GFR more than 60.  #6 hypokalemia: Replete potassium.  No obvious cause.  She is on diuretic most likely cause.  I will hold the diuretics.   DVT prophylaxis: Lovenox Code Status: Full code Family Communication: Son over the phone Disposition Plan: To be determined Consults called: Neurology Dr. Malen Gauze Admission status: Inpatient  Severity of Illness: The appropriate patient status for this patient is INPATIENT. Inpatient status is judged to be reasonable and necessary in order to provide the required intensity of service to ensure the patient's safety. The patient's presenting symptoms, physical exam findings, and initial radiographic and laboratory data in the context of their chronic comorbidities is felt to place them at high risk for further clinical deterioration. Furthermore, it is not anticipated that the patient will be medically stable for discharge from the hospital within 2 midnights of admission. The following factors support the patient status of inpatient.   " The patient's presenting symptoms include generalized weakness and a fall. " The worrisome physical exam findings include mild skin tear over her nose. " The initial radiographic and laboratory data are worrisome because of leukocytosis and hypokalemia. " The chronic co-morbidities include hypertension.   * I certify that at the point of admission it is my clinical judgment that the patient will require inpatient hospital care spanning beyond 2 midnights from the point of admission due to high intensity of service, high risk for further deterioration and high frequency of surveillance required.Barbette Merino MD [facility] Pager [phone]  If 7PM-7AM, please contact night-coverage [email or web address] Password [facility]  06/23/2019, 12:48 AM

## 2019-06-22 NOTE — ED Triage Notes (Signed)
Patient from home arrived EMS for slurred speech and trouble swallowing since Friday afternoon, since has stopped. Started taking gabapentin last Thursday. VS 170/84, HR 98. 98% RA, CBG 100.

## 2019-06-22 NOTE — ED Notes (Signed)
Pt transported to MRI 

## 2019-06-22 NOTE — ED Provider Notes (Signed)
[facility] EMERGENCY DEPARTMENT Provider Note   CSN: [id number] Arrival date & time: 06/22/19  2042     History Chief Complaint  Patient presents with  . Aphasia    [name] is a 79 y.o. female history of CKD, hypertension, high cholesterol, here presenting with trouble speaking.  She has been having trouble speaking for the last 3 days.  She states that she has trouble swallowing as well.  She never has a history of stroke before.  Denies any arm or leg weakness.  The history is provided by the patient.       Past Medical History:  Diagnosis Date  . Chronic kidney disease    CKD  . Hypercholesteremia   . Hypertension   . Hypothyroidism   . Trimalleolar fracture    RIGHT ANKLE    Patient Active Problem List   Diagnosis Date Noted  . Closed trimalleolar fracture of right ankle 10/08/2018    Past Surgical History:  Procedure Laterality Date  . ORIF ANKLE FRACTURE Right 10/09/2018   Procedure: OPEN REDUCTION INTERNAL FIXATION (ORIF) Right ankle trimalleolar fracture;  Surgeon: Wylene Simmer, MD;  Location: [place];  Service: Orthopedics;  Laterality: Right;  7min     OB History   No obstetric history on file.     Family History  Problem Relation Age of Onset  . Leukemia Sister     Social History   Tobacco Use  . Smoking status: Never Smoker  . Smokeless tobacco: Never Used  Substance Use Topics  . Alcohol use: Not Currently  . Drug use: Never    Home Medications Prior to Admission medications   Medication Sig Start Date End Date Taking? Authorizing Provider  amLODipine (NORVASC) 5 MG tablet Take 5 mg by mouth daily. 06/02/19  Yes [provider]  aspirin EC 81 MG tablet Take 81 mg by mouth every other day.   Yes [provider]  atorvastatin (LIPITOR) 20 MG tablet Take 20 mg by mouth daily.   Yes [provider]  benazepril (LOTENSIN) 40 MG tablet Take 40 mg by mouth daily.   Yes  [provider]  Calcium Carbonate-Vitamin D (CALTRATE 600+D PO) Take 1 tablet by mouth daily.    Yes [provider]  cholecalciferol (VITAMIN D3) 25 MCG (1000 UT) tablet Take 1,000 Units by mouth daily.   Yes [provider]  furosemide (LASIX) 20 MG tablet Take 10 mg by mouth every other day.    Yes [provider]  gabapentin (NEURONTIN) 100 MG capsule Take 200 mg by mouth daily.  06/16/19  Yes [provider]  Grape Seed 100 MG CAPS Take 100 mg by mouth daily. MUSCADINE SEED FOR PSORIASIS    Yes [provider]  levothyroxine (SYNTHROID) 88 MCG tablet Take 88 mcg by mouth daily before breakfast.   Yes [provider]    Allergies    Patient has no known allergies.  Review of Systems   Review of Systems  Neurological: Positive for speech difficulty.  All other systems reviewed and are negative.   Physical Exam Updated Vital Signs BP (!) 162/89   Pulse (!) 103   Temp 97.6 F (36.4 C)   Resp 20   Ht 5\' 5"  (1.651 m)   Wt 63 kg   SpO2 98%   BMI 23.13 kg/m   Physical Exam Vitals and nursing note reviewed.  Constitutional:      Comments: Chronically ill  HENT:     Head: Normocephalic.     Nose: Nose normal.     Mouth/Throat:     Mouth: Mucous membranes are moist.  Eyes:     Extraocular Movements: Extraocular movements intact.     Pupils: Pupils are equal, round, and reactive to light.  Cardiovascular:     Rate and Rhythm: Normal rate and regular rhythm.     Pulses: Normal pulses.  Pulmonary:     Effort: Pulmonary effort is normal.     Breath sounds: Normal breath sounds.  Abdominal:     General: Abdomen is flat.     Palpations: Abdomen is soft.  Musculoskeletal:        General: Normal range of motion.     Cervical back: Normal range of motion.  Skin:    General: Skin is warm.     Capillary Refill: Capillary refill takes less than 2 seconds.  Neurological:     Comments: R facial droop. Strength 4/5 R  arm, 5/5 R leg, 5/5 left side   Psychiatric:        Mood and Affect: Mood normal.     ED Results / Procedures / Treatments   Labs (all labs ordered are listed, but only abnormal results are displayed) Labs Reviewed  CBC - Abnormal; Notable for the following components:      Result Value   WBC 11.5 (*)    Hemoglobin 15.2 (*)    All other components within normal limits  COMPREHENSIVE METABOLIC PANEL - Abnormal; Notable for the following components:   CO2 19 (*)    Total Bilirubin 1.5 (*)    Anion gap 17 (*)    All other components within normal limits  I-STAT CHEM 8, ED - Abnormal; Notable for the following components:   Potassium 3.1 (*)    Calcium, Ion 1.00 (*)    TCO2 21 (*)    All other components within normal limits  ETHANOL  PROTIME-INR  APTT  DIFFERENTIAL  RAPID URINE DRUG SCREEN, HOSP PERFORMED  URINALYSIS, ROUTINE W REFLEX MICROSCOPIC    EKG EKG Interpretation  Date/Time:  Monday June 22 2019 21:03:08 EDT Ventricular Rate:  107 PR Interval:    QRS Duration: 92 QT Interval:  340 QTC Calculation: 454 R Axis:   -43 Text Interpretation: Sinus tachycardia Inferior infarct, old Anterior infarct, old No previous ECGs available Confirmed by Wandra Arthurs [phone]) on 06/22/2019 9:22:22 PM   Radiology MR LUMBAR SPINE WO CONTRAST  Result Date: 06/22/2019 CLINICAL DATA:  Bilateral leg weakness and numbness. EXAM: MRI LUMBAR SPINE WITHOUT CONTRAST TECHNIQUE: Multiplanar, multisequence MR imaging of the lumbar spine was performed. No intravenous contrast was administered. COMPARISON:  None. FINDINGS: Segmentation:  Normal Alignment: Mild retrolisthesis T11-12, T12-L1, L1-2. 8 mm anterolisthesis L4-5. Vertebrae:  Normal bone marrow.  Negative for fracture or mass. Conus medullaris and cauda equina: Conus extends to the T12-L1 level. Conus and cauda equina appear normal. Paraspinal and other soft tissues: Negative for paraspinous mass or adenopathy. Disc levels: T12-L1: 4 mm  retrolisthesis. Central small disc protrusion and mild facet degeneration. Negative for stenosis L1-2: 4 mm retrolisthesis. Mild disc and facet degeneration not causing significant spinal or foraminal stenosis L2-3: Mild disc and mild facet degeneration.  Negative for stenosis L3-4: Severe facet degeneration bilaterally. Severe spinal stenosis. Extruded disc fragment on the left with large disc fragment extending superiorly behind the left L3 vertebral body. Left L3 nerve root impingement. L4-5: Severe spinal stenosis. 8 mm anterolisthesis with severe  facet degeneration bilaterally. Right foraminal disc protrusion with impingement of the right L4 nerve root due to disc protrusion and foraminal encroachment. L5-S1: Moderate facet degeneration. Moderate subarticular stenosis bilaterally with mild flattening of the S1 nerve roots bilaterally. 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 In addition, there is a large extruded disc fragment extending superiorly behind the L3 vertebral body on the left. Severe spinal stenosis L4-5 with 8 mm anterolisthesis and severe facet degeneration. Right foraminal encroachment with right L4 nerve root compression. Electronically Signed   By: Franchot Gallo M.D.   On: 06/22/2019 08:39    Procedures Procedures (including critical care time)  Medications Ordered in ED Medications - No data to display  ED Course  I have reviewed the triage vital signs and the nursing notes.  Pertinent labs & imaging results that were available during my care of the patient were reviewed by me and considered in my medical decision making (see chart for details).    MDM Rules/Calculators/A&P                      Audrey Cowan is a 79 y.o. female here presenting with trouble speaking.  Patient has right facial droop.  Symptoms are about 72 hours already.  Will get CT  had an MRI brain.  Patient did fail swallow study so will likely need admission for stroke.   11:46 PM Labs unremarkable. MRI brain showed no stroke. But she failed swallow eval. Will admit for speech and swallow study.    Final Clinical Impression(s) / ED Diagnoses Final diagnoses:  None    Rx / DC Orders ED Discharge Orders    None       Drenda Freeze, MD 06/22/19 [phone]

## 2019-06-23 ENCOUNTER — Encounter (HOSPITAL_COMMUNITY): Payer: Self-pay | Admitting: Internal Medicine

## 2019-06-23 DIAGNOSIS — R531 Weakness: Secondary | ICD-10-CM

## 2019-06-23 DIAGNOSIS — E876 Hypokalemia: Secondary | ICD-10-CM | POA: Diagnosis present

## 2019-06-23 LAB — SARS CORONAVIRUS 2 (TAT 6-24 HRS): SARS Coronavirus 2: NEGATIVE

## 2019-06-23 LAB — TSH: TSH: 3.119 u[IU]/mL (ref 0.350–4.500)

## 2019-06-23 MED ORDER — BENAZEPRIL HCL 5 MG PO TABS: 40.0000 mg | ORAL_TABLET | Freq: Every day | ORAL | Status: DC

## 2019-06-23 MED ORDER — ENOXAPARIN SODIUM 40 MG/0.4ML ~~LOC~~ SOLN
40.0000 mg | SUBCUTANEOUS | Status: DC
  Administered 2019-06-23 – 2019-07-03 (×11): 40 mg via SUBCUTANEOUS
  Filled 2019-06-23 (×11): qty 0.4

## 2019-06-23 MED ORDER — GABAPENTIN 100 MG PO CAPS: 200.0000 mg | ORAL_CAPSULE | Freq: Every day | ORAL | Status: DC

## 2019-06-23 MED ORDER — PYRIDOSTIGMINE BROMIDE 60 MG/5ML PO SOLN
30.0000 mg | ORAL | Status: DC
  Administered 2019-06-23: 30 mg via ORAL
  Filled 2019-06-23 (×8): qty 2.5

## 2019-06-23 MED ORDER — ASPIRIN EC 81 MG PO TBEC: 81.0000 mg | DELAYED_RELEASE_TABLET | ORAL | Status: DC

## 2019-06-23 MED ORDER — KCL IN DEXTROSE-NACL 20-5-0.9 MEQ/L-%-% IV SOLN
INTRAVENOUS | Status: DC
  Filled 2019-06-23: qty 1000

## 2019-06-23 MED ORDER — LEVOTHYROXINE SODIUM 88 MCG PO TABS
88.0000 ug | ORAL_TABLET | Freq: Every day | ORAL | Status: DC
  Administered 2019-06-25 – 2019-07-04 (×10): 88 ug via ORAL
  Filled 2019-06-23 (×10): qty 1

## 2019-06-23 MED ORDER — ACETAMINOPHEN 325 MG PO TABS: 650.0000 mg | ORAL_TABLET | Freq: Four times a day (QID) | ORAL | Status: DC | PRN

## 2019-06-23 MED ORDER — HYDRALAZINE HCL 20 MG/ML IJ SOLN
5.0000 mg | INTRAMUSCULAR | Status: DC | PRN
  Administered 2019-06-23: 5 mg via INTRAVENOUS
  Filled 2019-06-23: qty 1

## 2019-06-23 MED ORDER — LIP MEDEX EX OINT
1.0000 "application " | TOPICAL_OINTMENT | CUTANEOUS | Status: DC | PRN
  Filled 2019-06-23: qty 7

## 2019-06-23 MED ORDER — ONDANSETRON HCL 4 MG PO TABS: 4.0000 mg | ORAL_TABLET | Freq: Four times a day (QID) | ORAL | Status: DC | PRN

## 2019-06-23 MED ORDER — ATORVASTATIN CALCIUM 10 MG PO TABS: 20.0000 mg | ORAL_TABLET | Freq: Every day | ORAL | Status: DC

## 2019-06-23 MED ORDER — AMLODIPINE BESYLATE 5 MG PO TABS: 5.0000 mg | ORAL_TABLET | Freq: Every day | ORAL | Status: DC

## 2019-06-23 MED ORDER — ONDANSETRON HCL 4 MG/2ML IJ SOLN: 4.0000 mg | Freq: Four times a day (QID) | INTRAMUSCULAR | Status: DC | PRN

## 2019-06-23 MED ORDER — VITAMIN D 25 MCG (1000 UNIT) PO TABS: 1000.0000 [IU] | ORAL_TABLET | Freq: Every day | ORAL | Status: DC

## 2019-06-23 MED ORDER — DEXTROSE-NACL 5-0.9 % IV SOLN: INTRAVENOUS | Status: AC

## 2019-06-23 MED ORDER — ACETAMINOPHEN 650 MG RE SUPP: 650.0000 mg | Freq: Four times a day (QID) | RECTAL | Status: DC | PRN

## 2019-06-23 NOTE — Evaluation (Signed)
Physical Therapy Evaluation Patient Details Name: Audrey Cowan MRN: [id number] DOB: [birth year] Today's Date: 06/23/2019   History of Present Illness  79 year old female with fatigable dysphagia, ptosis, dysarthria following Neurontin administration.    Clinical Impression  Pt admitted with above diagnosis. Pt was able to ambulate with RW with min guard assist and cues.  At times gross motor coordination impaired.  Still being worked up for neuro issues. Will follow acutely.  Pt currently with functional limitations due to the deficits listed below (see PT Problem List). Pt will benefit from skilled PT to increase their independence and safety with mobility to allow discharge to the venue listed below.      Follow Up Recommendations Home health PT;Supervision/Assistance - 24 hour    Equipment Recommendations  None recommended by PT    Recommendations for Other Services       Precautions / Restrictions Precautions Precautions: Fall Restrictions Weight Bearing Restrictions: No      Mobility  Bed Mobility Overal bed mobility: Independent                Transfers Overall transfer level: Needs assistance Equipment used: Rolling walker (2 wheeled) Transfers: Sit to/from Stand Sit to Stand: Min assist         General transfer comment: cues for hand placement and a little assist to  power up  Ambulation/Gait Ambulation/Gait assistance: Min guard Gait Distance (Feet): 50 Feet(25 feet x 2) Assistive device: Rolling walker (2 wheeled) Gait Pattern/deviations: Decreased stride length;Step-through pattern   Gait velocity interpretation: <1.31 ft/sec, indicative of household ambulator General Gait Details: Pt walked to bathroom and back to recliner.  Pt fairly steady with RW however does need cues for safety and at times due to gross motor decr coordination at times of bil LES.   Stairs            Wheelchair Mobility    Modified Rankin (Stroke Patients Only)        Balance Overall balance assessment: Needs assistance Sitting-balance support: No upper extremity supported;Feet supported Sitting balance-Leahy Scale: Fair     Standing balance support: Bilateral upper extremity supported;During functional activity Standing balance-Leahy Scale: Poor Standing balance comment: relies on UE support                             Pertinent Vitals/Pain Pain Assessment: No/denies pain    Home Living Family/patient expects to be discharged to:: Private residence Living Arrangements: Children Available Help at Discharge: Family;Available PRN/intermittently(son works) Type of Home: House Home Access: Level entry     Home Layout: One level Home Equipment: [place] - single point;Crutches;Walker - 2 wheels;Wheelchair - manual;Hand held shower head      Prior Function Level of Independence: Independent with assistive device(s)         Comments: used RW PTA, sink bathes, did shower if someone was there     Hand Dominance   Dominant Hand: Right    Extremity/Trunk Assessment   Upper Extremity Assessment Upper Extremity Assessment: RUE deficits/detail;LUE deficits/detail RUE Coordination: decreased gross motor LUE Coordination: decreased gross motor    Lower Extremity Assessment Lower Extremity Assessment: RLE deficits/detail;LLE deficits/detail RLE Coordination: decreased gross motor LLE Coordination: decreased gross motor    Cervical / Trunk Assessment Cervical / Trunk Assessment: Normal  Communication   Communication: Expressive difficulties  Cognition Arousal/Alertness: Lethargic Behavior During Therapy: Flat affect Overall Cognitive Status: Within Functional Limits for tasks assessed  General Comments      Exercises     Assessment/Plan    PT Assessment Patient needs continued PT services  PT Problem List Decreased activity tolerance;Decreased  balance;Decreased mobility;Decreased knowledge of use of DME;Decreased safety awareness;Decreased knowledge of precautions;Decreased coordination       PT Treatment Interventions DME instruction;Gait training;Therapeutic activities;Functional mobility training;Therapeutic exercise;Balance training;Patient/family education    PT Goals (Current goals can be found in the Care Plan section)  Acute Rehab PT Goals Patient Stated Goal: to go home PT Goal Formulation: With patient Time For Goal Achievement: 07/07/19 Potential to Achieve Goals: Good    Frequency Min 3X/week   Barriers to discharge Decreased caregiver support(son works)      Co-evaluation               AM-PAC PT "6 Clicks" Mobility  Outcome Measure Help needed turning from your back to your side while in a flat bed without using bedrails?: None Help needed moving from lying on your back to sitting on the side of a flat bed without using bedrails?: None Help needed moving to and from a bed to a chair (including a wheelchair)?: A Little Help needed standing up from a chair using your arms (e.g., wheelchair or bedside chair)?: A Little Help needed to walk in hospital room?: A Little Help needed climbing 3-5 steps with a railing? : A Little 6 Click Score: 20    End of Session Equipment Utilized During Treatment: Gait belt Activity Tolerance: Patient limited by fatigue Patient left: in chair;with call bell/phone within reach;with family/visitor present Nurse Communication: Mobility status(tray delivered and pt with dysphagia, took tray out) PT Visit Diagnosis: Unsteadiness on feet (R26.81);Muscle weakness (generalized) (M62.81)    Time: [id number] PT Time Calculation (min) (ACUTE ONLY): 33 min   Charges:   PT Evaluation $PT Eval Moderate Complexity: 1 Mod PT Treatments $Gait Training: 8-22 mins        Jenin Birdsall W,PT Acute Rehabilitation Services Pager:  [phone]  Office:  [place] 06/23/2019, 3:12 PM

## 2019-06-23 NOTE — Progress Notes (Signed)
Initial Nutrition Assessment  DOCUMENTATION CODES:   Not applicable  INTERVENTION:   Recommend swallow evaluation with SLP to determine safest diet consistency.  If unable to safely advance diet within the next 24-48 hours, consider Cortrak tube placement for enteral nutrition support. Cortrak service is available M-W-F 8am-4pm.  For TF, recommend Jevity 1.2 at 60 ml/h to provide 1728 kcal, 80 gm protein, 1181 ml free water daily.  NUTRITION DIAGNOSIS:   Inadequate oral intake related to dysphagia as evidenced by NPO status.  GOAL:   Patient will meet greater than or equal to 90% of their needs  MONITOR:   Diet advancement, PO intake, Labs  REASON FOR ASSESSMENT:   Malnutrition Screening Tool    ASSESSMENT:   79 yo female admitted with difficulty speaking and swallowing x 3 days. PMH includes HTN, HLD, hypothyroidism, recent R ankle fx, neuropathy, vitamin B-12 deficiency, CKD III.   Work-up for myasthenia gravis is underway. Neurology following with plans to begin Mestinon and see if patient notices symptomatic improvement, then consider IVIG. Patient is currently NPO.  Labs reviewed. Potassium 3.1 (L), ionized calcium 1 (L) Medications reviewed. IVF: D5NS at 50 ml/h.  Patient reported weight loss and poor intake related to decreased appetite on admission. Usual weights reviewed. Patient has lost 13% of usual weight in the past 8 months, not significant for the time frame.  NUTRITION - FOCUSED PHYSICAL EXAM:  unable to complete  Diet Order:   Diet Order            Diet NPO time specified  Diet effective now              EDUCATION NEEDS:   Not appropriate for education at this time  Skin:  Skin Assessment: Reviewed RN Assessment  Last BM:  no BM documented  Height:   Ht Readings from Last 1 Encounters:  06/23/19 5\' 5"  (1.651 m)    Weight:   Wt Readings from Last 1 Encounters:  06/23/19 63 kg    Ideal Body Weight:  56.8 kg  BMI:  Body  mass index is 23.13 kg/m.  Estimated Nutritional Needs:   Kcal:  1650-1850  Protein:  80-95 gm  Fluid:  1.7-1.9 L    Molli Barrows, RD, LDN, CNSC Please refer to Amion for contact information.

## 2019-06-23 NOTE — ED Notes (Signed)
Ariellah Dooney, son, [phone] would like an update when you get a chance

## 2019-06-23 NOTE — Progress Notes (Signed)
PROGRESS NOTE    Audrey Cowan  WGN:[id number] DOB: [birth year] DOA: 06/22/2019 PCP: Carol Ada, MD   Brief Narrative:  19 YOF with hypertension, HLD, hypothyroidism, recent right ankle fracture in Jan , neuropathy, vitamin B12 deficiency, CKD III presented to ER with difficulty speaking and swallowing x 3 days. She presented to the ER where he was afebrile vitals stable, WBC 11.5K, potassium 3.1 COVID-19 negative, CVA was suspected and  CT/MRI brain was obtained that was unremarkable and neurology was consulted suggested possible neurovascular disease including myasthenia gravis patient was admitted for further management.  Subjective:  AAOX3, feels better and somewhat stronger. Denies chest pain,. Able to speak . Afebrile overnight with T-max 98.5, blood pressure variable 110-170s.   Assessment & Plan:  Generalized weakness with dysarthria: No evidence of acute stroke on CT and MRI, seen by neurology suspecting myasthenia gravis exacerbation versus Neurontin toxicity.  Plan is to monitor frequent NIF, FVC, follow-up UDS/UA, hold Neurontin, follow-up anti-MU SK antibodies, ACR's antibodies.  Her TSH level stable, speech therapy PT/ot TO FOLLOW. Unable ot perform NIF per RT. reviewed neuro inputs, symptoms worse with more activity unable to do NIF- Neuro trying Mestinon 30 mg every 4 hours and if symptom improves then IVIG. NPO- Cont SLP eval. Add gentle IV fluid while n.p.o.  Benign essential HTN-BP variable.  Monitor. AMLODIPINE, ACEI, LASIX on hold  Hypokalemia repleted  HLD-resume statins once able ot ta kepo  Hypothyroidism-tsh stable  H x of CKD (chronic kidney disease), stage III a- creat stable and clearance is > 60.monitor.  Leucocytosis-monitor. Check UA.  Had covid shots x2 , last feb 6.  Body mass index is 23.13 kg/m.   DVT prophylaxis:lovenox Code Status:FULL*  Family Communication: plan of care discussed with patient at bedside. Disposition Plan: Patient  is from:home Anticipated Disposition: TBD Barriers to discharge or conditions that needs to be met prior to discharge: Patient admitted with worsening generalized weakness dysarthria swallowing difficulties concern for myasthenia gravis getting further work-up treatment, neurology following.  Remains hospitalized for next 2-3 days at least.  Consultants: Neurology  Procedures:see note Microbiology:see note  Diet Order            Diet NPO time specified  Diet effective now              Awaiting SLP eval  Medications: Scheduled Meds: Continuous Infusions:  Antimicrobials: Anti-infectives (From admission, onward)   None       Objective: Vitals: Today's Vitals   06/23/19 0301 06/23/19 0652 06/23/19 0850 06/23/19 0933  BP: (!) 173/94   (!) 142/81  Pulse: (!) 104   96  Resp: (!) _0 Temp: 98.5 F (36.9 C)   97.7 F (36.5 C)  TempSrc: Oral     SpO2:    98%  Weight: 63 kg     Height: _1  (1.651 m)     PainSc:   0-No pain    No intake or output data in the 24 hours ending 06/23/19 1038 Filed Weights   06/22/19 2046 06/23/19 0301  Weight: 63 kg 63 kg   Weight change:    Intake/Output from previous day: No intake/output data recorded. Intake/Output this shift: No intake/output data recorded.  Examination:  General exam: AAOx3on RA, ,NAD, weak appearing. HEENT:Oral mucosa moist, Ear/Nose WNL grossly,dentition normal. Respiratory system: bilaterally clear,no wheezing or crackles,no use of accessory muscle, non tender. Cardiovascular system: S1 & S2 +, regular, No JVD. Gastrointestinal system: Abdomen soft, NT,ND, BS+. Nervous  System:Alert, awake, moving extremities and grossly nonfocal Extremities: No edema, distal peripheral pulses palpable.  Skin: No rashes,no icterus. MSK: Normal muscle bulk,tone, power  Data Reviewed: I have personally reviewed following labs and imaging studies CBC: Recent Labs  Lab 06/22/19 2101 06/22/19 2110  WBC 11.5*  --    NEUTROABS 7.7  --   HGB 15.2* 12.2  HCT 44.8 36.0  MCV 88.0  --   PLT 380  --    Basic Metabolic Panel: Recent Labs  Lab 06/22/19 2101 06/22/19 2110  NA 137 140  K 3.9 3.1*  CL 101 106  CO2 19*  --   GLUCOSE 98 85  BUN 17 18  CREATININE 0.91 0.50  CALCIUM 9.2  --    GFR: Estimated Creatinine Clearance: 53 mL/min (by C-G formula based on SCr of 0.5 mg/dL). Liver Function Tests: Recent Labs  Lab 06/22/19 2101  AST 31  ALT 18  ALKPHOS 76  BILITOT 1.5*  PROT 7.3  ALBUMIN 4.3   No results for input(s): LIPASE, AMYLASE in the last 168 hours. No results for input(s): AMMONIA in the last 168 hours. Coagulation Profile: Recent Labs  Lab 06/22/19 2101  INR 1.0   Cardiac Enzymes: No results for input(s): CKTOTAL, CKMB, CKMBINDEX, TROPONINI in the last 168 hours. BNP (last 3 results) No results for input(s): PROBNP in the last 8760 hours. HbA1C: No results for input(s): HGBA1C in the last 72 hours. CBG: No results for input(s): GLUCAP in the last 168 hours. Lipid Profile: No results for input(s): CHOL, HDL, LDLCALC, TRIG, CHOLHDL, LDLDIRECT in the last 72 hours. Thyroid Function Tests: Recent Labs    06/22/19 2259  TSH 3.119   Anemia Panel: No results for input(s): VITAMINB12, FOLATE, FERRITIN, TIBC, IRON, RETICCTPCT in the last 72 hours. Sepsis Labs: No results for input(s): PROCALCITON, LATICACIDVEN in the last 168 hours.  Recent Results (from the past 240 hour(s))  SARS CORONAVIRUS 2 (TAT 6-24 HRS) Nasopharyngeal Nasopharyngeal Swab     Status: None   Collection Time: 06/22/19 10:59 PM   Specimen: Nasopharyngeal Swab  Result Value Ref Range Status   SARS Coronavirus 2 NEGATIVE NEGATIVE Final    Comment: (NOTE) SARS-CoV-2 target nucleic acids are NOT DETECTED. The SARS-CoV-2 RNA is generally detectable in upper and lower respiratory specimens during the acute phase of infection. Negative results do not preclude SARS-CoV-2 infection, do not rule  out co-infections with other pathogens, and should not be used as the sole basis for treatment or other patient management decisions. Negative results must be combined with clinical observations, patient history, and epidemiological information. The expected result is Negative. Fact Sheet for Patients: SugarRoll.be Fact Sheet for Healthcare Providers: https://www.woods-mathews.com/ This test is not yet approved or cleared by the [place] FDA and  has been authorized for detection and/or diagnosis of SARS-CoV-2 by FDA under an Emergency Use Authorization (EUA). This EUA will remain  in effect (meaning this test can be used) for the duration of the COVID-19 declaration under Section 56 4(b)(1) of the Act, 21 U.S.C. section 360bbb-3(b)(1), unless the authorization is terminated or revoked sooner. Performed at [facility], [place] [address] [place], [place] [address]       Radiology Studies: CT HEAD WO CONTRAST  Result Date: 06/22/2019 CLINICAL DATA:  Slurred speech and trouble swallowing EXAM: CT HEAD WITHOUT CONTRAST TECHNIQUE: Contiguous axial images were obtained from the base of the skull through the vertex without intravenous contrast. COMPARISON:  None. FINDINGS: Brain: No acute territorial  infarction, hemorrhage or intracranial mass. Mild atrophy. Moderate hypodensity in the white matter consistent with chronic small vessel ischemic change. Nonenlarged ventricles. Vascular: No hyperdense vessels.  Carotid vascular calcification. Skull: Normal. Negative for fracture or focal lesion. Sinuses/Orbits: No acute finding. Other: None IMPRESSION: 1. No CT evidence for acute intracranial abnormality. 2. Atrophy and chronic small vessel ischemic changes of the white matter Electronically Signed   By: Donavan Foil M.D.   On: 06/22/2019 22:29   MR BRAIN WO CONTRAST  Result Date: 06/22/2019 CLINICAL DATA:  Transient ischemic attack EXAM: MRI  HEAD WITHOUT CONTRAST TECHNIQUE: Multiplanar, multiecho pulse sequences of the brain and surrounding structures were obtained without intravenous contrast. COMPARISON:  None. FINDINGS: Brain: No acute infarct, acute hemorrhage or extra-axial collection. Diffuse confluent hyperintense T2-weighted signal within the periventricular, deep and juxtacortical white matter, most commonly due to chronic ischemic microangiopathy. Normal volume of CSF spaces. Single chronic microhemorrhage in the left parietal lobe. Normal midline structures. Vascular: Normal flow voids. Skull and upper cervical spine: Normal marrow signal. Sinuses/Orbits: Negative. Other: None. IMPRESSION: 1. No acute intracranial abnormality. 2. 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 Electronically Signed   By: Ulyses Jarred M.D.   On: 06/22/2019 22:37   MR LUMBAR SPINE WO CONTRAST  Result Date: 06/22/2019 CLINICAL DATA:  Bilateral leg weakness and numbness. EXAM: MRI LUMBAR SPINE WITHOUT CONTRAST TECHNIQUE: Multiplanar, multisequence MR imaging of the lumbar spine was performed. No intravenous contrast was administered. COMPARISON:  None. FINDINGS: Segmentation:  Normal Alignment: Mild retrolisthesis T11-12, T12-L1, L1-2. 8 mm anterolisthesis L4-5. Vertebrae:  Normal bone marrow.  Negative for fracture or mass. Conus medullaris and cauda equina: Conus extends to the T12-L1 level. Conus and cauda equina appear normal. Paraspinal and other soft tissues: Negative for paraspinous mass or adenopathy. Disc levels: T12-L1: 4 mm retrolisthesis. Central small disc protrusion and mild facet degeneration. Negative for stenosis L1-2: 4 mm retrolisthesis. Mild disc and facet degeneration not causing significant spinal or foraminal stenosis L2-3: Mild disc and mild facet degeneration.  Negative for stenosis L3-4: Severe facet degeneration bilaterally. Severe spinal stenosis. Extruded disc fragment on the left with large disc fragment  extending superiorly behind the left L3 vertebral body. Left L3 nerve root impingement. L4-5: Severe spinal stenosis. 8 mm anterolisthesis with severe facet degeneration bilaterally. Right foraminal disc protrusion with impingement of the right L4 nerve root due to disc protrusion and foraminal encroachment. L5-S1: Moderate facet degeneration. Moderate subarticular stenosis bilaterally with mild flattening of the S1 nerve roots bilaterally. 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 In addition, there is a large extruded disc fragment extending superiorly behind the L3 vertebral body on the left. Severe spinal stenosis L4-5 with 8 mm anterolisthesis and severe facet degeneration. Right foraminal encroachment with right L4 nerve root compression. Electronically Signed   By: Franchot Gallo M.D.   On: 06/22/2019 08:39     LOS: 1 day   Time spent: More than 50% of that time was spent in counseling and/or coordination of care.  Antonieta Pert, MD [facility]  06/23/2019, 10:38 AM

## 2019-06-23 NOTE — Progress Notes (Signed)
VC: 0.8L NIF: -10 with slight effort. Patient was becoming  frustrated trying to understand how to perform the maneuver.

## 2019-06-23 NOTE — Progress Notes (Signed)
VC: .9L with excellent patient effort.  NIF: Patient was unable to do despite RT coaching patient & multiple attempts. Patient states " I cannot breathe when I do that."

## 2019-06-23 NOTE — Progress Notes (Signed)
RT attempted NIF and Vital capacity with pt. Pt. Performed .9L on the vital capacity. Pt. Was unable to perform the NIF at this time. Pt. Appears tired and restless.

## 2019-06-23 NOTE — Progress Notes (Signed)
Subjective: Continues to be dysarthric, gets worse the more she talks  She does not have any history prior to starting Neurontin of ptosis, dysphagia, dysarthria.  She does describe fatigue, but states that she just attributed it to old age.  Exam: Vitals:   06/23/19 0652 06/23/19 0933  BP:  (!) 142/81  Pulse:  96  Resp: 20 18  Temp:  97.7 F (36.5 C)  SpO2:  98%   Gen: In bed, NAD Resp: non-labored breathing, no acute distress Abd: soft, nt  Neuro: MS: Awake, alert, interactive and appropriate CN: Pupils equal round and reactive, she has mild left ptosis which considerably worsens with upgaze, no diplopia Motor: She has 3/5 neck flexion, 3/5 proximal shoulder abduction, 4/5 distally. Sensory: Symmetric to light touch  Pertinent Labs: CMP-relatively unremarkable TSH is normal  Impression: 79 year old female with fatigable dysphagia, ptosis, dysarthria following Neurontin administration.  There have been case reports describing worsening of myasthenic symptoms after Neurontin dosing, and it is possible that she was just subsymptomatic. I will try mestinon to see if she notices symptomatic improvement, if so, then will likely start IVIG.   Recommendations: 1) Mestion 30mg  Q4H 2) will consider IVIG.   Roland Rack, MD [facility] [phone]  If 7pm- 7am, please page neurology on call as listed in [place].

## 2019-06-23 NOTE — Evaluation (Signed)
Clinical/Bedside Swallow Evaluation Patient Details  Name: Audrey Cowan MRN: [id number] Date of Birth: 1941-01-04  Today's Date: 06/23/2019 Time: SLP Start Time (ACUTE ONLY): 1545 SLP Stop Time (ACUTE ONLY): 1611 SLP Time Calculation (min) (ACUTE ONLY): 26 min  Past Medical History:  Past Medical History:  Diagnosis Date  . Chronic kidney disease    CKD  . Hypercholesteremia   . Hypertension   . Hypothyroidism   . Trimalleolar fracture    RIGHT ANKLE   Past Surgical History:  Past Surgical History:  Procedure Laterality Date  . ORIF ANKLE FRACTURE Right 10/09/2018   Procedure: OPEN REDUCTION INTERNAL FIXATION (ORIF) Right ankle trimalleolar fracture;  Surgeon: Wylene Simmer, MD;  Location: [place];  Service: Orthopedics;  Laterality: Right;  44min   HPI:  Audrey Cowan is a 79 y.o. female with PMH of hypertension, hyperlipidemia, hypothyroidism, right ankle fracture, neuropathy, CKD, fatigable dysphagia, ptosis, dysarthria following Neurontin administration.   Assessment / Plan / Recommendation Clinical Impression   Pt encountered in bed with son present for session. Pt's oral mech was unremarkable and her voice remained clear t/o. She was noted with dysarthria and reduced intelligibility at the word level, becoming less intelligible throughout the session. Pt was observed with ice chips, thin liquids, and puree. Following all trails, pt was noted with delayed AP transit and suspected delayed swallow initiation. Pt's swallow was initially more swift, becoming seemingly more delayed with progression. No s/sx of aspiration were noted, however, trials were limited as the pt began fatiguing. By the end of the session, the pt had difficulty even managing her secretions. The pt stated she "felt like something was sitting on the back of her tongue," but her mouth remained clear. Given pt's difficulties and unknown etiology, recommend continuing with NPO and proceed with an  instrumental swallow study to further assess integrity of swallow function. May give necessary liquid meds PO via spoon, ensuring she takes small sips at a time, and holding with any overt s/sx of aspiration.  SLP Visit Diagnosis: Dysphagia, unspecified (R13.10)    Aspiration Risk  Mild aspiration risk    Diet Recommendation NPO   Medication Administration: Via alternative means    Other  Recommendations Oral Care Recommendations: Oral care BID   Follow up Recommendations Other (comment)(tba)      Frequency and Duration min 2x/week  2 weeks       Prognosis Prognosis for Safe Diet Advancement: Good      Swallow Study   General Date of Onset: 06/22/19 HPI: Audrey Cowan is a 79 y.o. female with PMH of hypertension, hyperlipidemia, hypothyroidism, right ankle fracture, neuropathy, CKD, fatigable dysphagia, ptosis, dysarthria following Neurontin administration. Type of Study: Bedside Swallow Evaluation Previous Swallow Assessment: none in chart Diet Prior to this Study: NPO Temperature Spikes Noted: No Respiratory Status: Room air History of Recent Intubation: No Behavior/Cognition: Alert;Cooperative Oral Cavity Assessment: Within Functional Limits Oral Care Completed by SLP: No Oral Cavity - Dentition: Poor condition;Missing dentition Vision: Functional for self-feeding Self-Feeding Abilities: Able to feed self;Needs assist Patient Positioning: Upright in bed Baseline Vocal Quality: Normal Volitional Swallow: Able to elicit    Oral/Motor/Sensory Function Overall Oral Motor/Sensory Function: Within functional limits   Ice Chips Ice chips: Impaired Presentation: Spoon Pharyngeal Phase Impairments: Suspected delayed Swallow   Thin Liquid Thin Liquid: Impaired Pharyngeal  Phase Impairments: Suspected delayed Swallow    Nectar Thick Nectar Thick Liquid: Not tested   Honey Thick Honey Thick Liquid: Not tested  Puree Puree: Impaired Presentation: Spoon Pharyngeal  Phase Impairments: Suspected delayed Swallow   Solid     Solid: Not tested     Aline August, Student SLP Office: [phone]  06/23/2019,4:23 PM

## 2019-06-23 NOTE — Consult Note (Signed)
Requesting Physician: Dr. Darl Householder    Chief Complaint: Slurred speech  History obtained from: Patient and Chart    HPI:                                                                                                                                       Audrey Cowan is a 79 y.o. female with past medical history significant for hypertension, hyperlipidemia, hypothyroidism, right ankle fracture, neuropathy, CKD, vitamin B12 deficiency presents to the emergency department with difficulty swallowing and worsening slurred speech over the last 3 days.  The patient was recently started on Neurontin 100 mg twice daily Thursday for neuropathy.  She took the medicine Thursday and Friday and on Saturday, her son noticed her speech was more slurred.  He suggests that she stopped taking the medicine and she has stopped using it since.  Her symptoms progressed with worsening slurred speech, unable to keep her eyelids open and difficulty with swallowing.  She denies any prior recent history of double vision, fatigue, shortness of breath or any of the above symptoms until 3 days ago.  She received both doses of her Covid vaccine over a month ago.  She has been complaining of paresthesias for over 6 months.  Her most recent B12 levels were over 1000.  Denies any recent fevers, chills.  She is evaluated in the ED and MRI brain was obtained to rule out acute stroke which was negative.  She also had a recent MRI of the L-spine from neuropathy-showed spinal stenosis with no cord compression.   Past Medical History:  Diagnosis Date  . Chronic kidney disease    CKD  . Hypercholesteremia   . Hypertension   . Hypothyroidism   . Trimalleolar fracture    RIGHT ANKLE    Past Surgical History:  Procedure Laterality Date  . ORIF ANKLE FRACTURE Right 10/09/2018   Procedure: OPEN REDUCTION INTERNAL FIXATION (ORIF) Right ankle trimalleolar fracture;  Surgeon: Wylene Simmer, MD;  Location: [place];   Service: Orthopedics;  Laterality: Right;  41min    Family History  Problem Relation Age of Onset  . Leukemia Sister    Social History:  reports that she has never smoked. She has never used smokeless tobacco. She reports previous alcohol use. She reports that she does not use drugs.  Allergies: No Known Allergies  Medications:  I reviewed home medications   ROS:                                                                                                                                     14 systems reviewed and negative except above    Examination:                                                                                                      General: Appears well-developed and well-nourished.  Psych: Affect appropriate to situation Eyes: No scleral injection HENT: No OP obstrucion Head: Normocephalic.  Cardiovascular: Normal rate and regular rhythm.  Respiratory: Effort normal and breath sounds normal to anterior ascultation GI: Soft.  No distension. There is no tenderness.  Skin: WDI    Neurological Examination Mental Status: Alert, oriented, thought content appropriate.  Speech fluent without evidence of aphasia.  Slurred speech.  Able to follow 3 step commands without difficulty. Cranial Nerves: II: Visual fields grossly normal,  III,IV, VI: Bilateral ptosis, extra-ocular motions intact bilaterally, pupils equal, round, reactive to light and accommodation V,VII: smile symmetric, facial light touch sensation normal bilaterally VIII: hearing impaired bilaterally IX,X: uvula rises symmetrically XI: bilateral shoulder shrug XII: midline tongue extension Motor: Right : Upper extremity   5/5    Left:     Upper extremity   5/5  Lower extremity   5/5     Lower extremity   5/5 Tone and bulk:normal tone throughout; no atrophy noted Sensory: Pinprick  and light touch intact throughout, bilaterally Deep Tendon Reflexes: 2+ and symmetric throughout Plantars: Right: downgoing   Left: downgoing Cerebellar: normal finger-to-nose Gait: Not assessed     Lab Results: Basic Metabolic Panel: Recent Labs  Lab 06/22/19 07-30-99 06/22/19 2110  NA 137 140  K 3.9 3.1*  CL 101 106  CO2 19*  --   GLUCOSE 98 85  BUN 17 18  CREATININE 0.91 0.50  CALCIUM 9.2  --     CBC: Recent Labs  Lab 06/22/19 07-30-99 06/22/19 2110  WBC 11.5*  --   NEUTROABS 7.7  --   HGB 15.2* 12.2  HCT 44.8 36.0  MCV 88.0  --   PLT 380  --     Coagulation Studies: Recent Labs    06/22/19 07-30-99  LABPROT 13.2  INR 1.0    Imaging: CT HEAD WO CONTRAST  Result Date: 06/22/2019 CLINICAL DATA:  Slurred speech and trouble swallowing EXAM: CT HEAD WITHOUT CONTRAST TECHNIQUE: Contiguous axial images were  obtained from the base of the skull through the vertex without intravenous contrast. COMPARISON:  None. FINDINGS: Brain: No acute territorial infarction, hemorrhage or intracranial mass. Mild atrophy. Moderate hypodensity in the white matter consistent with chronic small vessel ischemic change. Nonenlarged ventricles. Vascular: No hyperdense vessels.  Carotid vascular calcification. Skull: Normal. Negative for fracture or focal lesion. Sinuses/Orbits: No acute finding. Other: None IMPRESSION: 1. No CT evidence for acute intracranial abnormality. 2. Atrophy and chronic small vessel ischemic changes of the white matter Electronically Signed   By: Donavan Foil M.D.   On: 06/22/2019 22:29   MR BRAIN WO CONTRAST  Result Date: 06/22/2019 CLINICAL DATA:  Transient ischemic attack EXAM: MRI HEAD WITHOUT CONTRAST TECHNIQUE: Multiplanar, multiecho pulse sequences of the brain and surrounding structures were obtained without intravenous contrast. COMPARISON:  None. FINDINGS: Brain: No acute infarct, acute hemorrhage or extra-axial collection. Diffuse confluent hyperintense T2-weighted  signal within the periventricular, deep and juxtacortical white matter, most commonly due to chronic ischemic microangiopathy. Normal volume of CSF spaces. Single chronic microhemorrhage in the left parietal lobe. Normal midline structures. Vascular: Normal flow voids. Skull and upper cervical spine: Normal marrow signal. Sinuses/Orbits: Negative. Other: None. IMPRESSION: 1. No acute intracranial abnormality. 2. 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 Electronically Signed   By: Ulyses Jarred M.D.   On: 06/22/2019 22:37   MR LUMBAR SPINE WO CONTRAST  Result Date: 06/22/2019 CLINICAL DATA:  Bilateral leg weakness and numbness. EXAM: MRI LUMBAR SPINE WITHOUT CONTRAST TECHNIQUE: Multiplanar, multisequence MR imaging of the lumbar spine was performed. No intravenous contrast was administered. COMPARISON:  None. FINDINGS: Segmentation:  Normal Alignment: Mild retrolisthesis T11-12, T12-L1, L1-2. 8 mm anterolisthesis L4-5. Vertebrae:  Normal bone marrow.  Negative for fracture or mass. Conus medullaris and cauda equina: Conus extends to the T12-L1 level. Conus and cauda equina appear normal. Paraspinal and other soft tissues: Negative for paraspinous mass or adenopathy. Disc levels: T12-L1: 4 mm retrolisthesis. Central small disc protrusion and mild facet degeneration. Negative for stenosis L1-2: 4 mm retrolisthesis. Mild disc and facet degeneration not causing significant spinal or foraminal stenosis L2-3: Mild disc and mild facet degeneration.  Negative for stenosis L3-4: Severe facet degeneration bilaterally. Severe spinal stenosis. Extruded disc fragment on the left with large disc fragment extending superiorly behind the left L3 vertebral body. Left L3 nerve root impingement. L4-5: Severe spinal stenosis. 8 mm anterolisthesis with severe facet degeneration bilaterally. Right foraminal disc protrusion with impingement of the right L4 nerve root due to disc protrusion and foraminal  encroachment. L5-S1: Moderate facet degeneration. Moderate subarticular stenosis bilaterally with mild flattening of the S1 nerve roots bilaterally. 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 In addition, there is a large extruded disc fragment extending superiorly behind the L3 vertebral body on the left. Severe spinal stenosis L4-5 with 8 mm anterolisthesis and severe facet degeneration. Right foraminal encroachment with right L4 nerve root compression. Electronically Signed   By: Franchot Gallo M.D.   On: 06/22/2019 08:39     I have reviewed the above imaging: MRI brain showed no acute stroke   ASSESSMENT AND PLAN   79 y.o. female with past medical history significant for hypertension, hyperlipidemia, hypothyroidism, right ankle fracture, neuropathy, CKD, vitamin B12 deficiency presents to the emergency department with difficulty swallowing and worsening slurred speech over the last 3 days.  Clinically appears as myasthenia gravis, other conditions include Lambert-Eaton syndrome.  However time course is fairly short and atypical for myasthenia.  Currently appears to be able to protect her airway, single breath count greater than 20.  Formal speech/swallow evaluation pending-we will need to check barium swallow.   ? Myasthenia Exacerbation vs Neurontin toxicity   Recommendations Frequent NIF, FVC Follow-up and urine analysis, UDS Continue to hold Neurontin Ordered anti-MuSK antibodies, ACRH antibodies TSH levels : normal  Speech therapy and swallow evaluation  Cartez Mogle [facility] Pager Number [phone]

## 2019-06-24 ENCOUNTER — Inpatient Hospital Stay (HOSPITAL_COMMUNITY): Payer: Medicare Other

## 2019-06-24 LAB — BLOOD GAS, ARTERIAL
Acid-base deficit: 5.1 mmol/L — ABNORMAL HIGH (ref 0.0–2.0)
Bicarbonate: 20.4 mmol/L (ref 20.0–28.0)
FIO2: 28
O2 Saturation: 97.5 %
Patient temperature: 36.8
pCO2 arterial: 43.8 mmHg (ref 32.0–48.0)
pH, Arterial: 7.289 — ABNORMAL LOW (ref 7.350–7.450)
pO2, Arterial: 117 mmHg — ABNORMAL HIGH (ref 83.0–108.0)

## 2019-06-24 LAB — CBC
HCT: 44.5 % (ref 36.0–46.0)
Hemoglobin: 15.1 g/dL — ABNORMAL HIGH (ref 12.0–15.0)
MCH: 30.2 pg (ref 26.0–34.0)
MCHC: 33.9 g/dL (ref 30.0–36.0)
MCV: 89 fL (ref 80.0–100.0)
Platelets: 393 10*3/uL (ref 150–400)
RBC: 5 MIL/uL (ref 3.87–5.11)
RDW: 13.3 % (ref 11.5–15.5)
WBC: 19.8 10*3/uL — ABNORMAL HIGH (ref 4.0–10.5)
nRBC: 0 % (ref 0.0–0.2)

## 2019-06-24 LAB — GLUCOSE, CAPILLARY
Glucose-Capillary: 101 mg/dL — ABNORMAL HIGH (ref 70–99)
Glucose-Capillary: 106 mg/dL — ABNORMAL HIGH (ref 70–99)
Glucose-Capillary: 111 mg/dL — ABNORMAL HIGH (ref 70–99)
Glucose-Capillary: 95 mg/dL (ref 70–99)

## 2019-06-24 LAB — BASIC METABOLIC PANEL
Anion gap: 14 (ref 5–15)
BUN: 22 mg/dL (ref 8–23)
CO2: 19 mmol/L — ABNORMAL LOW (ref 22–32)
Calcium: 9.7 mg/dL (ref 8.9–10.3)
Chloride: 108 mmol/L (ref 98–111)
Creatinine, Ser: 0.9 mg/dL (ref 0.44–1.00)
GFR calc Af Amer: >60 mL/min (ref >60)
GFR calc non Af Amer: >60 mL/min (ref >60)
Glucose, Bld: 146 mg/dL — ABNORMAL HIGH (ref 70–99)
Potassium: 4.6 mmol/L (ref 3.5–5.1)
Sodium: 141 mmol/L (ref 135–145)

## 2019-06-24 IMAGING — DX DG CHEST 1V
1 series · 1 of 1 positions shown · non-contrast
Comparison: [DATE].

CLINICAL DATA: Shortness of breath.

EXAM:
CHEST  1 VIEW

[chest ap]
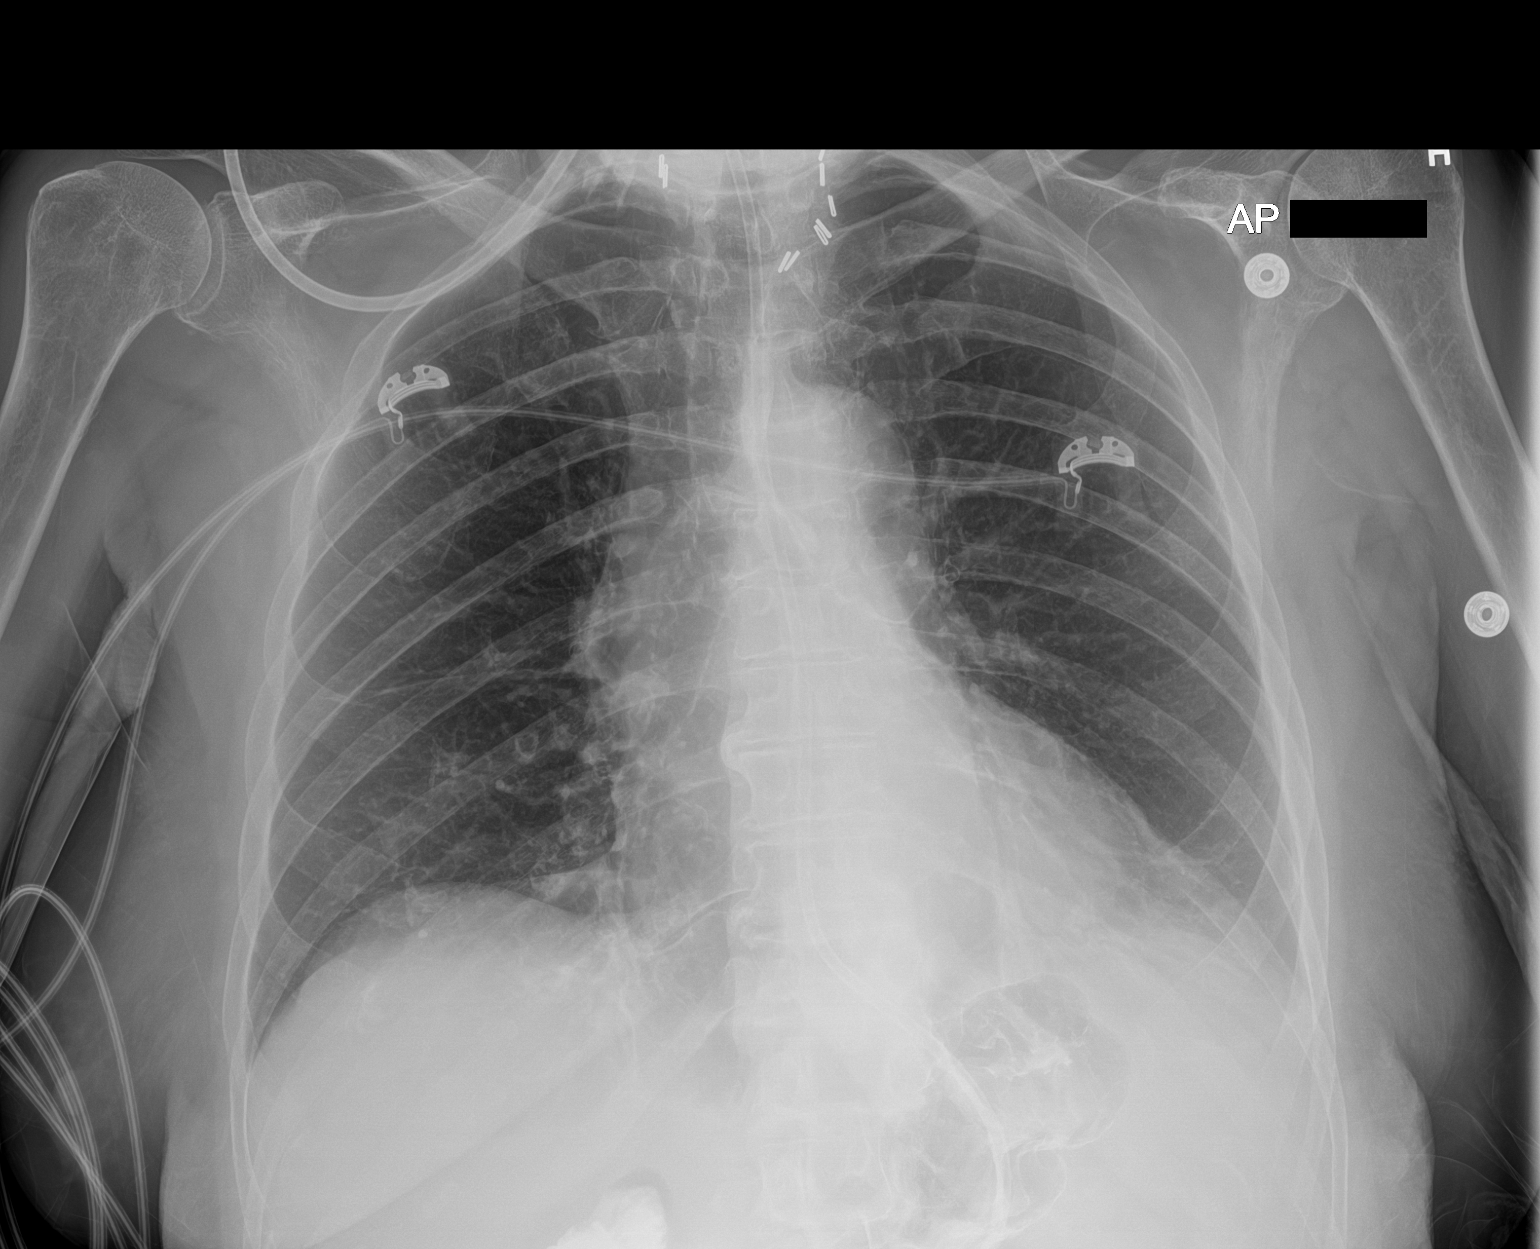

[1 of 1 positions shown; findings below may reference images not displayed]

FINDINGS: The heart size and mediastinal contours are within normal limits. No
pneumothorax or pleural effusion is noted. Stable minimal bibasilar
subsegmental atelectasis or scarring. Feeding tube is seen entering
stomach. The visualized skeletal structures are unremarkable.
IMPRESSION: Stable minimal bibasilar subsegmental atelectasis or scarring.

## 2019-06-24 MED ORDER — JEVITY 1.2 CAL PO LIQD
1000.0000 mL | ORAL | Status: DC
  Administered 2019-06-24 – 2019-06-25 (×2): 1000 mL
  Filled 2019-06-24 (×15): qty 1000

## 2019-06-24 MED ORDER — OSMOLITE 1.2 CAL PO LIQD: 1000.0000 mL | ORAL | Status: DC

## 2019-06-24 MED ORDER — PYRIDOSTIGMINE BROMIDE 60 MG PO TABS
30.0000 mg | ORAL_TABLET | ORAL | Status: DC
  Administered 2019-06-24 (×2): 30 mg via ORAL
  Filled 2019-06-24 (×5): qty 0.5

## 2019-06-24 MED ORDER — IMMUNE GLOBULIN (HUMAN) 10 GM/100ML IV SOLN
400.0000 mg/kg | Freq: Every day | INTRAVENOUS | Status: AC
  Administered 2019-06-24 – 2019-06-28 (×5): 25 g via INTRAVENOUS
  Filled 2019-06-24: qty 50
  Filled 2019-06-24 (×2): qty 200
  Filled 2019-06-24: qty 300
  Filled 2019-06-24: qty 200

## 2019-06-24 MED ORDER — PYRIDOSTIGMINE BROMIDE 60 MG PO TABS
60.0000 mg | ORAL_TABLET | ORAL | Status: DC
  Administered 2019-06-24 – 2019-06-26 (×8): 60 mg via ORAL
  Filled 2019-06-24 (×10): qty 1

## 2019-06-24 NOTE — Procedures (Signed)
Cortrak  Person Inserting Tube:  Audrey Cowan, RD Tube Type:  Cortrak - 43 inches Tube Location:  Left nare Initial Placement:  Stomach Secured by: Bridle Technique Used to Measure Tube Placement:  Documented cm marking at nare/ corner of mouth Cortrak Secured At:  67 cm Procedure Comments:  Cortrak Tube Team Note:  Consult received to place a Cortrak feeding tube.   No x-ray is required. RN may begin using tube.   If the tube becomes dislodged please keep the tube and contact the Cortrak team at [email or web address] ([email or web address]) for replacement.  If after hours and replacement cannot be delayed, place a NG tube and confirm placement with an abdominal x-ray.    Audrey Passey MS, RDN, LDN, CNSC RD Pager Number and Weekend/On-Call After Hours Pager Located in [place]

## 2019-06-24 NOTE — Progress Notes (Signed)
PROGRESS NOTE    Audrey Cowan  RSW:[id number] DOB: 06/13/1940 DOA: 06/22/2019 PCP: Carol Ada, MD   Brief Narrative:  64 YOF with hypertension, HLD, hypothyroidism, recent right ankle fracture in Jan , neuropathy, vitamin B12 deficiency, CKD III presented to ER with difficulty speaking and swallowing x 3 days. She presented to the ER where he was afebrile vitals stable, WBC 11.5K, potassium 3.1 COVID-19 negative, CVA was suspected and  CT/MRI brain was obtained that was unremarkable and neurology was consulted suggested possible neurovascular disease including myasthenia gravis patient was admitted for further management.  Subjective:  Getting NIF checked by neuro at bedside. No fever overnight. Overnight patient went to bathroom apparently had difficulty walking and was leaning to the right speech was more slurred reparations was called and code stroke initiated, seen by neurologist at bedside and canceled code stroke, no new finding.  Patient was a started on IVIG for myasthenia gravis.  Assessment & Plan:  Generalized weakness with dysarthria/dysphagia and ptosis: Suspecting myasthenia gravis.No evidence of acute stroke on CT and MRI. Neurology on board appreciate inputs. Monitor frequent NIF, FVC. hold Neurontin, follow-up anti-MU SK antibodies, ACR's antibodies. Cont Mestinon 30 mg every 4 hours-NGT-core track ordered this morning, speech following.  overnight started on empiric IVIG x5 days, close monitoring continue BiPAP if having difficulty respiration. This morning NIF -15 with poor coordination, vital capacity 1.2 L with good patient effort.  Monitor closely.  Discussed with neurologist Dr. Leonel Ramsay.  Leukocytosis: ?cause-likely reactive/in the setting of #1. Monitor.  Check UA/CXR .  Benign essential HTN-BP variable. high during night.  Home  AMLODIPINE, ACEI, LASIX on hold.  Continue as needed hydralazine.  Metabolic acidosis bicarb at 19.   Monitor  Hypokalemia-resolved  HLD-resume statins once able to take po  Hypothyroidism-tsh stable  H x of CKD (chronic kidney disease), stage III a- creat stable and clearance is > 60.monitor.  Had covid shots x2 , last dose on feb 6, per patient.  Body mass index is 23.13 kg/m.   DVT prophylaxis:lovenox Code Status:FULL Family Communication: plan of care discussed with patient at bedside.called to update son this am-no answer- will reattempt. Neuro had discussed with family overnight. Disposition Plan: Patient is from:home Anticipated Disposition: TBD Barriers to discharge or conditions that needs to be met prior to discharge: Patient admitted with worsening generalized weakness dysarthria/dysphagia ptosis and being treated for suspected myasthenia gravis.  Remains hospitalized  Consultants: Neurology  Procedures:see note Microbiology:see note  Diet Order            Diet NPO time specified  Diet effective now              Awaiting SLP eval  Medications: Scheduled Meds: Continuous Infusions:  Antimicrobials: Anti-infectives (From admission, onward)   None       Objective: Vitals: Today's Vitals   06/24/19 0107 06/24/19 0423 06/24/19 0512 06/24/19 0600  BP: 104/78 123/85 137/84 (!) 129/91  Pulse: (!) 101 (!) 102 (!) 101 91  Resp: 18 (!) 23 (!) 21 (!) 21  Temp:  97.8 F (36.6 C) 98.1 F (36.7 C) 97.8 F (36.6 C)  TempSrc:  Axillary Oral Oral  SpO2: 99% 99% 98% 98%  Weight:      Height:      PainSc:        Intake/Output Summary (Last 24 hours) at 06/24/2019 0722 Last data filed at 06/24/2019 0645 Gross per 24 hour  Intake 1185.59 ml  Output --  Net 1185.59 ml  Filed Weights   06/22/19 2046 06/23/19 0301  Weight: 63 kg 63 kg   Weight change:    Intake/Output from previous day: 03/16 0701 - 03/17 0700 In: 1185.6 [I.V.:1185.6] Out: -  Intake/Output this shift: No intake/output data recorded.  Examination:  General exam: Alert awake  oriented frail, on2l [place] HEENT:Oral mucosa moist, Ear/Nose WNL grossly,dentition normal. Respiratory system: bilaterally clear-but decreased respiratory effort and diminished breath sound,no use of accessory muscle, non tender. Cardiovascular system: S1 & S2 +, regular, No JVD. Gastrointestinal system: Abdomen soft, NT,ND, BS+. Nervous System:Alert, awake, moving extremities and grossly nonfocal Extremities: No edema, distal peripheral pulses palpable.  Skin: No rashes,no icterus. MSK: Normal muscle bulk,tone, power  Data Reviewed: I have personally reviewed following labs and imaging studies CBC: Recent Labs  Lab 06/22/19 2101 06/22/19 2110 06/24/19 0126  WBC 11.5*  --  19.8*  NEUTROABS 7.7  --   --   HGB 15.2* 12.2 15.1*  HCT 44.8 36.0 44.5  MCV 88.0  --  89.0  PLT 380  --  470   Basic Metabolic Panel: Recent Labs  Lab 06/22/19 2101 06/22/19 2110 06/24/19 0126  NA 137 140 141  K 3.9 3.1* 4.6  CL 101 106 108  CO2 19*  --  19*  GLUCOSE 98 85 146*  BUN _0 CREATININE 0.91 0.50 0.90  CALCIUM 9.2  --  9.7   GFR: Estimated Creatinine Clearance: 47.1 mL/min (by C-G formula based on SCr of 0.9 mg/dL). Liver Function Tests: Recent Labs  Lab 06/22/19 2101  AST 31  ALT 18  ALKPHOS 76  BILITOT 1.5*  PROT 7.3  ALBUMIN 4.3   No results for input(s): LIPASE, AMYLASE in the last 168 hours. No results for input(s): AMMONIA in the last 168 hours. Coagulation Profile: Recent Labs  Lab 06/22/19 2101  INR 1.0   Cardiac Enzymes: No results for input(s): CKTOTAL, CKMB, CKMBINDEX, TROPONINI in the last 168 hours. BNP (last 3 results) No results for input(s): PROBNP in the last 8760 hours. HbA1C: No results for input(s): HGBA1C in the last 72 hours. CBG: No results for input(s): GLUCAP in the last 168 hours. Lipid Profile: No results for input(s): CHOL, HDL, LDLCALC, TRIG, CHOLHDL, LDLDIRECT in the last 72 hours. Thyroid Function Tests: Recent Labs     06/22/19 2259  TSH 3.119   Anemia Panel: No results for input(s): VITAMINB12, FOLATE, FERRITIN, TIBC, IRON, RETICCTPCT in the last 72 hours. Sepsis Labs: No results for input(s): PROCALCITON, LATICACIDVEN in the last 168 hours.  Recent Results (from the past 240 hour(s))  SARS CORONAVIRUS 2 (TAT 6-24 HRS) Nasopharyngeal Nasopharyngeal Swab     Status: None   Collection Time: 06/22/19 10:59 PM   Specimen: Nasopharyngeal Swab  Result Value Ref Range Status   SARS Coronavirus 2 NEGATIVE NEGATIVE Final    Comment: (NOTE) SARS-CoV-2 target nucleic acids are NOT DETECTED. The SARS-CoV-2 RNA is generally detectable in upper and lower respiratory specimens during the acute phase of infection. Negative results do not preclude SARS-CoV-2 infection, do not rule out co-infections with other pathogens, and should not be used as the sole basis for treatment or other patient management decisions. Negative results must be combined with clinical observations, patient history, and epidemiological information. The expected result is Negative. Fact Sheet for Patients: SugarRoll.be Fact Sheet for Healthcare Providers: https://www.woods-mathews.com/ This test is not yet approved or cleared by the [place] FDA and  has been authorized for detection and/or diagnosis of SARS-CoV-2 by  FDA under an Emergency Use Authorization (EUA). This EUA will remain  in effect (meaning this test can be used) for the duration of the COVID-19 declaration under Section 56 4(b)(1) of the Act, 21 U.S.C. section 360bbb-3(b)(1), unless the authorization is terminated or revoked sooner. Performed at [facility], [place] [address], [place], [place] [address]       Radiology Studies: CT HEAD WO CONTRAST  Result Date: 06/22/2019 CLINICAL DATA:  Slurred speech and trouble swallowing EXAM: CT HEAD WITHOUT CONTRAST TECHNIQUE: Contiguous axial images were obtained from the base  of the skull through the vertex without intravenous contrast. COMPARISON:  None. FINDINGS: Brain: No acute territorial infarction, hemorrhage or intracranial mass. Mild atrophy. Moderate hypodensity in the white matter consistent with chronic small vessel ischemic change. Nonenlarged ventricles. Vascular: No hyperdense vessels.  Carotid vascular calcification. Skull: Normal. Negative for fracture or focal lesion. Sinuses/Orbits: No acute finding. Other: None IMPRESSION: 1. No CT evidence for acute intracranial abnormality. 2. Atrophy and chronic small vessel ischemic changes of the white matter Electronically Signed   By: Donavan Foil M.D.   On: 06/22/2019 22:29   MR BRAIN WO CONTRAST  Result Date: 06/22/2019 CLINICAL DATA:  Transient ischemic attack EXAM: MRI HEAD WITHOUT CONTRAST TECHNIQUE: Multiplanar, multiecho pulse sequences of the brain and surrounding structures were obtained without intravenous contrast. COMPARISON:  None. FINDINGS: Brain: No acute infarct, acute hemorrhage or extra-axial collection. Diffuse confluent hyperintense T2-weighted signal within the periventricular, deep and juxtacortical white matter, most commonly due to chronic ischemic microangiopathy. Normal volume of CSF spaces. Single chronic microhemorrhage in the left parietal lobe. Normal midline structures. Vascular: Normal flow voids. Skull and upper cervical spine: Normal marrow signal. Sinuses/Orbits: Negative. Other: None. IMPRESSION: 1. No acute intracranial abnormality. 2. 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 Electronically Signed   By: Ulyses Jarred M.D.   On: 06/22/2019 22:37   MR LUMBAR SPINE WO CONTRAST  Result Date: 06/22/2019 CLINICAL DATA:  Bilateral leg weakness and numbness. EXAM: MRI LUMBAR SPINE WITHOUT CONTRAST TECHNIQUE: Multiplanar, multisequence MR imaging of the lumbar spine was performed. No intravenous contrast was administered. COMPARISON:  None. FINDINGS: Segmentation:   Normal Alignment: Mild retrolisthesis T11-12, T12-L1, L1-2. 8 mm anterolisthesis L4-5. Vertebrae:  Normal bone marrow.  Negative for fracture or mass. Conus medullaris and cauda equina: Conus extends to the T12-L1 level. Conus and cauda equina appear normal. Paraspinal and other soft tissues: Negative for paraspinous mass or adenopathy. Disc levels: T12-L1: 4 mm retrolisthesis. Central small disc protrusion and mild facet degeneration. Negative for stenosis L1-2: 4 mm retrolisthesis. Mild disc and facet degeneration not causing significant spinal or foraminal stenosis L2-3: Mild disc and mild facet degeneration.  Negative for stenosis L3-4: Severe facet degeneration bilaterally. Severe spinal stenosis. Extruded disc fragment on the left with large disc fragment extending superiorly behind the left L3 vertebral body. Left L3 nerve root impingement. L4-5: Severe spinal stenosis. 8 mm anterolisthesis with severe facet degeneration bilaterally. Right foraminal disc protrusion with impingement of the right L4 nerve root due to disc protrusion and foraminal encroachment. L5-S1: Moderate facet degeneration. Moderate subarticular stenosis bilaterally with mild flattening of the S1 nerve roots bilaterally. 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 In addition, there is a large extruded disc fragment extending superiorly behind the L3 vertebral body on the left. Severe spinal stenosis L4-5 with 8 mm anterolisthesis and severe facet degeneration. Right  foraminal encroachment with right L4 nerve root compression. Electronically Signed   By: Franchot Gallo M.D.   On: 06/22/2019 08:39     LOS: 2 days   Time spent: More than 50% of that time was spent in counseling and/or coordination of care.  Antonieta Pert, MD [facility]  06/24/2019, 7:22 AM

## 2019-06-24 NOTE — Progress Notes (Addendum)
Initial Nutrition Assessment  DOCUMENTATION CODES:   Not applicable  INTERVENTION:  Begin TF via Cortrak: Jevity 1.2 at 30 ml/h, increase by 10 ml every 4 hours to goal rate of 60 ml/h to provide 1728 kcal, 80 gm protein, 1181 ml free water daily.  NUTRITION DIAGNOSIS:   Inadequate oral intake related to dysphagia as evidenced by NPO status.  Ongoing   GOAL:   Patient will meet greater than or equal to 90% of their needs  Progressing  MONITOR:   Diet advancement, PO intake, Labs  REASON FOR ASSESSMENT:   Malnutrition Screening Tool    ASSESSMENT:   79 yo female admitted with difficulty speaking and swallowing x 3 days. PMH includes HTN, HLD, hypothyroidism, recent R ankle fx, neuropathy, vitamin B-12 deficiency, CKD III.   IVIG being initiated today for suspected  myasthenia gravis.  S/P swallow evaluation with SLP 3/16. Patient remains NPO. MBS scheduled for today.   Cortrak feeding tube was placed this morning with tip in the stomach. Discussed with MD, okay for RD to order TF.  Labs reviewed. Potassium up to 4.6 today Medications reviewed.   NUTRITION - FOCUSED PHYSICAL EXAM:  unable to complete  Diet Order:   Diet Order            Diet NPO time specified  Diet effective now              EDUCATION NEEDS:   Not appropriate for education at this time  Skin:  Skin Assessment: Reviewed RN Assessment  Last BM:  no BM documented  Height:   Ht Readings from Last 1 Encounters:  06/23/19 5\' 5"  (1.651 m)    Weight:   Wt Readings from Last 1 Encounters:  06/23/19 63 kg    Ideal Body Weight:  56.8 kg  BMI:  Body mass index is 23.13 kg/m.  Estimated Nutritional Needs:   Kcal:  1650-1850  Protein:  80-95 gm  Fluid:  1.7-1.9 L    Molli Barrows, RD, LDN, CNSC Please refer to Amion for contact information.

## 2019-06-24 NOTE — Progress Notes (Signed)
Patient with 4 day history of progressive dysarthria, dysphagia as well as ptosis admitted to the hospital-myasthenia gravis suspected.  Patient was started on trial of Mestinon.  Tonight she went to the bathroom, apparently had difficulty walking and was leaning to the right.  Her speech was more slurred than baseline-rapid response was called and code stroke initiated. On assessment, no new neurological findings.  Canceled code stroke.   Recommended stat ABG.  Transferred to progressive unit for closer monitoring for respiratory status. Based on ABG results consider BiPAP/intubation. Her last attempt was -10 however gave poor effort.  Primary team notified.  Reviewed ABG results; no significant hypercarbia. Patient also has improved in repeat assessment.   Recommendations - close neurochecks  -Start emperic IVIG into 5 days ( discussed with son over the phone) - Q6h NIF, consider BiPAP if having difficulty with respiration  - Will need NG tube for feeds, medications  - continue mestinon once NG tube placed

## 2019-06-24 NOTE — Progress Notes (Signed)
VC: 1.5L with excellent patient effort.  NIF: -21 with much better coordination.

## 2019-06-24 NOTE — Progress Notes (Signed)
Pt became weak on ambulating from the restroom. Pt began to lean towards her right side and was complaining of shortness of breath. She kept leaning over stating that "it made it feel better". Pt words seemed more slurred than last night. Blood pressure was elevated at 180/82. RR were also elevated. Hydralazine IV was given, and a code stroke and rapid response was called. Code stroke was cancelled. Rapid response and Dr. Lorraine Lax came to the bedside. It was stated that no neuro findings were present. An ABG was done, and the patient was placed on a progressive care monitor. Will continue to monitor patient.

## 2019-06-24 NOTE — Progress Notes (Signed)
Subjective: Patient had some weakness while ambulating last night resulting in acute response by neurology.  She was started on IVIG  Exam: Vitals:   06/24/19 0600 06/24/19 0840  BP: (!) 129/91 (!) 147/85  Pulse: 91 99  Resp: (!) 21 (!) 22  Temp: 97.8 F (36.6 C) 98.7 F (37.1 C)  SpO2: 98% 99%   Gen: In bed, NAD Resp: non-labored breathing, no acute distress Abd: soft, nt  Neuro: MS: Awake, alert, oriented CN: She does not have ptosis today even with sustained upgaze Motor: Poor effort, 4/5 throughout Sensory: Intact light touch  Pertinent Labs: ACHR antibodies pending  Impression: 79 year old female with symptoms of myasthenia gravis which manifested following initiation of Neurontin.  Neurontin is known to worsen myasthenia gravis, and I suspect that she may have had some symptomatic disease worsened by gabapentin.  Currently, she appears to have had some response to Mestinon given that her ptosis appears improved.  This would further correlate with myasthenia gravis.  Her negative inspiratory force is quite low, but this is with poor coordination of the patient's part, I think that the vital capacity is more accurately reflecting her current respiratory status(20 mL/kg) and though borderline is still above where I would consider elective intubation.  She does still have significant pharyngeal dysfunction and I think that a core track is going to be needed for medication administration.  Recommendations: 1) Mestinon 30 mg every 4 hours, if tolerating, will consider increasing 2) IVIG, today is day 1 of 5 3) if vital capacity or NIF trend downward, we will need to consider transfer to the ICU for BiPAP or elective intubation 4) neurology will continue to follow   Roland Rack, MD [facility] [phone]  If 7pm- 7am, please page neurology on call as listed in [place].

## 2019-06-24 NOTE — Progress Notes (Signed)
VC: 1.2L with good patient effort.  NIF: -15 with poor coordination.   Neurology aware.

## 2019-06-24 NOTE — Progress Notes (Signed)
Modified Barium Swallow Progress Note  Patient Details  Name: Audrey Cowan MRN: [id number] Date of Birth: May 19, 1940  Today's Date: 06/24/2019  Modified Barium Swallow completed.  Full report located under Chart Review in the Imaging Section.  Brief recommendations include the following:  Clinical Impression  Pt demonstrates a moderate oropharyngeal dysphagia characterized by fatiguable weakness in the oral and pharyngeal musculature. Most sgnifiantly there is decreased base of tongue strength, reduced hyoid excursion, reduced epiglottic deflection and weak pharyngeal contraction. This results in significant vallecular residue, increasing with viscosity. Pt is best able to manage sips of thin liquids, is able to clear 80% of residue with 2-3 swallows. If trace penetration occurs, pt senses it and ejects it. Residue with puree was severe and required a liquid wash to clear with increased chance of aspiration. Positional strategies attempted without benefit. Pt could not achieve effortful swallow. Recommend pt continue with cortrak for primary source of nutrition, though she can also take sips of thin liquids for comfort and therapeutic activities. Hopeful for increased strength with medical management.    Swallow Evaluation Recommendations       SLP Diet Recommendations: Thin liquid   Liquid Administration via: Cup;Straw   Medication Administration: Via alternative means                        Audrey Cowan, Katherene Ponto 06/24/2019,12:35 PM

## 2019-06-24 NOTE — Progress Notes (Signed)
NIV -40 VC 1500+  Excellent effort and coordination

## 2019-06-24 NOTE — Progress Notes (Signed)
Neuro saw pt. Wants pt transferred to progressive care due to increased weakness. Order placed. See neuro note.  KJKG, NP [facility]

## 2019-06-25 DIAGNOSIS — G7000 Myasthenia gravis without (acute) exacerbation: Principal | ICD-10-CM

## 2019-06-25 LAB — GLUCOSE, CAPILLARY
Glucose-Capillary: 109 mg/dL — ABNORMAL HIGH (ref 70–99)
Glucose-Capillary: 112 mg/dL — ABNORMAL HIGH (ref 70–99)
Glucose-Capillary: 119 mg/dL — ABNORMAL HIGH (ref 70–99)
Glucose-Capillary: 124 mg/dL — ABNORMAL HIGH (ref 70–99)
Glucose-Capillary: 130 mg/dL — ABNORMAL HIGH (ref 70–99)
Glucose-Capillary: 131 mg/dL — ABNORMAL HIGH (ref 70–99)

## 2019-06-25 LAB — BASIC METABOLIC PANEL
Anion gap: 12 (ref 5–15)
BUN: 21 mg/dL (ref 8–23)
CO2: 24 mmol/L (ref 22–32)
Calcium: 9.4 mg/dL (ref 8.9–10.3)
Chloride: 107 mmol/L (ref 98–111)
Creatinine, Ser: 0.74 mg/dL (ref 0.44–1.00)
GFR calc Af Amer: >60 mL/min (ref >60)
GFR calc non Af Amer: >60 mL/min (ref >60)
Glucose, Bld: 133 mg/dL — ABNORMAL HIGH (ref 70–99)
Potassium: 3.2 mmol/L — ABNORMAL LOW (ref 3.5–5.1)
Sodium: 143 mmol/L (ref 135–145)

## 2019-06-25 MED ORDER — POTASSIUM CHLORIDE 20 MEQ/15ML (10%) PO SOLN
20.0000 meq | Freq: Once | ORAL | Status: AC
  Administered 2019-06-25: 20 meq via ORAL
  Filled 2019-06-25: qty 15

## 2019-06-25 MED ORDER — ATORVASTATIN CALCIUM 10 MG PO TABS
20.0000 mg | ORAL_TABLET | Freq: Every day | ORAL | Status: DC
  Administered 2019-06-25 – 2019-07-03 (×9): 20 mg via NASOGASTRIC
  Filled 2019-06-25 (×9): qty 2

## 2019-06-25 NOTE — Progress Notes (Signed)
NIV -11 VC 7L  Patient is sleeping and gave little effort.

## 2019-06-25 NOTE — Progress Notes (Signed)
RT NOTE: Patient performed NIF and VC with good patient effort with following results:  VC: 1.1L  NIF: -10 with poor coordination.   Vitals are stable. RT will continue to monitor.

## 2019-06-25 NOTE — Progress Notes (Signed)
RT NOTE:  Patient performed NIF and VC with good patient effort with following results:  VC: 1.3L NIF: -10 with poor coordination.   MD Leonel Ramsay in room when patient performed. RT will continue to monitor.

## 2019-06-25 NOTE — Progress Notes (Signed)
Physical Therapy Treatment Patient Details Name: Audrey Cowan MRN: [id number] DOB: 1940/10/27 Today's Date: 06/25/2019    History of Present Illness 79 year old female with fatigable dysphagia, ptosis, dysarthria following Neurontin administration.      PT Comments    Pt supine in bed requesting to move to bathroom.  Focus of treatment on ambulating in room with safe use of RW to and from bathroom.  Pt with poor safety.  Continue to recommend HHPT with 24 hr assistance at d/c.      Follow Up Recommendations  Home health PT;Supervision/Assistance - 24 hour     Equipment Recommendations  None recommended by PT    Recommendations for Other Services       Precautions / Restrictions Precautions Precautions: Fall Restrictions Weight Bearing Restrictions: No    Mobility  Bed Mobility Overal bed mobility: Needs Assistance Bed Mobility: Supine to Sit;Sit to Supine     Supine to sit: Min assist Sit to supine: Min assist   General bed mobility comments: Assistance for trunk elevation and lifting LEs back to bed.  Transfers Overall transfer level: Needs assistance Equipment used: Rolling walker (2 wheeled) Transfers: Sit to/from Stand Sit to Stand: Min assist         General transfer comment: Cues for hand placement to and from seated surface.  Pt holding to RW during stand to sit with poor eccentric load.  Ambulation/Gait Ambulation/Gait assistance: Min assist Gait Distance (Feet): 10 Feet(x2) Assistive device: Rolling walker (2 wheeled) Gait Pattern/deviations: Decreased stride length;Step-through pattern;Trunk flexed     General Gait Details: Cues for RW safety and upper trunk control.   Stairs             Wheelchair Mobility    Modified Rankin (Stroke Patients Only)       Balance Overall balance assessment: Needs assistance Sitting-balance support: No upper extremity supported;Feet supported Sitting balance-Leahy Scale: Fair        Standing balance-Leahy Scale: Poor                              Cognition Arousal/Alertness: Lethargic Behavior During Therapy: Flat affect Overall Cognitive Status: Within Functional Limits for tasks assessed                                        Exercises      General Comments        Pertinent Vitals/Pain Pain Assessment: Faces Faces Pain Scale: No hurt    Home Living                      Prior Function            PT Goals (current goals can now be found in the care plan section) Acute Rehab PT Goals Patient Stated Goal: to go home Potential to Achieve Goals: Good Progress towards PT goals: Progressing toward goals    Frequency    Min 3X/week      PT Plan Current plan remains appropriate    Co-evaluation              AM-PAC PT "6 Clicks" Mobility   Outcome Measure  Help needed turning from your back to your side while in a flat bed without using bedrails?: None Help needed moving from lying on your back to sitting on the side of  a flat bed without using bedrails?: None Help needed moving to and from a bed to a chair (including a wheelchair)?: A Little Help needed standing up from a chair using your arms (e.g., wheelchair or bedside chair)?: A Little Help needed to walk in hospital room?: A Little Help needed climbing 3-5 steps with a railing? : A Little 6 Click Score: 20    End of Session Equipment Utilized During Treatment: Gait belt Activity Tolerance: Patient limited by fatigue Patient left: in chair;with call bell/phone within reach;with family/visitor present Nurse Communication: Mobility status PT Visit Diagnosis: Unsteadiness on feet (R26.81);Muscle weakness (generalized) (M62.81)     Time: [id number] PT Time Calculation (min) (ACUTE ONLY): 30 min  Charges:  $Gait Training: 8-22 mins $Therapeutic Activity: 8-22 mins                     Erasmo Leventhal , PTA Acute Rehabilitation Services Pager  [phone] Office [phone]     Rashun Grattan Eli Hose 06/25/2019, 5:34 PM

## 2019-06-25 NOTE — Progress Notes (Signed)
PROGRESS NOTE    Audrey Cowan  ZHY:[id number] DOB: 1941/01/28 DOA: 06/22/2019 PCP: Carol Ada, MD   Brief Narrative:  79 YOF with hypertension, HLD, hypothyroidism, recent right ankle fracture in Jan , neuropathy, vitamin B12 deficiency, CKD III presented to ER with difficulty speaking and swallowing x 3 days. She presented to the ER where he was afebrile vitals stable, WBC 11.5K, potassium 3.1 COVID-19 negative, CVA was suspected and  CT/MRI brain was obtained that was unremarkable and neurology was consulted suggested possible neurovascular disease including myasthenia gravis patient was admitted for further management.  Subjective: Resting well, no complaints. On [place] o2. Cortrak+ on tube feed. Overnight NIV -11, VC 7 L but patient was sleeping and gave little effort SLP 3/17 recommended thin liquid. Afebrile with T-max 98.7, saturating well. Potassium low 3.took it this morning.  Assessment & Plan:  Generalized weakness with dysarthria/dysphagia and ptosis: Suspecting myasthenia gravis.No evidence of acute stroke on CT and MRI. Neurology on board appreciate inputs. Monitor frequent NIF, FVC. hold Neurontin, follow-up anti-MU SK antibodies, ACR's antibodies. Cont Mestinon 30 mg every 4 hours via core track, IVIG x5 days (started 3/17). Cont on close monitoring.  Leukocytosis: ?cause-likely reactive/in the setting of #1. Monitor.  Check UA. CXR unremarkable and patient is afebrile..  Benign essential HTN-BP remains stable.Home  AMLODIPINE, ACEI, LASIX on hold.  Continue as needed hydralazine.  Metabolic acidosis bicarb improved to 24  Hypokalemia-we will replete po.    HLD-resumed statins.  Hypothyroidism-tsh stable.cont Synthroid.  H x of CKD (chronic kidney disease), stage III a- creat stable and clearance is > 60.monitor.  Had covid shots x2 , last dose on feb 6, per patient.  Body mass index is 23.13 kg/m.   DVT prophylaxis:lovenox Code Status:FULL Family  Communication: plan of care discussed with patient at bedside. I had called and update son. Neuro had discussed with family overnight. Disposition Plan: Patient is from:home Anticipated Disposition: TBD Barriers to discharge or conditions that needs to be met prior to discharge: Patient admitted with worsening generalized weakness dysarthria/dysphagia ptosis and being treated for suspected myasthenia gravis.  Remains hospitalized  Consultants: Neurology  Procedures:see note Microbiology:see note  Diet Order            Diet full liquid Room service appropriate? Yes; Fluid consistency: Thin  Diet effective now              Awaiting SLP eval  Medications: Scheduled Meds: Continuous Infusions:  Antimicrobials: Anti-infectives (From admission, onward)   None       Objective: Vitals: Today's Vitals   06/25/19 0100 06/25/19 0433 06/25/19 0600 06/25/19 0700  BP: 123/71 139/81 132/78 135/81  Pulse: 78 83 84 85  Resp: 16 20 (!) 23 (!) 24  Temp:  98.2 F (36.8 C)    TempSrc:  Oral    SpO2: 95% 96% 98% 99%  Weight:      Height:      PainSc:        Intake/Output Summary (Last 24 hours) at 06/25/2019 0800 Last data filed at 06/25/2019 0600 Gross per 24 hour  Intake 1149 ml  Output 200 ml  Net 949 ml   Filed Weights   06/22/19 2046 06/23/19 0301  Weight: 63 kg 63 kg   Weight change:    Intake/Output from previous day: 03/17 0701 - 03/18 0700 In: 2952 [P.O.:30; NG/GT:1119] Out: 200 [Urine:200] Intake/Output this shift: No intake/output data recorded.  Examination: General exam:AAO,baseline,hard of hearing.  HEENT:Oral mucosa moist,Ear/Nose WNL grossly,dentition  normal. Respiratory system:Bilaterally clear-decreased RR,respiratory effort and diminished breath sound,no use of accessory muscle, non tender. Cardiovascular system: S1 & S2 +,regular, No JVD. Gastrointestinal system: Abdomen soft, NT,ND, BS+. Nervous System:Alert, awake, moving all her extremities with  fairly good strength Extremities:No edema, distal peripheral pulses palpable.  Skin:No rashes,no icterus. GEX:BMWUXL muscle bulk,tone, power  Data Reviewed: I have personally reviewed following labs and imaging studies CBC: Recent Labs  Lab 06/22/19 2101 06/22/19 2110 06/24/19 0126  WBC 11.5*  --  19.8*  NEUTROABS 7.7  --   --   HGB 15.2* 12.2 15.1*  HCT 44.8 36.0 44.5  MCV 88.0  --  89.0  PLT 380  --  244   Basic Metabolic Panel: Recent Labs  Lab 06/22/19 2101 06/22/19 2110 06/24/19 0126 06/25/19 0218  NA 137 140 141 143  K 3.9 3.1* 4.6 3.2*  CL 101 106 108 107  CO2 19*  --  19* 24  GLUCOSE 98 85 146* 133*  BUN 17 18 22 21   CREATININE 0.91 0.50 0.90 0.74  CALCIUM 9.2  --  9.7 9.4   GFR: Estimated Creatinine Clearance: 53 mL/min (by C-G formula based on SCr of 0.74 mg/dL). Liver Function Tests: Recent Labs  Lab 06/22/19 2101  AST 31  ALT 18  ALKPHOS 76  BILITOT 1.5*  PROT 7.3  ALBUMIN 4.3   No results for input(s): LIPASE, AMYLASE in the last 168 hours. No results for input(s): AMMONIA in the last 168 hours. Coagulation Profile: Recent Labs  Lab 06/22/19 2101  INR 1.0   Cardiac Enzymes: No results for input(s): CKTOTAL, CKMB, CKMBINDEX, TROPONINI in the last 168 hours. BNP (last 3 results) No results for input(s): PROBNP in the last 8760 hours. HbA1C: No results for input(s): HGBA1C in the last 72 hours. CBG: Recent Labs  Lab 06/24/19 1302 06/24/19 1746 06/24/19 2004 06/24/19 2353 06/25/19 0419  GLUCAP 95 101* 111* 106* 124*   Lipid Profile: No results for input(s): CHOL, HDL, LDLCALC, TRIG, CHOLHDL, LDLDIRECT in the last 72 hours. Thyroid Function Tests: Recent Labs    06/22/19 2259  TSH 3.119   Anemia Panel: No results for input(s): VITAMINB12, FOLATE, FERRITIN, TIBC, IRON, RETICCTPCT in the last 72 hours. Sepsis Labs: No results for input(s): PROCALCITON, LATICACIDVEN in the last 168 hours.  Recent Results (from the past 240  hour(s))  SARS CORONAVIRUS 2 (TAT 6-24 HRS) Nasopharyngeal Nasopharyngeal Swab     Status: None   Collection Time: 06/22/19 10:59 PM   Specimen: Nasopharyngeal Swab  Result Value Ref Range Status   SARS Coronavirus 2 NEGATIVE NEGATIVE Final    Comment: (NOTE) SARS-CoV-2 target nucleic acids are NOT DETECTED. The SARS-CoV-2 RNA is generally detectable in upper and lower respiratory specimens during the acute phase of infection. Negative results do not preclude SARS-CoV-2 infection, do not rule out co-infections with other pathogens, and should not be used as the sole basis for treatment or other patient management decisions. Negative results must be combined with clinical observations, patient history, and epidemiological information. The expected result is Negative. Fact Sheet for Patients: SugarRoll.be Fact Sheet for Healthcare Providers: https://www.woods-mathews.com/ This test is not yet approved or cleared by the [place] FDA and  has been authorized for detection and/or diagnosis of SARS-CoV-2 by FDA under an Emergency Use Authorization (EUA). This EUA will remain  in effect (meaning this test can be used) for the duration of the COVID-19 declaration under Section 56 4(b)(1) of the Act, 21 U.S.C. section 360bbb-3(b)(1), unless the  authorization is terminated or revoked sooner. Performed at [facility], [place] [address] [place], [place] [address]       Radiology Studies: DG Chest 1 View  Result Date: 06/24/2019 CLINICAL DATA:  Shortness of breath. EXAM: CHEST  1 VIEW COMPARISON:  May 05, 2013. FINDINGS: The heart size and mediastinal contours are within normal limits. No pneumothorax or pleural effusion is noted. Stable minimal bibasilar subsegmental atelectasis or scarring. Feeding tube is seen entering stomach. The visualized skeletal structures are unremarkable. IMPRESSION: Stable minimal bibasilar subsegmental  atelectasis or scarring. Electronically Signed   By: Marijo Conception M.D.   On: 06/24/2019 12:19   DG Swallowing Func-Speech Pathology  Result Date: 06/24/2019 Objective Swallowing Evaluation: Type of Study: MBS-Modified Barium Swallow Study  Patient Details Name: IVETH HEIDEMANN MRN: [id number] Date of Birth: 1940/11/03 Today's Date: 06/24/2019 Time: SLP Start Time (ACUTE ONLY): 1200 -SLP Stop Time (ACUTE ONLY): 1218 SLP Time Calculation (min) (ACUTE ONLY): 18 min Past Medical History: Past Medical History: Diagnosis Date . Chronic kidney disease   CKD . Hypercholesteremia  . Hypertension  . Hypothyroidism  . Trimalleolar fracture   RIGHT ANKLE Past Surgical History: Past Surgical History: Procedure Laterality Date . ORIF ANKLE FRACTURE Right 10/09/2018  Procedure: OPEN REDUCTION INTERNAL FIXATION (ORIF) Right ankle trimalleolar fracture;  Surgeon: Wylene Simmer, MD;  Location: [facility];  Service: Orthopedics;  Laterality: Right;  47mn HPI: LPLESHETTE TOMASINIis a 79y.o. female with PMH of hypertension, hyperlipidemia, hypothyroidism, right ankle fracture, neuropathy, CKD, fatigable dysphagia, ptosis, dysarthria following Neurontin administration. Suspected to have Myasthenia Gravis.  Subjective: cooperative Assessment / Plan / Recommendation CHL IP CLINICAL IMPRESSIONS 06/24/2019 Clinical Impression Pt demonstrates a moderate oropharyngeal dysphagia characterized by fatiguable weakness in the oral and pharyngeal musculature. Most sgnifiantly there is decreased base of tongue strength, reduced hyoid excursion, reduced epiglottic deflection and weak pharyngeal contraction. This results in significant vallecular residue, increasing with viscosity. Pt is best able to manage sips of thin liquids, is able to clear 80% of residue with 2-3 swallows. If trace penetration occurs, pt senses it and ejects it. Residue with puree was severe and required a liquid wash to clear with increased chance of aspiration.  Positional strategies attempted without benefit. Pt could not achieve effortful swallow. Recommend pt continue with cortrak for primary source of nutrition, though she can also take sips of thin liquids for comfort and therapeutic activities. Hopeful for increased strength with medical management.   SLP Visit Diagnosis Dysphagia, oropharyngeal phase (R13.12) Attention and concentration deficit following -- Frontal lobe and executive function deficit following -- Impact on safety and function Moderate aspiration risk   CHL IP TREATMENT RECOMMENDATION 06/24/2019 Treatment Recommendations Therapy as outlined in treatment plan below   Prognosis 06/24/2019 Prognosis for Safe Diet Advancement Good Barriers to Reach Goals -- Barriers/Prognosis Comment -- CHL IP DIET RECOMMENDATION 06/24/2019 SLP Diet Recommendations Thin liquid Liquid Administration via Cup;Straw Medication Administration Via alternative means Compensations -- Postural Changes --   No flowsheet data found.  CHL IP FOLLOW UP RECOMMENDATIONS 06/24/2019 Follow up Recommendations Other (comment)   CHL IP FREQUENCY AND DURATION 06/24/2019 Speech Therapy Frequency (ACUTE ONLY) min 2x/week Treatment Duration 2 weeks      CHL IP ORAL PHASE 06/24/2019 Oral Phase Impaired Oral - Pudding Teaspoon -- Oral - Pudding Cup -- Oral - Honey Teaspoon -- Oral - Honey Cup -- Oral - Nectar Teaspoon -- Oral - Nectar Cup -- Oral - Nectar Straw --  Oral - Thin Teaspoon Left pocketing in lateral sulci;Right pocketing in lateral sulci;Weak lingual manipulation Oral - Thin Cup Left pocketing in lateral sulci;Right pocketing in lateral sulci;Weak lingual manipulation Oral - Thin Straw Left pocketing in lateral sulci;Right pocketing in lateral sulci;Weak lingual manipulation Oral - Puree Left pocketing in lateral sulci;Right pocketing in lateral sulci;Weak lingual manipulation Oral - Mech Soft -- Oral - Regular -- Oral - Multi-Consistency -- Oral - Pill -- Oral Phase - Comment --  CHL IP  PHARYNGEAL PHASE 06/24/2019 Pharyngeal Phase Impaired Pharyngeal- Pudding Teaspoon -- Pharyngeal -- Pharyngeal- Pudding Cup -- Pharyngeal -- Pharyngeal- Honey Teaspoon -- Pharyngeal -- Pharyngeal- Honey Cup -- Pharyngeal -- Pharyngeal- Nectar Teaspoon -- Pharyngeal -- Pharyngeal- Nectar Cup Reduced epiglottic inversion;Reduced anterior laryngeal mobility;Reduced tongue base retraction;Penetration/Apiration after swallow;Pharyngeal residue - valleculae Pharyngeal Material enters airway, CONTACTS cords and then ejected out;Material enters airway, remains ABOVE vocal cords and not ejected out Pharyngeal- Nectar Straw -- Pharyngeal -- Pharyngeal- Thin Teaspoon Reduced epiglottic inversion;Reduced anterior laryngeal mobility;Reduced tongue base retraction;Penetration/Apiration after swallow;Pharyngeal residue - valleculae Pharyngeal Material enters airway, remains ABOVE vocal cords then ejected out;Material does not enter airway Pharyngeal- Thin Cup Reduced epiglottic inversion;Reduced anterior laryngeal mobility;Reduced tongue base retraction;Penetration/Apiration after swallow;Pharyngeal residue - valleculae Pharyngeal Material enters airway, remains ABOVE vocal cords then ejected out;Material does not enter airway Pharyngeal- Thin Straw Reduced epiglottic inversion;Reduced anterior laryngeal mobility;Reduced tongue base retraction;Penetration/Apiration after swallow;Pharyngeal residue - valleculae Pharyngeal Material enters airway, remains ABOVE vocal cords then ejected out;Material does not enter airway Pharyngeal- Puree Reduced epiglottic inversion;Reduced anterior laryngeal mobility;Reduced tongue base retraction;Pharyngeal residue - valleculae Pharyngeal Material does not enter airway Pharyngeal- Mechanical Soft -- Pharyngeal -- Pharyngeal- Regular -- Pharyngeal -- Pharyngeal- Multi-consistency -- Pharyngeal -- Pharyngeal- Pill -- Pharyngeal -- Pharyngeal Comment --  No flowsheet data found. DeBlois, Katherene Ponto 06/24/2019, 12:36 PM                LOS: 3 days   Time spent: More than 50% of that time was spent in counseling and/or coordination of care.  Antonieta Pert, MD [facility]  06/25/2019, 8:00 AM

## 2019-06-25 NOTE — Progress Notes (Signed)
RT coached pt while doing VC and NIF. Pts best NIF out of three was -18 this took much coaching but pt able to perform and follow instruction. VC best of three 1.5 L with good effort from pt. RT will continue to monitor.

## 2019-06-25 NOTE — Progress Notes (Signed)
Subjective: Increased dose of mestinon yesterday.   Exam: Vitals:   06/25/19 0838 06/25/19 0921  BP: (!) 144/59 135/85  Pulse: 94 97  Resp: (!) 22 18  Temp: 98.9 F (37.2 C)   SpO2: 99% 98%   Gen: In bed, NAD Resp: non-labored breathing, no acute distress Abd: soft, nt  Neuro: MS: Awake, alert, oriented CN: She does not have ptosis today even with sustained upgaze, still dysarthric Motor: Poor effort, 4/5 throughout Sensory: Intact light touch  Pertinent Labs: ACHR antibodies pending  Impression: 79 year old female with symptoms of dysphagia, dysarthria, ptosis  which manifested following initiation of Neurontin. These symptoms are most consistent with myasthenia gravis. Neurontin is known to worsen myasthenia gravis, and I suspect that she may have had subsymptomatic disease worsened by gabapentin.  Currently, she appears to have had some response to Mestinon given that her ptosis appears improved.  This would further correlate with myasthenia gravis.  Her negative inspiratory force is quite low, but this is with poor coordination of the patient's part, I think that the vital capacity is more accurately reflecting her current respiratory status(>20 mL/kg) and though borderline is still above where I would consider elective intubation.   Recommendations: 1) Mestinon 60 mg every 4 hours,  2) IVIG, today is day 2 of 5 3) neurology will continue to follow   Roland Rack, MD [facility] [phone]  If 7pm- 7am, please page neurology on call as listed in [facility].

## 2019-06-25 NOTE — Plan of Care (Signed)

## 2019-06-25 NOTE — Progress Notes (Signed)
  Speech Language Pathology Treatment: Dysphagia  Patient Details Name: Audrey Cowan MRN: [id number] DOB: 1941-01-25 Today's Date: 06/25/2019 Time: 1220-1230 SLP Time Calculation (min) (ACUTE ONLY): 10 min  Assessment / Plan / Recommendation Clinical Impression  Pt demonstrates tolerance of thin liquids, though motivation to drink is poor. Increased anterior spillage and decreased oral manipulation noted today. Pt reports she feels too weak to pick up her cup. I encouraged her to drink with a lidded cup from a straw, but she doesn't like straws. Oral intake expected to be minimal at this time. Will plan to f/u next week unless notified of significant change.   HPI HPI: Audrey Cowan is a 79 y.o. female with PMH of hypertension, hyperlipidemia, hypothyroidism, right ankle fracture, neuropathy, CKD, fatigable dysphagia, ptosis, dysarthria following Neurontin administration. Suspected to have Myasthenia Gravis.       SLP Plan  Continue with current plan of care       Recommendations  Diet recommendations: Thin liquid Liquids provided via: Cup;Straw Medication Administration: Via alternative means                Follow up Recommendations: Other (comment) SLP Visit Diagnosis: Dysphagia, oropharyngeal phase (R13.12) Plan: Continue with current plan of care       GO               Herbie Baltimore, [place] Pager [phone] Office [phone]  Lynann Beaver 06/25/2019, 1:39 PM

## 2019-06-26 LAB — CBC
HCT: 43.5 % (ref 36.0–46.0)
Hemoglobin: 14.7 g/dL (ref 12.0–15.0)
MCH: 30.1 pg (ref 26.0–34.0)
MCHC: 33.8 g/dL (ref 30.0–36.0)
MCV: 89 fL (ref 80.0–100.0)
Platelets: 333 10*3/uL (ref 150–400)
RBC: 4.89 MIL/uL (ref 3.87–5.11)
RDW: 13.4 % (ref 11.5–15.5)
WBC: 10.8 10*3/uL — ABNORMAL HIGH (ref 4.0–10.5)
nRBC: 0 % (ref 0.0–0.2)

## 2019-06-26 LAB — GLUCOSE, CAPILLARY
Glucose-Capillary: 121 mg/dL — ABNORMAL HIGH (ref 70–99)
Glucose-Capillary: 111 mg/dL — ABNORMAL HIGH (ref 70–99)
Glucose-Capillary: 112 mg/dL — ABNORMAL HIGH (ref 70–99)
Glucose-Capillary: 94 mg/dL (ref 70–99)
Glucose-Capillary: 109 mg/dL — ABNORMAL HIGH (ref 70–99)

## 2019-06-26 LAB — BASIC METABOLIC PANEL
Anion gap: 10 (ref 5–15)
BUN: 20 mg/dL (ref 8–23)
CO2: 24 mmol/L (ref 22–32)
Calcium: 9.6 mg/dL (ref 8.9–10.3)
Chloride: 107 mmol/L (ref 98–111)
Creatinine, Ser: 0.71 mg/dL (ref 0.44–1.00)
GFR calc Af Amer: >60 mL/min (ref >60)
GFR calc non Af Amer: >60 mL/min (ref >60)
Glucose, Bld: 139 mg/dL — ABNORMAL HIGH (ref 70–99)
Potassium: 3.6 mmol/L (ref 3.5–5.1)
Sodium: 141 mmol/L (ref 135–145)

## 2019-06-26 MED ORDER — PYRIDOSTIGMINE BROMIDE 60 MG PO TABS
90.0000 mg | ORAL_TABLET | ORAL | Status: DC
  Administered 2019-06-26 – 2019-07-01 (×26): 90 mg via ORAL
  Filled 2019-06-26 (×27): qty 1.5

## 2019-06-26 MED ORDER — AMLODIPINE BESYLATE 5 MG PO TABS
5.0000 mg | ORAL_TABLET | Freq: Every day | ORAL | Status: DC
  Administered 2019-06-26 – 2019-06-28 (×3): 5 mg via ORAL
  Filled 2019-06-26 (×4): qty 1

## 2019-06-26 MED ORDER — ASPIRIN EC 81 MG PO TBEC
81.0000 mg | DELAYED_RELEASE_TABLET | ORAL | Status: DC
  Administered 2019-06-26 – 2019-07-04 (×5): 81 mg via ORAL
  Filled 2019-06-26 (×7): qty 1

## 2019-06-26 NOTE — Progress Notes (Signed)
Physical Therapy Treatment Patient Details Name: Audrey Cowan MRN: [id number] DOB: 1940/08/17 Today's Date: 06/26/2019    History of Present Illness 79 year old female with fatigable dysphagia, ptosis, dysarthria following Neurontin administration.      PT Comments    Pt resting on room air and reports sitting up in recliner all of this morning, says her bottom is sore. Vitals WNL. Pt is making good slow but steady progressions in functional mobility. She transfers supine>sit with supervision and increased time, stands at [facility] with min guard, and ambulates 30' with min guard today. Ambulation limited in room due to lines hooked up to pt's room. Pt tolerates tx well. Tx time limited due to IV nurse showing up. Transfers back into bed due to sitting up all morning with light min A for foot clearance into bed. D/C plans remain appropriate. PT will continue to follow pt acutely.    Follow Up Recommendations  Home health PT;Supervision/Assistance - 24 hour     Equipment Recommendations  None recommended by PT       Precautions / Restrictions Precautions Precautions: Fall Restrictions Weight Bearing Restrictions: No    Mobility  Bed Mobility Overal bed mobility: Needs Assistance Bed Mobility: Supine to Sit;Sit to Supine     Supine to sit: Supervision Sit to supine: Min assist   General bed mobility comments: Supine>sit increased time and cuing, sit>supine light min A for L foot clearance  Transfers Overall transfer level: Needs assistance Equipment used: Rolling walker (2 wheeled) Transfers: Sit to/from Stand Sit to Stand: Min guard         General transfer comment: Cues for hand placement to and from seated surface.  Pt holding to RW during stand to sit with poor eccentric load.  Ambulation/Gait Ambulation/Gait assistance: Min guard Gait Distance (Feet): 30 Feet Assistive device: Rolling walker (2 wheeled) Gait Pattern/deviations: Decreased stride length;Step-through  pattern;Trunk flexed;Narrow base of support Gait velocity: decreased   General Gait Details: Min guard for safety and steadying, cues for turning for line management          Balance Overall balance assessment: Needs assistance Sitting-balance support: No upper extremity supported;Feet supported Sitting balance-Leahy Scale: Fair Sitting balance - Comments: Sits EOB without UE support with fair balance   Standing balance support: Bilateral upper extremity supported;During functional activity Standing balance-Leahy Scale: Poor Standing balance comment: relies on UE support                            Cognition Arousal/Alertness: Awake/alert Behavior During Therapy: WFL for tasks assessed/performed Overall Cognitive Status: Within Functional Limits for tasks assessed                                 General Comments: Pt is motivated for therapy. Slightly irritated her feeding tube keeps leaking. Nursing notified.      Exercises      General Comments General comments (skin integrity, edema, etc.): Pt is on room air. SpO2 within 95-100% throughout.       Pertinent Vitals/Pain Pain Assessment: No/denies pain           PT Goals (current goals can now be found in the care plan section) Acute Rehab PT Goals Patient Stated Goal: none stated PT Goal Formulation: With patient Time For Goal Achievement: 07/07/19 Potential to Achieve Goals: Good Progress towards PT goals: Progressing toward goals    Frequency  Min 3X/week      PT Plan Current plan remains appropriate       AM-PAC PT "6 Clicks" Mobility   Outcome Measure  Help needed turning from your back to your side while in a flat bed without using bedrails?: None Help needed moving from lying on your back to sitting on the side of a flat bed without using bedrails?: None Help needed moving to and from a bed to a chair (including a wheelchair)?: A Little Help needed standing up from a  chair using your arms (e.g., wheelchair or bedside chair)?: A Little Help needed to walk in hospital room?: A Little Help needed climbing 3-5 steps with a railing? : A Lot 6 Click Score: 19    End of Session Equipment Utilized During Treatment: Gait belt Activity Tolerance: Patient tolerated treatment well Patient left: in bed;with call bell/phone within reach;with nursing/sitter in room Nurse Communication: Mobility status PT Visit Diagnosis: Unsteadiness on feet (R26.81);Muscle weakness (generalized) (M62.81)     Time: [id number] PT Time Calculation (min) (ACUTE ONLY): 23 min  Charges:  $Gait Training: 8-22 mins $Therapeutic Activity: 8-22 mins                     Jodelle Green, PT, DPT Acute Rehabilitation Services Office [phone]    Jodelle Green 06/26/2019, 2:54 PM

## 2019-06-26 NOTE — Progress Notes (Signed)
PROGRESS NOTE    Audrey Cowan  YEL:[id number] DOB: 09-Oct-1940 DOA: 06/22/2019 PCP: Carol Ada, MD   Brief Narrative:  79 YOF with hypertension, HLD, hypothyroidism, recent right ankle fracture in Jan , neuropathy, vitamin B12 deficiency, CKD III presented to ER with difficulty speaking and swallowing x 3 days. She presented to the ER where he was afebrile vitals stable, WBC 11.5K, potassium 3.1 COVID-19 negative, CVA was suspected and  CT/MRI brain was obtained that was unremarkable and neurology was consulted suggested possible neurovascular disease including myasthenia gravis patient was admitted for further management. She was noted to have ptosis as well, and suspected Myasthenia Gravis worsened by Neurontin.  Started on Raoul with improvement in ptosis.   3/17- started on IVIG, with frequent NIF and FVC monitoring  Subjective: This morning.  Alert awake. Tube feed connection externally leaking- rn notified Overnight afebrile T-max 98.9.   Labs showed improved WBC count 10.8K.    Assessment & Plan:  Generalized weakness with dysarthria/dysphagia and ptosis: Suspecting myasthenia gravis worsened by Neurontin.Started on Rhodell with improvement in ptosis.  No evidence of acute stroke on CT and MRI. Neurology on board. Anti-MU SK antibodies, ACR's antibodies pending from 3/16.Cont Mestinon increased to 90 mg every 4 hours (started at 30) IVIG day # 3/5 (started 3/17).  VC stable but NIF is cooperation dependent as sometimes she does not have a good effort.  Monitor frequent NIF, FVC. hold Neurontin.  Leukocytosis: ?cause-likely reactive/in the setting of #1.  Chest x-ray unremarkable.  Afebrile and WBC improving.   Benign essential HTN-BP remains borderline controlled.  Intermittently high. Resume home  AMLODIPINE but cont to hold  ACEI, LASIX on hold.  Metabolic acidosis bicarb improved to 24  Hypokalemia-resolved.  HLD-cont statins.  Hypothyroidism-tsh stable.cont  Synthroid.  H x of CKD (chronic kidney disease), stage III a- creat stable and clearance is > 60.monitor.  Had covid shots x2 , last dose on feb 6, per patient.  Body mass index is 23.13 kg/m.   DVT prophylaxis:lovenox Code Status:FULL Family Communication: plan of care discussed with patient at bedside. I had called and updated son. Neuro had discussed with family too. Disposition Plan: Patient is from:home Anticipated Disposition: TBD Barriers to discharge or conditions that needs to be met prior to discharge: Patient admitted with worsening generalized weakness dysarthria/dysphagia ptosis and being treated for suspected myasthenia gravis-on IVIG last dose 3/21.  Anticipate discharge home w hhpt after neurology signs off and if clinically improves on completion of IVIG.  Consultants: Neurology  Procedures:see note Microbiology:see note  Diet Order            Diet full liquid Room service appropriate? Yes; Fluid consistency: Thin  Diet effective now              Awaiting SLP eval  Medications: Scheduled Meds: Continuous Infusions:  Antimicrobials: Anti-infectives (From admission, onward)   None       Objective: Vitals: Today's Vitals   06/25/19 2100 06/26/19 0400 06/26/19 0813 06/26/19 0915  BP: (!) 157/92 (!) 151/89 115/83   Pulse: 98 95 84 100  Resp: (!) 25 20 17 16   Temp:  97.9 F (36.6 C) 97.7 F (36.5 C)   TempSrc: Oral Oral Oral   SpO2: 96% 99% 96% 97%  Weight:      Height:      PainSc: 0-No pain  0-No pain    No intake or output data in the 24 hours ending 06/26/19 1101 Filed Weights   06/22/19  2046 06/23/19 0301  Weight: 63 kg 63 kg   Weight change:    Intake/Output from previous day: No intake/output data recorded. Intake/Output this shift: No intake/output data recorded.  Examination: General exam:AAOx3, hard of hearing, not in acute distress.  NG tube present. HEENT:Oral mucosa moist,Ear/Nose WNL grossly,dentition  normal. Respiratory system:Bilaterally clear no use of accessory muscles, no wheezing or crackles.   Cardiovascular system: S1 & S2 +,regular, No JVD. Gastrointestinal system: Abdomen soft, NT,ND, BS+. Nervous System:Alert, awake, moving all her extremities with fairly good strength Extremities:No edema, distal peripheral pulses palpable.  Skin:No rashes,no icterus. FOY:DXAJOI muscle bulk,tone, power  Data Reviewed: I have personally reviewed following labs and imaging studies CBC: Recent Labs  Lab 06/22/19 2101 06/22/19 2110 06/24/19 0126 06/26/19 0231  WBC 11.5*  --  19.8* 10.8*  NEUTROABS 7.7  --   --   --   HGB 15.2* 12.2 15.1* 14.7  HCT 44.8 36.0 44.5 43.5  MCV 88.0  --  89.0 89.0  PLT 380  --  393 786   Basic Metabolic Panel: Recent Labs  Lab 06/22/19 2101 06/22/19 2110 06/24/19 0126 06/25/19 0218 06/26/19 0231  NA 137 140 141 143 141  K 3.9 3.1* 4.6 3.2* 3.6  CL 101 106 108 107 107  CO2 19*  --  19* 24 24  GLUCOSE 98 85 146* 133* 139*  BUN 17 18 22 21 20   CREATININE 0.91 0.50 0.90 0.74 0.71  CALCIUM 9.2  --  9.7 9.4 9.6   GFR: Estimated Creatinine Clearance: 53 mL/min (by C-G formula based on SCr of 0.71 mg/dL). Liver Function Tests: Recent Labs  Lab 06/22/19 2101  AST 31  ALT 18  ALKPHOS 76  BILITOT 1.5*  PROT 7.3  ALBUMIN 4.3   No results for input(s): LIPASE, AMYLASE in the last 168 hours. No results for input(s): AMMONIA in the last 168 hours. Coagulation Profile: Recent Labs  Lab 06/22/19 2101  INR 1.0   Cardiac Enzymes: No results for input(s): CKTOTAL, CKMB, CKMBINDEX, TROPONINI in the last 168 hours. BNP (last 3 results) No results for input(s): PROBNP in the last 8760 hours. HbA1C: No results for input(s): HGBA1C in the last 72 hours. CBG: Recent Labs  Lab 06/25/19 1237 06/25/19 1646 06/25/19 1942 06/25/19 2341 06/26/19 0812  GLUCAP 109* 119* 112* 130* 121*   Lipid Profile: No results for input(s): CHOL, HDL, LDLCALC,  TRIG, CHOLHDL, LDLDIRECT in the last 72 hours. Thyroid Function Tests: No results for input(s): TSH, T4TOTAL, FREET4, T3FREE, THYROIDAB in the last 72 hours. Anemia Panel: No results for input(s): VITAMINB12, FOLATE, FERRITIN, TIBC, IRON, RETICCTPCT in the last 72 hours. Sepsis Labs: No results for input(s): PROCALCITON, LATICACIDVEN in the last 168 hours.  Recent Results (from the past 240 hour(s))  SARS CORONAVIRUS 2 (TAT 6-24 HRS) Nasopharyngeal Nasopharyngeal Swab     Status: None   Collection Time: 06/22/19 10:59 PM   Specimen: Nasopharyngeal Swab  Result Value Ref Range Status   SARS Coronavirus 2 NEGATIVE NEGATIVE Final    Comment: (NOTE) SARS-CoV-2 target nucleic acids are NOT DETECTED. The SARS-CoV-2 RNA is generally detectable in upper and lower respiratory specimens during the acute phase of infection. Negative results do not preclude SARS-CoV-2 infection, do not rule out co-infections with other pathogens, and should not be used as the sole basis for treatment or other patient management decisions. Negative results must be combined with clinical observations, patient history, and epidemiological information. The expected result is Negative. Fact Sheet for Patients:  SugarRoll.be Fact Sheet for Healthcare Providers: https://www.woods-mathews.com/ This test is not yet approved or cleared by the [place] FDA and  has been authorized for detection and/or diagnosis of SARS-CoV-2 by FDA under an Emergency Use Authorization (EUA). This EUA will remain  in effect (meaning this test can be used) for the duration of the COVID-19 declaration under Section 56 4(b)(1) of the Act, 21 U.S.C. section 360bbb-3(b)(1), unless the authorization is terminated or revoked sooner. Performed at [facility], [place] [address], [place], [place] [address]       Radiology Studies: DG Chest 1 View  Result Date: 06/24/2019 CLINICAL DATA:   Shortness of breath. EXAM: CHEST  1 VIEW COMPARISON:  May 05, 2013. FINDINGS: The heart size and mediastinal contours are within normal limits. No pneumothorax or pleural effusion is noted. Stable minimal bibasilar subsegmental atelectasis or scarring. Feeding tube is seen entering stomach. The visualized skeletal structures are unremarkable. IMPRESSION: Stable minimal bibasilar subsegmental atelectasis or scarring. Electronically Signed   By: Marijo Conception M.D.   On: 06/24/2019 12:19   DG Swallowing Func-Speech Pathology  Result Date: 06/24/2019 Objective Swallowing Evaluation: Type of Study: MBS-Modified Barium Swallow Study  Patient Details Name: Audrey Cowan MRN: [id number] Date of Birth: 1940-09-02 Today's Date: 06/24/2019 Time: SLP Start Time (ACUTE ONLY): 1200 -SLP Stop Time (ACUTE ONLY): 1218 SLP Time Calculation (min) (ACUTE ONLY): 18 min Past Medical History: Past Medical History: Diagnosis Date  Chronic kidney disease   CKD  Hypercholesteremia   Hypertension   Hypothyroidism   Trimalleolar fracture   RIGHT ANKLE Past Surgical History: Past Surgical History: Procedure Laterality Date  ORIF ANKLE FRACTURE Right 10/09/2018  Procedure: OPEN REDUCTION INTERNAL FIXATION (ORIF) Right ankle trimalleolar fracture;  Surgeon: Wylene Simmer, MD;  Location: [facility];  Service: Orthopedics;  Laterality: Right;  41mn HPI: LROSHAWNDA PECORAis a 79y.o. female with PMH of hypertension, hyperlipidemia, hypothyroidism, right ankle fracture, neuropathy, CKD, fatigable dysphagia, ptosis, dysarthria following Neurontin administration. Suspected to have Myasthenia Gravis.  Subjective: cooperative Assessment / Plan / Recommendation CHL IP CLINICAL IMPRESSIONS 06/24/2019 Clinical Impression Pt demonstrates a moderate oropharyngeal dysphagia characterized by fatiguable weakness in the oral and pharyngeal musculature. Most sgnifiantly there is decreased base of tongue strength, reduced hyoid  excursion, reduced epiglottic deflection and weak pharyngeal contraction. This results in significant vallecular residue, increasing with viscosity. Pt is best able to manage sips of thin liquids, is able to clear 80% of residue with 2-3 swallows. If trace penetration occurs, pt senses it and ejects it. Residue with puree was severe and required a liquid wash to clear with increased chance of aspiration. Positional strategies attempted without benefit. Pt could not achieve effortful swallow. Recommend pt continue with cortrak for primary source of nutrition, though she can also take sips of thin liquids for comfort and therapeutic activities. Hopeful for increased strength with medical management.   SLP Visit Diagnosis Dysphagia, oropharyngeal phase (R13.12) Attention and concentration deficit following -- Frontal lobe and executive function deficit following -- Impact on safety and function Moderate aspiration risk   CHL IP TREATMENT RECOMMENDATION 06/24/2019 Treatment Recommendations Therapy as outlined in treatment plan below   Prognosis 06/24/2019 Prognosis for Safe Diet Advancement Good Barriers to Reach Goals -- Barriers/Prognosis Comment -- CHL IP DIET RECOMMENDATION 06/24/2019 SLP Diet Recommendations Thin liquid Liquid Administration via Cup;Straw Medication Administration Via alternative means Compensations -- Postural Changes --   No flowsheet data found.  CHL IP FOLLOW UP RECOMMENDATIONS 06/24/2019  Follow up Recommendations Other (comment)   CHL IP FREQUENCY AND DURATION 06/24/2019 Speech Therapy Frequency (ACUTE ONLY) min 2x/week Treatment Duration 2 weeks      CHL IP ORAL PHASE 06/24/2019 Oral Phase Impaired Oral - Pudding Teaspoon -- Oral - Pudding Cup -- Oral - Honey Teaspoon -- Oral - Honey Cup -- Oral - Nectar Teaspoon -- Oral - Nectar Cup -- Oral - Nectar Straw -- Oral - Thin Teaspoon Left pocketing in lateral sulci;Right pocketing in lateral sulci;Weak lingual manipulation Oral - Thin Cup Left  pocketing in lateral sulci;Right pocketing in lateral sulci;Weak lingual manipulation Oral - Thin Straw Left pocketing in lateral sulci;Right pocketing in lateral sulci;Weak lingual manipulation Oral - Puree Left pocketing in lateral sulci;Right pocketing in lateral sulci;Weak lingual manipulation Oral - Mech Soft -- Oral - Regular -- Oral - Multi-Consistency -- Oral - Pill -- Oral Phase - Comment --  CHL IP PHARYNGEAL PHASE 06/24/2019 Pharyngeal Phase Impaired Pharyngeal- Pudding Teaspoon -- Pharyngeal -- Pharyngeal- Pudding Cup -- Pharyngeal -- Pharyngeal- Honey Teaspoon -- Pharyngeal -- Pharyngeal- Honey Cup -- Pharyngeal -- Pharyngeal- Nectar Teaspoon -- Pharyngeal -- Pharyngeal- Nectar Cup Reduced epiglottic inversion;Reduced anterior laryngeal mobility;Reduced tongue base retraction;Penetration/Apiration after swallow;Pharyngeal residue - valleculae Pharyngeal Material enters airway, CONTACTS cords and then ejected out;Material enters airway, remains ABOVE vocal cords and not ejected out Pharyngeal- Nectar Straw -- Pharyngeal -- Pharyngeal- Thin Teaspoon Reduced epiglottic inversion;Reduced anterior laryngeal mobility;Reduced tongue base retraction;Penetration/Apiration after swallow;Pharyngeal residue - valleculae Pharyngeal Material enters airway, remains ABOVE vocal cords then ejected out;Material does not enter airway Pharyngeal- Thin Cup Reduced epiglottic inversion;Reduced anterior laryngeal mobility;Reduced tongue base retraction;Penetration/Apiration after swallow;Pharyngeal residue - valleculae Pharyngeal Material enters airway, remains ABOVE vocal cords then ejected out;Material does not enter airway Pharyngeal- Thin Straw Reduced epiglottic inversion;Reduced anterior laryngeal mobility;Reduced tongue base retraction;Penetration/Apiration after swallow;Pharyngeal residue - valleculae Pharyngeal Material enters airway, remains ABOVE vocal cords then ejected out;Material does not enter airway  Pharyngeal- Puree Reduced epiglottic inversion;Reduced anterior laryngeal mobility;Reduced tongue base retraction;Pharyngeal residue - valleculae Pharyngeal Material does not enter airway Pharyngeal- Mechanical Soft -- Pharyngeal -- Pharyngeal- Regular -- Pharyngeal -- Pharyngeal- Multi-consistency -- Pharyngeal -- Pharyngeal- Pill -- Pharyngeal -- Pharyngeal Comment --  No flowsheet data found. DeBlois, Katherene Ponto 06/24/2019, 12:36 PM                LOS: 4 days   Time spent: More than 50% of that time was spent in counseling and/or coordination of care.  Antonieta Pert, MD [facility]  06/26/2019, 11:01 AM

## 2019-06-26 NOTE — Progress Notes (Signed)
Patient performed NIF and VC with very good patient effort x 3 on each task. Pts best NIF -25 and VC 1.4 L. Pt tolerated well, RT will continue to monitor.

## 2019-06-26 NOTE — Progress Notes (Signed)
Subjective: VC stable, NIF I think still cooperation dependant.   Exam: Vitals:   06/26/19 0813 06/26/19 0915  BP: 115/83   Pulse: 84 100  Resp: 17 16  Temp: 97.7 F (36.5 C)   SpO2: 96% 97%   Gen: In bed, NAD Resp: non-labored breathing, no acute distress Abd: soft, nt  Neuro: MS: Awake, alert, oriented CN: She has R > L ptosis with sustained upgaze aroun 90 seconds, still with fatiguable dysarthria Motor: She has 4/5 strength in arms and legs, with the exception of 3-/5 shoulder abduction, and 4-/5 neck flexion(slightly improved.  Sensory: Intact light touch  Pertinent Labs: ACHR antibodies pending  Impression: 79 year old female with symptoms of dysphagia, dysarthria, ptosis  which manifested following initiation of Neurontin. These symptoms are most consistent with myasthenia gravis. Neurontin is known to worsen myasthenia gravis, and I suspect that she may have had subsymptomatic disease worsened by gabapentin.  Her negative inspiratory force is quite low, but this is with poor coordination of the patient's part, I think that the vital capacity is more accurately reflecting her current respiratory status(>20 mL/kg) and though borderline is still above where I would consider elective intubation.  She will need steroids, but I would favor beginning to see a response to IVIG prior to initiating.   Recommendations: 1) Increase Mestinon to 90 mg every 4 hours.  2) IVIG, today is day 3 of 5 3) neurology will continue to follow   Roland Rack, MD [facility] [phone]  If 7pm- 7am, please page neurology on call as listed in [place].

## 2019-06-26 NOTE — Progress Notes (Signed)
RT NOTE: Patient performed NIF and VC with great patient effort and obtained the following results:  VC: 1.2L NIF: -30 with great patient effort  Vitals are stable. RT will continue to monitor.

## 2019-06-26 NOTE — Progress Notes (Signed)
RT NOTE: Patient performed NIF and VC with great patient effort and obtained the following results:  VC: 1.3L NIF: -26  Vitals are stable. RT will continue to monitor.

## 2019-06-26 NOTE — Progress Notes (Signed)
OVERNIGHT EVENTS:   Pt. Up to [facility] this morning. Upon returning to bed, patient began having an increased resp rate and some anxiety and tri-podding on side of bed. O2 sats 93-94% on RA. However, due to work of breathing, placed patient on 2L [place] with relief of symptoms.

## 2019-06-27 DIAGNOSIS — N183 Chronic kidney disease, stage 3 unspecified: Secondary | ICD-10-CM

## 2019-06-27 DIAGNOSIS — I10 Essential (primary) hypertension: Secondary | ICD-10-CM

## 2019-06-27 DIAGNOSIS — R471 Dysarthria and anarthria: Secondary | ICD-10-CM

## 2019-06-27 DIAGNOSIS — D72829 Elevated white blood cell count, unspecified: Secondary | ICD-10-CM

## 2019-06-27 DIAGNOSIS — E039 Hypothyroidism, unspecified: Secondary | ICD-10-CM

## 2019-06-27 LAB — CBC
HCT: 41.5 % (ref 36.0–46.0)
Hemoglobin: 14 g/dL (ref 12.0–15.0)
MCH: 29.9 pg (ref 26.0–34.0)
MCHC: 33.7 g/dL (ref 30.0–36.0)
MCV: 88.7 fL (ref 80.0–100.0)
Platelets: 286 10*3/uL (ref 150–400)
RBC: 4.68 MIL/uL (ref 3.87–5.11)
RDW: 13.5 % (ref 11.5–15.5)
WBC: 8.9 10*3/uL (ref 4.0–10.5)
nRBC: 0 % (ref 0.0–0.2)

## 2019-06-27 LAB — GLUCOSE, CAPILLARY
Glucose-Capillary: 107 mg/dL — ABNORMAL HIGH (ref 70–99)
Glucose-Capillary: 114 mg/dL — ABNORMAL HIGH (ref 70–99)
Glucose-Capillary: 114 mg/dL — ABNORMAL HIGH (ref 70–99)
Glucose-Capillary: 136 mg/dL — ABNORMAL HIGH (ref 70–99)
Glucose-Capillary: 86 mg/dL (ref 70–99)
Glucose-Capillary: 99 mg/dL (ref 70–99)

## 2019-06-27 NOTE — Progress Notes (Signed)
NIF -25 cmH2O (X3) with good effort VC 1.1 L/M with good effort.

## 2019-06-27 NOTE — Progress Notes (Signed)
PROGRESS NOTE    Audrey Cowan  [id number] DOB: 05/30/1940 DOA: 06/22/2019 PCP: Carol Ada, MD   Brief Narrative:   79 YOF with hypertension, HLD, hypothyroidism, recent right ankle fracture in Jan , neuropathy, vitamin B12 deficiency, CKD III presented to ER with difficulty speaking and swallowing x 3 days. She presented to the ER where he was afebrile vitals stable, WBC 11.5K, potassium 3.1 COVID-19 negative, CVA was suspected and  CT/MRI brain was obtained that was unremarkable and neurology was consulted suggested possible neurovascular disease including myasthenia gravis patient was admitted for further management. She was noted to have ptosis as well, and suspected Myasthenia Gravis worsened by Neurontin.  Started on [place] with improvement in ptosis.   3/17- started on IVIG, with frequent NIF and FVC monitoring   Assessment & Plan:   Principal Problem:   Generalized weakness Active Problems:   Benign essential HTN   Hypothyroidism   Hyperlipidemia   CKD (chronic kidney disease), stage III   Leucocytosis   Hypokalemia   Generalized weakness with dysarthria/dysphagia and ptosis: Suspecting myasthenia gravis worsened by Neurontin.Started on [place] with improvement in ptosis.  No evidence of acute stroke on CT and MRI. Neurology on board. Anti-MU SK antibodies, ACR's antibodies pending from 3/16.Cont Mestinon increased to 90 mg every 4 hours (started at 30) IVIG day # 4/5 (started 3/17).  VC stable but NIF is cooperation dependent as sometimes she does not have a good effort.  Monitor frequent NIF-most recent -25, FVC. hold Neurontin.  Leukocytosis: ?cause-likely reactive/in the setting of #1.  Chest x-ray unremarkable.  Afebrile and WBC normal at 8.9  Benign essential HTN-BP remains borderline controlled.  Intermittently high. Resume home  AMLODIPINE but cont to hold  ACEI, LASIX on hold.  Metabolic acidosis bicarb improved to  24  Hypokalemia-resolved.  HLD-cont statins.  Hypothyroidism-tsh stable.cont Synthroid.  H x of CKD (chronic kidney disease), stage III a- creat stable and clearance is > 60.monitor.  Had covid shots x2 , last dose on feb 6, per patient.  Body mass index is 23.13 kg/m.   DVT prophylaxis: Lovenox SQ  Code Status: Full code    Code Status Orders  (From admission, onward)         Start     Ordered   06/23/19 1045  Full code  Continuous     06/23/19 1045        Code Status History    This patient has a current code status but no historical code status.   Advance Care Planning Activity     Family Communication: None today Disposition Plan:   Patient is from home, unstable for discharge currently still with generalized weakness dysarthria dysphagia and receiving IVIG Consults called: Neurology Admission status: Inpatient   Consultants:   As above  Procedures:  DG Chest 1 View  Result Date: 06/24/2019 CLINICAL DATA:  Shortness of breath. EXAM: CHEST  1 VIEW COMPARISON:  May 05, 2013. FINDINGS: The heart size and mediastinal contours are within normal limits. No pneumothorax or pleural effusion is noted. Stable minimal bibasilar subsegmental atelectasis or scarring. Feeding tube is seen entering stomach. The visualized skeletal structures are unremarkable. IMPRESSION: Stable minimal bibasilar subsegmental atelectasis or scarring. Electronically Signed   By: Marijo Conception M.D.   On: 06/24/2019 12:19   CT HEAD WO CONTRAST  Result Date: 06/22/2019 CLINICAL DATA:  Slurred speech and trouble swallowing EXAM: CT HEAD WITHOUT CONTRAST TECHNIQUE: Contiguous axial images were obtained from the base of the  skull through the vertex without intravenous contrast. COMPARISON:  None. FINDINGS: Brain: No acute territorial infarction, hemorrhage or intracranial mass. Mild atrophy. Moderate hypodensity in the white matter consistent with chronic small vessel ischemic change.  Nonenlarged ventricles. Vascular: No hyperdense vessels.  Carotid vascular calcification. Skull: Normal. Negative for fracture or focal lesion. Sinuses/Orbits: No acute finding. Other: None IMPRESSION: 1. No CT evidence for acute intracranial abnormality. 2. Atrophy and chronic small vessel ischemic changes of the white matter Electronically Signed   By: Donavan Foil M.D.   On: 06/22/2019 22:29   MR BRAIN WO CONTRAST  Result Date: 06/22/2019 CLINICAL DATA:  Transient ischemic attack EXAM: MRI HEAD WITHOUT CONTRAST TECHNIQUE: Multiplanar, multiecho pulse sequences of the brain and surrounding structures were obtained without intravenous contrast. COMPARISON:  None. FINDINGS: Brain: No acute infarct, acute hemorrhage or extra-axial collection. Diffuse confluent hyperintense T2-weighted signal within the periventricular, deep and juxtacortical white matter, most commonly due to chronic ischemic microangiopathy. Normal volume of CSF spaces. Single chronic microhemorrhage in the left parietal lobe. Normal midline structures. Vascular: Normal flow voids. Skull and upper cervical spine: Normal marrow signal. Sinuses/Orbits: Negative. Other: None. IMPRESSION: 1. No acute intracranial abnormality. 2. 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 Electronically Signed   By: Ulyses Jarred M.D.   On: 06/22/2019 22:37   MR LUMBAR SPINE WO CONTRAST  Result Date: 06/22/2019 CLINICAL DATA:  Bilateral leg weakness and numbness. EXAM: MRI LUMBAR SPINE WITHOUT CONTRAST TECHNIQUE: Multiplanar, multisequence MR imaging of the lumbar spine was performed. No intravenous contrast was administered. COMPARISON:  None. FINDINGS: Segmentation:  Normal Alignment: Mild retrolisthesis T11-12, T12-L1, L1-2. 8 mm anterolisthesis L4-5. Vertebrae:  Normal bone marrow.  Negative for fracture or mass. Conus medullaris and cauda equina: Conus extends to the T12-L1 level. Conus and cauda equina appear normal. Paraspinal and  other soft tissues: Negative for paraspinous mass or adenopathy. Disc levels: T12-L1: 4 mm retrolisthesis. Central small disc protrusion and mild facet degeneration. Negative for stenosis L1-2: 4 mm retrolisthesis. Mild disc and facet degeneration not causing significant spinal or foraminal stenosis L2-3: Mild disc and mild facet degeneration.  Negative for stenosis L3-4: Severe facet degeneration bilaterally. Severe spinal stenosis. Extruded disc fragment on the left with large disc fragment extending superiorly behind the left L3 vertebral body. Left L3 nerve root impingement. L4-5: Severe spinal stenosis. 8 mm anterolisthesis with severe facet degeneration bilaterally. Right foraminal disc protrusion with impingement of the right L4 nerve root due to disc protrusion and foraminal encroachment. L5-S1: Moderate facet degeneration. Moderate subarticular stenosis bilaterally with mild flattening of the S1 nerve roots bilaterally. 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 In addition, there is a large extruded disc fragment extending superiorly behind the L3 vertebral body on the left. Severe spinal stenosis L4-5 with 8 mm anterolisthesis and severe facet degeneration. Right foraminal encroachment with right L4 nerve root compression. Electronically Signed   By: Franchot Gallo M.D.   On: 06/22/2019 08:39   DG Swallowing Func-Speech Pathology  Result Date: 06/24/2019 Objective Swallowing Evaluation: Type of Study: MBS-Modified Barium Swallow Study  Patient Details Name: LACHRISHA SLEIGH MRN: [id number] Date of Birth: 12-May-1940 Today's Date: 06/24/2019 Time: SLP Start Time (ACUTE ONLY): 1200 -SLP Stop Time (ACUTE ONLY): 1218 SLP Time Calculation (min) (ACUTE ONLY): 18 min Past Medical History: Past Medical History: Diagnosis Date . Chronic kidney disease   CKD . Hypercholesteremia  .  Hypertension  . Hypothyroidism  . Trimalleolar fracture   RIGHT ANKLE Past Surgical History: Past Surgical History: Procedure Laterality Date . ORIF ANKLE FRACTURE Right 10/09/2018  Procedure: OPEN REDUCTION INTERNAL FIXATION (ORIF) Right ankle trimalleolar fracture;  Surgeon: Wylene Simmer, MD;  Location: [facility];  Service: Orthopedics;  Laterality: Right;  61min HPI: DILLEN GUBSER is a 79 y.o. female with PMH of hypertension, hyperlipidemia, hypothyroidism, right ankle fracture, neuropathy, CKD, fatigable dysphagia, ptosis, dysarthria following Neurontin administration. Suspected to have Myasthenia Gravis.  Subjective: cooperative Assessment / Plan / Recommendation CHL IP CLINICAL IMPRESSIONS 06/24/2019 Clinical Impression Pt demonstrates a moderate oropharyngeal dysphagia characterized by fatiguable weakness in the oral and pharyngeal musculature. Most sgnifiantly there is decreased base of tongue strength, reduced hyoid excursion, reduced epiglottic deflection and weak pharyngeal contraction. This results in significant vallecular residue, increasing with viscosity. Pt is best able to manage sips of thin liquids, is able to clear 80% of residue with 2-3 swallows. If trace penetration occurs, pt senses it and ejects it. Residue with puree was severe and required a liquid wash to clear with increased chance of aspiration. Positional strategies attempted without benefit. Pt could not achieve effortful swallow. Recommend pt continue with cortrak for primary source of nutrition, though she can also take sips of thin liquids for comfort and therapeutic activities. Hopeful for increased strength with medical management.   SLP Visit Diagnosis Dysphagia, oropharyngeal phase (R13.12) Attention and concentration deficit following -- Frontal lobe and executive function deficit following -- Impact on safety and function Moderate aspiration risk   CHL IP TREATMENT RECOMMENDATION 06/24/2019 Treatment  Recommendations Therapy as outlined in treatment plan below   Prognosis 06/24/2019 Prognosis for Safe Diet Advancement Good Barriers to Reach Goals -- Barriers/Prognosis Comment -- CHL IP DIET RECOMMENDATION 06/24/2019 SLP Diet Recommendations Thin liquid Liquid Administration via Cup;Straw Medication Administration Via alternative means Compensations -- Postural Changes --   No flowsheet data found.  CHL IP FOLLOW UP RECOMMENDATIONS 06/24/2019 Follow up Recommendations Other (comment)   CHL IP FREQUENCY AND DURATION 06/24/2019 Speech Therapy Frequency (ACUTE ONLY) min 2x/week Treatment Duration 2 weeks      CHL IP ORAL PHASE 06/24/2019 Oral Phase Impaired Oral - Pudding Teaspoon -- Oral - Pudding Cup -- Oral - Honey Teaspoon -- Oral - Honey Cup -- Oral - Nectar Teaspoon -- Oral - Nectar Cup -- Oral - Nectar Straw -- Oral - Thin Teaspoon Left pocketing in lateral sulci;Right pocketing in lateral sulci;Weak lingual manipulation Oral - Thin Cup Left pocketing in lateral sulci;Right pocketing in lateral sulci;Weak lingual manipulation Oral - Thin Straw Left pocketing in lateral sulci;Right pocketing in lateral sulci;Weak lingual manipulation Oral - Puree Left pocketing in lateral sulci;Right pocketing in lateral sulci;Weak lingual manipulation Oral - Mech Soft -- Oral - Regular -- Oral - Multi-Consistency -- Oral - Pill -- Oral Phase - Comment --  CHL IP PHARYNGEAL PHASE 06/24/2019 Pharyngeal Phase Impaired Pharyngeal- Pudding Teaspoon -- Pharyngeal -- Pharyngeal- Pudding Cup -- Pharyngeal -- Pharyngeal- Honey Teaspoon -- Pharyngeal -- Pharyngeal- Honey Cup -- Pharyngeal -- Pharyngeal- Nectar Teaspoon -- Pharyngeal -- Pharyngeal- Nectar Cup Reduced epiglottic inversion;Reduced anterior laryngeal mobility;Reduced tongue base retraction;Penetration/Apiration after swallow;Pharyngeal residue - valleculae Pharyngeal Material enters airway, CONTACTS cords and then ejected out;Material enters airway, remains ABOVE vocal cords and  not ejected out Pharyngeal- Nectar Straw -- Pharyngeal -- Pharyngeal- Thin Teaspoon Reduced epiglottic inversion;Reduced anterior laryngeal mobility;Reduced tongue base retraction;Penetration/Apiration after swallow;Pharyngeal residue - valleculae Pharyngeal Material enters airway, remains ABOVE vocal  cords then ejected out;Material does not enter airway Pharyngeal- Thin Cup Reduced epiglottic inversion;Reduced anterior laryngeal mobility;Reduced tongue base retraction;Penetration/Apiration after swallow;Pharyngeal residue - valleculae Pharyngeal Material enters airway, remains ABOVE vocal cords then ejected out;Material does not enter airway Pharyngeal- Thin Straw Reduced epiglottic inversion;Reduced anterior laryngeal mobility;Reduced tongue base retraction;Penetration/Apiration after swallow;Pharyngeal residue - valleculae Pharyngeal Material enters airway, remains ABOVE vocal cords then ejected out;Material does not enter airway Pharyngeal- Puree Reduced epiglottic inversion;Reduced anterior laryngeal mobility;Reduced tongue base retraction;Pharyngeal residue - valleculae Pharyngeal Material does not enter airway Pharyngeal- Mechanical Soft -- Pharyngeal -- Pharyngeal- Regular -- Pharyngeal -- Pharyngeal- Multi-consistency -- Pharyngeal -- Pharyngeal- Pill -- Pharyngeal -- Pharyngeal Comment --  No flowsheet data found. DeBlois, Katherene Ponto 06/24/2019, 12:36 PM                Antimicrobials:   None   Subjective: Slowly improving, still has a nif of -25  Objective: Vitals:   06/26/19 2258 06/27/19 0314 06/27/19 0800 06/27/19 1100  BP: (!) 142/74 137/84 (!) 146/99 135/78  Pulse: 88 85 86   Resp:    18  Temp: 98.5 F (36.9 C)  98.6 F (37 C) 98.8 F (37.1 C)  TempSrc:   Oral Oral  SpO2: 99% 98% 98%   Weight:      Height:       No intake or output data in the 24 hours ending 06/27/19 1229 Filed Weights   06/22/19 2046 06/23/19 0301  Weight: 63 kg 63 kg    Examination:  General  exam: Appears calm and comfortable  Respiratory system: Clear to auscultation.  Moderate air movement. Cardiovascular system: S1 & S2 heard, RRR. No JVD, murmurs, rubs, gallops or clicks. No pedal edema. Gastrointestinal system: Abdomen is nondistended, soft and nontender. No organomegaly or masses felt. Normal bowel sounds heard. Central nervous system: Alert and oriented. No focal neurological deficits. Extremities: Symmetric 5 x 5 power. Skin: No rashes, lesions or ulcers Psychiatry: Judgement and insight appear normal. Mood & affect appropriate.     Data Reviewed: I have personally reviewed following labs and imaging studies  CBC: Recent Labs  Lab 06/22/19 2101 06/22/19 2110 06/24/19 0126 06/26/19 0231 06/27/19 0226  WBC 11.5*  --  19.8* 10.8* 8.9  NEUTROABS 7.7  --   --   --   --   HGB 15.2* 12.2 15.1* 14.7 14.0  HCT 44.8 36.0 44.5 43.5 41.5  MCV 88.0  --  89.0 89.0 88.7  PLT 380  --  393 333 [id number]   Basic Metabolic Panel: Recent Labs  Lab 06/22/19 2101 06/22/19 2110 06/24/19 0126 06/25/19 0218 06/26/19 0231  NA 137 140 141 143 141  K 3.9 3.1* 4.6 3.2* 3.6  CL 101 106 108 107 107  CO2 19*  --  19* 24 24  GLUCOSE 98 85 146* 133* 139*  BUN 17 18 22 21 20   CREATININE 0.91 0.50 0.90 0.74 0.71  CALCIUM 9.2  --  9.7 9.4 9.6   GFR: Estimated Creatinine Clearance: 53 mL/min (by C-G formula based on SCr of 0.71 mg/dL). Liver Function Tests: Recent Labs  Lab 06/22/19 2101  AST 31  ALT 18  ALKPHOS 76  BILITOT 1.5*  PROT 7.3  ALBUMIN 4.3   No results for input(s): LIPASE, AMYLASE in the last 168 hours. No results for input(s): AMMONIA in the last 168 hours. Coagulation Profile: Recent Labs  Lab 06/22/19 2101  INR 1.0   Cardiac Enzymes: No results for input(s): CKTOTAL, CKMB, CKMBINDEX,  TROPONINI in the last 168 hours. BNP (last 3 results) No results for input(s): PROBNP in the last 8760 hours. HbA1C: No results for input(s): HGBA1C in the last 72  hours. CBG: Recent Labs  Lab 06/26/19 1933 06/26/19 2257 06/27/19 0315 06/27/19 0826 06/27/19 1206  GLUCAP 94 109* 136* 114* 114*   Lipid Profile: No results for input(s): CHOL, HDL, LDLCALC, TRIG, CHOLHDL, LDLDIRECT in the last 72 hours. Thyroid Function Tests: No results for input(s): TSH, T4TOTAL, FREET4, T3FREE, THYROIDAB in the last 72 hours. Anemia Panel: No results for input(s): VITAMINB12, FOLATE, FERRITIN, TIBC, IRON, RETICCTPCT in the last 72 hours. Sepsis Labs: No results for input(s): PROCALCITON, LATICACIDVEN in the last 168 hours.  Recent Results (from the past 240 hour(s))  SARS CORONAVIRUS 2 (TAT 6-24 HRS) Nasopharyngeal Nasopharyngeal Swab     Status: None   Collection Time: 06/22/19 10:59 PM   Specimen: Nasopharyngeal Swab  Result Value Ref Range Status   SARS Coronavirus 2 NEGATIVE NEGATIVE Final    Comment: (NOTE) SARS-CoV-2 target nucleic acids are NOT DETECTED. The SARS-CoV-2 RNA is generally detectable in upper and lower respiratory specimens during the acute phase of infection. Negative results do not preclude SARS-CoV-2 infection, do not rule out co-infections with other pathogens, and should not be used as the sole basis for treatment or other patient management decisions. Negative results must be combined with clinical observations, patient history, and epidemiological information. The expected result is Negative. Fact Sheet for Patients: SugarRoll.be Fact Sheet for Healthcare Providers: https://www.woods-mathews.com/ This test is not yet approved or cleared by the [place] FDA and  has been authorized for detection and/or diagnosis of SARS-CoV-2 by FDA under an Emergency Use Authorization (EUA). This EUA will remain  in effect (meaning this test can be used) for the duration of the COVID-19 declaration under Section 56 4(b)(1) of the Act, 21 U.S.C. section 360bbb-3(b)(1), unless the authorization is  terminated or revoked sooner. Performed at [facility], [place] [address] [place], [place] [address]          Radiology Studies: No results found.      Scheduled Meds: . amLODipine  5 mg Oral Daily  . aspirin EC  81 mg Oral QODAY  . atorvastatin  20 mg Per NG tube q1800  . enoxaparin (LOVENOX) injection  40 mg Subcutaneous Q24H  . levothyroxine  88 mcg Oral QAC breakfast  . pyridostigmine  90 mg Oral Q4H while awake   Continuous Infusions: . feeding supplement (JEVITY 1.2 CAL) 1,000 mL (06/25/19 1801)  . Immune Globulin 10% Stopped (06/27/19 1221)     LOS: 5 days    Time spent: 66 min     Nicolette Bang, MD [facility]  If 7PM-7AM, please contact night-coverage  06/27/2019, 12:29 PM

## 2019-06-27 NOTE — Progress Notes (Addendum)
Subjective: VC stable, NIF I think still cooperation dependant.   Exam: Vitals:   06/27/19 1335 06/27/19 1541  BP: (!) 149/88 138/88  Pulse: 82 77  Resp: 20 19  Temp: 98.2 F (36.8 C) 98.3 F (36.8 C)  SpO2:  98%   Gen: In bed, NAD Resp: non-labored breathing, no acute distress Abd: soft, nt  Neuro: MS: Awake, alert, oriented CN: ptosis better this am, still with fatiguable dysarthria Motor: She has 4/5 strength in arms and legs, with the exception of 3-/5 shoulder abduction, and 4-/5 neck flexion. Sensory: Intact light touch  Pertinent Labs: ACHR antibodies pending  Impression: 79 year old female with symptoms of dysphagia, dysarthria, ptosis  which manifested following initiation of Neurontin. These symptoms are most consistent with myasthenia gravis. Neurontin is known to worsen myasthenia gravis, and I suspect that she may have had subsymptomatic disease worsened by gabapentin.  Her negative inspiratory force is quite low, but this is with poor coordination of the patient's part, I think that the vital capacity is more accurately reflecting her current respiratory status(>20 mL/kg) and though borderline is still above where I would consider elective intubation.  She will need steroids, but I would favor beginning to see a response to IVIG prior to initiating.   Recommendations: 1) continue Mestinon 90 mg every 4 hours.  2) IVIG, today is day 4 of 5 3) neurology will continue to follow   Roland Rack, MD [facility] [phone]  If 7pm- 7am, please page neurology on call as listed in [place].

## 2019-06-27 NOTE — Progress Notes (Signed)
Patient received on Room air.  Saturation 95%.  NIF -29 and VC 1.25 with excellent technique.  Patient did experience a headache after the NIF.

## 2019-06-28 LAB — GLUCOSE, CAPILLARY
Glucose-Capillary: 117 mg/dL — ABNORMAL HIGH (ref 70–99)
Glucose-Capillary: 111 mg/dL — ABNORMAL HIGH (ref 70–99)
Glucose-Capillary: 163 mg/dL — ABNORMAL HIGH (ref 70–99)
Glucose-Capillary: 141 mg/dL — ABNORMAL HIGH (ref 70–99)
Glucose-Capillary: 112 mg/dL — ABNORMAL HIGH (ref 70–99)

## 2019-06-28 LAB — BASIC METABOLIC PANEL
Anion gap: 8 (ref 5–15)
BUN: 22 mg/dL (ref 8–23)
CO2: 27 mmol/L (ref 22–32)
Calcium: 9.1 mg/dL (ref 8.9–10.3)
Chloride: 101 mmol/L (ref 98–111)
Creatinine, Ser: 0.76 mg/dL (ref 0.44–1.00)
GFR calc Af Amer: >60 mL/min (ref >60)
GFR calc non Af Amer: >60 mL/min (ref >60)
Glucose, Bld: 113 mg/dL — ABNORMAL HIGH (ref 70–99)
Potassium: 3.7 mmol/L (ref 3.5–5.1)
Sodium: 136 mmol/L (ref 135–145)

## 2019-06-28 LAB — CBC
HCT: 41.9 % (ref 36.0–46.0)
Hemoglobin: 14 g/dL (ref 12.0–15.0)
MCH: 30.2 pg (ref 26.0–34.0)
MCHC: 33.4 g/dL (ref 30.0–36.0)
MCV: 90.5 fL (ref 80.0–100.0)
Platelets: 280 10*3/uL (ref 150–400)
RBC: 4.63 MIL/uL (ref 3.87–5.11)
RDW: 13.2 % (ref 11.5–15.5)
WBC: 7.4 10*3/uL (ref 4.0–10.5)
nRBC: 0 % (ref 0.0–0.2)

## 2019-06-28 MED ORDER — PREDNISONE 20 MG PO TABS
20.0000 mg | ORAL_TABLET | Freq: Every day | ORAL | Status: DC
  Administered 2019-06-28 – 2019-07-01 (×4): 20 mg via ORAL
  Filled 2019-06-28 (×4): qty 1

## 2019-06-28 MED ORDER — PREDNISONE 20 MG PO TABS: 20.0000 mg | ORAL_TABLET | Freq: Every day | ORAL | Status: DC

## 2019-06-28 NOTE — Progress Notes (Signed)
NIF -30 VC 1.5 L

## 2019-06-28 NOTE — Progress Notes (Signed)
PROGRESS NOTE    Audrey Cowan  [id number] DOB: 07-06-1940 DOA: 06/22/2019 PCP: Carol Ada, MD   Brief Narrative:  79 YOF with hypertension, HLD, hypothyroidism, recent right ankle fracture in Jan , neuropathy, vitamin B12 deficiency, CKD III presented to ER with difficulty speaking and swallowing x 3 days. She presented to the ER where he was afebrile vitals stable, WBC 11.5K, potassium 3.1 COVID-19 negative, CVA was suspected and CT/MRI brain was obtained that was unremarkable and neurology was consulted suggested possible neurovascular disease including myasthenia gravis patient was admitted for further management. She was noted to have ptosis as well, and suspected Myasthenia Gravisworsened by Neurontin.Started on Mesnitinwith improvement in ptosis.  3/17-started on IVIG,with frequent NIF and FVC monitoring   Assessment & Plan:   Principal Problem:   Generalized weakness Active Problems:   Benign essential HTN   Hypothyroidism   Hyperlipidemia   CKD (chronic kidney disease), stage III   Leucocytosis   Hypokalemia   Dysarthria   Generalized weakness with dysarthria/dysphagia and ptosis:Suspecting myasthenia gravis worsened by Neurontin.Started onMesnitinwith improvement in ptosis. No evidence of acute stroke on CT and MRI. Neurology on board.Anti-MU SK antibodies, ACR's antibodiespending from 3/16.Cont Mestinonincreased to 90mg  every 4 hours(started at 30)IVIG day # 5/5(started 3/17). VCstable but NIF is cooperation dependent as sometimes she does not have a good effort. Monitor frequent NIF-most recent -30, FVC. hold Neurontin. NEURO STARTED PREDNISONE 3/21  Leukocytosis: likely reactive/in the setting of #1.Chest x-ray unremarkable. Afebrile and WBC normal at 8.9  Benign essential HTN-BP remainsborderline controlled. Intermittently high. Resume homeAMLODIPINE but cont to holdACEI, LASIX on hold.  Metabolic acidosisbicarb improved to  27  Hypokalemia-resolved.3.7  HLD-contstatins.  Hypothyroidism-tsh stable.cont Synthroid.  H x of CKD (chronic kidney disease), stage III a- creat stable and clearance is > 60.monitor.  Had covid shots x2 , last dose on feb 6, per patient.  Body mass index is 23.13 kg/m.  DVT prophylaxis: Lovenox SQ  Code Status: full    Code Status Orders  (From admission, onward)         Start     Ordered   06/23/19 1045  Full code  Continuous     06/23/19 1045        Code Status History    This patient has a current code status but no historical code status.   Advance Care Planning Activity     Family Communication: called son, Leroy Sea, answered all questions Disposition Plan:   Patient is from home, unstable for discharge currently still with generalized weakness, dysarthria dysphagia is improving, still has a low negative inspiratory factor but improving.  Completing IVIG today. Consults called: None Admission status: Inpatient   Consultants:   Neurology  Procedures:  DG Chest 1 View  Result Date: 06/24/2019 CLINICAL DATA:  Shortness of breath. EXAM: CHEST  1 VIEW COMPARISON:  May 05, 2013. FINDINGS: The heart size and mediastinal contours are within normal limits. No pneumothorax or pleural effusion is noted. Stable minimal bibasilar subsegmental atelectasis or scarring. Feeding tube is seen entering stomach. The visualized skeletal structures are unremarkable. IMPRESSION: Stable minimal bibasilar subsegmental atelectasis or scarring. Electronically Signed   By: Marijo Conception M.D.   On: 06/24/2019 12:19   CT HEAD WO CONTRAST  Result Date: 06/22/2019 CLINICAL DATA:  Slurred speech and trouble swallowing EXAM: CT HEAD WITHOUT CONTRAST TECHNIQUE: Contiguous axial images were obtained from the base of the skull through the vertex without intravenous contrast. COMPARISON:  None. FINDINGS: Brain: No acute  territorial infarction, hemorrhage or intracranial mass. Mild  atrophy. Moderate hypodensity in the white matter consistent with chronic small vessel ischemic change. Nonenlarged ventricles. Vascular: No hyperdense vessels.  Carotid vascular calcification. Skull: Normal. Negative for fracture or focal lesion. Sinuses/Orbits: No acute finding. Other: None IMPRESSION: 1. No CT evidence for acute intracranial abnormality. 2. Atrophy and chronic small vessel ischemic changes of the white matter Electronically Signed   By: Donavan Foil M.D.   On: 06/22/2019 22:29   MR BRAIN WO CONTRAST  Result Date: 06/22/2019 CLINICAL DATA:  Transient ischemic attack EXAM: MRI HEAD WITHOUT CONTRAST TECHNIQUE: Multiplanar, multiecho pulse sequences of the brain and surrounding structures were obtained without intravenous contrast. COMPARISON:  None. FINDINGS: Brain: No acute infarct, acute hemorrhage or extra-axial collection. Diffuse confluent hyperintense T2-weighted signal within the periventricular, deep and juxtacortical white matter, most commonly due to chronic ischemic microangiopathy. Normal volume of CSF spaces. Single chronic microhemorrhage in the left parietal lobe. Normal midline structures. Vascular: Normal flow voids. Skull and upper cervical spine: Normal marrow signal. Sinuses/Orbits: Negative. Other: None. IMPRESSION: 1. No acute intracranial abnormality. 2. 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 Electronically Signed   By: Ulyses Jarred M.D.   On: 06/22/2019 22:37   MR LUMBAR SPINE WO CONTRAST  Result Date: 06/22/2019 CLINICAL DATA:  Bilateral leg weakness and numbness. EXAM: MRI LUMBAR SPINE WITHOUT CONTRAST TECHNIQUE: Multiplanar, multisequence MR imaging of the lumbar spine was performed. No intravenous contrast was administered. COMPARISON:  None. FINDINGS: Segmentation:  Normal Alignment: Mild retrolisthesis T11-12, T12-L1, L1-2. 8 mm anterolisthesis L4-5. Vertebrae:  Normal bone marrow.  Negative for fracture or mass. Conus medullaris and  cauda equina: Conus extends to the T12-L1 level. Conus and cauda equina appear normal. Paraspinal and other soft tissues: Negative for paraspinous mass or adenopathy. Disc levels: T12-L1: 4 mm retrolisthesis. Central small disc protrusion and mild facet degeneration. Negative for stenosis L1-2: 4 mm retrolisthesis. Mild disc and facet degeneration not causing significant spinal or foraminal stenosis L2-3: Mild disc and mild facet degeneration.  Negative for stenosis L3-4: Severe facet degeneration bilaterally. Severe spinal stenosis. Extruded disc fragment on the left with large disc fragment extending superiorly behind the left L3 vertebral body. Left L3 nerve root impingement. L4-5: Severe spinal stenosis. 8 mm anterolisthesis with severe facet degeneration bilaterally. Right foraminal disc protrusion with impingement of the right L4 nerve root due to disc protrusion and foraminal encroachment. L5-S1: Moderate facet degeneration. Moderate subarticular stenosis bilaterally with mild flattening of the S1 nerve roots bilaterally. 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 In addition, there is a large extruded disc fragment extending superiorly behind the L3 vertebral body on the left. Severe spinal stenosis L4-5 with 8 mm anterolisthesis and severe facet degeneration. Right foraminal encroachment with right L4 nerve root compression. Electronically Signed   By: Franchot Gallo M.D.   On: 06/22/2019 08:39   DG Swallowing Func-Speech Pathology  Result Date: 06/24/2019 Objective Swallowing Evaluation: Type of Study: MBS-Modified Barium Swallow Study  Patient Details Name: DESHAWN MABRAY MRN: [id number] Date of Birth: November 16, 1940 Today's Date: 06/24/2019 Time: SLP Start Time (ACUTE ONLY): 1200 -SLP Stop Time (ACUTE ONLY): 1218 SLP Time Calculation (min) (ACUTE ONLY): 18 min Past Medical History:  Past Medical History: Diagnosis Date . Chronic kidney disease   CKD . Hypercholesteremia  . Hypertension  . Hypothyroidism  . Trimalleolar fracture   RIGHT ANKLE Past Surgical  History: Past Surgical History: Procedure Laterality Date . ORIF ANKLE FRACTURE Right 10/09/2018  Procedure: OPEN REDUCTION INTERNAL FIXATION (ORIF) Right ankle trimalleolar fracture;  Surgeon: Wylene Simmer, MD;  Location: [facility];  Service: Orthopedics;  Laterality: Right;  106min HPI: ANALYSA DOUGHMAN is a 79 y.o. female with PMH of hypertension, hyperlipidemia, hypothyroidism, right ankle fracture, neuropathy, CKD, fatigable dysphagia, ptosis, dysarthria following Neurontin administration. Suspected to have Myasthenia Gravis.  Subjective: cooperative Assessment / Plan / Recommendation CHL IP CLINICAL IMPRESSIONS 06/24/2019 Clinical Impression Pt demonstrates a moderate oropharyngeal dysphagia characterized by fatiguable weakness in the oral and pharyngeal musculature. Most sgnifiantly there is decreased base of tongue strength, reduced hyoid excursion, reduced epiglottic deflection and weak pharyngeal contraction. This results in significant vallecular residue, increasing with viscosity. Pt is best able to manage sips of thin liquids, is able to clear 80% of residue with 2-3 swallows. If trace penetration occurs, pt senses it and ejects it. Residue with puree was severe and required a liquid wash to clear with increased chance of aspiration. Positional strategies attempted without benefit. Pt could not achieve effortful swallow. Recommend pt continue with cortrak for primary source of nutrition, though she can also take sips of thin liquids for comfort and therapeutic activities. Hopeful for increased strength with medical management.   SLP Visit Diagnosis Dysphagia, oropharyngeal phase (R13.12) Attention and concentration deficit following -- Frontal lobe and executive function deficit following -- Impact on safety and  function Moderate aspiration risk   CHL IP TREATMENT RECOMMENDATION 06/24/2019 Treatment Recommendations Therapy as outlined in treatment plan below   Prognosis 06/24/2019 Prognosis for Safe Diet Advancement Good Barriers to Reach Goals -- Barriers/Prognosis Comment -- CHL IP DIET RECOMMENDATION 06/24/2019 SLP Diet Recommendations Thin liquid Liquid Administration via Cup;Straw Medication Administration Via alternative means Compensations -- Postural Changes --   No flowsheet data found.  CHL IP FOLLOW UP RECOMMENDATIONS 06/24/2019 Follow up Recommendations Other (comment)   CHL IP FREQUENCY AND DURATION 06/24/2019 Speech Therapy Frequency (ACUTE ONLY) min 2x/week Treatment Duration 2 weeks      CHL IP ORAL PHASE 06/24/2019 Oral Phase Impaired Oral - Pudding Teaspoon -- Oral - Pudding Cup -- Oral - Honey Teaspoon -- Oral - Honey Cup -- Oral - Nectar Teaspoon -- Oral - Nectar Cup -- Oral - Nectar Straw -- Oral - Thin Teaspoon Left pocketing in lateral sulci;Right pocketing in lateral sulci;Weak lingual manipulation Oral - Thin Cup Left pocketing in lateral sulci;Right pocketing in lateral sulci;Weak lingual manipulation Oral - Thin Straw Left pocketing in lateral sulci;Right pocketing in lateral sulci;Weak lingual manipulation Oral - Puree Left pocketing in lateral sulci;Right pocketing in lateral sulci;Weak lingual manipulation Oral - Mech Soft -- Oral - Regular -- Oral - Multi-Consistency -- Oral - Pill -- Oral Phase - Comment --  CHL IP PHARYNGEAL PHASE 06/24/2019 Pharyngeal Phase Impaired Pharyngeal- Pudding Teaspoon -- Pharyngeal -- Pharyngeal- Pudding Cup -- Pharyngeal -- Pharyngeal- Honey Teaspoon -- Pharyngeal -- Pharyngeal- Honey Cup -- Pharyngeal -- Pharyngeal- Nectar Teaspoon -- Pharyngeal -- Pharyngeal- Nectar Cup Reduced epiglottic inversion;Reduced anterior laryngeal mobility;Reduced tongue base retraction;Penetration/Apiration after swallow;Pharyngeal residue - valleculae Pharyngeal Material enters airway,  CONTACTS cords and then ejected out;Material enters airway, remains ABOVE vocal cords and not ejected out Pharyngeal- Nectar Straw -- Pharyngeal -- Pharyngeal- Thin Teaspoon Reduced epiglottic inversion;Reduced anterior laryngeal mobility;Reduced tongue base retraction;Penetration/Apiration after swallow;Pharyngeal residue - valleculae Pharyngeal Material enters airway, remains ABOVE vocal cords then ejected out;Material does not enter airway Pharyngeal- Thin Cup Reduced epiglottic inversion;Reduced  anterior laryngeal mobility;Reduced tongue base retraction;Penetration/Apiration after swallow;Pharyngeal residue - valleculae Pharyngeal Material enters airway, remains ABOVE vocal cords then ejected out;Material does not enter airway Pharyngeal- Thin Straw Reduced epiglottic inversion;Reduced anterior laryngeal mobility;Reduced tongue base retraction;Penetration/Apiration after swallow;Pharyngeal residue - valleculae Pharyngeal Material enters airway, remains ABOVE vocal cords then ejected out;Material does not enter airway Pharyngeal- Puree Reduced epiglottic inversion;Reduced anterior laryngeal mobility;Reduced tongue base retraction;Pharyngeal residue - valleculae Pharyngeal Material does not enter airway Pharyngeal- Mechanical Soft -- Pharyngeal -- Pharyngeal- Regular -- Pharyngeal -- Pharyngeal- Multi-consistency -- Pharyngeal -- Pharyngeal- Pill -- Pharyngeal -- Pharyngeal Comment --  No flowsheet data found. DeBlois, Katherene Ponto 06/24/2019, 12:36 PM                Antimicrobials:   None   Subjective: Improving, dysarthria dysphagia resolving Respiratory status improving  Objective: Vitals:   06/27/19 2348 06/28/19 0328 06/28/19 0806 06/28/19 0810  BP:  137/80 (!) 148/79   Pulse:  73 83   Resp:  20    Temp: 98 F (36.7 C) 98 F (36.7 C) 98.3 F (36.8 C)   TempSrc: Oral Oral Oral   SpO2:    97%  Weight:  62.3 kg    Height:       No intake or output data in the 24 hours ending  06/28/19 1116 Filed Weights   06/22/19 2046 06/23/19 0301 06/28/19 0328  Weight: 63 kg 63 kg 62.3 kg    Examination:  General exam: Appears calm and comfortable  Respiratory system: Clear to auscultation.  Moderate air movement with moderate respiratory effort Cardiovascular system: S1 & S2 heard, RRR. No JVD, murmurs, rubs, gallops or clicks. No pedal edema. Gastrointestinal system: Abdomen is nondistended, soft and nontender. No organomegaly or masses felt. Normal bowel sounds heard. Central nervous system: Alert and oriented. No focal neurological deficits. Extremities: Warm well perfused, neurovascularly intact Skin: No rashes, lesions or ulcers Psychiatry: Judgement and insight appear normal. Mood & affect appropriate.     Data Reviewed: I have personally reviewed following labs and imaging studies  CBC: Recent Labs  Lab 06/22/19 2101 06/22/19 2101 06/22/19 2110 06/24/19 0126 06/26/19 0231 06/27/19 0226 06/28/19 0409  WBC 11.5*  --   --  19.8* 10.8* 8.9 7.4  NEUTROABS 7.7  --   --   --   --   --   --   HGB 15.2*   < > 12.2 15.1* 14.7 14.0 14.0  HCT 44.8   < > 36.0 44.5 43.5 41.5 41.9  MCV 88.0  --   --  89.0 89.0 88.7 90.5  PLT 380  --   --  393 333 286 280   < > = values in this interval not displayed.   Basic Metabolic Panel: Recent Labs  Lab 06/22/19 2101 06/22/19 2101 06/22/19 2110 06/24/19 0126 06/25/19 0218 06/26/19 0231 06/28/19 0409  NA 137   < > 140 141 143 141 136  K 3.9   < > 3.1* 4.6 3.2* 3.6 3.7  CL 101   < > 106 108 107 107 101  CO2 19*  --   --  19* 24 24 27   GLUCOSE 98   < > 85 146* 133* 139* 113*  BUN 17   < > 18 22 21 20 22   CREATININE 0.91   < > 0.50 0.90 0.74 0.71 0.76  CALCIUM 9.2  --   --  9.7 9.4 9.6 9.1   < > = values in this interval not displayed.  GFR: Estimated Creatinine Clearance: 53 mL/min (by C-G formula based on SCr of 0.76 mg/dL). Liver Function Tests: Recent Labs  Lab 06/22/19 2101  AST 31  ALT 18  ALKPHOS 76    BILITOT 1.5*  PROT 7.3  ALBUMIN 4.3   No results for input(s): LIPASE, AMYLASE in the last 168 hours. No results for input(s): AMMONIA in the last 168 hours. Coagulation Profile: Recent Labs  Lab 06/22/19 2101  INR 1.0   Cardiac Enzymes: No results for input(s): CKTOTAL, CKMB, CKMBINDEX, TROPONINI in the last 168 hours. BNP (last 3 results) No results for input(s): PROBNP in the last 8760 hours. HbA1C: No results for input(s): HGBA1C in the last 72 hours. CBG: Recent Labs  Lab 06/27/19 1538 06/27/19 1934 06/27/19 2347 06/28/19 0329 06/28/19 0741  GLUCAP 86 99 107* 117* 111*   Lipid Profile: No results for input(s): CHOL, HDL, LDLCALC, TRIG, CHOLHDL, LDLDIRECT in the last 72 hours. Thyroid Function Tests: No results for input(s): TSH, T4TOTAL, FREET4, T3FREE, THYROIDAB in the last 72 hours. Anemia Panel: No results for input(s): VITAMINB12, FOLATE, FERRITIN, TIBC, IRON, RETICCTPCT in the last 72 hours. Sepsis Labs: No results for input(s): PROCALCITON, LATICACIDVEN in the last 168 hours.  Recent Results (from the past 240 hour(s))  SARS CORONAVIRUS 2 (TAT 6-24 HRS) Nasopharyngeal Nasopharyngeal Swab     Status: None   Collection Time: 06/22/19 10:59 PM   Specimen: Nasopharyngeal Swab  Result Value Ref Range Status   SARS Coronavirus 2 NEGATIVE NEGATIVE Final    Comment: (NOTE) SARS-CoV-2 target nucleic acids are NOT DETECTED. The SARS-CoV-2 RNA is generally detectable in upper and lower respiratory specimens during the acute phase of infection. Negative results do not preclude SARS-CoV-2 infection, do not rule out co-infections with other pathogens, and should not be used as the sole basis for treatment or other patient management decisions. Negative results must be combined with clinical observations, patient history, and epidemiological information. The expected result is Negative. Fact Sheet for Patients: SugarRoll.be Fact Sheet  for Healthcare Providers: https://www.woods-mathews.com/ This test is not yet approved or cleared by the [place] FDA and  has been authorized for detection and/or diagnosis of SARS-CoV-2 by FDA under an Emergency Use Authorization (EUA). This EUA will remain  in effect (meaning this test can be used) for the duration of the COVID-19 declaration under Section 56 4(b)(1) of the Act, 21 U.S.C. section 360bbb-3(b)(1), unless the authorization is terminated or revoked sooner. Performed at [facility], [place] [address], [place], [place] [address]          Radiology Studies: No results found.      Scheduled Meds: . amLODipine  5 mg Oral Daily  . aspirin EC  81 mg Oral QODAY  . atorvastatin  20 mg Per NG tube q1800  . enoxaparin (LOVENOX) injection  40 mg Subcutaneous Q24H  . levothyroxine  88 mcg Oral QAC breakfast  . predniSONE  20 mg Oral Q breakfast  . pyridostigmine  90 mg Oral Q4H while awake   Continuous Infusions: . feeding supplement (JEVITY 1.2 CAL) 1,000 mL (06/25/19 1801)  . Immune Globulin 10% Stopped (06/27/19 1500)     LOS: 6 days    Time spent: 35 minutes    Nicolette Bang, MD [facility]  If 7PM-7AM, please contact night-coverage  06/28/2019, 11:16 AM

## 2019-06-28 NOTE — Plan of Care (Signed)
Patient assisted to [facility] without difficulty. Assessment completed. Tube feeding infusing without difficulty, 5 ml residual noted, placement vertified by air bolus.

## 2019-06-28 NOTE — Progress Notes (Signed)
NIF -35 VC 1.20L

## 2019-06-28 NOTE — Progress Notes (Signed)
Subjective: Complains of a lot of gas but no diarrhea or pain/cramping.   Exam: Vitals:   06/28/19 0806 06/28/19 0810  BP: (!) 148/79   Pulse: 83   Resp:    Temp: 98.3 F (36.8 C)   SpO2:  97%   Gen: In bed, NAD Resp: non-labored breathing, no acute distress Abd: soft, nt  Neuro: MS: Awake, alert, oriented CN: ptosis better this am, minimal ptosis on the left with upgaze at 90 seconds. Still with fatiguable dysarthria but this also is better than previous days.  Motor: She has 4/5 strength in arms and legs, with 4/5 should abduction, which is rapidly fatiguable.  4-/5 neck flexion. Sensory: Intact light touch  Pertinent Labs: ACHR antibodies pending MuSK ab pending.   Impression: 79 year old female with symptoms of dysphagia, dysarthria, ptosis  which manifested following initiation of Neurontin. These symptoms are most consistent with myasthenia gravis. Neurontin is known to worsen myasthenia gravis, and I suspect that she may have had subsymptomatic disease worsened by gabapentin.  Her negative inspiratory force is quite low, but this is with poor coordination of the patient's part, and I snow improving.   She appears to be having some response to IVIG, though not as robust as I would hope. I will start steroids, but will use a lower dose to mitigate the transient worsening with starting at higher doses.   If she continues to have a poor response, could consider additional IVIG or other treatments, but I would not pursue this without confirmation of diagnosis. If both antibodies are negative, would consider transfer for single fiber EMG and rep stim.   She complains of gas today, will monitor as GI side effects are common with mestinon, if these complaints continue may need to lower dose.   Recommendations: 1) continue Mestinon 90 mg every 4 hours.  2) IVIG, today is day 5 of 5 3) Start prednisone 20mg  daily 4) neurology will continue to follow   Roland Rack,  MD [facility] [phone]  If 7pm- 7am, please page neurology on call as listed in [place].

## 2019-06-29 LAB — GLUCOSE, CAPILLARY
Glucose-Capillary: 103 mg/dL — ABNORMAL HIGH (ref 70–99)
Glucose-Capillary: 118 mg/dL — ABNORMAL HIGH (ref 70–99)
Glucose-Capillary: 118 mg/dL — ABNORMAL HIGH (ref 70–99)
Glucose-Capillary: 123 mg/dL — ABNORMAL HIGH (ref 70–99)
Glucose-Capillary: 78 mg/dL (ref 70–99)
Glucose-Capillary: 93 mg/dL (ref 70–99)

## 2019-06-29 LAB — CREATININE, SERUM
Creatinine, Ser: 0.76 mg/dL (ref 0.44–1.00)
GFR calc Af Amer: >60 mL/min (ref >60)
GFR calc non Af Amer: >60 mL/min (ref >60)

## 2019-06-29 MED ORDER — AMLODIPINE BESYLATE 10 MG PO TABS
10.0000 mg | ORAL_TABLET | Freq: Every day | ORAL | Status: DC
  Administered 2019-06-29 – 2019-07-04 (×6): 10 mg via ORAL
  Filled 2019-06-29 (×5): qty 1
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
## 2019-06-29 NOTE — Progress Notes (Signed)
  Speech Language Pathology Treatment: Dysphagia  Patient Details Name: Audrey Cowan MRN: [id number] DOB: November 24, 1940 Today's Date: 06/29/2019 Time: 1355-1430 SLP Time Calculation (min) (ACUTE ONLY): 35 min  Assessment / Plan / Recommendation Clinical Impression  Pt was seen for skilled ST targeting dysphagia.  She was encountered awake/alert and was agreeable to treatment.  She voiced her displeasure with her treatment at [facility] and stated that she had already spoken with the nursing director.  Spoke with the pt's RN who was already aware.  Pt was seen with trials of thin liquid and puree.  She was able to feed herself without difficulty.  AP transport and swallow initiation appeared timely.  A delayed throat clear was observed following 2/12 trials of thin liquid.  No overt s/sx of aspiration were observed with puree trials and pt denied globus sensation.  She stated that she was hopeful that she could have the cortrak removed soon.  Recommend continuation of full liquids at this time with medications administered via alternative means.  SLP will f/u in the next 24-48 hours to evaluate readiness for clinical diet upgrade vs repeat MBS.     HPI HPI: Audrey Cowan is a 79 y.o. female with PMH of hypertension, hyperlipidemia, hypothyroidism, right ankle fracture, neuropathy, CKD, fatigable dysphagia, ptosis, dysarthria following Neurontin administration. Suspected to have Myasthenia Gravis.       SLP Plan  Continue with current plan of care       Recommendations  Diet recommendations: Other(comment)(Full liquids ) Liquids provided via: Cup;Straw Medication Administration: Via alternative means Supervision: Patient able to self feed Compensations: Slow rate;Small sips/bites Postural Changes and/or Swallow Maneuvers: Seated upright 90 degrees                Oral Care Recommendations: Oral care BID Follow up Recommendations: Other (comment)(TBD) SLP Visit Diagnosis:  Dysphagia, oropharyngeal phase (R13.12) Plan: Continue with current plan of care       GO               Colin Mulders., M.S., [place] Office: [phone]  [place] 06/29/2019, 2:38 PM

## 2019-06-29 NOTE — Plan of Care (Signed)
  Problem: Health Behavior/Discharge Planning: Goal: Ability to manage health-related needs will improve Outcome: Progressing   Problem: Clinical Measurements: Goal: Will remain free from infection Outcome: Progressing Goal: Diagnostic test results will improve Outcome: Progressing Goal: Respiratory complications will improve Outcome: Progressing   Problem: Activity: Goal: Risk for activity intolerance will decrease Outcome: Progressing Note: Collaborate with PT. Pt to ambulate in hallway with PT.    Problem: Coping: Goal: Level of anxiety will decrease Outcome: Progressing   Problem: Pain Managment: Goal: General experience of comfort will improve Outcome: Progressing   Problem: Skin Integrity: Goal: Risk for impaired skin integrity will decrease Outcome: Progressing

## 2019-06-29 NOTE — Progress Notes (Signed)
Neurology Progress Note   S:// Seen and examined.  Reports no acute changes   O:// Current vital signs: BP (!) 160/88 (BP Location: Left Arm)   Pulse 68   Temp 97.6 F (36.4 C) (Oral)   Resp 18   Ht 5\' 5"  (1.651 m)   Wt 62.3 kg   SpO2 98%   BMI 22.86 kg/m  Vital signs in last 24 hours: Temp:  [97.6 F (36.4 C)-98.7 F (37.1 C)] 97.6 F (36.4 C) (03/22 0754) Pulse Rate:  [68-91] 68 (03/22 0754) Resp:  [16-25] 18 (03/22 0754) BP: (128-160)/(72-89) 160/88 (03/22 0754) SpO2:  [94 %-98 %] 98 % (03/22 0754) General: Awake alert in no distress Respiratory: Nonlabored breathing Abdomen: Soft nondistended nontender Cardiovascular: Regular rate rhythm Neurological exam Awake alert oriented x3. Speech is not dysarthric No evidence of aphasia Cranial nerve exam: Did not see any ptosis, no ptosis with sustained upward gaze.  Mildly fatigable dysarthria.  Based on chart review and subjectively by the patient that is better than prior days. Motor exam: 4+/5 strength in arms and legs with the exception of more proximal 4/5 strength bilaterally and that is fatigable.  4-4+/5 next flexion. Sensory: Intact Coordination: No dysmetria   Medications  Current Facility-Administered Medications:  .  acetaminophen (TYLENOL) tablet 650 mg, 650 mg, Oral, Q6H PRN **OR** acetaminophen (TYLENOL) suppository 650 mg, 650 mg, Rectal, Q6H PRN, Jonelle Sidle, Mohammad L, MD .  amLODipine (NORVASC) tablet 5 mg, 5 mg, Oral, Daily, Kc, Ramesh, MD, 5 mg at 06/28/19 0945 .  aspirin EC tablet 81 mg, 81 mg, Oral, QODAY, Kc, Ramesh, MD, 81 mg at 06/28/19 0945 .  atorvastatin (LIPITOR) tablet 20 mg, 20 mg, Per NG tube, q1800, Kc, Ramesh, MD, 20 mg at 06/28/19 1656 .  enoxaparin (LOVENOX) injection 40 mg, 40 mg, Subcutaneous, Q24H, Garba, Mohammad L, MD, 40 mg at 06/28/19 1302 .  feeding supplement (JEVITY 1.2 CAL) liquid 1,000 mL, 1,000 mL, Per Tube, Continuous, Kc, Ramesh, MD, Last Rate: 60 mL/hr at 06/25/19 1801,  1,000 mL at 06/25/19 1801 .  hydrALAZINE (APRESOLINE) injection 5 mg, 5 mg, Intravenous, Q4H PRN, Kirby-Graham, Karsten Fells, NP, 5 mg at 06/23/19 2333 .  levothyroxine (SYNTHROID) tablet 88 mcg, 88 mcg, Oral, QAC breakfast, Elwyn Reach, MD, 88 mcg at 06/29/19 [phone] .  lip balm (CARMEX) ointment 1 application, 1 application, Topical, PRN, Kc, Ramesh, MD .  ondansetron (ZOFRAN) tablet 4 mg, 4 mg, Oral, Q6H PRN **OR** ondansetron (ZOFRAN) injection 4 mg, 4 mg, Intravenous, Q6H PRN, Garba, Mohammad L, MD .  predniSONE (DELTASONE) tablet 20 mg, 20 mg, Oral, Q breakfast, Greta Doom, MD, 20 mg at 06/29/19 [phone] .  pyridostigmine (MESTINON) tablet 90 mg, 90 mg, Oral, Q4H while awake, Greta Doom, MD, 90 mg at 06/29/19 [id number] Labs CBC    Component Value Date/Time   WBC 7.4 06/28/2019 0409   RBC 4.63 06/28/2019 0409   HGB 14.0 06/28/2019 0409   HCT 41.9 06/28/2019 0409   PLT 280 06/28/2019 0409   MCV 90.5 06/28/2019 0409   MCH 30.2 06/28/2019 0409   MCHC 33.4 06/28/2019 0409   RDW 13.2 06/28/2019 0409   LYMPHSABS 2.7 06/22/2019 2101   MONOABS 0.9 06/22/2019 2101   EOSABS 0.1 06/22/2019 2101   BASOSABS 0.1 06/22/2019 2101    CMP     Component Value Date/Time   NA 136 06/28/2019 0409   K 3.7 06/28/2019 0409   CL 101 06/28/2019 0409   CO2 27 06/28/2019  0409   GLUCOSE 113 (H) 06/28/2019 0409   BUN 22 06/28/2019 0409   CREATININE 0.76 06/29/2019 0541   CALCIUM 9.1 06/28/2019 0409   PROT 7.3 06/22/2019 2101   ALBUMIN 4.3 06/22/2019 2101   AST 31 06/22/2019 2101   ALT 18 06/22/2019 2101   ALKPHOS 76 06/22/2019 2101   BILITOT 1.5 (H) 06/22/2019 2101   GFRNONAA >60 06/29/2019 0541   GFRAA >60 06/29/2019 0541   AC HR antibodies and MuSK antibodies pending  Imaging No new imaging to review  Assessment: 79 year old woman presented with symptoms of dysphagia dysarthria ptosis following initiation of Neurontin.  Clinically exam consistent with myasthenia gravis.  Also  Neurontin is unknown exacerbator of myasthenic symptoms. Likely underlying disease masked by gabapentin initiation. Has had low negative inspiratory flows which are gradually improving. Myasthenia labs remain pending.  Awaiting call from the lab regarding the timing of test availability.  If labs are positive for myasthenia, would consider IVIG or possibly even plasma exchange but if it remains negative, would want to have a definitive diagnosis by single-fiber EMG and rep stim for which she might require transfer to a academic institution.  Recommendations: Completed 5 days of IVIG Continue Mestinon 90 every 4. Continue prednisone 20 mg daily We will follow    -- Amie Portland, MD [facility] Pager: [phone] If 7pm to 7am, please call on call as listed on AMION.

## 2019-06-29 NOTE — Progress Notes (Addendum)
PROGRESS NOTE    Audrey MAGES  [id number] DOB: 07-29-1940 DOA: 06/22/2019 PCP: Carol Ada, MD   Brief Narrative:  Patient is a 79 year old female with history of hypertension, hyperlipidemia, hypothyroidism, recent right ankle fracture in January of this year, neuropathy, vitamin B12 deficiency, CKD stage III who presented to the emergency department complaints of difficulty speaking, swallowing for 3 days.  On presentation she was hemodynamically stable.  CVA ruled out,  CT/MRI were unremarkable.  Myasthenia gravis suspected and was started on IVIG.  Also started Mestinon.  Assessment & Plan:   Principal Problem:   Generalized weakness Active Problems:   Benign essential HTN   Hypothyroidism   Hyperlipidemia   CKD (chronic kidney disease), stage III   Leucocytosis   Hypokalemia   Dysarthria   Generalized weakness/dysarthria/dysphagia/ptosis: Suspected MG worsened by Neurontin.  She completed course of IVIG for 5 days.  Also on Mestinon, prednisone.  No evidence of stroke as per CT/MRI.  Neurology following.  Vital capacity stable.  Neurontin on hold. MuSK antibodies, acetylcholine receptor antibodies pending. Speech therapy following.  Currently on tube feed,full liquid diet.  Will check with the speech therapy, if she can be started on solid diet.  Hypertension: Mildly hypertensive this morning.  Continue amlodipine.  Home ACE inhibitor, Lasix on hold  Hypokalemia: Resolved  Hyperlipidemia: On statin  Hypothyroidism: Continue Synthyroid  CKD stage IIIa: Currently stable renal function  Generalized weakness: PT evaluated and recommended home health PT.   Nutrition Problem: Inadequate oral intake Etiology: dysphagia      DVT prophylaxis: Lovenox  Code Status: Full Family Communication: None present at the bedside Disposition Plan: Patient is from home.  Anticipate discharge to home with home health after full work-up, clearance from neurology, resolution  of her MG symptoms   Consultants: Neurology  Procedures: None  Antimicrobials:  Anti-infectives (From admission, onward)   None      Subjective: Patient seen and examined the bedside this morning.  Hemodynamically stable.  Looks comfortable.  Wants to eat solid food.  Objective: Vitals:   06/28/19 2055 06/28/19 2300 06/29/19 0300 06/29/19 0754  BP: (!) 153/83 135/81 (!) 148/76 (!) 160/88  Pulse: 87 83 90 68  Resp: 18 16 16 18   Temp: 98.6 F (37 C) 98 F (36.7 C) 97.8 F (36.6 C) 97.6 F (36.4 C)  TempSrc:  Oral Oral Oral  SpO2: 98% 94% 97% 98%  Weight:      Height:        Intake/Output Summary (Last 24 hours) at 06/29/2019 0905 Last data filed at 06/29/2019 [id number] Gross per 24 hour  Intake 387.1 ml  Output 0 ml  Net 387.1 ml   Filed Weights   06/22/19 2046 06/23/19 0301 06/28/19 0328  Weight: 63 kg 63 kg 62.3 kg    Examination:  General exam: Appears calm and comfortable ,Not in distress,average built HEENT:PERRL,Oral mucosa moist, Ear/Nose normal on gross exam Respiratory system: Bilateral equal air entry, normal vesicular breath sounds, no wheezes or crackles  Cardiovascular system: S1 & S2 heard, RRR. No JVD, murmurs, rubs, gallops or clicks. No pedal edema. Gastrointestinal system: Abdomen is nondistended, soft and nontender. No organomegaly or masses felt. Normal bowel sounds heard. Central nervous system: Alert and oriented. No focal neurological deficits. Extremities: No edema, no clubbing ,no cyanosis Skin: No rashes, lesions or ulcers,no icterus ,no pallor     Data Reviewed: I have personally reviewed following labs and imaging studies  CBC: Recent Labs  Lab 06/22/19 2101 06/22/19  2101 06/22/19 2110 06/24/19 0126 06/26/19 0231 06/27/19 0226 06/28/19 0409  WBC 11.5*  --   --  19.8* 10.8* 8.9 7.4  NEUTROABS 7.7  --   --   --   --   --   --   HGB 15.2*   < > 12.2 15.1* 14.7 14.0 14.0  HCT 44.8   < > 36.0 44.5 43.5 41.5 41.9  MCV 88.0  --    --  89.0 89.0 88.7 90.5  PLT 380  --   --  393 333 286 280   < > = values in this interval not displayed.   Basic Metabolic Panel: Recent Labs  Lab 06/22/19 2101 06/22/19 2101 06/22/19 2110 06/22/19 2110 06/24/19 0126 06/25/19 0218 06/26/19 0231 06/28/19 0409 06/29/19 0541  NA 137   < > 140  --  141 143 141 136  --   K 3.9   < > 3.1*  --  4.6 3.2* 3.6 3.7  --   CL 101   < > 106  --  108 107 107 101  --   CO2 19*  --   --   --  19* 24 24 27   --   GLUCOSE 98   < > 85  --  146* 133* 139* 113*  --   BUN 17   < > 18  --  22 21 20 22   --   CREATININE 0.91   < > 0.50   < > 0.90 0.74 0.71 0.76 0.76  CALCIUM 9.2  --   --   --  9.7 9.4 9.6 9.1  --    < > = values in this interval not displayed.   GFR: Estimated Creatinine Clearance: 53 mL/min (by C-G formula based on SCr of 0.76 mg/dL). Liver Function Tests: Recent Labs  Lab 06/22/19 2101  AST 31  ALT 18  ALKPHOS 76  BILITOT 1.5*  PROT 7.3  ALBUMIN 4.3   No results for input(s): LIPASE, AMYLASE in the last 168 hours. No results for input(s): AMMONIA in the last 168 hours. Coagulation Profile: Recent Labs  Lab 06/22/19 2101  INR 1.0   Cardiac Enzymes: No results for input(s): CKTOTAL, CKMB, CKMBINDEX, TROPONINI in the last 168 hours. BNP (last 3 results) No results for input(s): PROBNP in the last 8760 hours. HbA1C: No results for input(s): HGBA1C in the last 72 hours. CBG: Recent Labs  Lab 06/28/19 1641 06/28/19 2135 06/29/19 0037 06/29/19 0359 06/29/19 0752  GLUCAP 141* 112* 123* 118* 118*   Lipid Profile: No results for input(s): CHOL, HDL, LDLCALC, TRIG, CHOLHDL, LDLDIRECT in the last 72 hours. Thyroid Function Tests: No results for input(s): TSH, T4TOTAL, FREET4, T3FREE, THYROIDAB in the last 72 hours. Anemia Panel: No results for input(s): VITAMINB12, FOLATE, FERRITIN, TIBC, IRON, RETICCTPCT in the last 72 hours. Sepsis Labs: No results for input(s): PROCALCITON, LATICACIDVEN in the last 168  hours.  Recent Results (from the past 240 hour(s))  SARS CORONAVIRUS 2 (TAT 6-24 HRS) Nasopharyngeal Nasopharyngeal Swab     Status: None   Collection Time: 06/22/19 10:59 PM   Specimen: Nasopharyngeal Swab  Result Value Ref Range Status   SARS Coronavirus 2 NEGATIVE NEGATIVE Final    Comment: (NOTE) SARS-CoV-2 target nucleic acids are NOT DETECTED. The SARS-CoV-2 RNA is generally detectable in upper and lower respiratory specimens during the acute phase of infection. Negative results do not preclude SARS-CoV-2 infection, do not rule out co-infections with other pathogens, and should not be used as  the sole basis for treatment or other patient management decisions. Negative results must be combined with clinical observations, patient history, and epidemiological information. The expected result is Negative. Fact Sheet for Patients: SugarRoll.be Fact Sheet for Healthcare Providers: https://www.woods-mathews.com/ This test is not yet approved or cleared by the [place] FDA and  has been authorized for detection and/or diagnosis of SARS-CoV-2 by FDA under an Emergency Use Authorization (EUA). This EUA will remain  in effect (meaning this test can be used) for the duration of the COVID-19 declaration under Section 56 4(b)(1) of the Act, 21 U.S.C. section 360bbb-3(b)(1), unless the authorization is terminated or revoked sooner. Performed at [facility], [place] [address] [place],  [address]          Radiology Studies: No results found.      Scheduled Meds: . amLODipine  5 mg Oral Daily  . aspirin EC  81 mg Oral QODAY  . atorvastatin  20 mg Per NG tube q1800  . enoxaparin (LOVENOX) injection  40 mg Subcutaneous Q24H  . levothyroxine  88 mcg Oral QAC breakfast  . predniSONE  20 mg Oral Q breakfast  . pyridostigmine  90 mg Oral Q4H while awake   Continuous Infusions: . feeding supplement (JEVITY 1.2 CAL) 1,000 mL  (06/25/19 1801)     LOS: 7 days    Time spent: 35 mins.More than 50% of that time was spent in counseling and/or coordination of care.      Shelly Coss, MD [facility] P3/22/2021, 9:05 AM

## 2019-06-29 NOTE — Progress Notes (Signed)
NIF -30 VC 1.10L

## 2019-06-29 NOTE — Progress Notes (Signed)
NIF -24 VC 1 L/1000 ml  Great effort.

## 2019-06-30 LAB — GLUCOSE, CAPILLARY
Glucose-Capillary: 100 mg/dL — ABNORMAL HIGH (ref 70–99)
Glucose-Capillary: 107 mg/dL — ABNORMAL HIGH (ref 70–99)
Glucose-Capillary: 116 mg/dL — ABNORMAL HIGH (ref 70–99)
Glucose-Capillary: 118 mg/dL — ABNORMAL HIGH (ref 70–99)
Glucose-Capillary: 88 mg/dL (ref 70–99)
Glucose-Capillary: 92 mg/dL (ref 70–99)
Glucose-Capillary: 98 mg/dL (ref 70–99)

## 2019-06-30 MED ORDER — ENSURE ENLIVE PO LIQD
237.0000 mL | Freq: Three times a day (TID) | ORAL | Status: DC
  Administered 2019-06-30 – 2019-07-04 (×10): 237 mL via ORAL

## 2019-06-30 NOTE — Progress Notes (Signed)
Initial Nutrition Assessment  DOCUMENTATION CODES:   Not applicable  INTERVENTION:   Ensure Enlive po TID, each supplement provides 350 kcal and 20 grams of protein  NUTRITION DIAGNOSIS:   Inadequate oral intake related to decreased appetite as evidenced by per patient/family report.  Ongoing   GOAL:   Patient will meet greater than or equal to 90% of their needs  Progressing  MONITOR:   PO intake, Supplement acceptance, Diet advancement, Labs  REASON FOR ASSESSMENT:   Malnutrition Screening Tool    ASSESSMENT:   79 yo female admitted with difficulty speaking and swallowing x 3 days. PMH includes HTN, HLD, hypothyroidism, recent R ankle fx, neuropathy, vitamin B-12 deficiency, CKD III.   Cortrak feeding tube was placed 3/17 and patient was receiving TF up until this morning when TF was stopped and Cortrak removed. Patient states she does not want another tube placed. She says she is not eating well and has a poor appetite. RD encouraged her to eat her meals and to try PO supplements.   SLP to re-evaluate for potential diet upgrade soon.  Labs reviewed.  CBGs: [id number] Medications reviewed.  Diet Order:   Diet Order            Diet full liquid Room service appropriate? Yes; Fluid consistency: Thin  Diet effective now              EDUCATION NEEDS:   Not appropriate for education at this time  Skin:  Skin Assessment: Reviewed RN Assessment  Last BM:  3/21 type 6  Height:   Ht Readings from Last 1 Encounters:  06/23/19 5\' 5"  (1.651 m)    Weight:   Wt Readings from Last 1 Encounters:  06/28/19 62.3 kg    Ideal Body Weight:  56.8 kg  BMI:  Body mass index is 22.86 kg/m.  Estimated Nutritional Needs:   Kcal:  1650-1850  Protein:  80-95 gm  Fluid:  1.7-1.9 L    Molli Barrows, RD, LDN, CNSC Please refer to Amion for contact information.

## 2019-06-30 NOTE — Progress Notes (Signed)
Physical Therapy Treatment Patient Details Name: Audrey Cowan MRN: [id number] DOB: Aug 22, 1940 Today's Date: 06/30/2019    History of Present Illness 79 year old female with fatigable dysphagia, ptosis, dysarthria following Neurontin administration.      PT Comments    Patient seen for mobility progression. Pt tolerated mobility well with VSS on RA. Pt continues to present with impaired balance, gait deviations, and with decreased safety awareness. Pt will need 24 hour assistance/supervision at home. Pt will continue to benefit from further skilled PT services to maximize independence and safety with mobility.     Follow Up Recommendations  Supervision/Assistance - 24 hour;Home health PT     Equipment Recommendations  None recommended by PT    Recommendations for Other Services       Precautions / Restrictions Precautions Precautions: Fall Restrictions Weight Bearing Restrictions: No    Mobility  Bed Mobility               General bed mobility comments: pt OOB in chair upon arrival  Transfers Overall transfer level: Needs assistance Equipment used: Rolling walker (2 wheeled) Transfers: Sit to/from Stand Sit to Stand: Min guard         General transfer comment: cues for safe hand placement as pt pulls on RW to stand  Ambulation/Gait Ambulation/Gait assistance: Min assist Gait Distance (Feet): 300 Feet Assistive device: Rolling walker (2 wheeled) Gait Pattern/deviations: Drifts right/left;Trunk flexed;Step-through pattern;Decreased stride length;Decreased step length - left;Decreased stance time - right Gait velocity: decreased   General Gait Details: assist to steady and manage AD; pt with decreased L step height wearing tennis shoes (pt prefers wearing shoes due to neuropathy); cues for maintaining safe proximity to RW and upright posture; pt taking single hand off of RW at times    Stairs             Wheelchair Mobility    Modified Rankin  (Stroke Patients Only)       Balance Overall balance assessment: Needs assistance Sitting-balance support: No upper extremity supported;Feet supported Sitting balance-Leahy Scale: Fair     Standing balance support: Bilateral upper extremity supported Standing balance-Leahy Scale: Poor                              Cognition Arousal/Alertness: Awake/alert Behavior During Therapy: WFL for tasks assessed/performed Overall Cognitive Status: No family/caregiver present to determine baseline cognitive functioning Area of Impairment: Safety/judgement                         Safety/Judgement: Decreased awareness of safety;Decreased awareness of deficits     General Comments: easily distracted; decreased safety awareness      Exercises Other Exercises Other Exercises: Walk 300 ft    General Comments General comments (skin integrity, edema, etc.): VSS      Pertinent Vitals/Pain Pain Assessment: Faces Faces Pain Scale: Hurts a little bit Pain Location: R ankle Pain Descriptors / Indicators: Discomfort;Guarding Pain Intervention(s): Monitored during session    Home Living Family/patient expects to be discharged to:: Private residence Living Arrangements: Children Available Help at Discharge: Family;Available PRN/intermittently Type of Home: House Home Access: Level entry   Home Layout: One level Home Equipment: Cane - single point;Crutches;Walker - 2 wheels;Wheelchair - manual;Hand held shower head;Shower seat      Prior Function Level of Independence: Independent with assistive device(s)      Comments: used RW PTA, sink bathes, did shower if someone  was there   PT Goals (current goals can now be found in the care plan section) Acute Rehab PT Goals Patient Stated Goal: To wash her hair Progress towards PT goals: Progressing toward goals    Frequency    Min 3X/week      PT Plan Current plan remains appropriate    Co-evaluation               AM-PAC PT "6 Clicks" Mobility   Outcome Measure  Help needed turning from your back to your side while in a flat bed without using bedrails?: None Help needed moving from lying on your back to sitting on the side of a flat bed without using bedrails?: None Help needed moving to and from a bed to a chair (including a wheelchair)?: A Little Help needed standing up from a chair using your arms (e.g., wheelchair or bedside chair)?: A Little Help needed to walk in hospital room?: A Little Help needed climbing 3-5 steps with a railing? : A Lot 6 Click Score: 19    End of Session Equipment Utilized During Treatment: Gait belt Activity Tolerance: Patient tolerated treatment well Patient left: in chair;with chair alarm set;with call bell/phone within reach Nurse Communication: Mobility status PT Visit Diagnosis: Unsteadiness on feet (R26.81);Muscle weakness (generalized) (M62.81)     Time: [id number] PT Time Calculation (min) (ACUTE ONLY): 37 min  Charges:  $Gait Training: 23-37 mins                     Earney Navy, PTA Acute Rehabilitation Services Pager: [phone] Office: [phone]     Darliss Cheney 06/30/2019, 5:15 PM

## 2019-06-30 NOTE — Progress Notes (Signed)
PROGRESS NOTE    Audrey Cowan  [id number] DOB: 11/04/1940 DOA: 06/22/2019 PCP: Carol Ada, MD   Brief Narrative:  Patient is a 79 year old female with history of hypertension, hyperlipidemia, hypothyroidism, recent right ankle fracture in January of this year, neuropathy, vitamin B12 deficiency, CKD stage III who presented to the emergency department complaints of difficulty speaking, swallowing for 3 days.  On presentation she was hemodynamically stable.  CVA ruled out,  CT/MRI were unremarkable.  Myasthenia gravis suspected and was started on IVIG.  Also started Mestinon,prednisone.Feeding  tube removed today.  Assessment & Plan:   Principal Problem:   Generalized weakness Active Problems:   Benign essential HTN   Hypothyroidism   Hyperlipidemia   CKD (chronic kidney disease), stage III   Leucocytosis   Hypokalemia   Dysarthria   Generalized weakness/dysarthria/dysphagia/ptosis: Suspected MG worsened by Neurontin.  She completed course of IVIG for 5 days.  Also on Mestinon, prednisone.  No evidence of stroke as per CT/MRI.  Neurology following.  Vital capacity stable.  Neurontin on hold. MuSK antibodies, acetylcholine receptor antibodies pending. Speech therapy following.  She was  on tube feed,full liquid diet. Feeding tube was found to be clogged and she does not want it anymore.  Feeding tube removed today.   Will check with the speech therapy, if she can be started on solid diet.   Hypertension:Moniotr BP.  Continue amlodipine.  Home ACE inhibitor, Lasix on hold  Hypokalemia: Resolved  Hyperlipidemia: On statin  Hypothyroidism: Continue Synthyroid  CKD stage IIIa: Currently stable renal function  Generalized weakness: PT evaluated and recommended home health PT.   Nutrition Problem: Inadequate oral intake Etiology: dysphagia      DVT prophylaxis: Lovenox  Code Status: Full Family Communication: Called son for update on 06/30/19.Call not  received Disposition Plan: Patient is from home.  Anticipate discharge to home with home health after full work-up, clearance from neurology, resolution of her MG symptoms   Consultants: Neurology  Procedures: None  Antimicrobials:  Anti-infectives (From admission, onward)   None      Subjective: Patient seen and examined the bedside this morning.  Sitting on the chair.  Looks comfortable.  Still has some degree of dysarthria but no weakness, ptosis.  She does not want the feeding tube and requested for the removal.  She is tolerating full liquid diet.  She is very eager to go home and wondering how long will take to figure out what is going on  Objective: Vitals:   06/29/19 2355 06/30/19 0415 06/30/19 0741 06/30/19 0748  BP: 117/69 140/79  (!) 144/76  Pulse: 71 69 72 70  Resp: (!) 21 19 18 19   Temp: 97.7 F (36.5 C) 98.7 F (37.1 C)  98.1 F (36.7 C)  TempSrc: Oral Oral  Oral  SpO2:      Weight:      Height:        Intake/Output Summary (Last 24 hours) at 06/30/2019 0805 Last data filed at 06/30/2019 0700 Gross per 24 hour  Intake 1755.1 ml  Output 450 ml  Net 1305.1 ml   Filed Weights   06/22/19 2046 06/23/19 0301 06/28/19 0328  Weight: 63 kg 63 kg 62.3 kg    Examination:   General exam: Appears calm and comfortable ,Not in distress,average built HEENT:PERRL,Oral mucosa moist, Ear/Nose normal on gross exam Respiratory system: Bilateral equal air entry, normal vesicular breath sounds, no wheezes or crackles  Cardiovascular system: S1 & S2 heard, RRR. No JVD, murmurs, rubs, gallops  or clicks. Gastrointestinal system: Abdomen is nondistended, soft and nontender. No organomegaly or masses felt. Normal bowel sounds heard. Central nervous system: Alert and oriented. No focal neurological deficits.Dysarthria Extremities: No edema, no clubbing ,no cyanosisSkin: No rashes, lesions or ulcers,no icterus ,no pallor       Data Reviewed: I have personally reviewed  following labs and imaging studies  CBC: Recent Labs  Lab 06/24/19 0126 06/26/19 0231 06/27/19 0226 06/28/19 0409  WBC 19.8* 10.8* 8.9 7.4  HGB 15.1* 14.7 14.0 14.0  HCT 44.5 43.5 41.5 41.9  MCV 89.0 89.0 88.7 90.5  PLT 393 333 286 [id number]   Basic Metabolic Panel: Recent Labs  Lab 06/24/19 0126 06/25/19 0218 06/26/19 0231 06/28/19 0409 06/29/19 0541  NA 141 143 141 136  --   K 4.6 3.2* 3.6 3.7  --   CL 108 107 107 101  --   CO2 19* 24 24 27   --   GLUCOSE 146* 133* 139* 113*  --   BUN 22 21 20 22   --   CREATININE 0.90 0.74 0.71 0.76 0.76  CALCIUM 9.7 9.4 9.6 9.1  --    GFR: Estimated Creatinine Clearance: 53 mL/min (by C-G formula based on SCr of 0.76 mg/dL). Liver Function Tests: No results for input(s): AST, ALT, ALKPHOS, BILITOT, PROT, ALBUMIN in the last 168 hours. No results for input(s): LIPASE, AMYLASE in the last 168 hours. No results for input(s): AMMONIA in the last 168 hours. Coagulation Profile: No results for input(s): INR, PROTIME in the last 168 hours. Cardiac Enzymes: No results for input(s): CKTOTAL, CKMB, CKMBINDEX, TROPONINI in the last 168 hours. BNP (last 3 results) No results for input(s): PROBNP in the last 8760 hours. HbA1C: No results for input(s): HGBA1C in the last 72 hours. CBG: Recent Labs  Lab 06/29/19 1608 06/29/19 2045 06/29/19 2358 06/30/19 0408 06/30/19 0745  GLUCAP 93 103* 118* 107* 88   Lipid Profile: No results for input(s): CHOL, HDL, LDLCALC, TRIG, CHOLHDL, LDLDIRECT in the last 72 hours. Thyroid Function Tests: No results for input(s): TSH, T4TOTAL, FREET4, T3FREE, THYROIDAB in the last 72 hours. Anemia Panel: No results for input(s): VITAMINB12, FOLATE, FERRITIN, TIBC, IRON, RETICCTPCT in the last 72 hours. Sepsis Labs: No results for input(s): PROCALCITON, LATICACIDVEN in the last 168 hours.  Recent Results (from the past 240 hour(s))  SARS CORONAVIRUS 2 (TAT 6-24 HRS) Nasopharyngeal Nasopharyngeal Swab     Status:  None   Collection Time: 06/22/19 10:59 PM   Specimen: Nasopharyngeal Swab  Result Value Ref Range Status   SARS Coronavirus 2 NEGATIVE NEGATIVE Final    Comment: (NOTE) SARS-CoV-2 target nucleic acids are NOT DETECTED. The SARS-CoV-2 RNA is generally detectable in upper and lower respiratory specimens during the acute phase of infection. Negative results do not preclude SARS-CoV-2 infection, do not rule out co-infections with other pathogens, and should not be used as the sole basis for treatment or other patient management decisions. Negative results must be combined with clinical observations, patient history, and epidemiological information. The expected result is Negative. Fact Sheet for Patients: SugarRoll.be Fact Sheet for Healthcare Providers: https://www.woods-mathews.com/ This test is not yet approved or cleared by the [place] FDA and  has been authorized for detection and/or diagnosis of SARS-CoV-2 by FDA under an Emergency Use Authorization (EUA). This EUA will remain  in effect (meaning this test can be used) for the duration of the COVID-19 declaration under Section 56 4(b)(1) of the Act, 21 U.S.C. section 360bbb-3(b)(1), unless the authorization is  terminated or revoked sooner. Performed at [facility], [place] [address], [place], [place] [address]          Radiology Studies: No results found.      Scheduled Meds: . amLODipine  10 mg Oral Daily  . aspirin EC  81 mg Oral QODAY  . atorvastatin  20 mg Per NG tube q1800  . enoxaparin (LOVENOX) injection  40 mg Subcutaneous Q24H  . levothyroxine  88 mcg Oral QAC breakfast  . predniSONE  20 mg Oral Q breakfast  . pyridostigmine  90 mg Oral Q4H while awake   Continuous Infusions: . feeding supplement (JEVITY 1.2 CAL) 60 mL/hr at 06/29/19 2355     LOS: 8 days    Time spent: 35 mins.More than 50% of that time was spent in counseling and/or coordination of  care.      Shelly Coss, MD [facility] P3/23/2021, 8:05 AM

## 2019-06-30 NOTE — Progress Notes (Signed)
NIF Greater than (-40) strong effort x 2 VC  (1L) strong effort
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
## 2019-06-30 NOTE — Progress Notes (Signed)
NIF -38 VC 1L

## 2019-06-30 NOTE — Progress Notes (Signed)
Neurology Progress Note   S:// Seen and examined.  Reports no acute changes.  Reports feeling better in terms of breathing but not so much in her dysarthria.  Feeding tube was removed yesterday.   O:// Current vital signs: BP 134/76 (BP Location: Left Arm)   Pulse 76   Temp 98.7 F (37.1 C) (Oral)   Resp (!) 25   Ht 5\' 5"  (1.651 m)   Wt 62.3 kg   SpO2 99%   BMI 22.86 kg/m  Vital signs in last 24 hours: Temp:  [97.5 F (36.4 C)-98.7 F (37.1 C)] 98.7 F (37.1 C) (03/23 1240) Pulse Rate:  [69-76] 76 (03/23 1240) Resp:  [18-25] 25 (03/23 1240) BP: (117-162)/(69-95) 134/76 (03/23 1240) SpO2:  [97 %-99 %] 99 % (03/23 1240) FiO2 (%):  [94 %] 94 % (03/23 0741) General: Awake alert in no distress Respiratory: Nonlabored breathing Abdomen: Soft nondistended nontender Cardiovascular: Regular rate rhythm Neurological exam Awake alert oriented x3. Speech is not dysarthric No evidence of aphasia Cranial nerve exam: Did not see any ptosis, no ptosis with sustained upward gaze.  Mildly fatigable dysarthria.  Based on chart review and subjectively by the patient that is better than prior days. Motor exam: 4+/5 strength in arms and legs with the exception of more proximal 4/5 strength bilaterally and that is fatigable.  4-4+/5 next flexion. Sensory: Intact Coordination: No dysmetria Exam remained unchanged from yesterday.  Medications  Current Facility-Administered Medications:  .  acetaminophen (TYLENOL) tablet 650 mg, 650 mg, Oral, Q6H PRN **OR** acetaminophen (TYLENOL) suppository 650 mg, 650 mg, Rectal, Q6H PRN, Jonelle Sidle, Mohammad L, MD .  amLODipine (NORVASC) tablet 10 mg, 10 mg, Oral, Daily, Adhikari, Amrit, MD, 10 mg at 06/30/19 0803 .  aspirin EC tablet 81 mg, 81 mg, Oral, QODAY, Kc, Ramesh, MD, 81 mg at 06/30/19 0804 .  atorvastatin (LIPITOR) tablet 20 mg, 20 mg, Per NG tube, q1800, Kc, Ramesh, MD, 20 mg at 06/29/19 1711 .  enoxaparin (LOVENOX) injection 40 mg, 40 mg,  Subcutaneous, Q24H, Garba, Mohammad L, MD, 40 mg at 06/29/19 1147 .  feeding supplement (JEVITY 1.2 CAL) liquid 1,000 mL, 1,000 mL, Per Tube, Continuous, Kc, Ramesh, MD, Last Rate: 60 mL/hr at 06/29/19 2355, Rate Verify at 06/29/19 2355 .  hydrALAZINE (APRESOLINE) injection 5 mg, 5 mg, Intravenous, Q4H PRN, Kirby-Graham, Karsten Fells, NP, 5 mg at 06/23/19 2333 .  levothyroxine (SYNTHROID) tablet 88 mcg, 88 mcg, Oral, QAC breakfast, Jonelle Sidle, Mohammad L, MD, 88 mcg at 06/30/19 0700 .  lip balm (CARMEX) ointment 1 application, 1 application, Topical, PRN, Kc, Ramesh, MD .  ondansetron (ZOFRAN) tablet 4 mg, 4 mg, Oral, Q6H PRN **OR** ondansetron (ZOFRAN) injection 4 mg, 4 mg, Intravenous, Q6H PRN, Garba, Mohammad L, MD .  predniSONE (DELTASONE) tablet 20 mg, 20 mg, Oral, Q breakfast, Greta Doom, MD, 20 mg at 06/30/19 0804 .  pyridostigmine (MESTINON) tablet 90 mg, 90 mg, Oral, Q4H while awake, Greta Doom, MD, 90 mg at 06/30/19 0954 Labs No new labs to review AC HR antibodies and MuSK antibodies pending  Imaging No new imaging to review  Assessment: 79 year old woman presented with symptoms of dysphagia dysarthria ptosis following initiation of Neurontin.  Clinically exam consistent with myasthenia gravis.  Also Neurontin is unknown exacerbator of myasthenic symptoms. Likely underlying disease masked by gabapentin initiation. Has had low negative inspiratory flows which are gradually improving. Myasthenia labs remain pending.  Awaiting call from the lab regarding the timing of test availability.  If  labs are positive for myasthenia, would consider IVIG or possibly even plasma exchange but if it remains negative, would want to have a definitive diagnosis by single-fiber EMG and rep stim for which she might require transfer to a academic institution.  Recommendations: Completed 5 days of IVIG Continue Mestinon 90 every 4. Continue prednisone 20 mg daily Will touch base with  neuromuscular specialist for further discussion. PT OT We will follow  Spoke with the son Leroy Sea over the phone.  -- Amie Portland, MD [facility] Pager: [phone] If 7pm to 7am, please call on call as listed on AMION.

## 2019-07-01 ENCOUNTER — Inpatient Hospital Stay (HOSPITAL_COMMUNITY): Payer: Medicare Other

## 2019-07-01 LAB — GLUCOSE, CAPILLARY
Glucose-Capillary: 95 mg/dL (ref 70–99)
Glucose-Capillary: 130 mg/dL — ABNORMAL HIGH (ref 70–99)
Glucose-Capillary: 108 mg/dL — ABNORMAL HIGH (ref 70–99)
Glucose-Capillary: 95 mg/dL (ref 70–99)

## 2019-07-01 LAB — MUSK ANTIBODIES: MuSK Antibodies: <1 U/mL

## 2019-07-01 IMAGING — CT CT ABD-PELV W/ CM
2 of 7 series · 14 of 46 positions shown, 16 images · IV contrast (omnipaque)
Comparison: [DATE]

CLINICAL DATA: Suspected pneumonia or abscess.

EXAM:
CT CHEST, ABDOMEN, AND PELVIS WITH CONTRAST
TECHNIQUE: Multidetector CT imaging of the chest, abdomen and pelvis was
performed following the standard protocol during bolus
administration of intravenous contrast.
CONTRAST:  100mL OMNIPAQUE IOHEXOL 300 MG/ML  SOLN

[Series 4: thins · axial · 0.75mm/px · z∈[+927,+1475]mm · 11 of 881 slices shown, 13 images]
[im 49/881  soft-tissue]
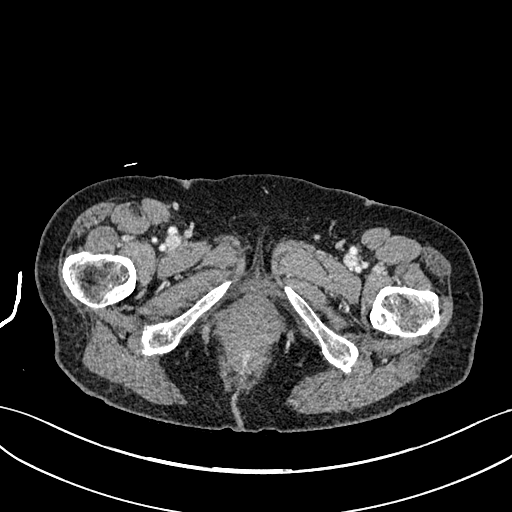
[im 49/881  bone]
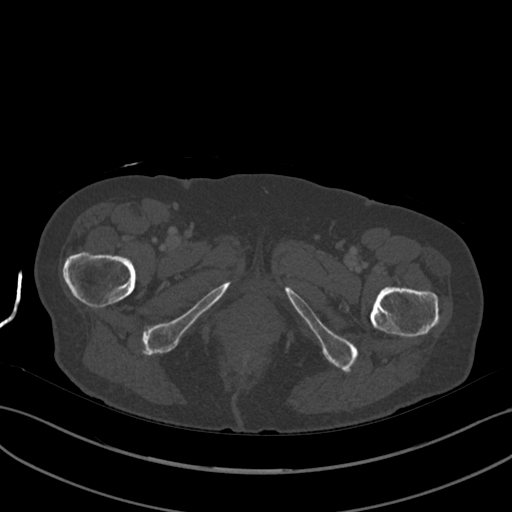
[im 147/881  soft-tissue]
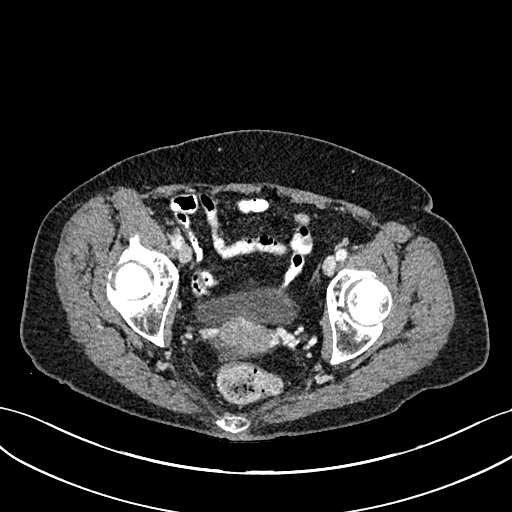
[im 196/881  soft-tissue]
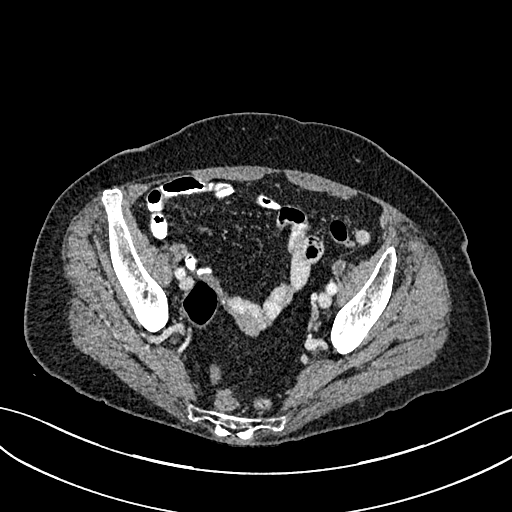
[im 294/881  soft-tissue]
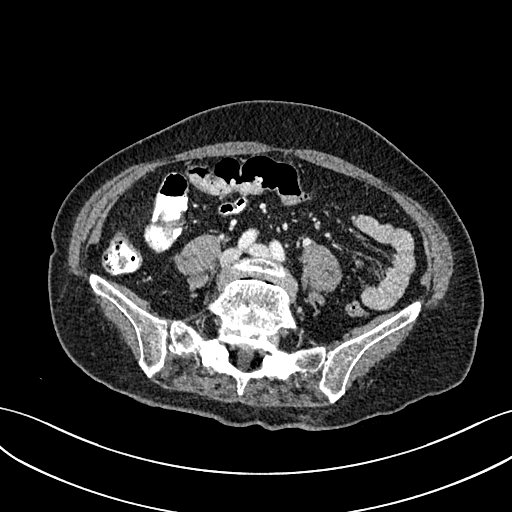
[im 343/881  soft-tissue]
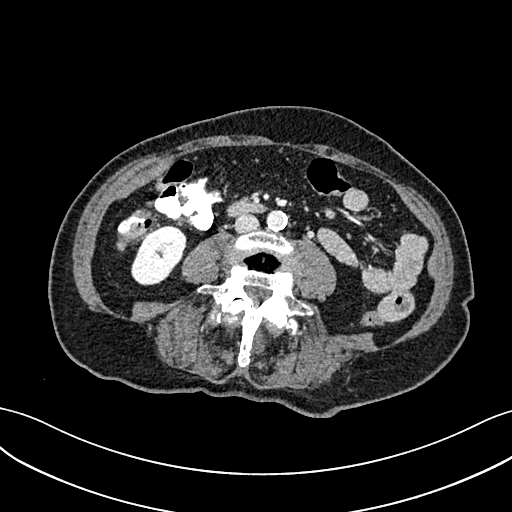
[im 441/881  soft-tissue]
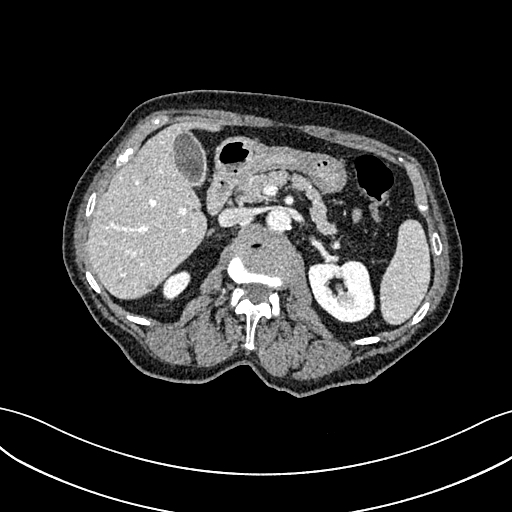
[im 538/881  soft-tissue]
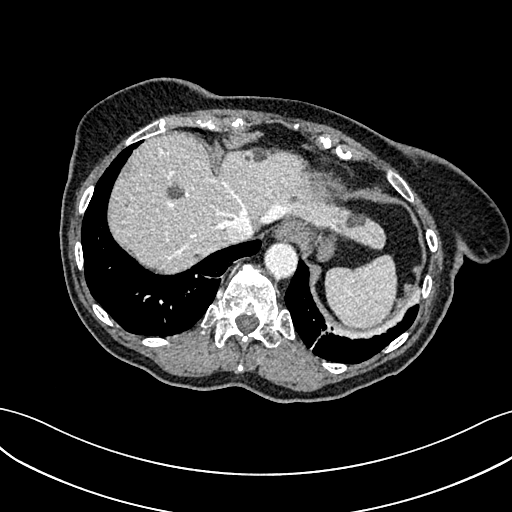
[im 587/881  soft-tissue]
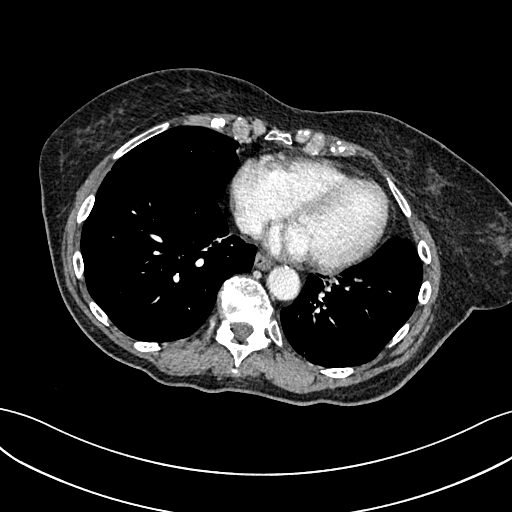
[im 685/881  soft-tissue]
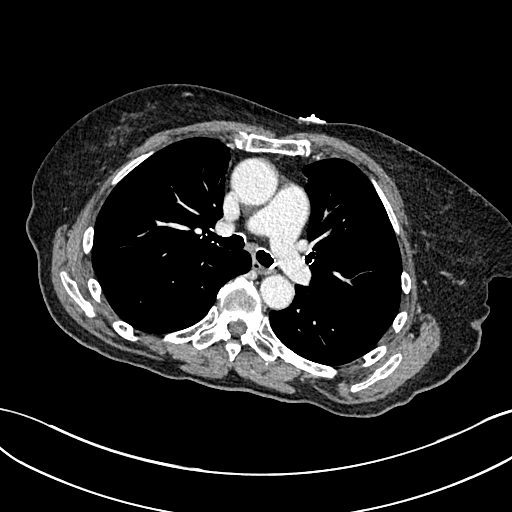
[im 685/881  bone]
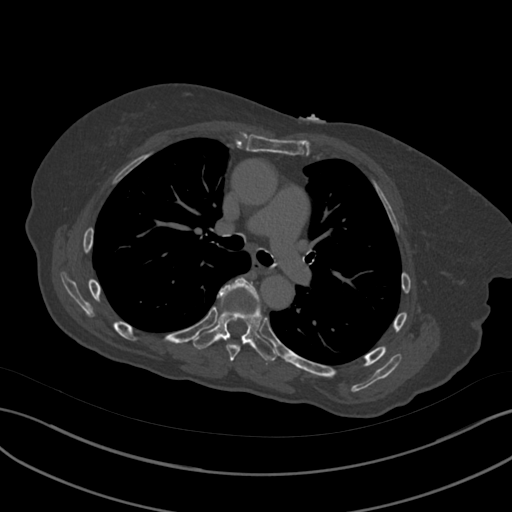
[im 734/881  soft-tissue]
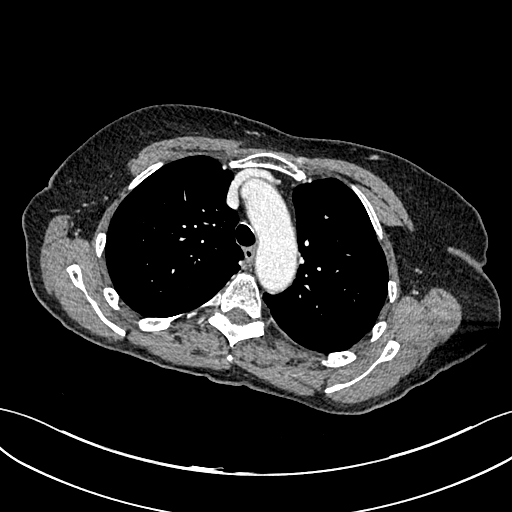
[im 832/881  soft-tissue]
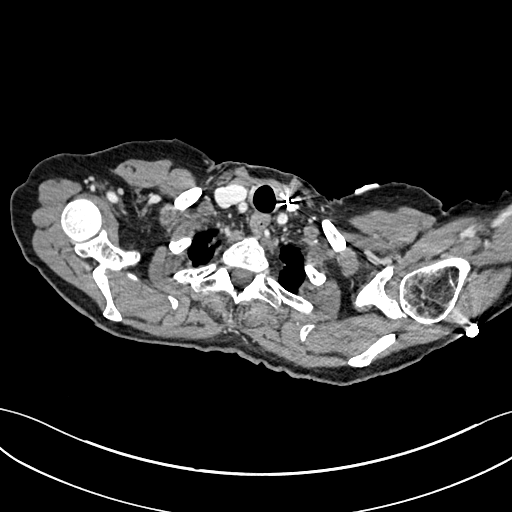

[Series 6: cor · coronal · 0.75mm/px · 3 of 94 slices shown]
[im 32/94  soft-tissue]
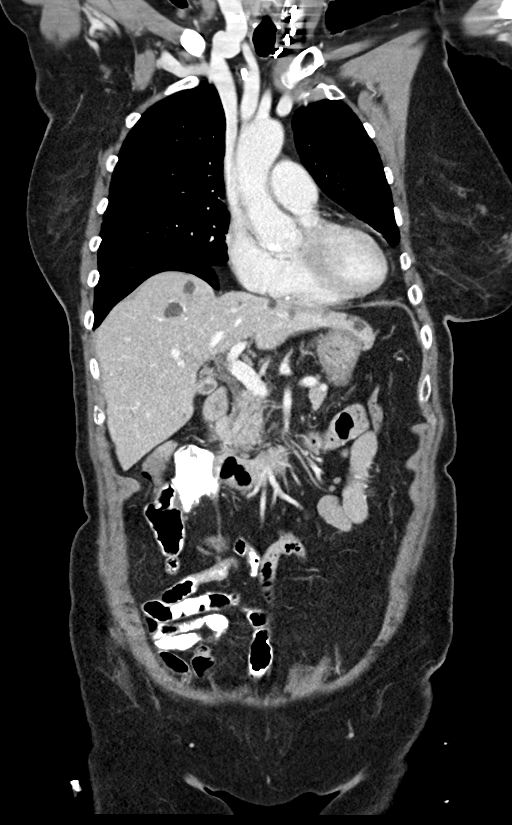
[im 42/94  soft-tissue]
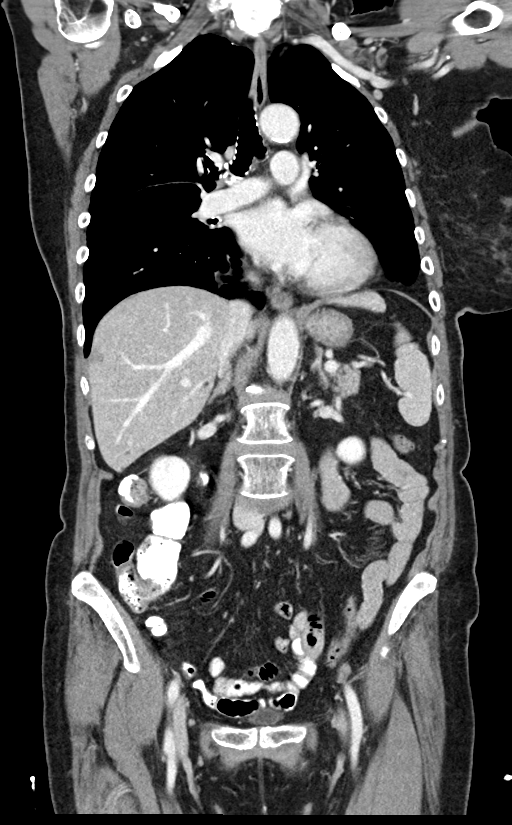
[im 52/94  soft-tissue]
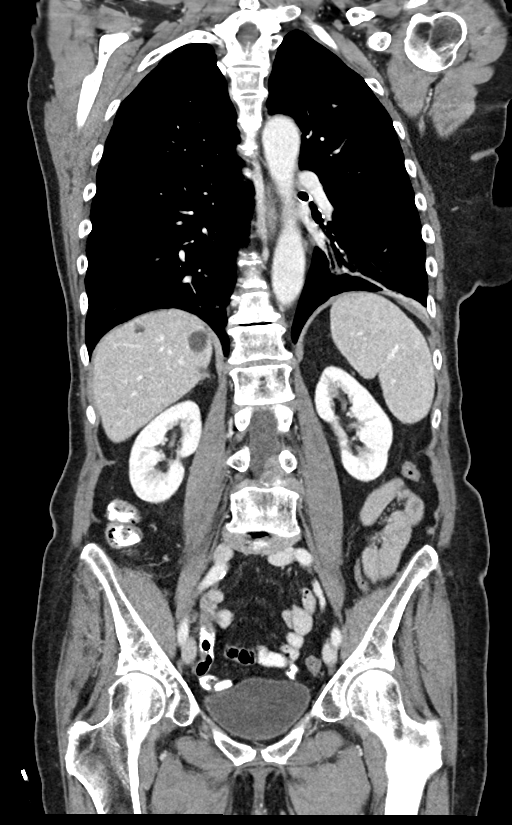

[14 of 46 positions shown; findings below may reference images not displayed]

FINDINGS: CT CHEST FINDINGS

Cardiovascular: There is mild calcification of the aortic arch and
descending thoracic aorta. Normal heart size. No pericardial
effusion.

Mediastinum/Nodes: No enlarged mediastinal, hilar, or axillary lymph
nodes. Multiple surgical clips are seen within the expected regions
of the right and left lobes of the thyroid gland.

Lungs/Pleura: An ill-defined 4 mm noncalcified lung nodule is seen
within the anterolateral aspect of the right lower lobe (axial CT
image 90, CT series number 5).

A 3 mm noncalcified lung nodule is seen within the lateral aspect of
the left lower lobe (axial CT image 103, CT series number 5).

Mild linear scarring and/or atelectasis is seen within the bilateral
lung bases.

Musculoskeletal: Degenerative changes seen within the thoracic
spine.

CT ABDOMEN PELVIS FINDINGS

Hepatobiliary: Innumerable cysts of various sizes are seen scattered
throughout the right and left lobes of the liver. No gallstones,
gallbladder wall thickening, or biliary dilatation.

Pancreas: Unremarkable. No pancreatic ductal dilatation or
surrounding inflammatory changes.

Spleen: Normal in size without focal abnormality.

Adrenals/Urinary Tract: Adrenal glands are unremarkable. Kidneys are
normal in size, without renal calculi or hydronephrosis. A
subcentimeter cyst is seen within the anterior aspect of the mid
right kidney. A similar appearing subcentimeter cyst is noted within
the posterolateral aspect of the mid left kidney. Bladder is
unremarkable.

Stomach/Bowel: Stomach is within normal limits. The appendix is not
identified. No evidence of bowel wall thickening, distention, or
inflammatory changes.

Vascular/Lymphatic: There is moderate severity aortic calcification.
No enlarged abdominal or pelvic lymph nodes.

Reproductive: Uterus and bilateral adnexa are unremarkable.

Other: No abdominal wall hernia or abnormality. No abdominopelvic
ascites.

Musculoskeletal: Degenerative changes seen throughout the lumbar
spine.
IMPRESSION: 1. Subcentimeter noncalcified bilateral lower lobe lung nodules. No
follow-up needed if patient is low-risk (and has no known or
suspected primary neoplasm). Non-contrast chest CT can be considered
in 12 months if patient is high-risk. This recommendation follows
the consensus statement: Guidelines for Management of Incidental
Pulmonary Nodules Detected on CT Images: From the [HOSPITAL]
2. Mild bibasilar linear scarring and/or atelectasis.
3. Innumerable hepatic cysts.

Aortic Atherosclerosis ([EX]-[EX]).

## 2019-07-01 IMAGING — CT CT CHEST W/ CM
2 of 7 series · 14 of 46 positions shown, 16 images · IV contrast (omnipaque)
Comparison: None.

CLINICAL DATA: Suspected pneumonia or abscess.

EXAM:
CT CHEST, ABDOMEN, AND PELVIS WITH CONTRAST
TECHNIQUE: Multidetector CT imaging of the chest, abdomen and pelvis was
performed following the standard protocol during bolus
administration of intravenous contrast.
CONTRAST:  100mL OMNIPAQUE IOHEXOL 300 MG/ML  SOLN

[Series 4: thins · axial · 0.75mm/px · z∈[+927,+1475]mm · 11 of 881 slices shown, 13 images]
[im 49/881  soft-tissue]
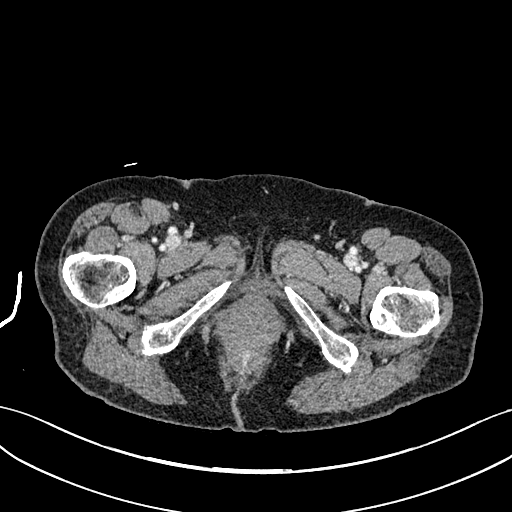
[im 49/881  bone]
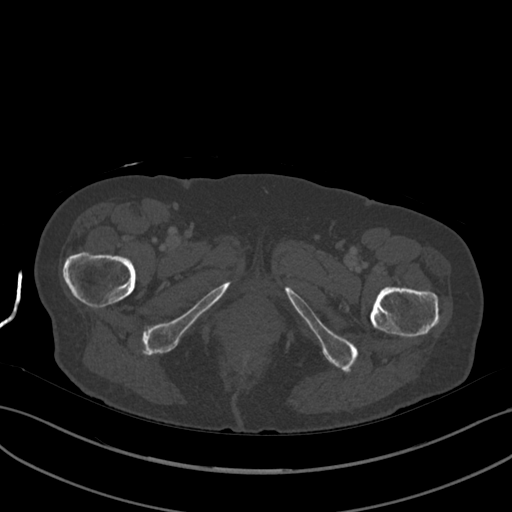
[im 147/881  soft-tissue]
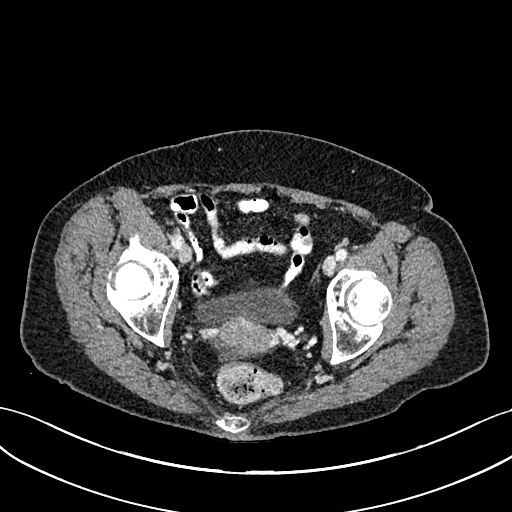
[im 196/881  soft-tissue]
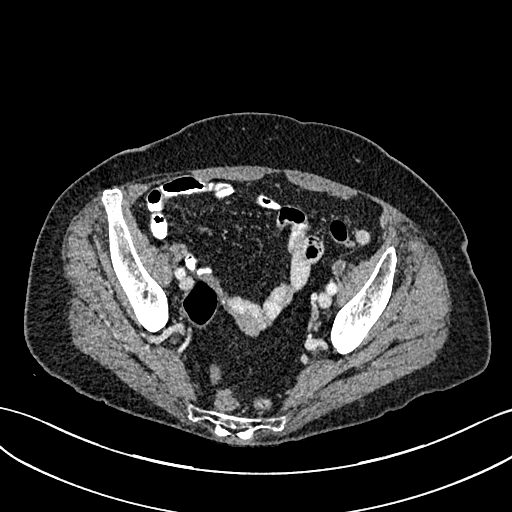
[im 294/881  soft-tissue]
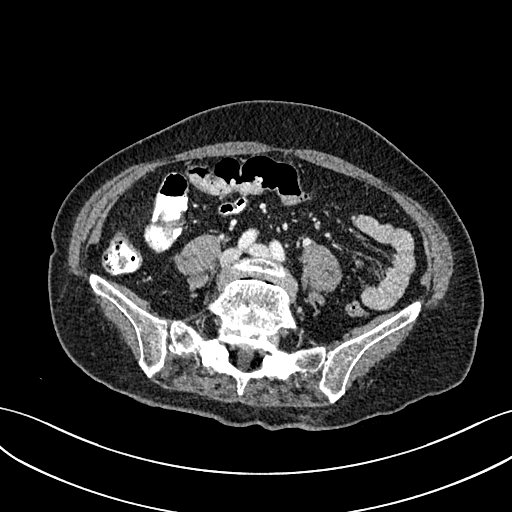
[im 343/881  soft-tissue]
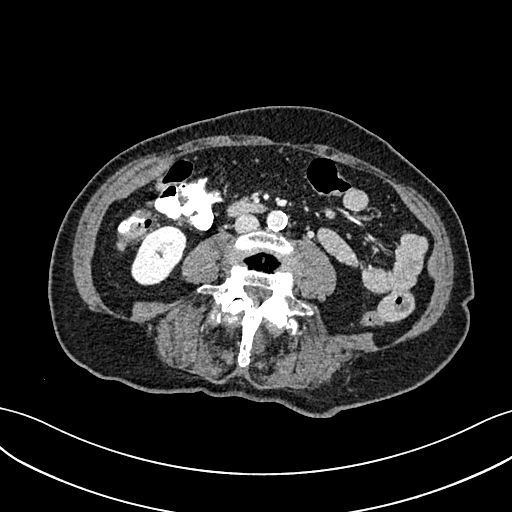
[im 441/881  soft-tissue]
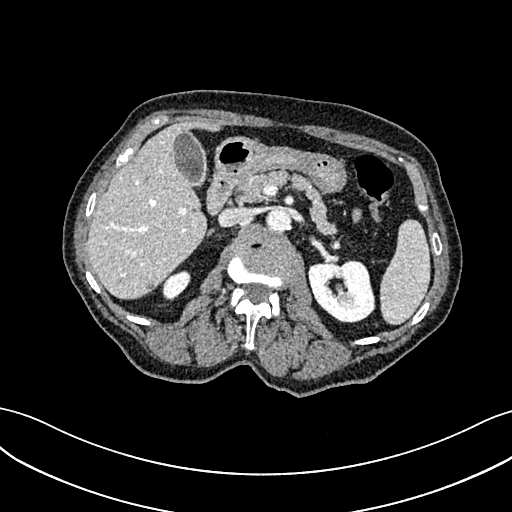
[im 538/881  soft-tissue]
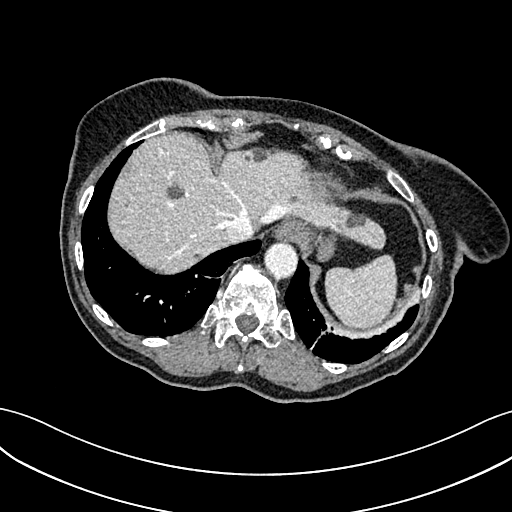
[im 587/881  soft-tissue]
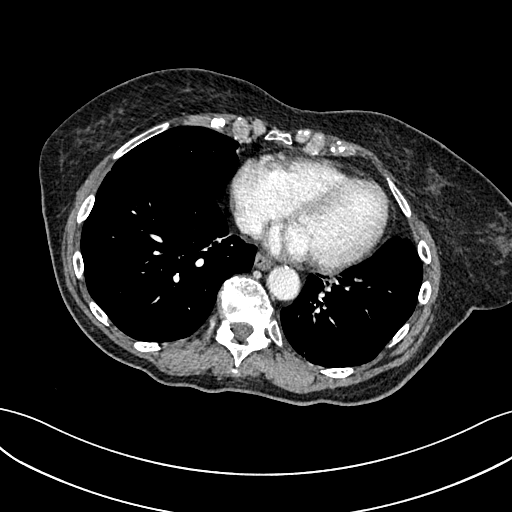
[im 685/881  soft-tissue]
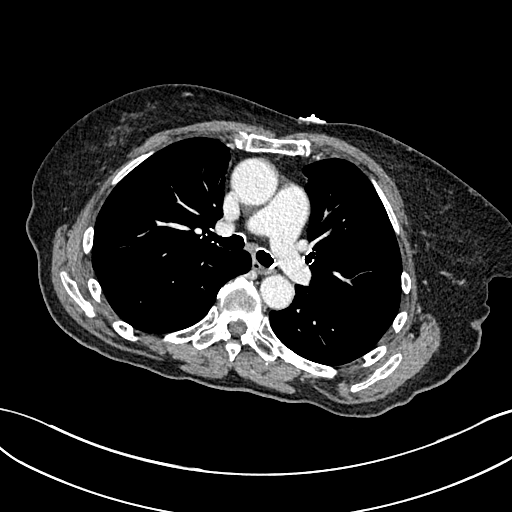
[im 685/881  bone]
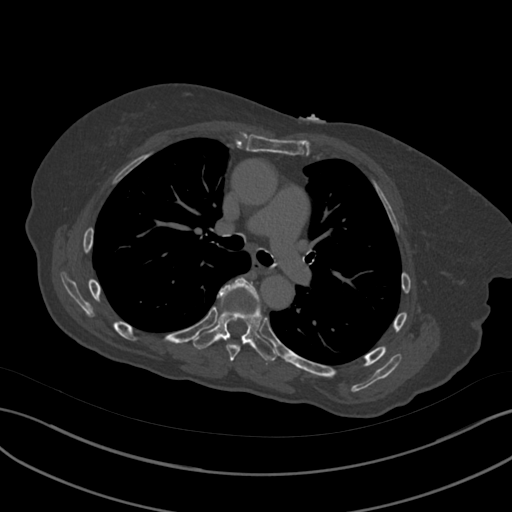
[im 734/881  soft-tissue]
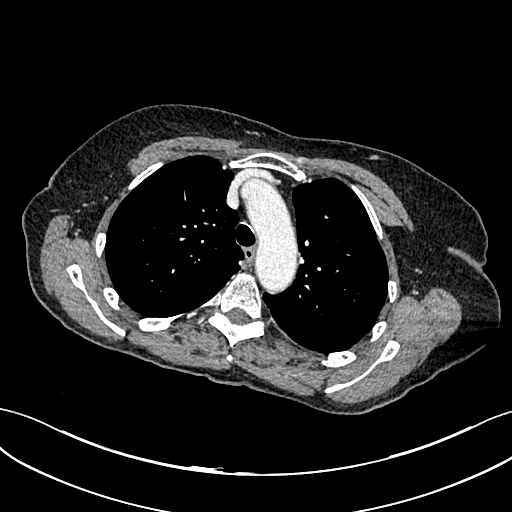
[im 832/881  soft-tissue]
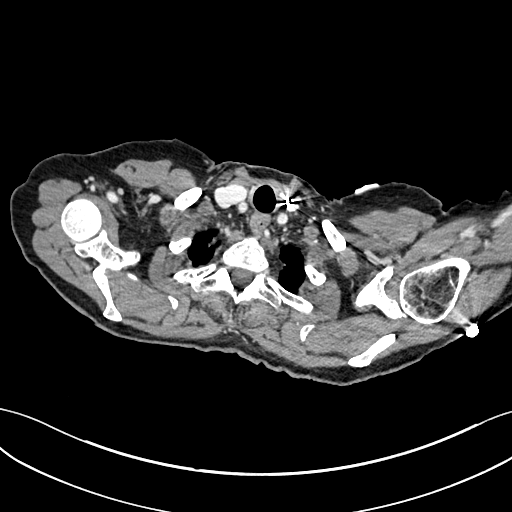

[Series 6: cor · coronal · 0.75mm/px · 3 of 94 slices shown]
[im 32/94  soft-tissue]
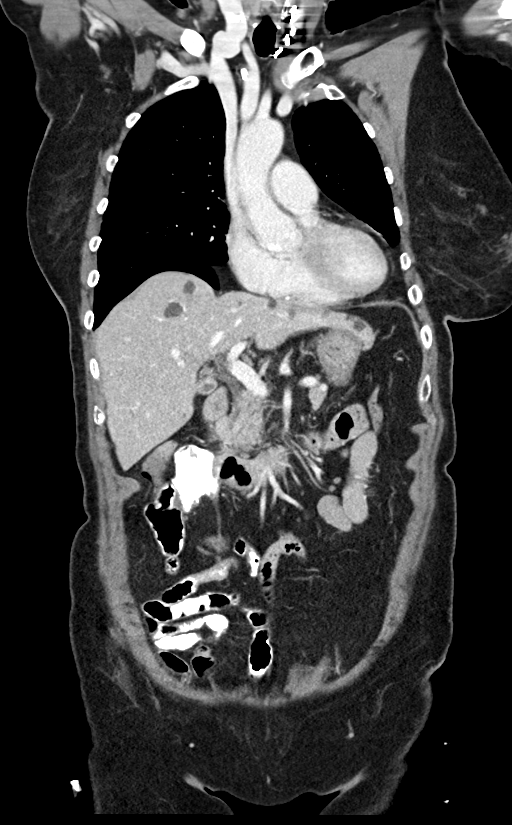
[im 42/94  soft-tissue]
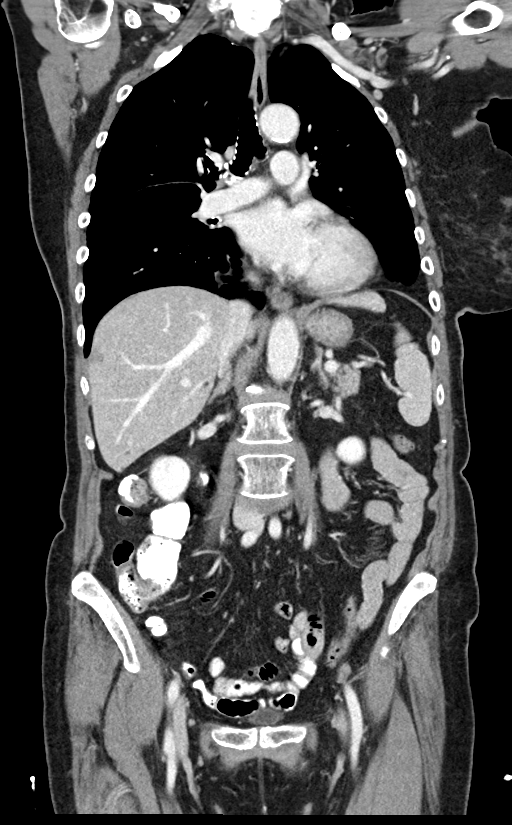
[im 52/94  soft-tissue]
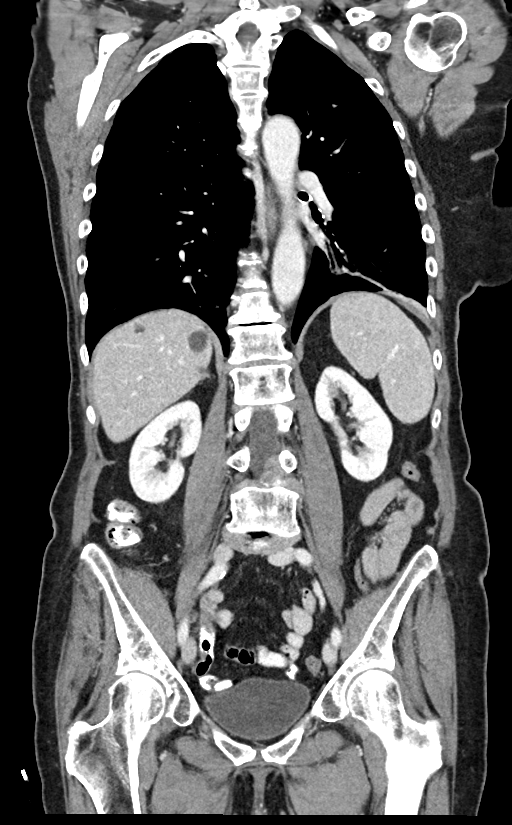

[14 of 46 positions shown; findings below may reference images not displayed]

FINDINGS: Cardiovascular: There is mild calcification of the aortic arch and
descending thoracic aorta. Normal heart size. No pericardial
effusion.

Mediastinum/Nodes: No enlarged mediastinal, hilar, or axillary lymph
nodes. Multiple surgical clips are seen within the expected regions
of the right and left lobes of the thyroid gland.

Lungs/Pleura: An ill-defined 4 mm noncalcified lung nodule is seen
within the anterolateral aspect of the right lower lobe (axial CT
image 90, CT series number 5). A 3 mm noncalcified lung nodule is
seen within the lateral aspect of the left lower lobe (axial CT
image 103, CT series number 5). Mild linear scarring and/or
atelectasis is seen within the bilateral lung bases.

Musculoskeletal: Degenerative changes seen within the thoracic
spine.

CT ABDOMEN PELVIS FINDINGS

Hepatobiliary: Innumerable cysts of various sizes are seen scattered
throughout the right and left lobes of the liver. No gallstones,
gallbladder wall thickening, or biliary dilatation.

Pancreas: Unremarkable. No pancreatic ductal dilatation or
surrounding inflammatory changes.

Spleen: Normal in size without focal abnormality.

Adrenals/Urinary Tract: Adrenal glands are unremarkable. Kidneys are
normal in size, without renal calculi or hydronephrosis. A
subcentimeter cyst is seen within the anterior aspect of the mid
right kidney. A similar appearing subcentimeter cyst is noted within
the posterolateral aspect of the mid left kidney. Bladder is
unremarkable.

Stomach/Bowel: Stomach is within normal limits. The appendix is not
identified. No evidence of bowel wall thickening, distention, or
inflammatory changes.

Vascular/Lymphatic: There is moderate severity aortic calcification.
No enlarged abdominal or pelvic lymph nodes.

Reproductive: Uterus and bilateral adnexa are unremarkable.

Other: No abdominal wall hernia or abnormality. No abdominopelvic
ascites.

Musculoskeletal: Degenerative changes seen throughout the lumbar
spine.
IMPRESSION: 1. Subcentimeter noncalcified bilateral lower lobe lung nodules. No
follow-up needed if patient is low-risk (and has no known or
suspected primary neoplasm). Non-contrast chest CT can be considered
in 12 months if patient is high-risk. This recommendation follows
the consensus statement: Guidelines for Management of Incidental

Pulmonary Nodules Detected on CT Images: From the [HOSPITAL]

2. Mild bibasilar linear scarring and/or atelectasis.

3. Innumerable hepatic cysts.

Aortic Atherosclerosis ([SR]-[SR]).

## 2019-07-01 MED ORDER — IOHEXOL 300 MG/ML  SOLN
100.0000 mL | Freq: Once | INTRAMUSCULAR | Status: AC | PRN
  Administered 2019-07-01: 100 mL via INTRAVENOUS

## 2019-07-01 MED ORDER — PREDNISONE 20 MG PO TABS
30.0000 mg | ORAL_TABLET | Freq: Every day | ORAL | Status: DC
  Administered 2019-07-02 – 2019-07-03 (×2): 30 mg via ORAL
  Filled 2019-07-01 (×2): qty 1

## 2019-07-01 MED ORDER — PYRIDOSTIGMINE BROMIDE 60 MG PO TABS
60.0000 mg | ORAL_TABLET | Freq: Four times a day (QID) | ORAL | Status: DC
  Administered 2019-07-01 – 2019-07-04 (×11): 60 mg via ORAL
  Filled 2019-07-01 (×14): qty 1

## 2019-07-01 NOTE — Progress Notes (Signed)
Neurology Progress Note   S:// Seen and examined.  Feels better in terms of strength and speech since yesterday. Last negative inspiratory flow of -40 and vital capacity 2 L.  O:// Current vital signs: BP (!) 143/89 (BP Location: Right Arm)   Pulse 66   Temp 97.6 F (36.4 C) (Oral)   Resp 13   Ht 5\' 5"  (1.651 m)   Wt 62.3 kg   SpO2 98%   BMI 22.86 kg/m  Vital signs in last 24 hours: Temp:  [97.6 F (36.4 C)-98.7 F (37.1 C)] 97.6 F (36.4 C) (03/24 0754) Pulse Rate:  [62-76] 66 (03/24 0751) Resp:  [13-25] 13 (03/24 0751) BP: (127-147)/(76-89) 143/89 (03/24 0754) SpO2:  [95 %-100 %] 98 % (03/24 0751) General: Awake alert in no distress HEENT: Normocephalic atraumatic Lungs: Clear Neurological examination  awake alert oriented x3 Speech is dysarthric No evidence of aphasia Able to provide clear and coherent history. Cranial nerves: Pupils equal round reactive light, no evidence of ptosis on sustained upward gaze, no diplopia, face symmetric, facial sensation intact, tongue and palate midline. Motor exam: Fatigable 4-4+/5 all over along with 4+/5 next flexion and extension. Sensory intact No dysmetria Medications  Current Facility-Administered Medications:  .  acetaminophen (TYLENOL) tablet 650 mg, 650 mg, Oral, Q6H PRN **OR** acetaminophen (TYLENOL) suppository 650 mg, 650 mg, Rectal, Q6H PRN, Jonelle Sidle, Mohammad L, MD .  amLODipine (NORVASC) tablet 10 mg, 10 mg, Oral, Daily, Adhikari, Amrit, MD, 10 mg at 07/01/19 1016 .  aspirin EC tablet 81 mg, 81 mg, Oral, QODAY, Kc, Ramesh, MD, 81 mg at 06/30/19 0804 .  atorvastatin (LIPITOR) tablet 20 mg, 20 mg, Per NG tube, q1800, Kc, Ramesh, MD, 20 mg at 06/30/19 1717 .  enoxaparin (LOVENOX) injection 40 mg, 40 mg, Subcutaneous, Q24H, Garba, Mohammad L, MD, 40 mg at 06/30/19 1316 .  feeding supplement (ENSURE ENLIVE) (ENSURE ENLIVE) liquid 237 mL, 237 mL, Oral, TID BM, Adhikari, Amrit, MD, 237 mL at 06/30/19 2102 .  feeding  supplement (JEVITY 1.2 CAL) liquid 1,000 mL, 1,000 mL, Per Tube, Continuous, Kc, Ramesh, MD, Last Rate: 60 mL/hr at 06/29/19 2355, Rate Verify at 06/29/19 2355 .  hydrALAZINE (APRESOLINE) injection 5 mg, 5 mg, Intravenous, Q4H PRN, Kirby-Graham, Karsten Fells, NP, 5 mg at 06/23/19 2333 .  levothyroxine (SYNTHROID) tablet 88 mcg, 88 mcg, Oral, QAC breakfast, Gala Romney L, MD, 88 mcg at 07/01/19 0630 .  lip balm (CARMEX) ointment 1 application, 1 application, Topical, PRN, Kc, Ramesh, MD .  ondansetron (ZOFRAN) tablet 4 mg, 4 mg, Oral, Q6H PRN **OR** ondansetron (ZOFRAN) injection 4 mg, 4 mg, Intravenous, Q6H PRN, Garba, Mohammad L, MD .  predniSONE (DELTASONE) tablet 20 mg, 20 mg, Oral, Q breakfast, Greta Doom, MD, 20 mg at 07/01/19 0630 .  pyridostigmine (MESTINON) tablet 90 mg, 90 mg, Oral, Q4H while awake, Greta Doom, MD, 90 mg at 07/01/19 1015 Labs CBC    Component Value Date/Time   WBC 7.4 06/28/2019 0409   RBC 4.63 06/28/2019 0409   HGB 14.0 06/28/2019 0409   HCT 41.9 06/28/2019 0409   PLT 280 06/28/2019 0409   MCV 90.5 06/28/2019 0409   MCH 30.2 06/28/2019 0409   MCHC 33.4 06/28/2019 0409   RDW 13.2 06/28/2019 0409   LYMPHSABS 2.7 06/22/2019 2101   MONOABS 0.9 06/22/2019 2101   EOSABS 0.1 06/22/2019 2101   BASOSABS 0.1 06/22/2019 2101    CMP     Component Value Date/Time   NA 136 06/28/2019  0409   K 3.7 06/28/2019 0409   CL 101 06/28/2019 0409   CO2 27 06/28/2019 0409   GLUCOSE 113 (H) 06/28/2019 0409   BUN 22 06/28/2019 0409   CREATININE 0.76 06/29/2019 0541   CALCIUM 9.1 06/28/2019 0409   PROT 7.3 06/22/2019 2101   ALBUMIN 4.3 06/22/2019 2101   AST 31 06/22/2019 2101   ALT 18 06/22/2019 2101   ALKPHOS 76 06/22/2019 2101   BILITOT 1.5 (H) 06/22/2019 2101   GFRNONAA >60 06/29/2019 0541   GFRAA >60 06/29/2019 0541    No new imaging to review  Assessment: 79 year old woman with symptoms of dysphagia, dysarthria ptosis following initiation of  Neurontin with clinical exam consistent with myasthenia gravis-started on IVIG. Improving dysarthria dysphagia and general weakness. MuSK antibodies and ACH are antibodies pending. Case discussed with neuromuscular specialist outpatient over the phone and recommendations are below.   Impression: Myasthenia gravis  Recommendations: -Recommended reduction in Mestinon-change Mestinon to 60 mg every 4 hours when awake-8 AM 12 PM 4 PM and 4 PM and 8 PM. -Increase steroids from prednisone 20 daily to prednisone 30 daily. -CT with contrast of the chest for any evidence of thymoma-would also recommend obtaining CT with contrast of abdomen and pelvis to rule out malignancy. -Frequent neurochecks. -Continue NIF/FVC checks -Repeat speech therapy evaluation -Follow-up on the antibody results.  -- Amie Portland, MD [facility] Pager: [phone] If 7pm to 7am, please call on call as listed on AMION.

## 2019-07-01 NOTE — Progress Notes (Signed)
PROGRESS NOTE    Audrey Cowan  [id number] DOB: 07-Jan-1941 DOA: 06/22/2019 PCP: Carol Ada, MD   Brief Narrative:  Patient is a 79 year old female with history of hypertension, hyperlipidemia, hypothyroidism, recent right ankle fracture in January of this year, neuropathy, vitamin B12 deficiency, CKD stage III who presented to the emergency department complaints of difficulty speaking, swallowing for 3 days.  On presentation she was hemodynamically stable.  CVA ruled out,  CT/MRI were unremarkable.  Myasthenia gravis suspected and was started on IVIG.  Also started Mestinon,prednisone.Feeding  tube removed on 06/30/19  Assessment & Plan:   Principal Problem:   Generalized weakness Active Problems:   Benign essential HTN   Hypothyroidism   Hyperlipidemia   CKD (chronic kidney disease), stage III   Leucocytosis   Hypokalemia   Dysarthria   Generalized weakness/dysarthria/dysphagia/ptosis: Suspected MG worsened by Neurontin.  She completed course of IVIG for 5 days.  Also on Mestinon, prednisone.  No evidence of stroke as per CT/MRI.  Neurology following.  Vital capacity stable.  Neurontin on hold. MuSK antibodies, acetylcholine receptor antibodies pending. Speech therapy following.  She was  on tube feed,full liquid diet. Feeding tube was found to be clogged and she didnt not want it anymore.  Feeding tube removed.  Currently on dysphagia 2 diet. Neurology obtaining CT chest/abdomen/pelvis to rule out thymoma/malignancy.  Hypertension:Moniotr BP.  Continue amlodipine.  Home ACE inhibitor, Lasix on hold  Hypokalemia: Resolved  Hyperlipidemia: On statin  Hypothyroidism: Continue Synthyroid  CKD stage IIIa: Currently stable renal function  Generalized weakness: PT evaluated and recommended home health PT.   Nutrition Problem: Inadequate oral intake Etiology: decreased appetite      DVT prophylaxis: Lovenox  Code Status: Full Family Communication: Called son for  update on 06/30/19.Calls not received.  Neurology communicating with the son. Disposition Plan: Patient is from home.  Anticipate discharge to home with home health after full work-up, clearance from neurology, resolution of her MG symptoms   Consultants: Neurology  Procedures: None  Antimicrobials:  Anti-infectives (From admission, onward)   None      Subjective: Patient seen and examined at the bedside this morning.  Hemodynamically stable.  She looks more comfortable today.  Dysarthria has improved.  No weakness.  Diet advanced to dysphagia 2.  Objective: Vitals:   06/30/19 2000 07/01/19 0041 07/01/19 0437 07/01/19 0751  BP: (!) 147/87 127/76 127/79   Pulse: 71 63 62 66  Resp: 20 17 13 13   Temp: 97.9 F (36.6 C) 97.8 F (36.6 C)    TempSrc: Oral Oral    SpO2: 100% 95% 97% 98%  Weight:      Height:        Intake/Output Summary (Last 24 hours) at 07/01/2019 0753 Last data filed at 06/30/2019 1700 Gross per 24 hour  Intake 60 ml  Output --  Net 60 ml   Filed Weights   06/22/19 2046 06/23/19 0301 06/28/19 0328  Weight: 63 kg 63 kg 62.3 kg    Examination:   General exam: Pleasant elderly female HEENT:PERRL,Oral mucosa moist, Ear/Nose normal on gross exam Respiratory system: Bilateral equal air entry, normal vesicular breath sounds, no wheezes or crackles  Cardiovascular system: S1 & S2 heard, RRR. No JVD, murmurs, rubs, gallops or clicks. Gastrointestinal system: Abdomen is nondistended, soft and nontender. No organomegaly or masses felt. Normal bowel sounds heard. Central nervous system: Alert and oriented. No focal neurological deficits.  Mild dysarthria Extremities: No edema, no clubbing ,no cyanosis Skin: No rashes, lesions  or ulcers,no icterus ,no pallor     Data Reviewed: I have personally reviewed following labs and imaging studies  CBC: Recent Labs  Lab 06/26/19 0231 06/27/19 0226 06/28/19 0409  WBC 10.8* 8.9 7.4  HGB 14.7 14.0 14.0  HCT 43.5  41.5 41.9  MCV 89.0 88.7 90.5  PLT 333 286 [id number]   Basic Metabolic Panel: Recent Labs  Lab 06/25/19 0218 06/26/19 0231 06/28/19 0409 06/29/19 0541  NA 143 141 136  --   K 3.2* 3.6 3.7  --   CL 107 107 101  --   CO2 24 24 27   --   GLUCOSE 133* 139* 113*  --   BUN 21 20 22   --   CREATININE 0.74 0.71 0.76 0.76  CALCIUM 9.4 9.6 9.1  --    GFR: Estimated Creatinine Clearance: 53 mL/min (by C-G formula based on SCr of 0.76 mg/dL). Liver Function Tests: No results for input(s): AST, ALT, ALKPHOS, BILITOT, PROT, ALBUMIN in the last 168 hours. No results for input(s): LIPASE, AMYLASE in the last 168 hours. No results for input(s): AMMONIA in the last 168 hours. Coagulation Profile: No results for input(s): INR, PROTIME in the last 168 hours. Cardiac Enzymes: No results for input(s): CKTOTAL, CKMB, CKMBINDEX, TROPONINI in the last 168 hours. BNP (last 3 results) No results for input(s): PROBNP in the last 8760 hours. HbA1C: No results for input(s): HGBA1C in the last 72 hours. CBG: Recent Labs  Lab 06/30/19 0745 06/30/19 1234 06/30/19 1717 06/30/19 1958 06/30/19 2344  GLUCAP 88 116* 98 100* 92   Lipid Profile: No results for input(s): CHOL, HDL, LDLCALC, TRIG, CHOLHDL, LDLDIRECT in the last 72 hours. Thyroid Function Tests: No results for input(s): TSH, T4TOTAL, FREET4, T3FREE, THYROIDAB in the last 72 hours. Anemia Panel: No results for input(s): VITAMINB12, FOLATE, FERRITIN, TIBC, IRON, RETICCTPCT in the last 72 hours. Sepsis Labs: No results for input(s): PROCALCITON, LATICACIDVEN in the last 168 hours.  Recent Results (from the past 240 hour(s))  SARS CORONAVIRUS 2 (TAT 6-24 HRS) Nasopharyngeal Nasopharyngeal Swab     Status: None   Collection Time: 06/22/19 10:59 PM   Specimen: Nasopharyngeal Swab  Result Value Ref Range Status   SARS Coronavirus 2 NEGATIVE NEGATIVE Final    Comment: (NOTE) SARS-CoV-2 target nucleic acids are NOT DETECTED. The SARS-CoV-2 RNA is  generally detectable in upper and lower respiratory specimens during the acute phase of infection. Negative results do not preclude SARS-CoV-2 infection, do not rule out co-infections with other pathogens, and should not be used as the sole basis for treatment or other patient management decisions. Negative results must be combined with clinical observations, patient history, and epidemiological information. The expected result is Negative. Fact Sheet for Patients: SugarRoll.be Fact Sheet for Healthcare Providers: https://www.woods-mathews.com/ This test is not yet approved or cleared by the [place] FDA and  has been authorized for detection and/or diagnosis of SARS-CoV-2 by FDA under an Emergency Use Authorization (EUA). This EUA will remain  in effect (meaning this test can be used) for the duration of the COVID-19 declaration under Section 56 4(b)(1) of the Act, 21 U.S.C. section 360bbb-3(b)(1), unless the authorization is terminated or revoked sooner. Performed at [facility], [place] [address] [place], [place] [address]          Radiology Studies: No results found.      Scheduled Meds: . amLODipine  10 mg Oral Daily  . aspirin EC  81 mg Oral QODAY  . atorvastatin  20 mg Per NG tube q1800  . enoxaparin (LOVENOX) injection  40 mg Subcutaneous Q24H  . feeding supplement (ENSURE ENLIVE)  237 mL Oral TID BM  . levothyroxine  88 mcg Oral QAC breakfast  . predniSONE  20 mg Oral Q breakfast  . pyridostigmine  90 mg Oral Q4H while awake   Continuous Infusions: . feeding supplement (JEVITY 1.2 CAL) 60 mL/hr at 06/29/19 2355     LOS: 9 days    Time spent: 35 mins.More than 50% of that time was spent in counseling and/or coordination of care.      Shelly Coss, MD [facility] P3/24/2021, 7:53 AM

## 2019-07-01 NOTE — Plan of Care (Signed)

## 2019-07-01 NOTE — Plan of Care (Signed)
  Problem: Education: Goal: Knowledge of General Education information will improve Description: Including pain rating scale, medication(s)/side effects and non-pharmacologic comfort measures 07/01/2019 2352 by Loma Messing, RN Outcome: Progressing 07/01/2019 2349 by Loma Messing, RN Outcome: Progressing   Problem: Health Behavior/Discharge Planning: Goal: Ability to manage health-related needs will improve 07/01/2019 2352 by Loma Messing, RN Outcome: Progressing 07/01/2019 2349 by Loma Messing, RN Outcome: Progressing   Problem: Clinical Measurements: Goal: Ability to maintain clinical measurements within normal limits will improve 07/01/2019 2352 by Loma Messing, RN Outcome: Progressing 07/01/2019 2349 by Karsten Fells D, RN Outcome: Progressing Goal: Will remain free from infection 07/01/2019 2352 by Loma Messing, RN Outcome: Progressing 07/01/2019 2349 by Karsten Fells D, RN Outcome: Progressing Goal: Diagnostic test results will improve 07/01/2019 2352 by Loma Messing, RN Outcome: Progressing 07/01/2019 2349 by Karsten Fells D, RN Outcome: Progressing Goal: Respiratory complications will improve 07/01/2019 2352 by Loma Messing, RN Outcome: Progressing 07/01/2019 2349 by Karsten Fells D, RN Outcome: Progressing Goal: Cardiovascular complication will be avoided 07/01/2019 2352 by Karsten Fells D, RN Outcome: Progressing 07/01/2019 2349 by Loma Messing, RN Outcome: Progressing   Problem: Activity: Goal: Risk for activity intolerance will decrease 07/01/2019 2352 by Loma Messing, RN Outcome: Progressing 07/01/2019 2349 by Karsten Fells D, RN Outcome: Progressing   Problem: Nutrition: Goal: Adequate nutrition will be maintained 07/01/2019 2352 by Loma Messing, RN Outcome: Progressing 07/01/2019 2349 by Karsten Fells D, RN Outcome: Progressing   Problem: Coping: Goal: Level of anxiety will  decrease 07/01/2019 2352 by Loma Messing, RN Outcome: Progressing 07/01/2019 2349 by Loma Messing, RN Outcome: Progressing   Problem: Elimination: Goal: Will not experience complications related to bowel motility 07/01/2019 2352 by Loma Messing, RN Outcome: Progressing 07/01/2019 2349 by Loma Messing, RN Outcome: Progressing Goal: Will not experience complications related to urinary retention 07/01/2019 2352 by Loma Messing, RN Outcome: Progressing 07/01/2019 2349 by Loma Messing, RN Outcome: Progressing   Problem: Pain Managment: Goal: General experience of comfort will improve 07/01/2019 2352 by Loma Messing, RN Outcome: Progressing 07/01/2019 2349 by Karsten Fells D, RN Outcome: Progressing   Problem: Safety: Goal: Ability to remain free from injury will improve 07/01/2019 2352 by Loma Messing, RN Outcome: Progressing 07/01/2019 2349 by Karsten Fells D, RN Outcome: Progressing   Problem: Skin Integrity: Goal: Risk for impaired skin integrity will decrease 07/01/2019 2352 by Loma Messing, RN Outcome: Progressing 07/01/2019 2349 by Loma Messing, RN Outcome: Progressing

## 2019-07-01 NOTE — Progress Notes (Signed)
  Speech Language Pathology Treatment: Dysphagia  Patient Details Name: Audrey Cowan MRN: [id number] DOB: 12-24-1940 Today's Date: 07/01/2019 Time: [id number] SLP Time Calculation (min) (ACUTE ONLY): 34 min  Assessment / Plan / Recommendation Clinical Impression  Pt reports being "tired of chicken soup" and admits swallow is improved. Able to slide up in bed with HOB lowered.  SLP dysphagia treatment/education session. Reviewed pt's MBS on computer with her and provided written compensations.    PO observation for determining advancement completed. Pt with immediate cough after swallowing liquid to help clear pharynx of graham cracker/peanut butter resdiuals;  She was able to return demonstration of cough/hock and was advised to clinical reasoning for pharyngeal clearance;  Pt tolerating solids with general precautions today and reporting improved muscular function overall.    Provided pt with menu to allow her to order meals and advised her to assure consuming small frequent meals to allow fatigue factor modification.  Pt remains mildly dysarthric but is intelligible.  No focal CN deficits however ? minimal lingual weakness to left.    Using teach back, pt educated, will follow up for tolerance.    HPI HPI: Audrey Cowan is a 79 y.o. female with PMH of hypertension, hyperlipidemia, hypothyroidism, right ankle fracture, neuropathy, CKD, fatigable dysphagia, ptosis, dysarthria following Neurontin administration. Suspected to have Myasthenia Gravis.  Pt is s/p IVIG and on Mestinon and Prednisone.      SLP Plan  Continue with current plan of care       Recommendations  Diet recommendations: Dysphagia 2 (fine chop);Thin liquid Liquids provided via: Cup;Straw Medication Administration: Via alternative means Supervision: Patient able to self feed Compensations: Slow rate;Small sips/bites Postural Changes and/or Swallow Maneuvers: Seated upright 90 degrees                Oral  Care Recommendations: Oral care BID Follow up Recommendations: Other (comment)(TBD) SLP Visit Diagnosis: Dysphagia, oropharyngeal phase (R13.12) Plan: Continue with current plan of care       GO                Macario Golds 07/01/2019, 9:42 AM  Kathleen Lime, MS [facility] SLP [place] Office [phone]

## 2019-07-01 NOTE — Progress Notes (Signed)
Patient performed a -40 on NIF and 2L on VC with good effort.

## 2019-07-01 NOTE — Progress Notes (Signed)
NIF -24 VC 900 ml

## 2019-07-02 LAB — CBC WITH DIFFERENTIAL/PLATELET
Abs Immature Granulocytes: 0.04 10*3/uL (ref 0.00–0.07)
Basophils Absolute: 0.1 10*3/uL (ref 0.0–0.1)
Basophils Relative: 1 %
Eosinophils Absolute: 0.1 10*3/uL (ref 0.0–0.5)
Eosinophils Relative: 1 %
HCT: 39.5 % (ref 36.0–46.0)
Hemoglobin: 13.2 g/dL (ref 12.0–15.0)
Immature Granulocytes: 0 %
Lymphocytes Relative: 35 %
Lymphs Abs: 3.6 10*3/uL (ref 0.7–4.0)
MCH: 29.7 pg (ref 26.0–34.0)
MCHC: 33.4 g/dL (ref 30.0–36.0)
MCV: 88.8 fL (ref 80.0–100.0)
Monocytes Absolute: 0.9 10*3/uL (ref 0.1–1.0)
Monocytes Relative: 9 %
Neutro Abs: 5.6 10*3/uL (ref 1.7–7.7)
Neutrophils Relative %: 54 %
Platelets: 329 10*3/uL (ref 150–400)
RBC: 4.45 MIL/uL (ref 3.87–5.11)
RDW: 12.9 % (ref 11.5–15.5)
WBC: 10.2 10*3/uL (ref 4.0–10.5)
nRBC: 0 % (ref 0.0–0.2)

## 2019-07-02 LAB — BASIC METABOLIC PANEL
Anion gap: 9 (ref 5–15)
BUN: 15 mg/dL (ref 8–23)
CO2: 26 mmol/L (ref 22–32)
Calcium: 9.2 mg/dL (ref 8.9–10.3)
Chloride: 101 mmol/L (ref 98–111)
Creatinine, Ser: 0.68 mg/dL (ref 0.44–1.00)
GFR calc Af Amer: >60 mL/min (ref >60)
GFR calc non Af Amer: >60 mL/min (ref >60)
Glucose, Bld: 89 mg/dL (ref 70–99)
Potassium: 3.9 mmol/L (ref 3.5–5.1)
Sodium: 136 mmol/L (ref 135–145)

## 2019-07-02 LAB — GLUCOSE, CAPILLARY
Glucose-Capillary: 94 mg/dL (ref 70–99)
Glucose-Capillary: 109 mg/dL — ABNORMAL HIGH (ref 70–99)
Glucose-Capillary: 166 mg/dL — ABNORMAL HIGH (ref 70–99)
Glucose-Capillary: 134 mg/dL — ABNORMAL HIGH (ref 70–99)

## 2019-07-02 LAB — ACETYLCHOLINE RECEPTOR AB, ALL
Acety choline binding ab: 57.1 nmol/L — ABNORMAL HIGH (ref 0.00–0.24)
Acetylchol Block Ab: 57 % — ABNORMAL HIGH (ref 0–25)
Acetylcholine Modulat Ab: 59 % — ABNORMAL HIGH (ref 0–20)

## 2019-07-02 MED ORDER — IMMUNE GLOBULIN (HUMAN) 10 GM/100ML IV SOLN
500.0000 mg/kg | INTRAVENOUS | Status: AC
  Administered 2019-07-02 – 2019-07-03 (×2): 30 g via INTRAVENOUS
  Filled 2019-07-02: qty 100
  Filled 2019-07-02: qty 200

## 2019-07-02 NOTE — Progress Notes (Signed)
NIF 42 first time        70 second time Pt did very well

## 2019-07-02 NOTE — Progress Notes (Signed)
NIF -30 x 2  VC 2L    Strong effort

## 2019-07-02 NOTE — Progress Notes (Signed)
Neurology Progress Note   S:// Seen and examined. Reports being weaker as she did not sleep much last night.  O:// Current vital signs: BP 121/78 (BP Location: Left Arm)   Pulse 70   Temp 98.1 F (36.7 C)   Resp 20   Ht 5\' 5"  (1.651 m)   Wt 62.3 kg   SpO2 100%   BMI 22.86 kg/m  Vital signs in last 24 hours: Temp:  [97.9 F (36.6 C)-98.1 F (36.7 C)] 98.1 F (36.7 C) (03/25 0832) Pulse Rate:  [62-74] 70 (03/25 0837) Resp:  [20] 20 (03/25 0837) BP: (121-155)/(60-82) 121/78 (03/25 0832) SpO2:  [96 %-100 %] 100 % (03/25 0837) Essentially unchanged exam from yesterday General: Awake alert in no distress HEENT: Normocephalic atraumatic Lungs: Clear Neurological examination  awake alert oriented x3 Speech is dysarthric No evidence of aphasia Able to provide clear and coherent history. Cranial nerves: Pupils equal round reactive light, no evidence of ptosis on sustained upward gaze, no diplopia, face symmetric, facial sensation intact, tongue and palate midline. Motor exam: Fatigable 4-4+/5 all over along with 4+/5 next flexion and extension. Sensory intact No dysmetria Medications  Current Facility-Administered Medications:  .  acetaminophen (TYLENOL) tablet 650 mg, 650 mg, Oral, Q6H PRN **OR** acetaminophen (TYLENOL) suppository 650 mg, 650 mg, Rectal, Q6H PRN, Jonelle Sidle, Mohammad L, MD .  amLODipine (NORVASC) tablet 10 mg, 10 mg, Oral, Daily, Adhikari, Amrit, MD, 10 mg at 07/02/19 [id number] .  aspirin EC tablet 81 mg, 81 mg, Oral, QODAY, Kc, Ramesh, MD, 81 mg at 07/02/19 0833 .  atorvastatin (LIPITOR) tablet 20 mg, 20 mg, Per NG tube, q1800, Kc, Ramesh, MD, 20 mg at 07/01/19 1633 .  enoxaparin (LOVENOX) injection 40 mg, 40 mg, Subcutaneous, Q24H, Garba, Mohammad L, MD, 40 mg at 07/02/19 1110 .  feeding supplement (ENSURE ENLIVE) (ENSURE ENLIVE) liquid 237 mL, 237 mL, Oral, TID BM, Adhikari, Amrit, MD, 237 mL at 07/02/19 1405 .  feeding supplement (JEVITY 1.2 CAL) liquid 1,000 mL,  1,000 mL, Per Tube, Continuous, Kc, Ramesh, MD, Last Rate: 60 mL/hr at 06/29/19 2355, Rate Verify at 06/29/19 2355 .  hydrALAZINE (APRESOLINE) injection 5 mg, 5 mg, Intravenous, Q4H PRN, Kirby-Graham, Karsten Fells, NP, 5 mg at 06/23/19 2333 .  levothyroxine (SYNTHROID) tablet 88 mcg, 88 mcg, Oral, QAC breakfast, Elwyn Reach, MD, 88 mcg at 07/02/19 0536 .  lip balm (CARMEX) ointment 1 application, 1 application, Topical, PRN, Kc, Ramesh, MD .  ondansetron (ZOFRAN) tablet 4 mg, 4 mg, Oral, Q6H PRN **OR** ondansetron (ZOFRAN) injection 4 mg, 4 mg, Intravenous, Q6H PRN, Jonelle Sidle, Mohammad L, MD .  predniSONE (DELTASONE) tablet 30 mg, 30 mg, Oral, Q breakfast, Amie Portland, MD, 30 mg at 07/02/19 0834 .  pyridostigmine (MESTINON) tablet 60 mg, 60 mg, Oral, QID, Amie Portland, MD, 60 mg at 07/02/19 1110 Labs CBC    Component Value Date/Time   WBC 10.2 07/02/2019 0154   RBC 4.45 07/02/2019 0154   HGB 13.2 07/02/2019 0154   HCT 39.5 07/02/2019 0154   PLT 329 07/02/2019 0154   MCV 88.8 07/02/2019 0154   MCH 29.7 07/02/2019 0154   MCHC 33.4 07/02/2019 0154   RDW 12.9 07/02/2019 0154   LYMPHSABS 3.6 07/02/2019 0154   MONOABS 0.9 07/02/2019 0154   EOSABS 0.1 07/02/2019 0154   BASOSABS 0.1 07/02/2019 0154    CMP     Component Value Date/Time   NA 136 07/02/2019 0154   K 3.9 07/02/2019 0154   CL 101 07/02/2019  0154   CO2 26 07/02/2019 0154   GLUCOSE 89 07/02/2019 0154   BUN 15 07/02/2019 0154   CREATININE 0.68 07/02/2019 0154   CALCIUM 9.2 07/02/2019 0154   PROT 7.3 06/22/2019 2101   ALBUMIN 4.3 06/22/2019 2101   AST 31 06/22/2019 2101   ALT 18 06/22/2019 2101   ALKPHOS 76 06/22/2019 2101   BILITOT 1.5 (H) 06/22/2019 2101   GFRNONAA >60 07/02/2019 0154   GFRAA >60 07/02/2019 0154     Imaging I have reviewed images in epic and the results pertinent to this consultation are: CT chest abdomen pelvis negative for malignancy or thymoma.  Assessment: Antibody positive acetylcholine  receptor blocking, binding and modulating antibodies positive myasthenia gravis with acute worsening.  Recommendations: 2 more days of IVIG-total dose of 500 mg/kg/day. PT OT speech therapy Can be discharged home after the 2 doses are completed-likely on Saturday. Continue prednisone and pyridostigmine at current doses for now. On discharge, will increase the prednisone to 60 daily. We will follow.  -- Amie Portland, MD [facility] Pager: [phone] If 7pm to 7am, please call on call as listed on AMION.

## 2019-07-02 NOTE — Progress Notes (Signed)
PROGRESS NOTE    Audrey Cowan  [id number] DOB: [birth year] DOA: 06/22/2019 PCP: Carol Ada, MD   Brief Narrative:  Patient is a 79 year old female with history of hypertension, hyperlipidemia, hypothyroidism, recent right ankle fracture in January of this year, neuropathy, vitamin B12 deficiency, CKD stage III who presented to the emergency department complaints of difficulty speaking, swallowing for 3 days.  On presentation she was hemodynamically stable.  CVA ruled out,  CT/MRI were unremarkable.  Myasthenia gravis suspected and was started on IVIG.  Also started Mestinon,prednisone.Feeding  tube removed on 06/30/19  Assessment & Plan:   Principal Problem:   Generalized weakness Active Problems:   Benign essential HTN   Hypothyroidism   Hyperlipidemia   CKD (chronic kidney disease), stage III   Leucocytosis   Hypokalemia   Dysarthria   Generalized weakness/dysarthria/dysphagia/ptosis: Suspected MG worsened by Neurontin.  She completed course of IVIG for 5 days.  Also on Mestinon, prednisone.  No evidence of stroke as per CT/MRI.  Neurology following.  Vital capacity stable.  Neurontin on hold. MuSK antibodies negative,but positive  acetylcholine receptor antibodies. Speech therapy following.  She was  on tube feed,full liquid diet. Feeding tube was found to be clogged and she didnt not want it anymore.  Feeding tube removed.  Currently on dysphagia 3 diet. Neurology ordered CT chest/abdomen/pelvis which ruled out thymoma/malignancy.Just showed  subcentimeter noncalcified bilateral lower lobe lung nodules. Neurology contemplating possible need of plasma exchange.  Hypertension:Moniotr BP.  Continue amlodipine.  Home ACE inhibitor, Lasix on hold  Hypokalemia: Resolved  Hyperlipidemia: On statin  Hypothyroidism: Continue Synthyroid  CKD stage IIIa: Currently stable renal function  Generalized weakness: PT evaluated and recommended home health PT.   Nutrition  Problem: Inadequate oral intake Etiology: decreased appetite      DVT prophylaxis: Lovenox  Code Status: Full Family Communication: Neurology communicating with the son. Disposition Plan: Patient is from home.  Anticipate discharge to home with home health after full work-up, clearance from neurology.plan for possible plasma exchange  Consultants: Neurology  Procedures: None  Antimicrobials:  Anti-infectives (From admission, onward)   None      Subjective: Patient seen and examined the bedside this morning.  Hemodynamically stable.  She still has some dysarthria but no focal logical deficits.  Very frustrated to be in the hospital.  Objective: Vitals:   07/01/19 1509 07/01/19 2005 07/02/19 0006 07/02/19 0540  BP: 128/83 (!) 155/82 139/68 125/60  Pulse: 71 74 62 70  Resp:      Temp: 97.7 F (36.5 C) 98 F (36.7 C) 98 F (36.7 C) 97.9 F (36.6 C)  TempSrc:  Oral Oral Oral  SpO2: 97% 99% 96% 97%  Weight:      Height:       No intake or output data in the 24 hours ending 07/02/19 0752 Filed Weights   06/22/19 2046 06/23/19 0301 06/28/19 0328  Weight: 63 kg 63 kg 62.3 kg    Examination:   General exam: Pleasant elderly female HEENT:PERRL,Oral mucosa moist, Ear/Nose normal on gross exam Respiratory system: Bilateral equal air entry, normal vesicular breath sounds, no wheezes or crackles  Cardiovascular system: S1 & S2 heard, RRR. No JVD, murmurs, rubs, gallops or clicks. Gastrointestinal system: Abdomen is nondistended, soft and nontender. No organomegaly or masses felt. Normal bowel sounds heard. Central nervous system: Alert and oriented. No focal neurological deficits.  Mild dysarthria Extremities: No edema, no clubbing ,no cyanosis Skin: No rashes, lesions or ulcers,no icterus ,no pallor  Data Reviewed: I have personally reviewed following labs and imaging studies  CBC: Recent Labs  Lab 06/26/19 0231 06/27/19 0226 06/28/19 0409 07/02/19 0154  WBC  10.8* 8.9 7.4 10.2  NEUTROABS  --   --   --  5.6  HGB 14.7 14.0 14.0 13.2  HCT 43.5 41.5 41.9 39.5  MCV 89.0 88.7 90.5 88.8  PLT 333 286 280 [id number]   Basic Metabolic Panel: Recent Labs  Lab 06/26/19 0231 06/28/19 0409 06/29/19 0541 07/02/19 0154  NA 141 136  --  136  K 3.6 3.7  --  3.9  CL 107 101  --  101  CO2 24 27  --  26  GLUCOSE 139* 113*  --  89  BUN 20 22  --  15  CREATININE 0.71 0.76 0.76 0.68  CALCIUM 9.6 9.1  --  9.2   GFR: Estimated Creatinine Clearance: 53 mL/min (by C-G formula based on SCr of 0.68 mg/dL). Liver Function Tests: No results for input(s): AST, ALT, ALKPHOS, BILITOT, PROT, ALBUMIN in the last 168 hours. No results for input(s): LIPASE, AMYLASE in the last 168 hours. No results for input(s): AMMONIA in the last 168 hours. Coagulation Profile: No results for input(s): INR, PROTIME in the last 168 hours. Cardiac Enzymes: No results for input(s): CKTOTAL, CKMB, CKMBINDEX, TROPONINI in the last 168 hours. BNP (last 3 results) No results for input(s): PROBNP in the last 8760 hours. HbA1C: No results for input(s): HGBA1C in the last 72 hours. CBG: Recent Labs  Lab 06/30/19 2344 07/01/19 0752 07/01/19 1205 07/01/19 1511 07/01/19 2000  GLUCAP 92 95 130* 108* 95   Lipid Profile: No results for input(s): CHOL, HDL, LDLCALC, TRIG, CHOLHDL, LDLDIRECT in the last 72 hours. Thyroid Function Tests: No results for input(s): TSH, T4TOTAL, FREET4, T3FREE, THYROIDAB in the last 72 hours. Anemia Panel: No results for input(s): VITAMINB12, FOLATE, FERRITIN, TIBC, IRON, RETICCTPCT in the last 72 hours. Sepsis Labs: No results for input(s): PROCALCITON, LATICACIDVEN in the last 168 hours.  Recent Results (from the past 240 hour(s))  SARS CORONAVIRUS 2 (TAT 6-24 HRS) Nasopharyngeal Nasopharyngeal Swab     Status: None   Collection Time: 06/22/19 10:59 PM   Specimen: Nasopharyngeal Swab  Result Value Ref Range Status   SARS Coronavirus 2 NEGATIVE NEGATIVE  Final    Comment: (NOTE) SARS-CoV-2 target nucleic acids are NOT DETECTED. The SARS-CoV-2 RNA is generally detectable in upper and lower respiratory specimens during the acute phase of infection. Negative results do not preclude SARS-CoV-2 infection, do not rule out co-infections with other pathogens, and should not be used as the sole basis for treatment or other patient management decisions. Negative results must be combined with clinical observations, patient history, and epidemiological information. The expected result is Negative. Fact Sheet for Patients: SugarRoll.be Fact Sheet for Healthcare Providers: https://www.woods-mathews.com/ This test is not yet approved or cleared by the [place] FDA and  has been authorized for detection and/or diagnosis of SARS-CoV-2 by FDA under an Emergency Use Authorization (EUA). This EUA will remain  in effect (meaning this test can be used) for the duration of the COVID-19 declaration under Section 56 4(b)(1) of the Act, 21 U.S.C. section 360bbb-3(b)(1), unless the authorization is terminated or revoked sooner. Performed at [facility], [place] [address] [place], [place] [address]          Radiology Studies: CT CHEST W CONTRAST  Result Date: 07/01/2019 CLINICAL DATA:  Suspected pneumonia or abscess. EXAM: CT CHEST, ABDOMEN, AND  PELVIS WITH CONTRAST TECHNIQUE: Multidetector CT imaging of the chest, abdomen and pelvis was performed following the standard protocol during bolus administration of intravenous contrast. CONTRAST:  178mL OMNIPAQUE IOHEXOL 300 MG/ML  SOLN COMPARISON:  None. FINDINGS: Cardiovascular: There is mild calcification of the aortic arch and descending thoracic aorta. Normal heart size. No pericardial effusion. Mediastinum/Nodes: No enlarged mediastinal, hilar, or axillary lymph nodes. Multiple surgical clips are seen within the expected regions of the right and left lobes of  the thyroid gland. Lungs/Pleura: An ill-defined 4 mm noncalcified lung nodule is seen within the anterolateral aspect of the right lower lobe (axial CT image 90, CT series number 5). A 3 mm noncalcified lung nodule is seen within the lateral aspect of the left lower lobe (axial CT image 103, CT series number 5). Mild linear scarring and/or atelectasis is seen within the bilateral lung bases. Musculoskeletal: Degenerative changes seen within the thoracic spine. CT ABDOMEN PELVIS FINDINGS Hepatobiliary: Innumerable cysts of various sizes are seen scattered throughout the right and left lobes of the liver. No gallstones, gallbladder wall thickening, or biliary dilatation. Pancreas: Unremarkable. No pancreatic ductal dilatation or surrounding inflammatory changes. Spleen: Normal in size without focal abnormality. Adrenals/Urinary Tract: Adrenal glands are unremarkable. Kidneys are normal in size, without renal calculi or hydronephrosis. A subcentimeter cyst is seen within the anterior aspect of the mid right kidney. A similar appearing subcentimeter cyst is noted within the posterolateral aspect of the mid left kidney. Bladder is unremarkable. Stomach/Bowel: Stomach is within normal limits. The appendix is not identified. No evidence of bowel wall thickening, distention, or inflammatory changes. Vascular/Lymphatic: There is moderate severity aortic calcification. No enlarged abdominal or pelvic lymph nodes. Reproductive: Uterus and bilateral adnexa are unremarkable. Other: No abdominal wall hernia or abnormality. No abdominopelvic ascites. Musculoskeletal: Degenerative changes seen throughout the lumbar spine. IMPRESSION: 1. Subcentimeter noncalcified bilateral lower lobe lung nodules. No follow-up needed if patient is low-risk (and has no known or suspected primary neoplasm). Non-contrast chest CT can be considered in 12 months if patient is high-risk. This recommendation follows the consensus statement: Guidelines for  Management of Incidental Pulmonary Nodules Detected on CT Images: From the Fleischner Society 2017; Radiology 2017; 284:228-243. 2. Mild bibasilar linear scarring and/or atelectasis. 3. Innumerable hepatic cysts. Aortic Atherosclerosis (ICD10-I70.0). Electronically Signed   By: Virgina Norfolk M.D.   On: 07/01/2019 23:01   CT ABDOMEN PELVIS W CONTRAST  Result Date: 07/01/2019 CLINICAL DATA:  Suspected pneumonia or abscess. EXAM: CT CHEST, ABDOMEN, AND PELVIS WITH CONTRAST TECHNIQUE: Multidetector CT imaging of the chest, abdomen and pelvis was performed following the standard protocol during bolus administration of intravenous contrast. CONTRAST:  135mL OMNIPAQUE IOHEXOL 300 MG/ML  SOLN COMPARISON:  April 25, 2011 FINDINGS: CT CHEST FINDINGS Cardiovascular: There is mild calcification of the aortic arch and descending thoracic aorta. Normal heart size. No pericardial effusion. Mediastinum/Nodes: No enlarged mediastinal, hilar, or axillary lymph nodes. Multiple surgical clips are seen within the expected regions of the right and left lobes of the thyroid gland. Lungs/Pleura: An ill-defined 4 mm noncalcified lung nodule is seen within the anterolateral aspect of the right lower lobe (axial CT image 90, CT series number 5). A 3 mm noncalcified lung nodule is seen within the lateral aspect of the left lower lobe (axial CT image 103, CT series number 5). Mild linear scarring and/or atelectasis is seen within the bilateral lung bases. Musculoskeletal: Degenerative changes seen within the thoracic spine. CT ABDOMEN PELVIS FINDINGS Hepatobiliary: Innumerable cysts of various sizes  are seen scattered throughout the right and left lobes of the liver. No gallstones, gallbladder wall thickening, or biliary dilatation. Pancreas: Unremarkable. No pancreatic ductal dilatation or surrounding inflammatory changes. Spleen: Normal in size without focal abnormality. Adrenals/Urinary Tract: Adrenal glands are unremarkable.  Kidneys are normal in size, without renal calculi or hydronephrosis. A subcentimeter cyst is seen within the anterior aspect of the mid right kidney. A similar appearing subcentimeter cyst is noted within the posterolateral aspect of the mid left kidney. Bladder is unremarkable. Stomach/Bowel: Stomach is within normal limits. The appendix is not identified. No evidence of bowel wall thickening, distention, or inflammatory changes. Vascular/Lymphatic: There is moderate severity aortic calcification. No enlarged abdominal or pelvic lymph nodes. Reproductive: Uterus and bilateral adnexa are unremarkable. Other: No abdominal wall hernia or abnormality. No abdominopelvic ascites. Musculoskeletal: Degenerative changes seen throughout the lumbar spine. IMPRESSION: 1. Subcentimeter noncalcified bilateral lower lobe lung nodules. No follow-up needed if patient is low-risk (and has no known or suspected primary neoplasm). Non-contrast chest CT can be considered in 12 months if patient is high-risk. This recommendation follows the consensus statement: Guidelines for Management of Incidental Pulmonary Nodules Detected on CT Images: From the Fleischner Society 2017; Radiology 2017; 284:228-243. 2. Mild bibasilar linear scarring and/or atelectasis. 3. Innumerable hepatic cysts. Aortic Atherosclerosis (ICD10-I70.0). Electronically Signed   By: Virgina Norfolk M.D.   On: 07/01/2019 21:09        Scheduled Meds: . amLODipine  10 mg Oral Daily  . aspirin EC  81 mg Oral QODAY  . atorvastatin  20 mg Per NG tube q1800  . enoxaparin (LOVENOX) injection  40 mg Subcutaneous Q24H  . feeding supplement (ENSURE ENLIVE)  237 mL Oral TID BM  . levothyroxine  88 mcg Oral QAC breakfast  . predniSONE  30 mg Oral Q breakfast  . pyridostigmine  60 mg Oral QID   Continuous Infusions: . feeding supplement (JEVITY 1.2 CAL) 60 mL/hr at 06/29/19 2355     LOS: 10 days    Time spent: 35 mins.More than 50% of that time was spent in  counseling and/or coordination of care.      Shelly Coss, MD [facility] P3/25/2021, 7:52 AM

## 2019-07-02 NOTE — Progress Notes (Signed)
  Speech Language Pathology Treatment: Dysphagia  Patient Details Name: Audrey Cowan MRN: [id number] DOB: 1940-11-18 Today's Date: 07/02/2019 Time: 0715-0758 SLP Time Calculation (min) (ACUTE ONLY): 43 min  Assessment / Plan / Recommendation Clinical Impression  Pt seen to determine if pt tolerating po intake and readiness for dietary advancement. Pt is tearful today - SLP provided verbal suppor with active listening.  Informed RN of pt's concerns - with her stating she was woken every hour last night.  SLP assisted pt with set up to brush her teeth and gums - and educated her to importance of oral care for pulmonary health using teach back.  Pt's speech noted to be more dysarthric this am especially as 35 minute session progressed. She was aware and stated "I'm speaking drunk again".  Pt unhappy with her food choices - stating she can't have pancakes.  Observed her consuming thin hot chocolate, water, grits and eggs.  No overt aspiration noted but pt did demonstrate delayed cough post-swallow of thin.  Pt is aware of her dysphagia and thus recommend she be allowed dietary advancement to dys3/thin to allow her to choose food options. SLP recommends pt have follow up SLP to target dysphagia once her definitive diagnosis confirmed.    Using teach back, informed pt of need to keep cough/"Hock" strong.    HPI     KONNER WARRIOR is a 79 y.o. female with PMH of hypertension, hyperlipidemia, hypothyroidism, right ankle fracture, neuropathy, CKD, fatigable dysphagia, ptosis, dysarthria following Neurontin administration. Suspected to have Myasthenia Gravis.  Pt is s/p IVIG and on Mestinon and Prednisone.      SLP Plan  All goals met       Recommendations  Diet recommendations: Dysphagia 3 (mechanical soft);Thin liquid Liquids provided via: Cup;Straw Medication Administration: (crush if not contraindicated) Supervision: Patient able to self feed Compensations: Slow rate;Small  sips/bites Postural Changes and/or Swallow Maneuvers: Seated upright 90 degrees                Oral Care Recommendations: Oral care BID Follow up Recommendations: Other (comment)(TBD) SLP Visit Diagnosis: Dysphagia, oropharyngeal phase (R13.12) Plan: All goals met       GO                Macario Golds 07/02/2019, 8:21 AM   Kathleen Lime, MS [place] Office [phone]

## 2019-07-02 NOTE — Progress Notes (Signed)
Physical Therapy Treatment Patient Details Name: Audrey Cowan MRN: [id number] DOB: 1940-05-02 Today's Date: 07/02/2019    History of Present Illness 79 year old female with fatigable dysphagia, ptosis, dysarthria following Neurontin administration.  Work up underway for Myasthenia Gravis.    PT Comments    Patient progressing with strength, ambulation, but noting L LE incoordination worse than R with standing exercises and with gait.  She has some mal placement of foot which could lead to falling.  Re-inforced need for walker right now though pt eager to get back to walking on her own.  Feel she remains appropriate for HHPT with family support at d/c.   Follow Up Recommendations  Supervision/Assistance - 24 hour;Home health PT     Equipment Recommendations  None recommended by PT    Recommendations for Other Services       Precautions / Restrictions Precautions Precautions: Fall    Mobility  Bed Mobility               General bed mobility comments: pt OOB in chair upon arrival  Transfers Overall transfer level: Needs assistance Equipment used: Rolling walker (2 wheeled)   Sit to Stand: Supervision         General transfer comment: increased time,  heavy UE support needed on armrests on recliner, and momentum  Ambulation/Gait Ambulation/Gait assistance: Supervision;Min guard Gait Distance (Feet): 350 Feet Assistive device: Rolling walker (2 wheeled) Gait Pattern/deviations: Step-through pattern;Wide base of support;Ataxic;Decreased stride length;Drifts right/left     General Gait Details: decreased coordination evident with some improper placement L LE or scuffing shoe on the floor   Stairs             Wheelchair Mobility    Modified Rankin (Stroke Patients Only)       Balance Overall balance assessment: Needs assistance Sitting-balance support: No upper extremity supported Sitting balance-Leahy Scale: Fair Sitting balance - Comments:  leaning over to don shoes and tie them, no LOB   Standing balance support: Bilateral upper extremity supported Standing balance-Leahy Scale: Poor Standing balance comment: UE support needed for balance, brushed teeth with one UE support                            Cognition Arousal/Alertness: Awake/alert Behavior During Therapy: WFL for tasks assessed/performed Overall Cognitive Status: No family/caregiver present to determine baseline cognitive functioning Area of Impairment: Safety/judgement                         Safety/Judgement: Decreased awareness of safety;Decreased awareness of deficits     General Comments: fall risk, following rules, but frustrated by them      Exercises General Exercises - Lower Extremity Hip ABduction/ADduction: Strengthening;10 reps;Standing;Both Heel Raises: Strengthening;10 reps;Both;Standing Mini-Sqauts: Strengthening;10 reps;Standing;Both Other Exercises Other Exercises: standing hip extension x 10 UE support on counter Other Exercises: standing hamstring curls UE support on counter    General Comments        Pertinent Vitals/Pain Pain Assessment: No/denies pain    Home Living                      Prior Function            PT Goals (current goals can now be found in the care plan section) Progress towards PT goals: Progressing toward goals    Frequency    Min 3X/week      PT Plan Current  plan remains appropriate    Co-evaluation              AM-PAC PT "6 Clicks" Mobility   Outcome Measure  Help needed turning from your back to your side while in a flat bed without using bedrails?: None Help needed moving from lying on your back to sitting on the side of a flat bed without using bedrails?: None Help needed moving to and from a bed to a chair (including a wheelchair)?: A Little Help needed standing up from a chair using your arms (e.g., wheelchair or bedside chair)?: None Help needed to  walk in hospital room?: A Little Help needed climbing 3-5 steps with a railing? : A Little 6 Click Score: 21    End of Session   Activity Tolerance: Patient tolerated treatment well Patient left: in chair;with call bell/phone within reach;with chair alarm set   PT Visit Diagnosis: Unsteadiness on feet (R26.81);Muscle weakness (generalized) (M62.81)     Time: [id number] PT Time Calculation (min) (ACUTE ONLY): 35 min  Charges:  $Gait Training: 8-22 mins $Therapeutic Exercise: 8-22 mins                     Magda Kiel, [place] [phone] 07/02/2019    Reginia Naas 07/02/2019, 3:56 PM

## 2019-07-03 LAB — GLUCOSE, CAPILLARY
Glucose-Capillary: 106 mg/dL — ABNORMAL HIGH (ref 70–99)
Glucose-Capillary: 98 mg/dL (ref 70–99)
Glucose-Capillary: 150 mg/dL — ABNORMAL HIGH (ref 70–99)

## 2019-07-03 MED ORDER — PREDNISONE 20 MG PO TABS
60.0000 mg | ORAL_TABLET | Freq: Every day | ORAL | Status: DC
  Administered 2019-07-04: 60 mg via ORAL
  Filled 2019-07-03: qty 3

## 2019-07-03 NOTE — Plan of Care (Signed)

## 2019-07-03 NOTE — Plan of Care (Signed)
Seen and examined. Complains of not sleeping well due to patient care related interruptions at night. Will address with RN.  RECS: -Increase prednisone to 60mg  daily. -Continue the remaining IVIG  -- Amie Portland, MD [facility] Pager: [phone] If 7pm to 7am, please call on call as listed on AMION.

## 2019-07-03 NOTE — Progress Notes (Signed)
Occupational Therapy Treatment Patient Details Name: Audrey Cowan MRN: [id number] DOB: 27-Sep-1940 Today's Date: 07/03/2019    History of present illness 79 year old female with fatigable dysphagia, ptosis, dysarthria following Neurontin administration.  Work up underway for Myasthenia Gravis.   OT comments  Patient up in chair on arrival.  She was quite irritated but agreeable to therapy.  Patient needed to use the restroom and brush teeth.  Patient got up with supervision and walked to bathroom with sup and RW.  She Stood at sink to complete grooming with supervision and leaned on counter top for stability.  Patient has improved since prior visit and is more steady with walker, but still needs safety awareness cues.  Will continue to follow with OT acutely to address the deficits listed below.    Follow Up Recommendations  Supervision/Assistance - 24 hour;Home health OT(If able to get more care at home while son is gone then [facility])    Equipment Recommendations  None recommended by OT    Recommendations for Other Services      Precautions / Restrictions Precautions Precautions: Fall       Mobility Bed Mobility               General bed mobility comments: pt OOB in chair upon arrival  Transfers Overall transfer level: Needs assistance Equipment used: Rolling walker (2 wheeled) Transfers: Sit to/from Stand Sit to Stand: Supervision              Balance Overall balance assessment: Needs assistance Sitting-balance support: No upper extremity supported Sitting balance-Leahy Scale: Fair     Standing balance support: Bilateral upper extremity supported;During functional activity Standing balance-Leahy Scale: Poor Standing balance comment: Leans on counter when at sink grooming                           ADL either performed or assessed with clinical judgement   ADL Overall ADL's : Needs assistance/impaired Eating/Feeding:  Independent;Sitting Eating/Feeding Details (indicate cue type and reason): Complaining food was not cut into small enough bites Grooming: Wash/dry hands;Oral care;Min Dispensing optician: Min guard   Toileting- Clothing Manipulation and Hygiene: Supervision/safety       Functional mobility during ADLs: Therapist, art      Cognition Arousal/Alertness: Awake/alert Behavior During Therapy: Agitated Overall Cognitive Status: No family/caregiver present to determine baseline cognitive functioning Area of Impairment: Safety/judgement                         Safety/Judgement: Decreased awareness of safety;Decreased awareness of deficits     General Comments: Frustrated by care at hospital, says she will never come back here.  Apologizes though for being mean        Exercises     Shoulder Instructions       General Comments      Pertinent Vitals/ Pain       Pain Assessment: No/denies pain  Home Living                                          Prior Functioning/Environment  Frequency  Min 3X/week        Progress Toward Goals  OT Goals(current goals can now be found in the care plan section)  Progress towards OT goals: Progressing toward goals  Acute Rehab OT Goals Patient Stated Goal: To wash her hair OT Goal Formulation: With patient Time For Goal Achievement: 07/14/19 Potential to Achieve Goals: Good  Plan Discharge plan needs to be updated    Co-evaluation                 AM-PAC OT "6 Clicks" Daily Activity     Outcome Measure   Help from another person eating meals?: None Help from another person taking care of personal grooming?: A Little Help from another person toileting, which includes using toliet, bedpan, or urinal?: A Little Help from another person bathing (including washing, rinsing, drying)?: A  Little Help from another person to put on and taking off regular upper body clothing?: A Little Help from another person to put on and taking off regular lower body clothing?: A Little 6 Click Score: 19    End of Session Equipment Utilized During Treatment: Gait belt;Rolling walker  OT Visit Diagnosis: Unsteadiness on feet (R26.81);Muscle weakness (generalized) (M62.81)   Activity Tolerance Patient tolerated treatment well   Patient Left in chair;with call bell/phone within reach   Nurse Communication Mobility status        Time: [id number] OT Time Calculation (min): 10 min  Charges: OT General Charges $OT Visit: 1 Visit OT Treatments $Self Care/Home Management : 8-22 mins  Audrey Cowan, OTR/L    Audrey Cowan 07/03/2019, 12:56 PM

## 2019-07-03 NOTE — Progress Notes (Signed)
PROGRESS NOTE    Audrey Cowan  [id number] DOB: 14-Mar-1941 DOA: 06/22/2019 PCP: Carol Ada, MD   Brief Narrative:  Patient is a 79 year old female with history of hypertension, hyperlipidemia, hypothyroidism, recent right ankle fracture in January of this year, neuropathy, vitamin B12 deficiency, CKD stage III who presented to the emergency department complaints of difficulty speaking, swallowing for 3 days.  On presentation she was hemodynamically stable.  CVA ruled out,  CT/MRI were unremarkable.  Myasthenia gravis suspected and was started on IVIG.  Also started Mestinon,prednisone.Feeding  tube removed on 06/30/19  Assessment & Plan:   Principal Problem:   Generalized weakness Active Problems:   Benign essential HTN   Hypothyroidism   Hyperlipidemia   CKD (chronic kidney disease), stage III   Leucocytosis   Hypokalemia   Dysarthria   Generalized weakness/dysarthria/dysphagia/ptosis: Suspected MG worsened by Neurontin.  She initially completed course of IVIG for 5 days.  Also on Mestinon, prednisone.  No evidence of stroke as per CT/MRI.  Neurology following.  Vital capacity stable.  Neurontin on hold. MuSK antibodies negative,but positive  acetylcholine receptor antibodies. Speech therapy following.  She was  on tube feed,full liquid diet. Feeding tube was found to be clogged and she didnt not want it anymore.  Feeding tube removed.  Currently on dysphagia 3 diet. Neurology ordered CT chest/abdomen/pelvis which ruled out thymoma/malignancy.Just showed  subcentimeter noncalcified bilateral lower lobe lung nodules. Neurology ordered 2 more days of IV immunoglobulin.  She will complete the doses tonight  Hypertension:Moniotr BP.  Continue amlodipine.  Home ACE inhibitor, Lasix on hold  Hypokalemia: Resolved  Hyperlipidemia: On statin  Hypothyroidism: Continue Synthyroid  CKD stage IIIa: Currently stable renal function  Generalized weakness: PT evaluated and  recommended home health PT.   Nutrition Problem: Inadequate oral intake Etiology: decreased appetite      DVT prophylaxis: Lovenox  Code Status: Full Family Communication: Neurology communicating with the son. Disposition Plan: Patient is from home.  Anticipate discharge to home with home health tomorrow, she will complete 2 days course of IV immunoglobulin today at 8 PM  Consultants: Neurology  Procedures: None  Antimicrobials:  Anti-infectives (From admission, onward)   None      Subjective: Patient seen and examined the bedside this morning.  Hemodynamically stable.  Sitting in the chair.  Denies any new complaints.  Happy to know that she might go home tomorrow  Objective: Vitals:   07/03/19 0019 07/03/19 0036 07/03/19 0054 07/03/19 0459  BP: 134/79 134/82 123/64 127/79  Pulse: 63 62 61 (!) 58  Resp: 18 18 18 18   Temp:   98.1 F (36.7 C)   TempSrc:   Oral   SpO2: 98% 98% 96% 97%  Weight:      Height:       No intake or output data in the 24 hours ending 07/03/19 0757 Filed Weights   06/22/19 2046 06/23/19 0301 06/28/19 0328  Weight: 63 kg 63 kg 62.3 kg    Examination:     General exam: Comfortable HEENT:PERRL,Oral mucosa moist, Ear/Nose normal on gross exam Respiratory system: Bilateral equal air entry, normal vesicular breath sounds, no wheezes or crackles  Cardiovascular system: S1 & S2 heard, RRR. No JVD, murmurs, rubs, gallops or clicks. Gastrointestinal system: Abdomen is nondistended, soft and nontender. No organomegaly or masses felt. Normal bowel sounds heard. Central nervous system: Alert and oriented. No focal neurological deficits.Mild dyarthria Extremities: No edema, no clubbing ,no cyanosis Skin: No rashes, lesions or ulcers,no icterus ,no pallor  Data Reviewed: I have personally reviewed following labs and imaging studies  CBC: Recent Labs  Lab 06/27/19 0226 06/28/19 0409 07/02/19 0154  WBC 8.9 7.4 10.2  NEUTROABS  --   --   5.6  HGB 14.0 14.0 13.2  HCT 41.5 41.9 39.5  MCV 88.7 90.5 88.8  PLT 286 280 [id number]   Basic Metabolic Panel: Recent Labs  Lab 06/28/19 0409 06/29/19 0541 07/02/19 0154  NA 136  --  136  K 3.7  --  3.9  CL 101  --  101  CO2 27  --  26  GLUCOSE 113*  --  89  BUN 22  --  15  CREATININE 0.76 0.76 0.68  CALCIUM 9.1  --  9.2   GFR: Estimated Creatinine Clearance: 53 mL/min (by C-G formula based on SCr of 0.68 mg/dL). Liver Function Tests: No results for input(s): AST, ALT, ALKPHOS, BILITOT, PROT, ALBUMIN in the last 168 hours. No results for input(s): LIPASE, AMYLASE in the last 168 hours. No results for input(s): AMMONIA in the last 168 hours. Coagulation Profile: No results for input(s): INR, PROTIME in the last 168 hours. Cardiac Enzymes: No results for input(s): CKTOTAL, CKMB, CKMBINDEX, TROPONINI in the last 168 hours. BNP (last 3 results) No results for input(s): PROBNP in the last 8760 hours. HbA1C: No results for input(s): HGBA1C in the last 72 hours. CBG: Recent Labs  Lab 07/02/19 0830 07/02/19 1205 07/02/19 1809 07/02/19 2010 07/03/19 0101  GLUCAP 94 109* 166* 134* 106*   Lipid Profile: No results for input(s): CHOL, HDL, LDLCALC, TRIG, CHOLHDL, LDLDIRECT in the last 72 hours. Thyroid Function Tests: No results for input(s): TSH, T4TOTAL, FREET4, T3FREE, THYROIDAB in the last 72 hours. Anemia Panel: No results for input(s): VITAMINB12, FOLATE, FERRITIN, TIBC, IRON, RETICCTPCT in the last 72 hours. Sepsis Labs: No results for input(s): PROCALCITON, LATICACIDVEN in the last 168 hours.  No results found for this or any previous visit (from the past 240 hour(s)).       Radiology Studies: CT CHEST W CONTRAST  Result Date: 07/01/2019 CLINICAL DATA:  Suspected pneumonia or abscess. EXAM: CT CHEST, ABDOMEN, AND PELVIS WITH CONTRAST TECHNIQUE: Multidetector CT imaging of the chest, abdomen and pelvis was performed following the standard protocol during bolus  administration of intravenous contrast. CONTRAST:  1105mL OMNIPAQUE IOHEXOL 300 MG/ML  SOLN COMPARISON:  None. FINDINGS: Cardiovascular: There is mild calcification of the aortic arch and descending thoracic aorta. Normal heart size. No pericardial effusion. Mediastinum/Nodes: No enlarged mediastinal, hilar, or axillary lymph nodes. Multiple surgical clips are seen within the expected regions of the right and left lobes of the thyroid gland. Lungs/Pleura: An ill-defined 4 mm noncalcified lung nodule is seen within the anterolateral aspect of the right lower lobe (axial CT image 90, CT series number 5). A 3 mm noncalcified lung nodule is seen within the lateral aspect of the left lower lobe (axial CT image 103, CT series number 5). Mild linear scarring and/or atelectasis is seen within the bilateral lung bases. Musculoskeletal: Degenerative changes seen within the thoracic spine. CT ABDOMEN PELVIS FINDINGS Hepatobiliary: Innumerable cysts of various sizes are seen scattered throughout the right and left lobes of the liver. No gallstones, gallbladder wall thickening, or biliary dilatation. Pancreas: Unremarkable. No pancreatic ductal dilatation or surrounding inflammatory changes. Spleen: Normal in size without focal abnormality. Adrenals/Urinary Tract: Adrenal glands are unremarkable. Kidneys are normal in size, without renal calculi or hydronephrosis. A subcentimeter cyst is seen within the anterior aspect of the mid  right kidney. A similar appearing subcentimeter cyst is noted within the posterolateral aspect of the mid left kidney. Bladder is unremarkable. Stomach/Bowel: Stomach is within normal limits. The appendix is not identified. No evidence of bowel wall thickening, distention, or inflammatory changes. Vascular/Lymphatic: There is moderate severity aortic calcification. No enlarged abdominal or pelvic lymph nodes. Reproductive: Uterus and bilateral adnexa are unremarkable. Other: No abdominal wall hernia or  abnormality. No abdominopelvic ascites. Musculoskeletal: Degenerative changes seen throughout the lumbar spine. IMPRESSION: 1. Subcentimeter noncalcified bilateral lower lobe lung nodules. No follow-up needed if patient is low-risk (and has no known or suspected primary neoplasm). Non-contrast chest CT can be considered in 12 months if patient is high-risk. This recommendation follows the consensus statement: Guidelines for Management of Incidental Pulmonary Nodules Detected on CT Images: From the Fleischner Society 2017; Radiology 2017; 284:228-243. 2. Mild bibasilar linear scarring and/or atelectasis. 3. Innumerable hepatic cysts. Aortic Atherosclerosis (ICD10-I70.0). Electronically Signed   By: Virgina Norfolk M.D.   On: 07/01/2019 23:01   CT ABDOMEN PELVIS W CONTRAST  Result Date: 07/01/2019 CLINICAL DATA:  Suspected pneumonia or abscess. EXAM: CT CHEST, ABDOMEN, AND PELVIS WITH CONTRAST TECHNIQUE: Multidetector CT imaging of the chest, abdomen and pelvis was performed following the standard protocol during bolus administration of intravenous contrast. CONTRAST:  159mL OMNIPAQUE IOHEXOL 300 MG/ML  SOLN COMPARISON:  April 25, 2011 FINDINGS: CT CHEST FINDINGS Cardiovascular: There is mild calcification of the aortic arch and descending thoracic aorta. Normal heart size. No pericardial effusion. Mediastinum/Nodes: No enlarged mediastinal, hilar, or axillary lymph nodes. Multiple surgical clips are seen within the expected regions of the right and left lobes of the thyroid gland. Lungs/Pleura: An ill-defined 4 mm noncalcified lung nodule is seen within the anterolateral aspect of the right lower lobe (axial CT image 90, CT series number 5). A 3 mm noncalcified lung nodule is seen within the lateral aspect of the left lower lobe (axial CT image 103, CT series number 5). Mild linear scarring and/or atelectasis is seen within the bilateral lung bases. Musculoskeletal: Degenerative changes seen within the  thoracic spine. CT ABDOMEN PELVIS FINDINGS Hepatobiliary: Innumerable cysts of various sizes are seen scattered throughout the right and left lobes of the liver. No gallstones, gallbladder wall thickening, or biliary dilatation. Pancreas: Unremarkable. No pancreatic ductal dilatation or surrounding inflammatory changes. Spleen: Normal in size without focal abnormality. Adrenals/Urinary Tract: Adrenal glands are unremarkable. Kidneys are normal in size, without renal calculi or hydronephrosis. A subcentimeter cyst is seen within the anterior aspect of the mid right kidney. A similar appearing subcentimeter cyst is noted within the posterolateral aspect of the mid left kidney. Bladder is unremarkable. Stomach/Bowel: Stomach is within normal limits. The appendix is not identified. No evidence of bowel wall thickening, distention, or inflammatory changes. Vascular/Lymphatic: There is moderate severity aortic calcification. No enlarged abdominal or pelvic lymph nodes. Reproductive: Uterus and bilateral adnexa are unremarkable. Other: No abdominal wall hernia or abnormality. No abdominopelvic ascites. Musculoskeletal: Degenerative changes seen throughout the lumbar spine. IMPRESSION: 1. Subcentimeter noncalcified bilateral lower lobe lung nodules. No follow-up needed if patient is low-risk (and has no known or suspected primary neoplasm). Non-contrast chest CT can be considered in 12 months if patient is high-risk. This recommendation follows the consensus statement: Guidelines for Management of Incidental Pulmonary Nodules Detected on CT Images: From the Fleischner Society 2017; Radiology 2017; 284:228-243. 2. Mild bibasilar linear scarring and/or atelectasis. 3. Innumerable hepatic cysts. Aortic Atherosclerosis (ICD10-I70.0). Electronically Signed   By: Joyce Gross.D.  On: 07/01/2019 21:09        Scheduled Meds: . amLODipine  10 mg Oral Daily  . aspirin EC  81 mg Oral QODAY  . atorvastatin  20 mg Per  NG tube q1800  . enoxaparin (LOVENOX) injection  40 mg Subcutaneous Q24H  . feeding supplement (ENSURE ENLIVE)  237 mL Oral TID BM  . levothyroxine  88 mcg Oral QAC breakfast  . predniSONE  30 mg Oral Q breakfast  . pyridostigmine  60 mg Oral QID   Continuous Infusions: . feeding supplement (JEVITY 1.2 CAL) 60 mL/hr at 06/29/19 2355  . Immune Globulin 10%       LOS: 11 days    Time spent: 35 mins.More than 50% of that time was spent in counseling and/or coordination of care.      Shelly Coss, MD [facility] P3/26/2021, 7:57 AM

## 2019-07-03 NOTE — Progress Notes (Signed)
NIF _30 x 2 tries  VC 1.5L  Strong effort

## 2019-07-03 NOTE — TOC Initial Note (Addendum)
Transition of Care [facility]) - Initial/Assessment Note    Patient Details  Name: Audrey Cowan MRN: [id number] Date of Birth: 1941-01-20  Transition of Care [facility]) CM/SW Contact:    Benard Halsted, LCSW Phone Number: 07/03/2019, 3:01 PM  Clinical Narrative:                 CSW received consult for possible home health services at time of discharge. CSW spoke with patient regarding PT recommendation of Home Health PT at time of discharge. Patient requested her friend remain at bedside for the conversation. Patient was initially upset with the conversation and reported that she had a terrible time with staff at the hospital. CSW provided supportive listening.  Patient reported that she would like home health services. Will need Home Health PT/OT/RN/Aide and Face to Face orders. Patient reports preference for [facility] since she has used them before. CSW sent referral for review. CSW provided Medicare [place] ratings list. CSW discussed equipment needs with patient and she stated she has a walker and wheelchair at home and denied other needs. CSW confirmed PCP and address with patient: [place], [place]. Patient requested CSW contact her son. CSW spoke with patient's son, Leroy Sea. He states patient lives with him but that he works 12 hours a day. CSW explained home health and emailed him a private duty agency list which he reports he will arrange. He will transport patient at discharge. No further questions reported at this time. CSW to continue to follow and assist with discharge planning needs.  Update: [facility] is unable to provide Speech therapy. CSW sent referral to [facility]; they can accept patient with Speech therapy.    Expected Discharge Plan: [place] Barriers to Discharge: Continued Medical Work up   Patient Goals and CMS Choice Patient states their goals for this hospitalization and ongoing recovery are:: Get stronger CMS Medicare.gov Compare Post  Acute Care list provided to:: Patient Choice offered to / list presented to : Patient  Expected Discharge Plan and Services Expected Discharge Plan: [place]   Discharge Planning Services: CM Consult Post Acute Care Choice: [place] arrangements for the past 2 months: [place] Arranged: PT, OT, RN, Nurse's Aide   Date HH Agency Contacted: 07/03/19 Time [place]: [id number] Representative spoke with at [place]: Butch Penny  Prior Living Arrangements/Services Living arrangements for the past 2 months: [place] Lives with:: Adult Children Patient language and need for interpreter reviewed:: Yes Do you feel safe going back to the place where you live?: Yes      Need for Family Participation in Patient Care: Yes (Comment) Care giver support system in place?: Yes (comment) Current home services: DME Criminal Activity/Legal Involvement Pertinent to Current Situation/Hospitalization: No - Comment as needed  Activities of Daily Living Home Assistive Devices/Equipment: Gilford Rile (specify type) ADL Screening (condition at time of admission) Patient's cognitive ability adequate to safely complete daily activities?: Yes Is the patient deaf or have difficulty hearing?: Yes Does the patient have difficulty seeing, even when wearing glasses/contacts?: No Does the patient have difficulty concentrating, remembering, or making decisions?: No Patient able to express need for assistance with ADLs?: Yes Does the patient have difficulty dressing or bathing?: Yes Independently performs ADLs?: No Communication: Needs assistance Is this  a change from baseline?: Pre-admission baseline Grooming: Needs assistance Walks in Home: Independent Does the patient have difficulty walking or climbing stairs?: Yes Weakness of Legs: Both Weakness of Arms/Hands: Both  Permission Sought/Granted Permission sought to share information  with : Facility Sport and exercise psychologist, Family Supports Permission granted to share information with : Yes, Verbal Permission Granted  Share Information with NAME: Leroy Sea  Permission granted to share info w AGENCY: [place] granted to share info w Relationship: Son  Permission granted to share info w Contact Information: [phone]  Emotional Assessment Appearance:: Appears stated age Attitude/Demeanor/Rapport: Complaining, Hostile, Guarded Affect (typically observed): Accepting, Appropriate, Guarded Orientation: : Oriented to Self, Oriented to Place, Oriented to  Time, Oriented to Situation Alcohol / Substance Use: Not Applicable Psych Involvement: No (comment)  Admission diagnosis:  Dysarthria [R47.1] Generalized weakness [R53.1] Patient Active Problem List   Diagnosis Date Noted  . Dysarthria   . Hypokalemia 06/23/2019  . Benign essential HTN 06/22/2019  . Hypothyroidism 06/22/2019  . Hyperlipidemia 06/22/2019  . CKD (chronic kidney disease), stage III 06/22/2019  . Generalized weakness 06/22/2019  . Leucocytosis 06/22/2019  . Closed trimalleolar fracture of right ankle 10/08/2018   PCP:  Carol Ada, MD Pharmacy:   [id number] - [place], [address] [place] [address] Phone: [phone] Fax: [phone]     Social Determinants of Health (SDOH) Interventions    Readmission Risk Interventions No flowsheet data found.

## 2019-07-04 LAB — GLUCOSE, CAPILLARY: Glucose-Capillary: 121 mg/dL — ABNORMAL HIGH (ref 70–99)

## 2019-07-04 MED ORDER — PREDNISONE 20 MG PO TABS: 60.0000 mg | ORAL_TABLET | Freq: Every day | ORAL | 0 refills | Status: DC

## 2019-07-04 MED ORDER — PYRIDOSTIGMINE BROMIDE 60 MG PO TABS: 60.0000 mg | ORAL_TABLET | Freq: Four times a day (QID) | ORAL | 0 refills | Status: DC

## 2019-07-04 MED ORDER — PANTOPRAZOLE SODIUM 40 MG PO TBEC: 40.0000 mg | DELAYED_RELEASE_TABLET | Freq: Every day | ORAL | 1 refills | Status: AC

## 2019-07-04 MED ORDER — AMLODIPINE BESYLATE 10 MG PO TABS: 10.0000 mg | ORAL_TABLET | Freq: Every day | ORAL | 1 refills | Status: AC

## 2019-07-04 NOTE — TOC Transition Note (Signed)
Transition of Care [facility]) - CM/SW Discharge Note   Patient Details  Name: Audrey Cowan MRN: [id number] Date of Birth: [birth year]  Transition of Care [facility]) CM/SW Contact:  Bartholomew Crews, RN Phone Number: [phone] 07/04/2019, 9:48 AM   Clinical Narrative:    Patient to transition home today. MD placed [place] orders for RN, PT, OT, SLP, and aide. Notified Cory at [place] of transition home today. No further TOC needs identified at this time.   Final next level of care: [place] Barriers to Discharge: No Barriers Identified   Patient Goals and CMS Choice Patient states their goals for this hospitalization and ongoing recovery are:: Get stronger CMS Medicare.gov Compare Post Acute Care list provided to:: Patient Choice offered to / list presented to : Patient  Discharge Placement                       Discharge Plan and Services   Discharge Planning Services: CM Consult Post Acute Care Choice: Home Health                    HH Arranged: RN, PT, OT, Nurse's Aide, Speech Therapy HH Agency: [place] Date [facility] Agency Contacted: 07/04/19 Time [place]: [phone] Representative spoke with at [place]: [place] ([place]) Interventions     Readmission Risk Interventions No flowsheet data found.

## 2019-07-04 NOTE — Progress Notes (Signed)
VC 1.8 L great effort NIF -30 great effort

## 2019-07-04 NOTE — Progress Notes (Signed)
Neurology Progress Note   S:// Seen and examined.  Sitting comfortably in bed eating breakfast this morning.   O:// Current vital signs: BP 131/75   Pulse 66   Temp (!) 97.5 F (36.4 C) (Oral)   Resp 18   Ht 5\' 5"  (1.651 m)   Wt 62.3 kg   SpO2 98%   BMI 22.86 kg/m  Vital signs in last 24 hours: Temp:  [97.5 F (36.4 C)-98.6 F (37 C)] 97.5 F (36.4 C) (03/27 0249) Pulse Rate:  [60-80] 66 (03/27 0249) Resp:  [18] 18 (03/27 0249) BP: (111-160)/(60-95) 131/75 (03/27 0249) SpO2:  [94 %-99 %] 98 % (03/27 0249) Neurological exam Awake alert oriented x3 Speech is moderately dysarthric still Cranial nerves: No diplopia, pupils equal react to light, on sustained upward gaze very subtle ptosis bilaterally but otherwise no ptosis, face is symmetric. Motor exam: There is fatigable weakness in all 4 extremities but improved from prior days. Sensory exam: Intact to touch Coordination: No dysmetria Gait testing deferred at this time  Medications  Current Facility-Administered Medications:  .  acetaminophen (TYLENOL) tablet 650 mg, 650 mg, Oral, Q6H PRN **OR** acetaminophen (TYLENOL) suppository 650 mg, 650 mg, Rectal, Q6H PRN, Jonelle Sidle, Mohammad L, MD .  amLODipine (NORVASC) tablet 10 mg, 10 mg, Oral, Daily, Adhikari, Amrit, MD, 10 mg at 07/03/19 0839 .  aspirin EC tablet 81 mg, 81 mg, Oral, QODAY, Kc, Ramesh, MD, 81 mg at 07/02/19 0833 .  atorvastatin (LIPITOR) tablet 20 mg, 20 mg, Per NG tube, q1800, Kc, Ramesh, MD, 20 mg at 07/03/19 1640 .  enoxaparin (LOVENOX) injection 40 mg, 40 mg, Subcutaneous, Q24H, Garba, Mohammad L, MD, 40 mg at 07/03/19 1228 .  feeding supplement (ENSURE ENLIVE) (ENSURE ENLIVE) liquid 237 mL, 237 mL, Oral, TID BM, Adhikari, Amrit, MD, 237 mL at 07/03/19 2122 .  feeding supplement (JEVITY 1.2 CAL) liquid 1,000 mL, 1,000 mL, Per Tube, Continuous, Kc, Ramesh, MD, Last Rate: 60 mL/hr at 06/29/19 2355, Rate Verify at 06/29/19 2355 .  hydrALAZINE (APRESOLINE)  injection 5 mg, 5 mg, Intravenous, Q4H PRN, Kirby-Graham, Karsten Fells, NP, 5 mg at 06/23/19 2333 .  levothyroxine (SYNTHROID) tablet 88 mcg, 88 mcg, Oral, QAC breakfast, Elwyn Reach, MD, 88 mcg at 07/04/19 0559 .  lip balm (CARMEX) ointment 1 application, 1 application, Topical, PRN, Kc, Ramesh, MD .  ondansetron (ZOFRAN) tablet 4 mg, 4 mg, Oral, Q6H PRN **OR** ondansetron (ZOFRAN) injection 4 mg, 4 mg, Intravenous, Q6H PRN, Jonelle Sidle, Mohammad L, MD .  predniSONE (DELTASONE) tablet 60 mg, 60 mg, Oral, Q breakfast, Amie Portland, MD .  pyridostigmine (MESTINON) tablet 60 mg, 60 mg, Oral, QID, Amie Portland, MD, 60 mg at 07/03/19 2126 Labs CBC    Component Value Date/Time   WBC 10.2 07/02/2019 0154   RBC 4.45 07/02/2019 0154   HGB 13.2 07/02/2019 0154   HCT 39.5 07/02/2019 0154   PLT 329 07/02/2019 0154   MCV 88.8 07/02/2019 0154   MCH 29.7 07/02/2019 0154   MCHC 33.4 07/02/2019 0154   RDW 12.9 07/02/2019 0154   LYMPHSABS 3.6 07/02/2019 0154   MONOABS 0.9 07/02/2019 0154   EOSABS 0.1 07/02/2019 0154   BASOSABS 0.1 07/02/2019 0154    CMP     Component Value Date/Time   NA 136 07/02/2019 0154   K 3.9 07/02/2019 0154   CL 101 07/02/2019 0154   CO2 26 07/02/2019 0154   GLUCOSE 89 07/02/2019 0154   BUN 15 07/02/2019 0154   CREATININE 0.68  07/02/2019 0154   CALCIUM 9.2 07/02/2019 0154   PROT 7.3 06/22/2019 2101   ALBUMIN 4.3 06/22/2019 2101   AST 31 06/22/2019 2101   ALT 18 06/22/2019 2101   ALKPHOS 76 06/22/2019 2101   BILITOT 1.5 (H) 06/22/2019 2101   GFRNONAA >60 07/02/2019 0154   GFRAA >60 07/02/2019 0154   Assessment:  New diagnosis of seropositive myasthenia gravis.  Status post IVIG treatment. Started with IVIG for 5 days, antibodies came positive, given IVIG 5 mg/kg for 2 more days. Adjusted Mestinon and started on high-dose steroids.  Recommendations: Continue Mestinon at current dose Continue prednisone 60 mg daily, add GI protection with PPI. Follow-up with Dr.  Posey Pronto at [facility] neurology  -- Amie Portland, MD [facility] Pager: [phone] If 7pm to 7am, please call on call as listed on AMION.

## 2019-07-04 NOTE — Discharge Summary (Signed)
Physician Discharge Summary  Audrey Cowan [id number] DOB: 1941/02/17 DOA: 06/22/2019  PCP: Carol Ada, MD  Admit date: 06/22/2019 Discharge date: 07/04/2019  Admitted From: Home Disposition:  Home  Discharge Condition:Stable CODE STATUS:FULL Diet recommendation: Dysphagia 3  Brief/Interim Summary: Patient is a 79 year old female with history of hypertension, hyperlipidemia, hypothyroidism, recent right ankle fracture in January of this year, neuropathy, vitamin B12 deficiency, CKD stage III who presented to the emergency department complaints of difficulty speaking, swallowing for 3 days.  On presentation she was hemodynamically stable.  CVA ruled out,  CT/MRI were unremarkable.  Myasthenia gravis suspected and was started on IVIG.  Also started Mestinon,prednisone.Feeding  tube removed on 06/30/19, and started on dysphagia 3 diet.  She got 2 rounds of IVIG.  Neurology cleared her for discharge on Mestinon and prednisone.  She will follow-up with Dr. Posey Pronto, neurology, as an outpatient.  Home health arranged.  Following problems were addressed during hospitalization:  Generalized weakness/dysarthria/dysphagia/ptosis: Suspected MG worsened by Neurontin.  She initially completed course of IVIG for 5 days.  Also on Mestinon, prednisone.  No evidence of stroke as per CT/MRI.  Neurology was following.MuSK antibodies negative,but positive  acetylcholine receptor antibodies. Speech therapy was following.  She was  on tube feed,full liquid diet. Feeding tube removed.  Currently on dysphagia 3 diet. Neurology ordered CT chest/abdomen/pelvis which ruled out thymoma/malignancy.Just showed  subcentimeter noncalcified bilateral lower lobe lung nodules. Neurology ordered 2 more days of IV immunoglobulin.  She will complete the doses today. She will be discharged on Mestinon and prednisone.  She will follow-up with Dr. Narda Amber as an outpatient.  Hypertension:  Continue amlodipine.  Home ACE  inhibitor stopped.  Hypokalemia: Resolved  Hyperlipidemia: On statin  Hypothyroidism: Continue Synthyroid  CKD stage IIIa: Currently renal function has improved and is stable.  Generalized weakness: PT evaluated and recommended home health PT. she also needs follow-up with home speech therapy.  Discharge Diagnoses:  Principal Problem:   Generalized weakness Active Problems:   Benign essential HTN   Hypothyroidism   Hyperlipidemia   CKD (chronic kidney disease), stage III   Leucocytosis   Hypokalemia   Dysarthria    Discharge Instructions  Discharge Instructions    Ambulatory referral to Neurology   Complete by: As directed    An appointment is requested in approximately:2 weeks   Diet general   Complete by: As directed    Dysphagia 3   Discharge instructions   Complete by: As directed    1) Follow up with your PCP in a week. 2)Follow up neurology ,Dr Posey Pronto in 2 weeks. 3)Take prescribed medications as instructed. 4)Follow up with home health services.   Increase activity slowly   Complete by: As directed      Allergies as of 07/04/2019   No Known Allergies     Medication List    STOP taking these medications   benazepril 40 MG tablet Commonly known as: LOTENSIN   gabapentin 100 MG capsule Commonly known as: NEURONTIN     TAKE these medications   amLODipine 10 MG tablet Commonly known as: NORVASC Take 1 tablet (10 mg total) by mouth daily. What changed:   medication strength  how much to take   aspirin EC 81 MG tablet Take 81 mg by mouth every other day.   atorvastatin 20 MG tablet Commonly known as: LIPITOR Take 20 mg by mouth daily.   CALTRATE 600+D PO Take 1 tablet by mouth daily.   cholecalciferol 25 MCG (1000 UNIT)  tablet Commonly known as: VITAMIN D3 Take 1,000 Units by mouth daily.   furosemide 20 MG tablet Commonly known as: LASIX Take 10 mg by mouth every other day.   Grape Seed 100 MG Caps Take 100 mg by mouth daily.  MUSCADINE SEED FOR PSORIASIS   levothyroxine 88 MCG tablet Commonly known as: SYNTHROID Take 88 mcg by mouth daily before breakfast.   pantoprazole 40 MG tablet Commonly known as: Protonix Take 1 tablet (40 mg total) by mouth daily.   predniSONE 20 MG tablet Commonly known as: DELTASONE Take 3 tablets (60 mg total) by mouth daily with breakfast. Start taking on: July 05, 2019   pyridostigmine 60 MG tablet Commonly known as: MESTINON Take 1 tablet (60 mg total) by mouth 4 (four) times daily.      Follow-up Information    [facility] Follow up.   Specialty: Home Health Services Why: [place] Arranged (PT/OT/Speech). They will contact you to set up visit about 48 hours after discharge.  Contact information: [place] [id number] [phone]        Carol Ada, MD. Schedule an appointment as soon as possible for a visit in 1 week(s).   Specialty: Family Medicine Contact information: [address] [place] [address] Burt, [place], DO. Schedule an appointment as soon as possible for a visit in 2 week(s).   Specialty: Neurology Contact information: [place] [phone]          No Known Allergies  Consultations:  Neurology   Procedures/Studies: DG Chest 1 View  Result Date: 06/24/2019 CLINICAL DATA:  Shortness of breath. EXAM: CHEST  1 VIEW COMPARISON:  May 05, 2013. FINDINGS: The heart size and mediastinal contours are within normal limits. No pneumothorax or pleural effusion is noted. Stable minimal bibasilar subsegmental atelectasis or scarring. Feeding tube is seen entering stomach. The visualized skeletal structures are unremarkable. IMPRESSION: Stable minimal bibasilar subsegmental atelectasis or scarring. Electronically Signed   By: Marijo Conception M.D.   On: 06/24/2019 12:19   CT HEAD WO CONTRAST  Result  Date: 06/22/2019 CLINICAL DATA:  Slurred speech and trouble swallowing EXAM: CT HEAD WITHOUT CONTRAST TECHNIQUE: Contiguous axial images were obtained from the base of the skull through the vertex without intravenous contrast. COMPARISON:  None. FINDINGS: Brain: No acute territorial infarction, hemorrhage or intracranial mass. Mild atrophy. Moderate hypodensity in the white matter consistent with chronic small vessel ischemic change. Nonenlarged ventricles. Vascular: No hyperdense vessels.  Carotid vascular calcification. Skull: Normal. Negative for fracture or focal lesion. Sinuses/Orbits: No acute finding. Other: None IMPRESSION: 1. No CT evidence for acute intracranial abnormality. 2. Atrophy and chronic small vessel ischemic changes of the white matter Electronically Signed   By: Donavan Foil M.D.   On: 06/22/2019 22:29   CT CHEST W CONTRAST  Result Date: 07/01/2019 CLINICAL DATA:  Suspected pneumonia or abscess. EXAM: CT CHEST, ABDOMEN, AND PELVIS WITH CONTRAST TECHNIQUE: Multidetector CT imaging of the chest, abdomen and pelvis was performed following the standard protocol during bolus administration of intravenous contrast. CONTRAST:  173mL OMNIPAQUE IOHEXOL 300 MG/ML  SOLN COMPARISON:  None. FINDINGS: Cardiovascular: There is mild calcification of the aortic arch and descending thoracic aorta. Normal heart size. No pericardial effusion. Mediastinum/Nodes: No enlarged mediastinal, hilar, or axillary lymph nodes. Multiple surgical clips are seen within the expected regions of the right and  left lobes of the thyroid gland. Lungs/Pleura: An ill-defined 4 mm noncalcified lung nodule is seen within the anterolateral aspect of the right lower lobe (axial CT image 90, CT series number 5). A 3 mm noncalcified lung nodule is seen within the lateral aspect of the left lower lobe (axial CT image 103, CT series number 5). Mild linear scarring and/or atelectasis is seen within the bilateral lung bases.  Musculoskeletal: Degenerative changes seen within the thoracic spine. CT ABDOMEN PELVIS FINDINGS Hepatobiliary: Innumerable cysts of various sizes are seen scattered throughout the right and left lobes of the liver. No gallstones, gallbladder wall thickening, or biliary dilatation. Pancreas: Unremarkable. No pancreatic ductal dilatation or surrounding inflammatory changes. Spleen: Normal in size without focal abnormality. Adrenals/Urinary Tract: Adrenal glands are unremarkable. Kidneys are normal in size, without renal calculi or hydronephrosis. A subcentimeter cyst is seen within the anterior aspect of the mid right kidney. A similar appearing subcentimeter cyst is noted within the posterolateral aspect of the mid left kidney. Bladder is unremarkable. Stomach/Bowel: Stomach is within normal limits. The appendix is not identified. No evidence of bowel wall thickening, distention, or inflammatory changes. Vascular/Lymphatic: There is moderate severity aortic calcification. No enlarged abdominal or pelvic lymph nodes. Reproductive: Uterus and bilateral adnexa are unremarkable. Other: No abdominal wall hernia or abnormality. No abdominopelvic ascites. Musculoskeletal: Degenerative changes seen throughout the lumbar spine. IMPRESSION: 1. Subcentimeter noncalcified bilateral lower lobe lung nodules. No follow-up needed if patient is low-risk (and has no known or suspected primary neoplasm). Non-contrast chest CT can be considered in 12 months if patient is high-risk. This recommendation follows the consensus statement: Guidelines for Management of Incidental Pulmonary Nodules Detected on CT Images: From the Fleischner Society 2017; Radiology 2017; 284:228-243. 2. Mild bibasilar linear scarring and/or atelectasis. 3. Innumerable hepatic cysts. Aortic Atherosclerosis (ICD10-I70.0). Electronically Signed   By: Virgina Norfolk M.D.   On: 07/01/2019 23:01   MR BRAIN WO CONTRAST  Result Date: 06/22/2019 CLINICAL DATA:   Transient ischemic attack EXAM: MRI HEAD WITHOUT CONTRAST TECHNIQUE: Multiplanar, multiecho pulse sequences of the brain and surrounding structures were obtained without intravenous contrast. COMPARISON:  None. FINDINGS: Brain: No acute infarct, acute hemorrhage or extra-axial collection. Diffuse confluent hyperintense T2-weighted signal within the periventricular, deep and juxtacortical white matter, most commonly due to chronic ischemic microangiopathy. Normal volume of CSF spaces. Single chronic microhemorrhage in the left parietal lobe. Normal midline structures. Vascular: Normal flow voids. Skull and upper cervical spine: Normal marrow signal. Sinuses/Orbits: Negative. Other: None. IMPRESSION: 1. No acute intracranial abnormality. 2. 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 Electronically Signed   By: Ulyses Jarred M.D.   On: 06/22/2019 22:37   MR LUMBAR SPINE WO CONTRAST  Result Date: 06/22/2019 CLINICAL DATA:  Bilateral leg weakness and numbness. EXAM: MRI LUMBAR SPINE WITHOUT CONTRAST TECHNIQUE: Multiplanar, multisequence MR imaging of the lumbar spine was performed. No intravenous contrast was administered. COMPARISON:  None. FINDINGS: Segmentation:  Normal Alignment: Mild retrolisthesis T11-12, T12-L1, L1-2. 8 mm anterolisthesis L4-5. Vertebrae:  Normal bone marrow.  Negative for fracture or mass. Conus medullaris and cauda equina: Conus extends to the T12-L1 level. Conus and cauda equina appear normal. Paraspinal and other soft tissues: Negative for paraspinous mass or adenopathy. Disc levels: T12-L1: 4 mm retrolisthesis. Central small disc protrusion and mild facet degeneration. Negative for stenosis L1-2: 4 mm retrolisthesis. Mild disc and facet degeneration not causing significant spinal or foraminal stenosis L2-3: Mild disc and mild facet degeneration.  Negative for stenosis L3-4: Severe  facet degeneration bilaterally. Severe spinal stenosis. Extruded disc fragment on the  left with large disc fragment extending superiorly behind the left L3 vertebral body. Left L3 nerve root impingement. L4-5: Severe spinal stenosis. 8 mm anterolisthesis with severe facet degeneration bilaterally. Right foraminal disc protrusion with impingement of the right L4 nerve root due to disc protrusion and foraminal encroachment. L5-S1: Moderate facet degeneration. Moderate subarticular stenosis bilaterally with mild flattening of the S1 nerve roots bilaterally. 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 In addition, there is a large extruded disc fragment extending superiorly behind the L3 vertebral body on the left. Severe spinal stenosis L4-5 with 8 mm anterolisthesis and severe facet degeneration. Right foraminal encroachment with right L4 nerve root compression. Electronically Signed   By: Franchot Gallo M.D.   On: 06/22/2019 08:39   CT ABDOMEN PELVIS W CONTRAST  Result Date: 07/01/2019 CLINICAL DATA:  Suspected pneumonia or abscess. EXAM: CT CHEST, ABDOMEN, AND PELVIS WITH CONTRAST TECHNIQUE: Multidetector CT imaging of the chest, abdomen and pelvis was performed following the standard protocol during bolus administration of intravenous contrast. CONTRAST:  16mL OMNIPAQUE IOHEXOL 300 MG/ML  SOLN COMPARISON:  April 25, 2011 FINDINGS: CT CHEST FINDINGS Cardiovascular: There is mild calcification of the aortic arch and descending thoracic aorta. Normal heart size. No pericardial effusion. Mediastinum/Nodes: No enlarged mediastinal, hilar, or axillary lymph nodes. Multiple surgical clips are seen within the expected regions of the right and left lobes of the thyroid gland. Lungs/Pleura: An ill-defined 4 mm noncalcified lung nodule is seen within the anterolateral aspect of the right lower lobe (axial CT image 90, CT series number 5). A 3 mm noncalcified lung nodule is seen  within the lateral aspect of the left lower lobe (axial CT image 103, CT series number 5). Mild linear scarring and/or atelectasis is seen within the bilateral lung bases. Musculoskeletal: Degenerative changes seen within the thoracic spine. CT ABDOMEN PELVIS FINDINGS Hepatobiliary: Innumerable cysts of various sizes are seen scattered throughout the right and left lobes of the liver. No gallstones, gallbladder wall thickening, or biliary dilatation. Pancreas: Unremarkable. No pancreatic ductal dilatation or surrounding inflammatory changes. Spleen: Normal in size without focal abnormality. Adrenals/Urinary Tract: Adrenal glands are unremarkable. Kidneys are normal in size, without renal calculi or hydronephrosis. A subcentimeter cyst is seen within the anterior aspect of the mid right kidney. A similar appearing subcentimeter cyst is noted within the posterolateral aspect of the mid left kidney. Bladder is unremarkable. Stomach/Bowel: Stomach is within normal limits. The appendix is not identified. No evidence of bowel wall thickening, distention, or inflammatory changes. Vascular/Lymphatic: There is moderate severity aortic calcification. No enlarged abdominal or pelvic lymph nodes. Reproductive: Uterus and bilateral adnexa are unremarkable. Other: No abdominal wall hernia or abnormality. No abdominopelvic ascites. Musculoskeletal: Degenerative changes seen throughout the lumbar spine. IMPRESSION: 1. Subcentimeter noncalcified bilateral lower lobe lung nodules. No follow-up needed if patient is low-risk (and has no known or suspected primary neoplasm). Non-contrast chest CT can be considered in 12 months if patient is high-risk. This recommendation follows the consensus statement: Guidelines for Management of Incidental Pulmonary Nodules Detected on CT Images: From the Fleischner Society 2017; Radiology 2017; 284:228-243. 2. Mild bibasilar linear scarring and/or atelectasis. 3. Innumerable hepatic cysts. Aortic  Atherosclerosis (ICD10-I70.0). Electronically Signed   By: Virgina Norfolk M.D.   On: 07/01/2019 21:09   DG Swallowing Func-Speech Pathology  Result Date: 06/24/2019 Objective Swallowing Evaluation:  Type of Study: MBS-Modified Barium Swallow Study  Patient Details Name: APOORVA MANERS MRN: [id number] Date of Birth: 09/16/1940 Today's Date: 06/24/2019 Time: SLP Start Time (ACUTE ONLY): 1200 -SLP Stop Time (ACUTE ONLY): 1218 SLP Time Calculation (min) (ACUTE ONLY): 18 min Past Medical History: Past Medical History: Diagnosis Date . Chronic kidney disease   CKD . Hypercholesteremia  . Hypertension  . Hypothyroidism  . Trimalleolar fracture   RIGHT ANKLE Past Surgical History: Past Surgical History: Procedure Laterality Date . ORIF ANKLE FRACTURE Right 10/09/2018  Procedure: OPEN REDUCTION INTERNAL FIXATION (ORIF) Right ankle trimalleolar fracture;  Surgeon: Wylene Simmer, MD;  Location: [facility];  Service: Orthopedics;  Laterality: Right;  50min HPI: SHERYE JANIS is a 79 y.o. female with PMH of hypertension, hyperlipidemia, hypothyroidism, right ankle fracture, neuropathy, CKD, fatigable dysphagia, ptosis, dysarthria following Neurontin administration. Suspected to have Myasthenia Gravis.  Subjective: cooperative Assessment / Plan / Recommendation CHL IP CLINICAL IMPRESSIONS 06/24/2019 Clinical Impression Pt demonstrates a moderate oropharyngeal dysphagia characterized by fatiguable weakness in the oral and pharyngeal musculature. Most sgnifiantly there is decreased base of tongue strength, reduced hyoid excursion, reduced epiglottic deflection and weak pharyngeal contraction. This results in significant vallecular residue, increasing with viscosity. Pt is best able to manage sips of thin liquids, is able to clear 80% of residue with 2-3 swallows. If trace penetration occurs, pt senses it and ejects it. Residue with puree was severe and required a liquid wash to clear with increased chance of  aspiration. Positional strategies attempted without benefit. Pt could not achieve effortful swallow. Recommend pt continue with cortrak for primary source of nutrition, though she can also take sips of thin liquids for comfort and therapeutic activities. Hopeful for increased strength with medical management.   SLP Visit Diagnosis Dysphagia, oropharyngeal phase (R13.12) Attention and concentration deficit following -- Frontal lobe and executive function deficit following -- Impact on safety and function Moderate aspiration risk   CHL IP TREATMENT RECOMMENDATION 06/24/2019 Treatment Recommendations Therapy as outlined in treatment plan below   Prognosis 06/24/2019 Prognosis for Safe Diet Advancement Good Barriers to Reach Goals -- Barriers/Prognosis Comment -- CHL IP DIET RECOMMENDATION 06/24/2019 SLP Diet Recommendations Thin liquid Liquid Administration via Cup;Straw Medication Administration Via alternative means Compensations -- Postural Changes --   No flowsheet data found.  CHL IP FOLLOW UP RECOMMENDATIONS 06/24/2019 Follow up Recommendations Other (comment)   CHL IP FREQUENCY AND DURATION 06/24/2019 Speech Therapy Frequency (ACUTE ONLY) min 2x/week Treatment Duration 2 weeks      CHL IP ORAL PHASE 06/24/2019 Oral Phase Impaired Oral - Pudding Teaspoon -- Oral - Pudding Cup -- Oral - Honey Teaspoon -- Oral - Honey Cup -- Oral - Nectar Teaspoon -- Oral - Nectar Cup -- Oral - Nectar Straw -- Oral - Thin Teaspoon Left pocketing in lateral sulci;Right pocketing in lateral sulci;Weak lingual manipulation Oral - Thin Cup Left pocketing in lateral sulci;Right pocketing in lateral sulci;Weak lingual manipulation Oral - Thin Straw Left pocketing in lateral sulci;Right pocketing in lateral sulci;Weak lingual manipulation Oral - Puree Left pocketing in lateral sulci;Right pocketing in lateral sulci;Weak lingual manipulation Oral - Mech Soft -- Oral - Regular -- Oral - Multi-Consistency -- Oral - Pill -- Oral Phase - Comment --   CHL IP PHARYNGEAL PHASE 06/24/2019 Pharyngeal Phase Impaired Pharyngeal- Pudding Teaspoon -- Pharyngeal -- Pharyngeal- Pudding Cup -- Pharyngeal -- Pharyngeal- Honey Teaspoon -- Pharyngeal -- Pharyngeal- Honey Cup -- Pharyngeal -- Pharyngeal- Nectar Teaspoon -- Pharyngeal -- Pharyngeal- Nectar Cup Reduced  epiglottic inversion;Reduced anterior laryngeal mobility;Reduced tongue base retraction;Penetration/Apiration after swallow;Pharyngeal residue - valleculae Pharyngeal Material enters airway, CONTACTS cords and then ejected out;Material enters airway, remains ABOVE vocal cords and not ejected out Pharyngeal- Nectar Straw -- Pharyngeal -- Pharyngeal- Thin Teaspoon Reduced epiglottic inversion;Reduced anterior laryngeal mobility;Reduced tongue base retraction;Penetration/Apiration after swallow;Pharyngeal residue - valleculae Pharyngeal Material enters airway, remains ABOVE vocal cords then ejected out;Material does not enter airway Pharyngeal- Thin Cup Reduced epiglottic inversion;Reduced anterior laryngeal mobility;Reduced tongue base retraction;Penetration/Apiration after swallow;Pharyngeal residue - valleculae Pharyngeal Material enters airway, remains ABOVE vocal cords then ejected out;Material does not enter airway Pharyngeal- Thin Straw Reduced epiglottic inversion;Reduced anterior laryngeal mobility;Reduced tongue base retraction;Penetration/Apiration after swallow;Pharyngeal residue - valleculae Pharyngeal Material enters airway, remains ABOVE vocal cords then ejected out;Material does not enter airway Pharyngeal- Puree Reduced epiglottic inversion;Reduced anterior laryngeal mobility;Reduced tongue base retraction;Pharyngeal residue - valleculae Pharyngeal Material does not enter airway Pharyngeal- Mechanical Soft -- Pharyngeal -- Pharyngeal- Regular -- Pharyngeal -- Pharyngeal- Multi-consistency -- Pharyngeal -- Pharyngeal- Pill -- Pharyngeal -- Pharyngeal Comment --  No flowsheet data found. DeBlois, Katherene Ponto 06/24/2019, 12:36 PM                 Subjective:  Patient seen and examined at the bedside this morning.  Hemodynamically stable for discharge today.  Discharge Exam: Vitals:   07/04/19 0249 07/04/19 0818  BP: 131/75 135/74  Pulse: 66 71  Resp:  15  Temp:  97.9 F (36.6 C)  SpO2: 98% 100%   Vitals:   07/04/19 0212 07/04/19 0249 07/04/19 0249 07/04/19 0818  BP: 111/60 131/78 131/75 135/74  Pulse: 62  66 71  Resp: 18 18  15   Temp: 97.7 F (36.5 C) (!) 97.5 F (36.4 C)  97.9 F (36.6 C)  TempSrc: Oral Oral  Oral  SpO2: 94% 96% 98% 100%  Weight:      Height:        General: Pt is alert, awake, not in acute distress Cardiovascular: RRR, S1/S2 +, no rubs, no gallops Respiratory: CTA bilaterally, no wheezing, no rhonchi Abdominal: Soft, NT, ND, bowel sounds + Extremities: no edema, no cyanosis    The results of significant diagnostics from this hospitalization (including imaging, microbiology, ancillary and laboratory) are listed below for reference.     Microbiology: No results found for this or any previous visit (from the past 240 hour(s)).   Labs: BNP (last 3 results) No results for input(s): BNP in the last 8760 hours. Basic Metabolic Panel: Recent Labs  Lab 06/28/19 0409 06/29/19 0541 07/02/19 0154  NA 136  --  136  K 3.7  --  3.9  CL 101  --  101  CO2 27  --  26  GLUCOSE 113*  --  89  BUN 22  --  15  CREATININE 0.76 0.76 0.68  CALCIUM 9.1  --  9.2   Liver Function Tests: No results for input(s): AST, ALT, ALKPHOS, BILITOT, PROT, ALBUMIN in the last 168 hours. No results for input(s): LIPASE, AMYLASE in the last 168 hours. No results for input(s): AMMONIA in the last 168 hours. CBC: Recent Labs  Lab 06/28/19 0409 07/02/19 0154  WBC 7.4 10.2  NEUTROABS  --  5.6  HGB 14.0 13.2  HCT 41.9 39.5  MCV 90.5 88.8  PLT 280 329   Cardiac Enzymes: No results for input(s): CKTOTAL, CKMB, CKMBINDEX, TROPONINI in the last 168  hours. BNP: Invalid input(s): POCBNP CBG: Recent Labs  Lab 07/02/19 2010 07/03/19 0101 07/03/19  1244 07/03/19 1936 07/04/19 0815  GLUCAP 134* 106* 98 150* 121*   D-Dimer No results for input(s): DDIMER in the last 72 hours. Hgb A1c No results for input(s): HGBA1C in the last 72 hours. Lipid Profile No results for input(s): CHOL, HDL, LDLCALC, TRIG, CHOLHDL, LDLDIRECT in the last 72 hours. Thyroid function studies No results for input(s): TSH, T4TOTAL, T3FREE, THYROIDAB in the last 72 hours.  Invalid input(s): FREET3 Anemia work up No results for input(s): VITAMINB12, FOLATE, FERRITIN, TIBC, IRON, RETICCTPCT in the last 72 hours. Urinalysis No results found for: COLORURINE, APPEARANCEUR, LABSPEC, [place], GLUCOSEU, HGBUR, BILIRUBINUR, KETONESUR, PROTEINUR, UROBILINOGEN, NITRITE, LEUKOCYTESUR Sepsis Labs Invalid input(s): PROCALCITONIN,  WBC,  LACTICIDVEN Microbiology No results found for this or any previous visit (from the past 240 hour(s)).  Please note: You were cared for by a hospitalist during your hospital stay. Once you are discharged, your primary care physician will handle any further medical issues. Please note that NO REFILLS for any discharge medications will be authorized once you are discharged, as it is imperative that you return to your primary care physician (or establish a relationship with a primary care physician if you do not have one) for your post hospital discharge needs so that they can reassess your need for medications and monitor your lab values.    Time coordinating discharge: 40 minutes  SIGNED:   Shelly Coss, MD  [facility] 07/04/2019, 9:32 AM Pager [phone]  If 7PM-7AM, please contact night-coverage [email or web address] Password [email or web address]

## 2019-07-06 ENCOUNTER — Telehealth: Payer: Self-pay

## 2019-07-06 ENCOUNTER — Ambulatory Visit (INDEPENDENT_AMBULATORY_CARE_PROVIDER_SITE_OTHER): Payer: Medicare Other | Admitting: Neurology

## 2019-07-06 ENCOUNTER — Encounter: Payer: Self-pay | Admitting: Neurology

## 2019-07-06 ENCOUNTER — Other Ambulatory Visit: Payer: Self-pay

## 2019-07-06 VITALS — BP 163/82 | HR 90 | Resp 18 | Ht 65.0 in | Wt 130.0 lb

## 2019-07-06 DIAGNOSIS — G7001 Myasthenia gravis with (acute) exacerbation: Secondary | ICD-10-CM | POA: Diagnosis not present

## 2019-07-06 MED ORDER — SULFAMETHOXAZOLE-TRIMETHOPRIM 800-160 MG PO TABS: 1.0000 | ORAL_TABLET | ORAL | 3 refills | Status: DC

## 2019-07-06 MED ORDER — PYRIDOSTIGMINE BROMIDE ER 180 MG PO TBCR: 180.0000 mg | EXTENDED_RELEASE_TABLET | Freq: Every day | ORAL | 5 refills | Status: DC

## 2019-07-06 NOTE — Progress Notes (Signed)
Follow-up Visit   Date: 07/06/19   Audrey Cowan MRN: [id number] DOB: 06/24/1940   Interim History: Audrey Cowan is a 79 y.o. right-handed Caucasian female with hypothyroidism, hyperlipidemia, anxiety/CKD stage3, hypertension, and vitamin B12 deficiency returning to the clinic for follow-up of newly diagnosed seropositive myasthenia gravis.  The patient was accompanied to the clinic by son.  History of present illness: She broke her ankle in June 2020 an was immobilized for a few weeks.  Starting in [place], she began having numbness of the feet and hands.  Symptoms started at the same time and has been constant since onset.  She does not have pain, but finds it uncomfortable.  She also complains of imbalance and has been using a walker for the past several months.  When she raised these concerns to her PCP, labs were checked and she was found to have severe vitamin B12 deficiency (B12 < 50).  Upon further questioning, she reports having poor appetite and eating small meals, but has always been like that.  She started weekly injections about a month ago but has not appreciated any change in numbness or balance. She does not drink alcohol or have history of diabetes.  She is now living with her son. 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  UPDATE 07/06/19:  She is here for hospital discharge follow-up of newly diagnosed seropositive myasthenia gravis, which presented with severe ptosis, dysarthria, dysphagia, and generalized weakness.  She received 5-days IVIG followed by an additional two cycles due to limited response and started on prednisone 60mg  + mestinon 60mg  four times daily.  She feels very fatigued and tired and continues to have droopy eyelids, mild slurred speech, and moderate difficulty swallowing.  She is on a pureed diet and her son crushes her  medications.  Over the past few days, her arm strength has improved some, but her left leg remains weak and she has generalized weakness.  She does feel better after resting, but quickly fatigues again.    Medications:  Current Outpatient Medications on File Prior to Visit  Medication Sig Dispense Refill  . amLODipine (NORVASC) 10 MG tablet Take 1 tablet (10 mg total) by mouth daily. 30 tablet 1  . aspirin EC 81 MG tablet Take 81 mg by mouth every other day.    Marland Kitchen atorvastatin (LIPITOR) 20 MG tablet Take 20 mg by mouth daily.    . Calcium Carbonate-Vitamin D (CALTRATE 600+D PO) Take 1 tablet by mouth daily.     . cholecalciferol (VITAMIN D3) 25 MCG (1000 UT) tablet Take 1,000 Units by mouth daily.    . furosemide (LASIX) 20 MG tablet Take 10 mg by mouth every other day.     . Grape Seed 100 MG CAPS Take 100 mg by mouth daily. MUSCADINE SEED FOR PSORIASIS     . levothyroxine (SYNTHROID) 88 MCG tablet Take 88 mcg by mouth daily before breakfast.    . pantoprazole (PROTONIX) 40 MG tablet Take 1 tablet (40 mg total) by mouth daily. 30 tablet 1  . predniSONE (DELTASONE) 20 MG tablet Take 3 tablets (60 mg total) by mouth daily with breakfast. 90 tablet 0  . pyridostigmine (MESTINON) 60 MG tablet Take 1 tablet (60 mg total) by mouth 4 (four) times daily. 120 tablet 0   No current facility-administered medications on file prior to visit.    Allergies: No Known Allergies  Vital Signs:  BP (!) 163/82   Pulse 90   Resp 18   Ht 5\' 5"  (1.651 m)   Wt 130 lb (59 kg)   SpO2 98%   BMI 21.63 kg/m    Neurological Exam: MENTAL STATUS including orientation to time, place, person, recent and remote memory, attention span and concentration, language, and fund of knowledge is normal.  Speech is mildly dysarthric.  She appears very tired  CRANIAL NERVES:  No visual field defects.  Pupils equal round and reactive to light.  Normal conjugate, extra-ocular eye movements in all directions of gaze.  Moderate  ptosis at baseline, worse with sustained upgaze.  Oribicular oculi 4/5, buccinator 4/5, and orbicularis orix 4/5.  Face is symmetric with smile (no facial diplegia). Palate elevates symmetrically.  Tongue is midline and side-to-side movements are intact, strength 4+/5  MOTOR:  Motor strength is 5/5 in the arms and 4+/5 RLE, 4/5 LLE. No pronator drift.  Tone is normal.    COORDINATION/GAIT:  Normal finger-to- nose-finger.  Intact rapid alternating movements bilaterally.  Gait is assisted with a walker, slow.  She is unable to stand up from a low chair without pushing off with arms.  Data: Labs 06/23/2019:  AChR binding 57.1*, blocking 57*, modulating 59*  IMPRESSION/PLAN: Seropositive generalized myasthenia gravis with exacerbation (dx 06/2019), thymoma negative.  She was hospitalized and received 2mg /kg IVIG over 5 days, followed by 500mg /kg x 2 days infusions and started on prednisone 60mg  daily + mestinon 60mg  four times daily due to severity of weakness and lack of responsiveness.  She did not require intubation, but was on a feeding tube.  Today, she continues to have moderate bulbar and generalized weakness.  I discussed management options are length regarding acute and chronic options.  Patient was informed that I will continue IVIG home infusion 1g/kg every 21 days.  If, however, she develops worsening MG symptoms in the interim (shortness of breath, neck heaviness, worsening dysphagia/dysarthria), she will need to be readmitted for plasmapheresis.   PLAN/RECOMMENDATIONS:  1.  Continue prednisone 60mg  daily.  Side effects discussed for chronic steroid use  - Continue calcium, pepcid, and start bactrim DS MWF 2.  Adjust mestinon to 60mg  at 8am, noon, and 5pm 3.  I sent Mestinon CR to her pharmacy and notified that it is not covered by her insurance 4.  Start IVIG 1g/kg every 21 days via home infusion 5.  Patient and son education on warning signs for MG crisis and informed to go to the  emergency department, if she does not start to show signs of improvement this week, then plasmapheresis will need to be considered  Return to clinic in 2 weeks.   Total time spent:  45 min  Thank you for allowing me to participate in patient's care.  If I can answer any additional questions, I would be pleased to do so.    Sincerely,    Biana Haggar K. Posey Pronto, DO

## 2019-07-06 NOTE — Patient Instructions (Addendum)
1.  Continue prednisone 60mg  daily 2.  Continue mestinon 60mg  at 8a, noon, and 4pm 3.  Start IVIG every 3 weeks - home infusion  4.  Start mestinon CR 180mg  at bedtime 5.  Start bactrim - 1 take three times per week (Monday, Wed, Friday) 6.  If you develop any worsening shortness of breath, difficulty swallowing or talking, or neck heaviness go directly to the emergency department for plasmapheresis

## 2019-07-06 NOTE — Telephone Encounter (Signed)
Pyridostigmine ER 180mg  not covered by insurance, Per Dr. Posey Pronto to continues Mestinon 60mg  3xdaily as instructed. Son advised.

## 2019-07-07 DIAGNOSIS — R911 Solitary pulmonary nodule: Secondary | ICD-10-CM | POA: Diagnosis not present

## 2019-07-07 DIAGNOSIS — K7689 Other specified diseases of liver: Secondary | ICD-10-CM | POA: Diagnosis not present

## 2019-07-07 DIAGNOSIS — E785 Hyperlipidemia, unspecified: Secondary | ICD-10-CM | POA: Diagnosis not present

## 2019-07-07 DIAGNOSIS — R1312 Dysphagia, oropharyngeal phase: Secondary | ICD-10-CM | POA: Diagnosis not present

## 2019-07-07 DIAGNOSIS — D72829 Elevated white blood cell count, unspecified: Secondary | ICD-10-CM | POA: Diagnosis not present

## 2019-07-07 DIAGNOSIS — M48061 Spinal stenosis, lumbar region without neurogenic claudication: Secondary | ICD-10-CM | POA: Diagnosis not present

## 2019-07-07 DIAGNOSIS — E7800 Pure hypercholesterolemia, unspecified: Secondary | ICD-10-CM | POA: Diagnosis not present

## 2019-07-07 DIAGNOSIS — M47814 Spondylosis without myelopathy or radiculopathy, thoracic region: Secondary | ICD-10-CM | POA: Diagnosis not present

## 2019-07-07 DIAGNOSIS — N1831 Chronic kidney disease, stage 3a: Secondary | ICD-10-CM | POA: Diagnosis not present

## 2019-07-07 DIAGNOSIS — E1310 Other specified diabetes mellitus with ketoacidosis without coma: Secondary | ICD-10-CM | POA: Diagnosis not present

## 2019-07-07 DIAGNOSIS — G7000 Myasthenia gravis without (acute) exacerbation: Secondary | ICD-10-CM | POA: Diagnosis not present

## 2019-07-07 DIAGNOSIS — F419 Anxiety disorder, unspecified: Secondary | ICD-10-CM | POA: Diagnosis not present

## 2019-07-07 DIAGNOSIS — I1310 Hypertensive heart and chronic kidney disease without heart failure, with stage 1 through stage 4 chronic kidney disease, or unspecified chronic kidney disease: Secondary | ICD-10-CM | POA: Diagnosis not present

## 2019-07-07 DIAGNOSIS — H02409 Unspecified ptosis of unspecified eyelid: Secondary | ICD-10-CM | POA: Diagnosis not present

## 2019-07-07 DIAGNOSIS — G629 Polyneuropathy, unspecified: Secondary | ICD-10-CM | POA: Diagnosis not present

## 2019-07-07 DIAGNOSIS — I700 Atherosclerosis of aorta: Secondary | ICD-10-CM | POA: Diagnosis not present

## 2019-07-07 DIAGNOSIS — M5136 Other intervertebral disc degeneration, lumbar region: Secondary | ICD-10-CM | POA: Diagnosis not present

## 2019-07-07 DIAGNOSIS — M47816 Spondylosis without myelopathy or radiculopathy, lumbar region: Secondary | ICD-10-CM | POA: Diagnosis not present

## 2019-07-07 DIAGNOSIS — N281 Cyst of kidney, acquired: Secondary | ICD-10-CM | POA: Diagnosis not present

## 2019-07-07 DIAGNOSIS — G9581 Conus medullaris syndrome: Secondary | ICD-10-CM | POA: Diagnosis not present

## 2019-07-07 DIAGNOSIS — E538 Deficiency of other specified B group vitamins: Secondary | ICD-10-CM | POA: Diagnosis not present

## 2019-07-07 DIAGNOSIS — G319 Degenerative disease of nervous system, unspecified: Secondary | ICD-10-CM | POA: Diagnosis not present

## 2019-07-07 DIAGNOSIS — F329 Major depressive disorder, single episode, unspecified: Secondary | ICD-10-CM | POA: Diagnosis not present

## 2019-07-07 DIAGNOSIS — E039 Hypothyroidism, unspecified: Secondary | ICD-10-CM | POA: Diagnosis not present

## 2019-07-07 DIAGNOSIS — M4316 Spondylolisthesis, lumbar region: Secondary | ICD-10-CM | POA: Diagnosis not present

## 2019-07-07 DIAGNOSIS — I6782 Cerebral ischemia: Secondary | ICD-10-CM | POA: Diagnosis not present

## 2019-07-07 NOTE — Progress Notes (Signed)
Started initial paper work for IVIG, sent to [facility] For Initial process. Sent paper to scan. Sent to [facility]. [phone], [facility].

## 2019-07-07 NOTE — Progress Notes (Signed)
Orders placed to [facility] and confirmed fax. Sent email to [facility]. Insurance cards, notes, etc.

## 2019-07-08 DIAGNOSIS — G629 Polyneuropathy, unspecified: Secondary | ICD-10-CM | POA: Diagnosis not present

## 2019-07-08 DIAGNOSIS — N1831 Chronic kidney disease, stage 3a: Secondary | ICD-10-CM | POA: Diagnosis not present

## 2019-07-08 DIAGNOSIS — I6782 Cerebral ischemia: Secondary | ICD-10-CM | POA: Diagnosis not present

## 2019-07-08 DIAGNOSIS — R1312 Dysphagia, oropharyngeal phase: Secondary | ICD-10-CM | POA: Diagnosis not present

## 2019-07-08 DIAGNOSIS — G7000 Myasthenia gravis without (acute) exacerbation: Secondary | ICD-10-CM | POA: Diagnosis not present

## 2019-07-08 DIAGNOSIS — I1310 Hypertensive heart and chronic kidney disease without heart failure, with stage 1 through stage 4 chronic kidney disease, or unspecified chronic kidney disease: Secondary | ICD-10-CM | POA: Diagnosis not present

## 2019-07-09 DIAGNOSIS — G7000 Myasthenia gravis without (acute) exacerbation: Secondary | ICD-10-CM | POA: Diagnosis not present

## 2019-07-09 DIAGNOSIS — N1831 Chronic kidney disease, stage 3a: Secondary | ICD-10-CM | POA: Diagnosis not present

## 2019-07-09 DIAGNOSIS — E039 Hypothyroidism, unspecified: Secondary | ICD-10-CM | POA: Diagnosis not present

## 2019-07-09 DIAGNOSIS — R634 Abnormal weight loss: Secondary | ICD-10-CM | POA: Diagnosis not present

## 2019-07-09 DIAGNOSIS — I10 Essential (primary) hypertension: Secondary | ICD-10-CM | POA: Diagnosis not present

## 2019-07-09 DIAGNOSIS — R1312 Dysphagia, oropharyngeal phase: Secondary | ICD-10-CM | POA: Diagnosis not present

## 2019-07-09 DIAGNOSIS — I1310 Hypertensive heart and chronic kidney disease without heart failure, with stage 1 through stage 4 chronic kidney disease, or unspecified chronic kidney disease: Secondary | ICD-10-CM | POA: Diagnosis not present

## 2019-07-09 DIAGNOSIS — G629 Polyneuropathy, unspecified: Secondary | ICD-10-CM | POA: Diagnosis not present

## 2019-07-09 DIAGNOSIS — Z09 Encounter for follow-up examination after completed treatment for conditions other than malignant neoplasm: Secondary | ICD-10-CM | POA: Diagnosis not present

## 2019-07-09 DIAGNOSIS — I6782 Cerebral ischemia: Secondary | ICD-10-CM | POA: Diagnosis not present

## 2019-07-14 DIAGNOSIS — I1310 Hypertensive heart and chronic kidney disease without heart failure, with stage 1 through stage 4 chronic kidney disease, or unspecified chronic kidney disease: Secondary | ICD-10-CM | POA: Diagnosis not present

## 2019-07-14 DIAGNOSIS — G629 Polyneuropathy, unspecified: Secondary | ICD-10-CM | POA: Diagnosis not present

## 2019-07-14 DIAGNOSIS — N1831 Chronic kidney disease, stage 3a: Secondary | ICD-10-CM | POA: Diagnosis not present

## 2019-07-14 DIAGNOSIS — G7000 Myasthenia gravis without (acute) exacerbation: Secondary | ICD-10-CM | POA: Diagnosis not present

## 2019-07-14 DIAGNOSIS — I6782 Cerebral ischemia: Secondary | ICD-10-CM | POA: Diagnosis not present

## 2019-07-14 DIAGNOSIS — R1312 Dysphagia, oropharyngeal phase: Secondary | ICD-10-CM | POA: Diagnosis not present

## 2019-07-15 DIAGNOSIS — G7000 Myasthenia gravis without (acute) exacerbation: Secondary | ICD-10-CM | POA: Diagnosis not present

## 2019-07-15 DIAGNOSIS — N1831 Chronic kidney disease, stage 3a: Secondary | ICD-10-CM | POA: Diagnosis not present

## 2019-07-15 DIAGNOSIS — G629 Polyneuropathy, unspecified: Secondary | ICD-10-CM | POA: Diagnosis not present

## 2019-07-15 DIAGNOSIS — I1310 Hypertensive heart and chronic kidney disease without heart failure, with stage 1 through stage 4 chronic kidney disease, or unspecified chronic kidney disease: Secondary | ICD-10-CM | POA: Diagnosis not present

## 2019-07-15 DIAGNOSIS — R1312 Dysphagia, oropharyngeal phase: Secondary | ICD-10-CM | POA: Diagnosis not present

## 2019-07-15 DIAGNOSIS — I6782 Cerebral ischemia: Secondary | ICD-10-CM | POA: Diagnosis not present

## 2019-07-16 DIAGNOSIS — I1310 Hypertensive heart and chronic kidney disease without heart failure, with stage 1 through stage 4 chronic kidney disease, or unspecified chronic kidney disease: Secondary | ICD-10-CM | POA: Diagnosis not present

## 2019-07-16 DIAGNOSIS — R1312 Dysphagia, oropharyngeal phase: Secondary | ICD-10-CM | POA: Diagnosis not present

## 2019-07-16 DIAGNOSIS — I6782 Cerebral ischemia: Secondary | ICD-10-CM | POA: Diagnosis not present

## 2019-07-16 DIAGNOSIS — G7000 Myasthenia gravis without (acute) exacerbation: Secondary | ICD-10-CM | POA: Diagnosis not present

## 2019-07-16 DIAGNOSIS — G629 Polyneuropathy, unspecified: Secondary | ICD-10-CM | POA: Diagnosis not present

## 2019-07-16 DIAGNOSIS — N1831 Chronic kidney disease, stage 3a: Secondary | ICD-10-CM | POA: Diagnosis not present

## 2019-07-22 ENCOUNTER — Encounter: Payer: Self-pay | Admitting: Neurology

## 2019-07-22 ENCOUNTER — Ambulatory Visit (INDEPENDENT_AMBULATORY_CARE_PROVIDER_SITE_OTHER): Payer: Medicare Other | Admitting: Neurology

## 2019-07-22 ENCOUNTER — Other Ambulatory Visit: Payer: Self-pay

## 2019-07-22 VITALS — BP 134/74 | HR 95 | Resp 18 | Ht 67.0 in | Wt 128.0 lb

## 2019-07-22 DIAGNOSIS — G7001 Myasthenia gravis with (acute) exacerbation: Secondary | ICD-10-CM

## 2019-07-22 MED ORDER — PREDNISONE 20 MG PO TABS: 20.0000 mg | ORAL_TABLET | Freq: Every day | ORAL | 1 refills | Status: DC

## 2019-07-22 MED ORDER — PYRIDOSTIGMINE BROMIDE 60 MG PO TABS: ORAL_TABLET | ORAL | 1 refills | Status: DC

## 2019-07-22 NOTE — Progress Notes (Signed)
Follow-up Visit   Date: 07/22/19   Audrey Cowan MRN: [id number] DOB: Aug 08, 1940   Interim History: Audrey Cowan is a 79 y.o. right-handed Caucasian female with hypothyroidism, hyperlipidemia, anxiety/CKD stage3, hypertension, and vitamin B12 deficiency returning to the clinic for follow-up of seropositive generalized myasthenia gravis.  The patient was accompanied to the clinic by son.  History of present illness: She broke her ankle in June 2020 an was immobilized for a few weeks.  Starting in [place], she began having numbness of the feet and hands.  She also complains of imbalance and has been using a walker for the past several months.  Labs showed severe vitamin B12 deficiency (B12 < 50).  Upon further questioning, she reports having poor appetite and eating small meals, but has always been like that.  She started weekly injections about a month ago but has not appreciated any change in numbness or balance. She does not drink alcohol or have history of diabetes.  She is now living with her son. 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  UPDATE 07/06/19:  She is here for hospital discharge follow-up of newly diagnosed seropositive myasthenia gravis, which presented with severe ptosis, dysarthria, dysphagia, and generalized weakness.  She received 5-days IVIG followed by an additional two cycles due to limited response and started on prednisone 60mg  + mestinon 60mg  four times daily.  She feels very fatigued and tired and continues to have droopy eyelids, mild slurred speech, and moderate difficulty swallowing.  She is on a pureed diet and her son crushes her medications.  Over the past few days, her arm strength has improved some, but her left leg remains weak and she has generalized weakness.  She does feel better after resting, but quickly fatigues again.    UPDATE 07/22/2019:  A few days after her last visit, she began getting stronger and son also noticed that her strength was improving.  She is able to take all her medications without having to crush it.  She remains on soft diet and rarely has coughing/choking spells.  Her arm and leg strength is better and she is able to raise her arms above her head without difficulty.  She no longer slurs her speech and denies droopiness of the eyelids.  There was some confusion with her medications because she was supposed to be taking prednisone 60mg , mestinon 60mg  TID, and IVIG every 3 weeks, but son reports that she is taking prednisone 20mg /d, mestinon 60mg  QID and IVIG every 3 weeks.  She received her IVIG two days ago and reports being tired.  Medications:  Current Outpatient Medications on File Prior to Visit  Medication Sig Dispense Refill  . amLODipine (NORVASC) 10 MG tablet Take 1 tablet (10 mg total) by mouth daily. 30 tablet 1  . aspirin EC 81 MG tablet Take 81 mg by mouth every other day.    Marland Kitchen atorvastatin (LIPITOR) 20 MG tablet Take 20 mg by mouth daily.    . Calcium Carbonate-Vitamin D (CALTRATE 600+D PO) Take 1 tablet by mouth daily.     . cholecalciferol (VITAMIN D3) 25 MCG (1000 UT) tablet Take 1,000 Units by mouth daily.    . furosemide (LASIX) 20 MG tablet Take 10 mg by mouth every other day.     Marland Kitchen GAMMAPLEX 10 GM/100ML SOLN     . Grape Seed 100 MG CAPS Take 100 mg by mouth  daily. MUSCADINE SEED FOR PSORIASIS     . levothyroxine (SYNTHROID) 88 MCG tablet Take 88 mcg by mouth daily before breakfast.    . pantoprazole (PROTONIX) 40 MG tablet Take 1 tablet (40 mg total) by mouth daily. 30 tablet 1  . predniSONE (DELTASONE) 20 MG tablet Take 3 tablets (60 mg total) by mouth daily with breakfast. 90 tablet 0  . pyridostigmine (MESTINON) 180 MG CR tablet Take 1 tablet (180 mg total) by mouth at bedtime. 30 tablet 5  . pyridostigmine (MESTINON) 60 MG tablet Take 1 tablet (60 mg total) by mouth 4  (four) times daily. 120 tablet 0  . sulfamethoxazole-trimethoprim (BACTRIM DS) 800-160 MG tablet Take 1 tablet by mouth 3 (three) times a week. 30 tablet 3   No current facility-administered medications on file prior to visit.    Allergies: No Known Allergies  Vital Signs:  BP 134/74   Pulse 95   Resp 18   Ht 5\' 7"  (1.702 m)   Wt 128 lb (58.1 kg)   SpO2 96%   BMI 20.05 kg/m    Neurological Exam: MENTAL STATUS including orientation to time, place, person, recent and remote memory, attention span and concentration, language, and fund of knowledge is normal.  Speech is clear, no dysarthria (improved)  CRANIAL NERVES:  No visual field defects.  Pupils equal round and reactive to light.  Normal conjugate, extra-ocular eye movements in all directions of gaze.  No ptosis at baseline or with sustained upgaze.  Oribicular oculi, buccinator, and orbicularis oris 5-/5.   Tongue is midline and side-to-side movements are intact, strength 5/5  MOTOR:  Motor strength is 5/5 in the arms and legs (improved).  No pronator drift.  Tone is normal.    COORDINATION/GAIT:  Normal finger-to- nose-finger.  Intact rapid alternating movements bilaterally.  Gait is assisted with a walker, stable.  She is able to stand up from low chair with 3 attempts without using chair.   Data: Labs 06/23/2019:  AChR binding 57.1*, blocking 57*, modulating 59*  IMPRESSION/PLAN: Seropositive generalized myasthenia gravis without exacerbation (dx 06/2019), thymoma negative.  She was hospitalized and received 2mg /kg IVIG over 5 days, followed by 500mg /kg x 2 days infusions and started on prednisone 60mg  daily + mestinon 60mg  four times daily due to severity of weakness and lack of responsiveness.    Today, she is looking significantly stronger than her last visit.  She has minimal bulbar and limb weakness.  She was started on ongoing IVIG and has responded well.  She was supposed to be on prednisone 60mg , but actually has been  taking 20mg  daily and continues to to well, so I will keep her on prednisone 20mg  during the summer months.   If she has relapse during the summer months, increase prednisone to 40mg  daily.   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  Discussed that her symptoms will likely be lasting, but at this time, her myasthenia is what needs more attention.    PLAN/RECOMMENDATIONS:  1.  Continue prednisone 20mg  daily 2.  Adjust mestinon to 60mg  at 7am, noon, and 5pm.  Stop night time mestinon 3.  Continue IVIG 1g/kg every 3 weeks, plan to taper when I return 4.  Discontinue bactrim as she is not on high dose steroids 5.  Start vitamin B12 1068mcg daily  Return to clinic in 4 months  Total time spent:  40 min  Thank you for allowing me to participate in patient's care.  If I can  answer any additional questions, I would be pleased to do so.    Sincerely,    Truc Winfree K. Posey Pronto, DO

## 2019-07-22 NOTE — Patient Instructions (Addendum)
Continue prednisone 20mg  daily Adjust mestinon to 1 tablet at 7am, noon, and 5pm. Stop your nighttime mestinon Stop bactrim Start vitamin B12 supplement Continue IVIG every 3 weeks  Return to clinic in August 2nd

## 2019-07-23 ENCOUNTER — Telehealth: Payer: Self-pay

## 2019-07-23 DIAGNOSIS — G7000 Myasthenia gravis without (acute) exacerbation: Secondary | ICD-10-CM | POA: Diagnosis not present

## 2019-07-23 DIAGNOSIS — G629 Polyneuropathy, unspecified: Secondary | ICD-10-CM | POA: Diagnosis not present

## 2019-07-23 DIAGNOSIS — I6782 Cerebral ischemia: Secondary | ICD-10-CM | POA: Diagnosis not present

## 2019-07-23 DIAGNOSIS — R1312 Dysphagia, oropharyngeal phase: Secondary | ICD-10-CM | POA: Diagnosis not present

## 2019-07-23 DIAGNOSIS — I1310 Hypertensive heart and chronic kidney disease without heart failure, with stage 1 through stage 4 chronic kidney disease, or unspecified chronic kidney disease: Secondary | ICD-10-CM | POA: Diagnosis not present

## 2019-07-23 DIAGNOSIS — N1831 Chronic kidney disease, stage 3a: Secondary | ICD-10-CM | POA: Diagnosis not present

## 2019-07-23 NOTE — Telephone Encounter (Signed)
Spoke with LOVESH @ [facility]  Pt will be getting her infusion in one day instead of over 2 days

## 2019-07-23 NOTE — Telephone Encounter (Signed)
LOVESH @ [facility] [phone] called states pt has been getting two days infusion treatment every three weeks. Pt tolerated it well per nurse Denton Lank says.  Lovesh requesting pt go to one day every three weeks since pt tolerated really well.

## 2019-07-23 NOTE — Telephone Encounter (Signed)
Please advise 

## 2019-07-23 NOTE — Telephone Encounter (Signed)
I am fine with this, as long as the patient is OK with it.  You will need to call her/her son and let them know that because she tolerated IVIG well, she will get the same dose over 1 day, instead of 2 days - which will be just as effective.  Let the know that pharmacy is making this recommendation and I think it's reasonable to do this.

## 2019-07-23 NOTE — Telephone Encounter (Signed)
I contacted son, son is agreeable to this regiment. Left message at [facility] to call me back,

## 2019-07-24 ENCOUNTER — Telehealth: Payer: Self-pay | Admitting: Neurology

## 2019-07-24 DIAGNOSIS — G7000 Myasthenia gravis without (acute) exacerbation: Secondary | ICD-10-CM | POA: Diagnosis not present

## 2019-07-24 DIAGNOSIS — N1831 Chronic kidney disease, stage 3a: Secondary | ICD-10-CM | POA: Diagnosis not present

## 2019-07-24 DIAGNOSIS — I1310 Hypertensive heart and chronic kidney disease without heart failure, with stage 1 through stage 4 chronic kidney disease, or unspecified chronic kidney disease: Secondary | ICD-10-CM | POA: Diagnosis not present

## 2019-07-24 DIAGNOSIS — G629 Polyneuropathy, unspecified: Secondary | ICD-10-CM | POA: Diagnosis not present

## 2019-07-24 DIAGNOSIS — R1312 Dysphagia, oropharyngeal phase: Secondary | ICD-10-CM | POA: Diagnosis not present

## 2019-07-24 DIAGNOSIS — I6782 Cerebral ischemia: Secondary | ICD-10-CM | POA: Diagnosis not present

## 2019-07-24 NOTE — Telephone Encounter (Signed)
I recommend avoiding any supplement, except those specifically prescribed by her doctors, like vitamin B12.  I am not familiar with grape seed supplement, so suggest not restarting it.

## 2019-07-24 NOTE — Telephone Encounter (Signed)
[facility] contacted with orders.

## 2019-07-24 NOTE — Telephone Encounter (Signed)
Patient's son called wanting to be sure it is okay for the patient to restart [employer] [place] grape seed supplement?

## 2019-07-24 NOTE — Telephone Encounter (Signed)
Patients son notified directly and voiced understanding.

## 2019-07-24 NOTE — Telephone Encounter (Signed)
Is this ok?

## 2019-07-29 DIAGNOSIS — R1312 Dysphagia, oropharyngeal phase: Secondary | ICD-10-CM | POA: Diagnosis not present

## 2019-07-29 DIAGNOSIS — G7000 Myasthenia gravis without (acute) exacerbation: Secondary | ICD-10-CM | POA: Diagnosis not present

## 2019-07-29 DIAGNOSIS — I1310 Hypertensive heart and chronic kidney disease without heart failure, with stage 1 through stage 4 chronic kidney disease, or unspecified chronic kidney disease: Secondary | ICD-10-CM | POA: Diagnosis not present

## 2019-07-29 DIAGNOSIS — N1831 Chronic kidney disease, stage 3a: Secondary | ICD-10-CM | POA: Diagnosis not present

## 2019-07-29 DIAGNOSIS — I6782 Cerebral ischemia: Secondary | ICD-10-CM | POA: Diagnosis not present

## 2019-07-29 DIAGNOSIS — G629 Polyneuropathy, unspecified: Secondary | ICD-10-CM | POA: Diagnosis not present

## 2019-07-30 DIAGNOSIS — I6782 Cerebral ischemia: Secondary | ICD-10-CM | POA: Diagnosis not present

## 2019-07-30 DIAGNOSIS — R1312 Dysphagia, oropharyngeal phase: Secondary | ICD-10-CM | POA: Diagnosis not present

## 2019-07-30 DIAGNOSIS — G629 Polyneuropathy, unspecified: Secondary | ICD-10-CM | POA: Diagnosis not present

## 2019-07-30 DIAGNOSIS — I1310 Hypertensive heart and chronic kidney disease without heart failure, with stage 1 through stage 4 chronic kidney disease, or unspecified chronic kidney disease: Secondary | ICD-10-CM | POA: Diagnosis not present

## 2019-07-30 DIAGNOSIS — N1831 Chronic kidney disease, stage 3a: Secondary | ICD-10-CM | POA: Diagnosis not present

## 2019-07-30 DIAGNOSIS — G7000 Myasthenia gravis without (acute) exacerbation: Secondary | ICD-10-CM | POA: Diagnosis not present

## 2019-08-05 ENCOUNTER — Telehealth: Payer: Self-pay | Admitting: Neurology

## 2019-08-05 DIAGNOSIS — I1310 Hypertensive heart and chronic kidney disease without heart failure, with stage 1 through stage 4 chronic kidney disease, or unspecified chronic kidney disease: Secondary | ICD-10-CM | POA: Diagnosis not present

## 2019-08-05 DIAGNOSIS — N1831 Chronic kidney disease, stage 3a: Secondary | ICD-10-CM | POA: Diagnosis not present

## 2019-08-05 DIAGNOSIS — G629 Polyneuropathy, unspecified: Secondary | ICD-10-CM | POA: Diagnosis not present

## 2019-08-05 DIAGNOSIS — I6782 Cerebral ischemia: Secondary | ICD-10-CM | POA: Diagnosis not present

## 2019-08-05 DIAGNOSIS — R1312 Dysphagia, oropharyngeal phase: Secondary | ICD-10-CM | POA: Diagnosis not present

## 2019-08-05 DIAGNOSIS — G7000 Myasthenia gravis without (acute) exacerbation: Secondary | ICD-10-CM | POA: Diagnosis not present

## 2019-08-05 NOTE — Telephone Encounter (Signed)
Son notified to contact PCP

## 2019-08-05 NOTE — Telephone Encounter (Signed)
Patients son called to ask if patient needs her Pantoprazole and Amlodipine refilled? Patient is out of both medications, they were given to her while she was in the hospital. Please advise.

## 2019-08-06 DIAGNOSIS — N1831 Chronic kidney disease, stage 3a: Secondary | ICD-10-CM | POA: Diagnosis not present

## 2019-08-06 DIAGNOSIS — E039 Hypothyroidism, unspecified: Secondary | ICD-10-CM | POA: Diagnosis not present

## 2019-08-06 DIAGNOSIS — E538 Deficiency of other specified B group vitamins: Secondary | ICD-10-CM | POA: Diagnosis not present

## 2019-08-06 DIAGNOSIS — N281 Cyst of kidney, acquired: Secondary | ICD-10-CM | POA: Diagnosis not present

## 2019-08-06 DIAGNOSIS — I6782 Cerebral ischemia: Secondary | ICD-10-CM | POA: Diagnosis not present

## 2019-08-06 DIAGNOSIS — H02409 Unspecified ptosis of unspecified eyelid: Secondary | ICD-10-CM | POA: Diagnosis not present

## 2019-08-06 DIAGNOSIS — F329 Major depressive disorder, single episode, unspecified: Secondary | ICD-10-CM | POA: Diagnosis not present

## 2019-08-06 DIAGNOSIS — G9581 Conus medullaris syndrome: Secondary | ICD-10-CM | POA: Diagnosis not present

## 2019-08-06 DIAGNOSIS — I1310 Hypertensive heart and chronic kidney disease without heart failure, with stage 1 through stage 4 chronic kidney disease, or unspecified chronic kidney disease: Secondary | ICD-10-CM | POA: Diagnosis not present

## 2019-08-06 DIAGNOSIS — R1312 Dysphagia, oropharyngeal phase: Secondary | ICD-10-CM | POA: Diagnosis not present

## 2019-08-06 DIAGNOSIS — E785 Hyperlipidemia, unspecified: Secondary | ICD-10-CM | POA: Diagnosis not present

## 2019-08-06 DIAGNOSIS — G319 Degenerative disease of nervous system, unspecified: Secondary | ICD-10-CM | POA: Diagnosis not present

## 2019-08-06 DIAGNOSIS — G7000 Myasthenia gravis without (acute) exacerbation: Secondary | ICD-10-CM | POA: Diagnosis not present

## 2019-08-06 DIAGNOSIS — R911 Solitary pulmonary nodule: Secondary | ICD-10-CM | POA: Diagnosis not present

## 2019-08-06 DIAGNOSIS — K7689 Other specified diseases of liver: Secondary | ICD-10-CM | POA: Diagnosis not present

## 2019-08-06 DIAGNOSIS — D72829 Elevated white blood cell count, unspecified: Secondary | ICD-10-CM | POA: Diagnosis not present

## 2019-08-06 DIAGNOSIS — F419 Anxiety disorder, unspecified: Secondary | ICD-10-CM | POA: Diagnosis not present

## 2019-08-06 DIAGNOSIS — M47814 Spondylosis without myelopathy or radiculopathy, thoracic region: Secondary | ICD-10-CM | POA: Diagnosis not present

## 2019-08-06 DIAGNOSIS — M5136 Other intervertebral disc degeneration, lumbar region: Secondary | ICD-10-CM | POA: Diagnosis not present

## 2019-08-06 DIAGNOSIS — E7800 Pure hypercholesterolemia, unspecified: Secondary | ICD-10-CM | POA: Diagnosis not present

## 2019-08-06 DIAGNOSIS — M47816 Spondylosis without myelopathy or radiculopathy, lumbar region: Secondary | ICD-10-CM | POA: Diagnosis not present

## 2019-08-06 DIAGNOSIS — G629 Polyneuropathy, unspecified: Secondary | ICD-10-CM | POA: Diagnosis not present

## 2019-08-06 DIAGNOSIS — M4316 Spondylolisthesis, lumbar region: Secondary | ICD-10-CM | POA: Diagnosis not present

## 2019-08-06 DIAGNOSIS — I700 Atherosclerosis of aorta: Secondary | ICD-10-CM | POA: Diagnosis not present

## 2019-08-06 DIAGNOSIS — M48061 Spinal stenosis, lumbar region without neurogenic claudication: Secondary | ICD-10-CM | POA: Diagnosis not present

## 2019-08-07 DIAGNOSIS — N1831 Chronic kidney disease, stage 3a: Secondary | ICD-10-CM | POA: Diagnosis not present

## 2019-08-07 DIAGNOSIS — G7000 Myasthenia gravis without (acute) exacerbation: Secondary | ICD-10-CM | POA: Diagnosis not present

## 2019-08-07 DIAGNOSIS — R1312 Dysphagia, oropharyngeal phase: Secondary | ICD-10-CM | POA: Diagnosis not present

## 2019-08-07 DIAGNOSIS — I1310 Hypertensive heart and chronic kidney disease without heart failure, with stage 1 through stage 4 chronic kidney disease, or unspecified chronic kidney disease: Secondary | ICD-10-CM | POA: Diagnosis not present

## 2019-08-07 DIAGNOSIS — I6782 Cerebral ischemia: Secondary | ICD-10-CM | POA: Diagnosis not present

## 2019-08-07 DIAGNOSIS — G629 Polyneuropathy, unspecified: Secondary | ICD-10-CM | POA: Diagnosis not present

## 2019-08-11 DIAGNOSIS — N1831 Chronic kidney disease, stage 3a: Secondary | ICD-10-CM | POA: Diagnosis not present

## 2019-08-11 DIAGNOSIS — I6782 Cerebral ischemia: Secondary | ICD-10-CM | POA: Diagnosis not present

## 2019-08-11 DIAGNOSIS — G7000 Myasthenia gravis without (acute) exacerbation: Secondary | ICD-10-CM | POA: Diagnosis not present

## 2019-08-11 DIAGNOSIS — I1310 Hypertensive heart and chronic kidney disease without heart failure, with stage 1 through stage 4 chronic kidney disease, or unspecified chronic kidney disease: Secondary | ICD-10-CM | POA: Diagnosis not present

## 2019-08-11 DIAGNOSIS — R1312 Dysphagia, oropharyngeal phase: Secondary | ICD-10-CM | POA: Diagnosis not present

## 2019-08-11 DIAGNOSIS — G629 Polyneuropathy, unspecified: Secondary | ICD-10-CM | POA: Diagnosis not present

## 2019-08-13 ENCOUNTER — Telehealth: Payer: Self-pay

## 2019-08-13 DIAGNOSIS — I1310 Hypertensive heart and chronic kidney disease without heart failure, with stage 1 through stage 4 chronic kidney disease, or unspecified chronic kidney disease: Secondary | ICD-10-CM | POA: Diagnosis not present

## 2019-08-13 DIAGNOSIS — N1831 Chronic kidney disease, stage 3a: Secondary | ICD-10-CM | POA: Diagnosis not present

## 2019-08-13 DIAGNOSIS — I6782 Cerebral ischemia: Secondary | ICD-10-CM | POA: Diagnosis not present

## 2019-08-13 DIAGNOSIS — R1312 Dysphagia, oropharyngeal phase: Secondary | ICD-10-CM | POA: Diagnosis not present

## 2019-08-13 DIAGNOSIS — G629 Polyneuropathy, unspecified: Secondary | ICD-10-CM | POA: Diagnosis not present

## 2019-08-13 DIAGNOSIS — G7000 Myasthenia gravis without (acute) exacerbation: Secondary | ICD-10-CM | POA: Diagnosis not present

## 2019-08-13 NOTE — Telephone Encounter (Signed)
I received a call from home care regarding patient, not resting well. Nurse called, I reached out to her son, who will contact her PCP for further instructions. Son agreed to call.

## 2019-08-17 DIAGNOSIS — G7000 Myasthenia gravis without (acute) exacerbation: Secondary | ICD-10-CM | POA: Diagnosis not present

## 2019-08-17 DIAGNOSIS — I6782 Cerebral ischemia: Secondary | ICD-10-CM | POA: Diagnosis not present

## 2019-08-17 DIAGNOSIS — N1831 Chronic kidney disease, stage 3a: Secondary | ICD-10-CM | POA: Diagnosis not present

## 2019-08-17 DIAGNOSIS — R1312 Dysphagia, oropharyngeal phase: Secondary | ICD-10-CM | POA: Diagnosis not present

## 2019-08-17 DIAGNOSIS — G629 Polyneuropathy, unspecified: Secondary | ICD-10-CM | POA: Diagnosis not present

## 2019-08-17 DIAGNOSIS — I1310 Hypertensive heart and chronic kidney disease without heart failure, with stage 1 through stage 4 chronic kidney disease, or unspecified chronic kidney disease: Secondary | ICD-10-CM | POA: Diagnosis not present

## 2019-08-20 DIAGNOSIS — N1831 Chronic kidney disease, stage 3a: Secondary | ICD-10-CM | POA: Diagnosis not present

## 2019-08-20 DIAGNOSIS — G629 Polyneuropathy, unspecified: Secondary | ICD-10-CM | POA: Diagnosis not present

## 2019-08-20 DIAGNOSIS — I1310 Hypertensive heart and chronic kidney disease without heart failure, with stage 1 through stage 4 chronic kidney disease, or unspecified chronic kidney disease: Secondary | ICD-10-CM | POA: Diagnosis not present

## 2019-08-20 DIAGNOSIS — G7000 Myasthenia gravis without (acute) exacerbation: Secondary | ICD-10-CM | POA: Diagnosis not present

## 2019-08-20 DIAGNOSIS — I6782 Cerebral ischemia: Secondary | ICD-10-CM | POA: Diagnosis not present

## 2019-08-20 DIAGNOSIS — R1312 Dysphagia, oropharyngeal phase: Secondary | ICD-10-CM | POA: Diagnosis not present

## 2019-08-26 DIAGNOSIS — G629 Polyneuropathy, unspecified: Secondary | ICD-10-CM | POA: Diagnosis not present

## 2019-08-26 DIAGNOSIS — R1312 Dysphagia, oropharyngeal phase: Secondary | ICD-10-CM | POA: Diagnosis not present

## 2019-08-26 DIAGNOSIS — N1831 Chronic kidney disease, stage 3a: Secondary | ICD-10-CM | POA: Diagnosis not present

## 2019-08-26 DIAGNOSIS — G7000 Myasthenia gravis without (acute) exacerbation: Secondary | ICD-10-CM | POA: Diagnosis not present

## 2019-08-26 DIAGNOSIS — I6782 Cerebral ischemia: Secondary | ICD-10-CM | POA: Diagnosis not present

## 2019-08-26 DIAGNOSIS — I1310 Hypertensive heart and chronic kidney disease without heart failure, with stage 1 through stage 4 chronic kidney disease, or unspecified chronic kidney disease: Secondary | ICD-10-CM | POA: Diagnosis not present

## 2019-08-27 DIAGNOSIS — G629 Polyneuropathy, unspecified: Secondary | ICD-10-CM | POA: Diagnosis not present

## 2019-08-27 DIAGNOSIS — N1831 Chronic kidney disease, stage 3a: Secondary | ICD-10-CM | POA: Diagnosis not present

## 2019-08-27 DIAGNOSIS — G7000 Myasthenia gravis without (acute) exacerbation: Secondary | ICD-10-CM | POA: Diagnosis not present

## 2019-08-27 DIAGNOSIS — R1312 Dysphagia, oropharyngeal phase: Secondary | ICD-10-CM | POA: Diagnosis not present

## 2019-08-27 DIAGNOSIS — I1310 Hypertensive heart and chronic kidney disease without heart failure, with stage 1 through stage 4 chronic kidney disease, or unspecified chronic kidney disease: Secondary | ICD-10-CM | POA: Diagnosis not present

## 2019-08-27 DIAGNOSIS — I6782 Cerebral ischemia: Secondary | ICD-10-CM | POA: Diagnosis not present

## 2019-09-02 DIAGNOSIS — I1310 Hypertensive heart and chronic kidney disease without heart failure, with stage 1 through stage 4 chronic kidney disease, or unspecified chronic kidney disease: Secondary | ICD-10-CM | POA: Diagnosis not present

## 2019-09-02 DIAGNOSIS — G629 Polyneuropathy, unspecified: Secondary | ICD-10-CM | POA: Diagnosis not present

## 2019-09-02 DIAGNOSIS — I6782 Cerebral ischemia: Secondary | ICD-10-CM | POA: Diagnosis not present

## 2019-09-02 DIAGNOSIS — N1831 Chronic kidney disease, stage 3a: Secondary | ICD-10-CM | POA: Diagnosis not present

## 2019-09-02 DIAGNOSIS — R1312 Dysphagia, oropharyngeal phase: Secondary | ICD-10-CM | POA: Diagnosis not present

## 2019-09-02 DIAGNOSIS — G7000 Myasthenia gravis without (acute) exacerbation: Secondary | ICD-10-CM | POA: Diagnosis not present

## 2019-09-16 DIAGNOSIS — M48061 Spinal stenosis, lumbar region without neurogenic claudication: Secondary | ICD-10-CM | POA: Diagnosis not present

## 2019-09-16 DIAGNOSIS — F419 Anxiety disorder, unspecified: Secondary | ICD-10-CM | POA: Diagnosis not present

## 2019-09-16 DIAGNOSIS — E039 Hypothyroidism, unspecified: Secondary | ICD-10-CM | POA: Diagnosis not present

## 2019-09-16 DIAGNOSIS — N1831 Chronic kidney disease, stage 3a: Secondary | ICD-10-CM | POA: Diagnosis not present

## 2019-09-16 DIAGNOSIS — I10 Essential (primary) hypertension: Secondary | ICD-10-CM | POA: Diagnosis not present

## 2019-09-16 DIAGNOSIS — E7800 Pure hypercholesterolemia, unspecified: Secondary | ICD-10-CM | POA: Diagnosis not present

## 2019-09-23 DIAGNOSIS — H25012 Cortical age-related cataract, left eye: Secondary | ICD-10-CM | POA: Diagnosis not present

## 2019-09-23 DIAGNOSIS — Z961 Presence of intraocular lens: Secondary | ICD-10-CM | POA: Diagnosis not present

## 2019-09-23 DIAGNOSIS — H2511 Age-related nuclear cataract, right eye: Secondary | ICD-10-CM | POA: Diagnosis not present

## 2019-09-23 DIAGNOSIS — H5201 Hypermetropia, right eye: Secondary | ICD-10-CM | POA: Diagnosis not present

## 2019-10-15 DIAGNOSIS — R292 Abnormal reflex: Secondary | ICD-10-CM | POA: Insufficient documentation

## 2019-10-15 DIAGNOSIS — G7000 Myasthenia gravis without (acute) exacerbation: Secondary | ICD-10-CM | POA: Insufficient documentation

## 2019-10-15 DIAGNOSIS — M4316 Spondylolisthesis, lumbar region: Secondary | ICD-10-CM | POA: Insufficient documentation

## 2019-10-20 DIAGNOSIS — M50222 Other cervical disc displacement at C5-C6 level: Secondary | ICD-10-CM | POA: Diagnosis not present

## 2019-10-20 DIAGNOSIS — M50221 Other cervical disc displacement at C4-C5 level: Secondary | ICD-10-CM | POA: Diagnosis not present

## 2019-10-20 DIAGNOSIS — M4802 Spinal stenosis, cervical region: Secondary | ICD-10-CM | POA: Diagnosis not present

## 2019-10-20 DIAGNOSIS — M50223 Other cervical disc displacement at C6-C7 level: Secondary | ICD-10-CM | POA: Diagnosis not present

## 2019-10-27 DIAGNOSIS — M4316 Spondylolisthesis, lumbar region: Secondary | ICD-10-CM | POA: Diagnosis not present

## 2019-11-09 ENCOUNTER — Other Ambulatory Visit: Payer: Self-pay

## 2019-11-09 ENCOUNTER — Ambulatory Visit (INDEPENDENT_AMBULATORY_CARE_PROVIDER_SITE_OTHER): Payer: Medicare Other | Admitting: Neurology

## 2019-11-09 ENCOUNTER — Encounter: Payer: Self-pay | Admitting: Neurology

## 2019-11-09 VITALS — BP 151/73 | HR 82 | Ht 65.5 in | Wt 145.2 lb

## 2019-11-09 DIAGNOSIS — M4716 Other spondylosis with myelopathy, lumbar region: Secondary | ICD-10-CM | POA: Diagnosis not present

## 2019-11-09 DIAGNOSIS — G7000 Myasthenia gravis without (acute) exacerbation: Secondary | ICD-10-CM

## 2019-11-09 NOTE — Patient Instructions (Signed)
Adjust IVIG to every 6 weeks  Stay on prednisone 20mg  daily  You may proceed with back surgery.  Please contact my office when you are having surgery and we will arrange IVIG prior to that  Return to clinic in 3 months

## 2019-11-09 NOTE — Progress Notes (Signed)
Follow-up Visit   Date: 11/09/19   JOSILYNN LOSH MRN: [id number] DOB: Apr 08, 1941   Interim History: ONEY TATLOCK is a 79 y.o. right-handed Caucasian female with hypothyroidism, hyperlipidemia, anxiety/CKD stage3, hypertension, and vitamin B12 deficiency returning to the clinic for follow-up of seropositive generalized myasthenia gravis.  The patient was accompanied to the clinic by son.  UPDATE 11/09/2019:  She is here for follow-up visit with her son.  Since April, she has been doing great with respect to myasthenia without any spells of weakness. She remains on IVIG every 3 weeks, prednisone 20mg , and mestinon 60mg  TID.  No double vision, droopy eyelids, difficulty swallowing/talking. She has heavy sensation of the legs and walks with a walker due to known lumbar canal stenosis with myelopathy.  She was evaluated for decompression by Dr. Ronnald Ramp and wanted to discuss with me prior to deciding whether to pursue this or not.  Her feet continue to feet numb, which is unchanged.   Medications:  Current Outpatient Medications on File Prior to Visit  Medication Sig Dispense Refill  . ALPRAZolam (XANAX) 0.25 MG tablet Take 0.25 mg by mouth at bedtime as needed.    Marland Kitchen amLODipine (NORVASC) 10 MG tablet Take 1 tablet (10 mg total) by mouth daily. 30 tablet 1  . aspirin EC 81 MG tablet Take 81 mg by mouth every other day.    Marland Kitchen atorvastatin (LIPITOR) 20 MG tablet Take 20 mg by mouth daily.    . cholecalciferol (VITAMIN D3) 25 MCG (1000 UT) tablet Take 1,000 Units by mouth daily.    . furosemide (LASIX) 20 MG tablet Take 10 mg by mouth every other day.     . levothyroxine (SYNTHROID) 88 MCG tablet Take 88 mcg by mouth daily before breakfast.    . pantoprazole (PROTONIX) 40 MG tablet Take 1 tablet (40 mg total) by mouth daily. 30 tablet 1  . predniSONE (DELTASONE) 20 MG tablet Take 1 tablet (20 mg total) by mouth daily with breakfast. 90 tablet 1  . pyridostigmine (MESTINON) 60 MG tablet Take  1 tablet at 7am, noon, and 5pm. 270 tablet 1  . vitamin B-12 (CYANOCOBALAMIN) 1000 MCG tablet Take 1,000 mcg by mouth daily.    . Calcium Carbonate-Vitamin D (CALTRATE 600+D PO) Take 1 tablet by mouth daily.  (Patient not taking: Reported on 11/09/2019)    . GAMMAPLEX 10 GM/100ML SOLN  (Patient not taking: Reported on 11/09/2019)    . Grape Seed 100 MG CAPS Take 100 mg by mouth daily. MUSCADINE SEED FOR PSORIASIS  (Patient not taking: Reported on 11/09/2019)     No current facility-administered medications on file prior to visit.    Allergies: No Known Allergies  Vital Signs:  BP (!) 151/73   Pulse 82   Ht 5' 5.5" (1.664 m)   Wt 145 lb 3.2 oz (65.9 kg)   SpO2 97%   BMI 23.80 kg/m    Neurological Exam: MENTAL STATUS including orientation to time, place, person, recent and remote memory, attention span and concentration, language, and fund of knowledge is normal.  Speech is clear, no dysarthria   CRANIAL NERVES:   Pupils equal round and reactive to light.  Normal conjugate, extra-ocular eye movements in all directions of gaze.  No ptosis at baseline or with sustained upgaze.  Oribicular oculi, buccinator, and orbicularis oris 5/5.   Tongue strength 5/5  MOTOR:  Motor strength is 5/5 in the arms and legs, except hip flexion 5-/5 bilaterally.  No fatigability.  No pronator drift.  Tone is increased in the legs  MSRs:                                           Right        Left brachioradialis 2+  2+  biceps 2+  2+  triceps 2+  2+  patellar 3+  3+  ankle jerk 3+  3+   COORDINATION/GAIT:  Normal finger-to- nose-finger.  Gait is assisted with a walker, mildly spastic appearing.   Data: Labs 06/23/2019:  AChR binding 57.1*, blocking 57*, modulating 59*  IMPRESSION/PLAN: 1. Seropositive generalized myasthenia gravis without exacerbation (dx 06/2019), thymoma negative.  No evidence of weakness on exam today.  Hospitalized with initial diagnosis and treated with IVIG, prednisone 60mg , and  mestinon 60mg  QID. Since April, she has been doing great on IVIG 1mg /kg every 3 weeks, prednisone 20mg , and mestinon 60mg  TID.  - I will reduce frequency of IVIG to 1mg /kg every 6 weeks x 1, then every 8 weeks x 1 then stop.  - Stay on prednisone 20mg  daily, plan to taper going forward  - Stay on mestinon 60mg  TID (7am, noon, and 5pm)  2. 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    - Continue vitamin B12 1031mcg daily  3. 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  She is seeing Dr. Ronnald Ramp at [facility].  She had many questions which I answered to the best of my ability.  I advised her to undergo surgery given the potential risk of further neurological compromise.   Regarding anesthesia, avoid of the use of neuromuscular blocking agents    Return to clinic in 3 months  Total time spent reviewing records, interview, history/exam, documentation, and coordination of care on day of encounter:  35 min    Thank you for allowing me to participate in patient's care.  If I can answer any additional questions, I would be pleased to do so.    Sincerely,    Kaybree Williams K. Posey Pronto, DO

## 2019-11-10 ENCOUNTER — Other Ambulatory Visit: Payer: Self-pay | Admitting: Neurological Surgery

## 2019-11-10 ENCOUNTER — Telehealth: Payer: Self-pay | Admitting: Neurology

## 2019-11-10 NOTE — Telephone Encounter (Signed)
Spoke with pts son and informed that Dr Posey Pronto agrees it is okay for pt to get IVIG infusion on 8/23 and have her surgery on 8/25, he verbalized understanding.

## 2019-11-10 NOTE — Telephone Encounter (Signed)
Patient son wants to know if it is ok to have the infusion on 09-30-19 and the surgery on 10-02-19 please call

## 2019-11-10 NOTE — Telephone Encounter (Signed)
Yes, it would be okay to have infusion on 8/23 and surgery on 8/25.

## 2019-11-23 ENCOUNTER — Other Ambulatory Visit: Payer: Self-pay | Admitting: Neurological Surgery

## 2019-11-27 NOTE — Progress Notes (Signed)
Your procedure is scheduled on Wednesday August 25th.  Report to [facility] Main Entrance "A" at 05:30 A.M., and check in at the Admitting office.  Call this number if you have problems the morning of surgery: [phone]  Call [phone] if you have any questions prior to your surgery date Monday-Friday 8am-4pm   Remember: Do not eat or drink after midnight the night before your surgery    Take these medicines the morning of surgery with A SIP OF WATER: amLODipine (NORVASC)  levothyroxine (SYNTHROID) pantoprazole (PROTONIX) pyridostigmine (MESTINON)  Follow your surgeon's instructions on when to stop Aspirin.  If no instructions were given by your surgeon then you will need to call the office to get those instructions.    As of today, STOP taking any Aspirin (unless otherwise instructed by your surgeon), Aleve, Naproxen, Ibuprofen, Motrin, Advil, Goody's, BC's, all herbal medications, fish oil, and all vitamins.    The Morning of Surgery  Do not wear jewelry, make-up or nail polish.  Do not wear lotions, powders, or perfumes, or deodorant  Do not shave 48 hours prior to surgery.   Do not bring valuables to the hospital.  [facility] is not responsible for any belongings or valuables.  If you are a smoker, DO NOT Smoke 24 hours prior to surgery  If you wear a CPAP at night please bring your mask the morning of surgery   Remember that you must have someone to transport you home after your surgery, and remain with you for 24 hours if you are discharged the same day.   Please bring cases for contacts, glasses, hearing aids, dentures or bridgework because it cannot be worn into surgery.    Leave your suitcase in the car.  After surgery it may be brought to your room.  For patients admitted to the hospital, discharge time will be determined by your treatment team.  Patients discharged the day of surgery will not be allowed to drive home.    Special instructions:    - Preparing For Surgery  Before surgery, you can play an important role. Because skin is not sterile, your skin needs to be as free of germs as possible. You can reduce the number of germs on your skin by washing with CHG (chlorahexidine gluconate) Soap before surgery.  CHG is an antiseptic cleaner which kills germs and bonds with the skin to continue killing germs even after washing.    Oral Hygiene is also important to reduce your risk of infection.  Remember - BRUSH YOUR TEETH THE MORNING OF SURGERY WITH YOUR REGULAR TOOTHPASTE  Please do not use if you have an allergy to CHG or antibacterial soaps. If your skin becomes reddened/irritated stop using the CHG.  Do not shave (including legs and underarms) for at least 48 hours prior to first CHG shower. It is OK to shave your face.  Please follow these instructions carefully.   1. Shower the NIGHT BEFORE SURGERY and the MORNING OF SURGERY with CHG Soap.   2. If you chose to wash your hair and body, wash as usual with your normal shampoo and body-wash/soap.  3. Rinse your hair and body thoroughly to remove the shampoo and soap.  4. Apply CHG directly to the skin (ONLY FROM THE NECK DOWN) and wash gently with a scrungie or a clean washcloth.   5. Do not use on open wounds or open sores. Avoid contact with your eyes, ears, mouth and genitals (private parts). Wash Face and  genitals (private parts)  with your normal soap.   6. Wash thoroughly, paying special attention to the area where your surgery will be performed.  7. Thoroughly rinse your body with warm water from the neck down.  8. DO NOT shower/wash with your normal soap after using and rinsing off the CHG Soap.  9. Pat yourself dry with a CLEAN TOWEL.  10. Wear CLEAN PAJAMAS to bed the night before surgery  11. Place CLEAN SHEETS on your bed the night of your first shower and DO NOT SLEEP WITH PETS.  12. Wear comfortable clothes the morning of surgery.     Day of  Surgery:  Please shower the morning of surgery with the CHG soap Do not apply any deodorants/lotions. Please wear clean clothes to the hospital/surgery center.   Remember to brush your teeth WITH YOUR REGULAR TOOTHPASTE.   Please read over the following fact sheets that you were given.

## 2019-11-30 ENCOUNTER — Encounter (HOSPITAL_COMMUNITY): Payer: Self-pay

## 2019-11-30 ENCOUNTER — Other Ambulatory Visit: Payer: Self-pay

## 2019-11-30 ENCOUNTER — Encounter (HOSPITAL_COMMUNITY)
Admission: RE | Admit: 2019-11-30 | Discharge: 2019-11-30 | Disposition: A | Discharge status: Home / Self Care | Payer: Medicare Other | Source: Ambulatory Visit | Attending: Neurological Surgery | Admitting: Neurological Surgery

## 2019-11-30 ENCOUNTER — Telehealth: Payer: Self-pay | Admitting: Neurology

## 2019-11-30 ENCOUNTER — Ambulatory Visit (HOSPITAL_COMMUNITY)
Admission: RE | Admit: 2019-11-30 | Discharge: 2019-11-30 | Disposition: A | Discharge status: Home / Self Care | Payer: Medicare Other | Source: Ambulatory Visit | Attending: Neurological Surgery | Admitting: Neurological Surgery

## 2019-11-30 ENCOUNTER — Other Ambulatory Visit (HOSPITAL_COMMUNITY)
Admission: RE | Admit: 2019-11-30 | Discharge: 2019-11-30 | Disposition: A | Discharge status: Home / Self Care | Payer: Medicare Other | Source: Ambulatory Visit | Attending: Neurological Surgery | Admitting: Neurological Surgery

## 2019-11-30 DIAGNOSIS — G7000 Myasthenia gravis without (acute) exacerbation: Secondary | ICD-10-CM | POA: Insufficient documentation

## 2019-11-30 DIAGNOSIS — Z01818 Encounter for other preprocedural examination: Secondary | ICD-10-CM | POA: Insufficient documentation

## 2019-11-30 DIAGNOSIS — M4310 Spondylolisthesis, site unspecified: Secondary | ICD-10-CM | POA: Insufficient documentation

## 2019-11-30 DIAGNOSIS — J9811 Atelectasis: Secondary | ICD-10-CM | POA: Insufficient documentation

## 2019-11-30 DIAGNOSIS — Z01812 Encounter for preprocedural laboratory examination: Secondary | ICD-10-CM | POA: Insufficient documentation

## 2019-11-30 DIAGNOSIS — Z20822 Contact with and (suspected) exposure to covid-19: Secondary | ICD-10-CM | POA: Insufficient documentation

## 2019-11-30 DIAGNOSIS — J90 Pleural effusion, not elsewhere classified: Secondary | ICD-10-CM | POA: Diagnosis not present

## 2019-11-30 HISTORY — DX: Myasthenia gravis without (acute) exacerbation: G70.00

## 2019-11-30 HISTORY — DX: Other complications of anesthesia, initial encounter: T88.59XA

## 2019-11-30 LAB — CBC WITH DIFFERENTIAL/PLATELET
Abs Immature Granulocytes: 0.4 10*3/uL — ABNORMAL HIGH (ref 0.00–0.07)
Basophils Absolute: 0.1 10*3/uL (ref 0.0–0.1)
Basophils Relative: 0 %
Eosinophils Absolute: 0 10*3/uL (ref 0.0–0.5)
Eosinophils Relative: 0 %
HCT: 49.3 % — ABNORMAL HIGH (ref 36.0–46.0)
Hemoglobin: 15.5 g/dL — ABNORMAL HIGH (ref 12.0–15.0)
Immature Granulocytes: 2 %
Lymphocytes Relative: 9 %
Lymphs Abs: 2.2 10*3/uL (ref 0.7–4.0)
MCH: 29.8 pg (ref 26.0–34.0)
MCHC: 31.4 g/dL (ref 30.0–36.0)
MCV: 94.8 fL (ref 80.0–100.0)
Monocytes Absolute: 1 10*3/uL (ref 0.1–1.0)
Monocytes Relative: 4 %
Neutro Abs: 20.1 10*3/uL — ABNORMAL HIGH (ref 1.7–7.7)
Neutrophils Relative %: 85 %
Platelets: 362 10*3/uL (ref 150–400)
RBC: 5.2 MIL/uL — ABNORMAL HIGH (ref 3.87–5.11)
RDW: 14.3 % (ref 11.5–15.5)
WBC: 23.8 10*3/uL — ABNORMAL HIGH (ref 4.0–10.5)
nRBC: 0 % (ref 0.0–0.2)

## 2019-11-30 LAB — BASIC METABOLIC PANEL
Anion gap: 15 (ref 5–15)
BUN: 25 mg/dL — ABNORMAL HIGH (ref 8–23)
CO2: 22 mmol/L (ref 22–32)
Calcium: 9.6 mg/dL (ref 8.9–10.3)
Chloride: 102 mmol/L (ref 98–111)
Creatinine, Ser: 1.13 mg/dL — ABNORMAL HIGH (ref 0.44–1.00)
GFR calc Af Amer: 54 mL/min — ABNORMAL LOW (ref >60)
GFR calc non Af Amer: 47 mL/min — ABNORMAL LOW (ref >60)
Glucose, Bld: 112 mg/dL — ABNORMAL HIGH (ref 70–99)
Potassium: 3.7 mmol/L (ref 3.5–5.1)
Sodium: 139 mmol/L (ref 135–145)

## 2019-11-30 LAB — TYPE AND SCREEN
ABO/RH(D): O POS
Antibody Screen: NEGATIVE

## 2019-11-30 LAB — SURGICAL PCR SCREEN
MRSA, PCR: NEGATIVE
Staphylococcus aureus: POSITIVE — AB

## 2019-11-30 LAB — PROTIME-INR
INR: 0.9 (ref 0.8–1.2)
Prothrombin Time: 12.1 seconds (ref 11.4–15.2)

## 2019-11-30 LAB — SARS CORONAVIRUS 2 (TAT 6-24 HRS): SARS Coronavirus 2: NEGATIVE

## 2019-11-30 IMAGING — CR DG CHEST 2V
2 series · 2 of 2 positions shown · non-contrast
Comparison: [DATE].

CLINICAL DATA: Pre-admission for back surgery.

EXAM:
CHEST - 2 VIEW

[w chest pa]
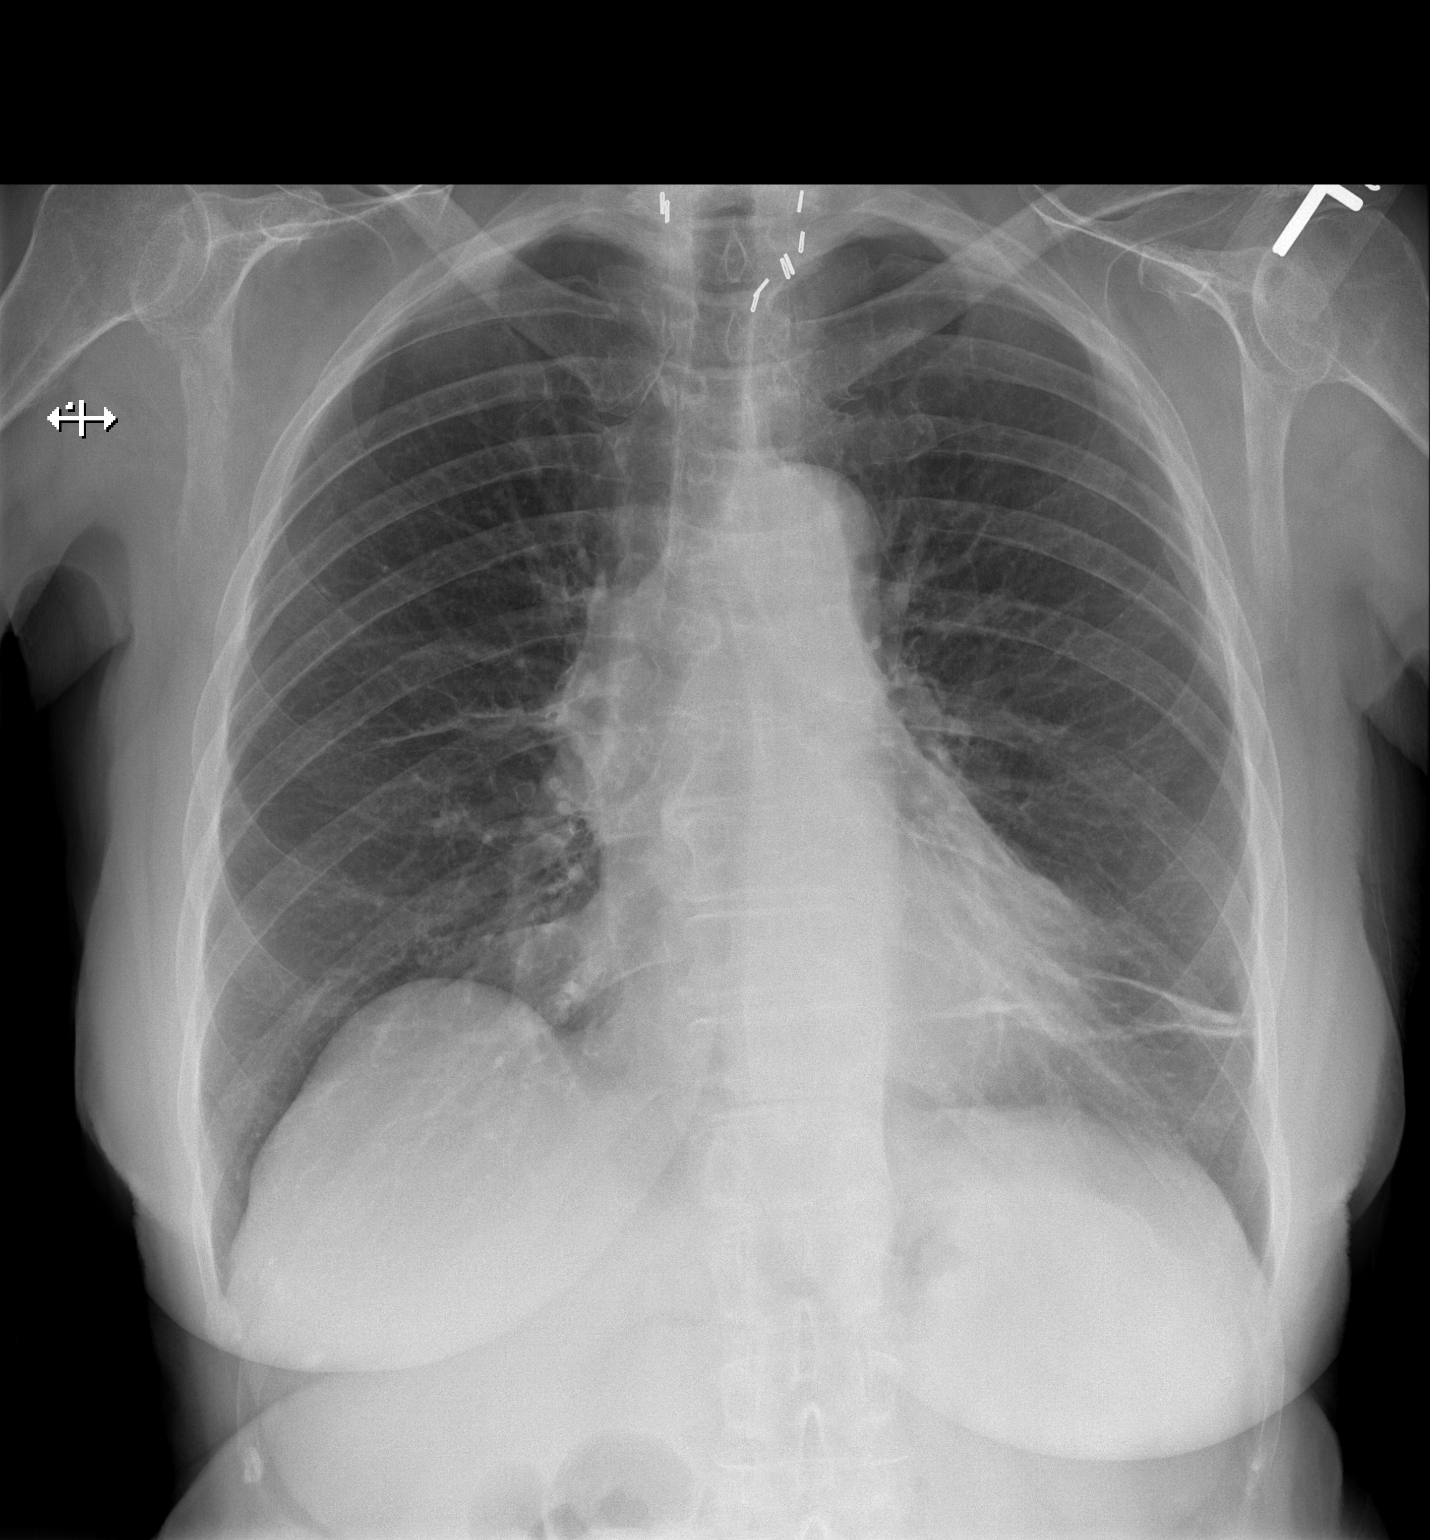

[w chest lat]
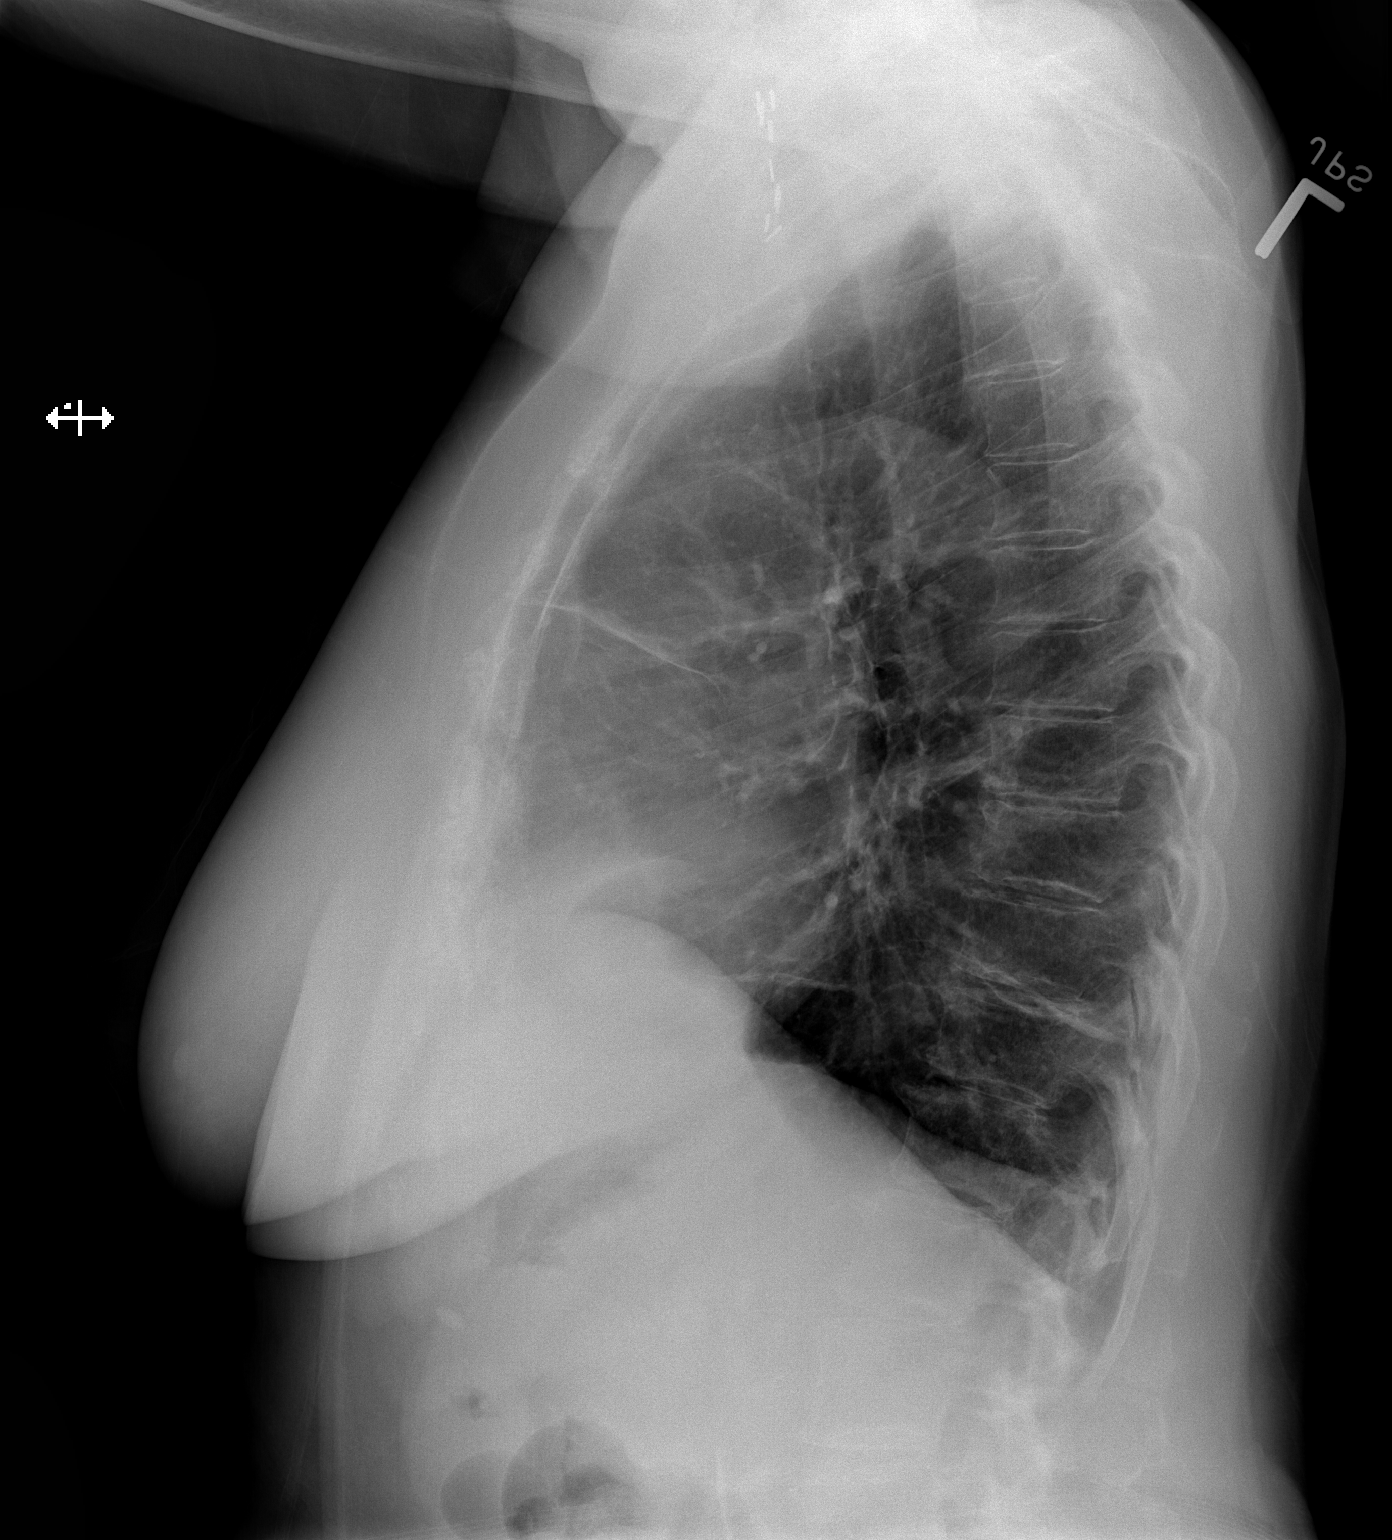

[2 of 2 positions shown; findings below may reference images not displayed]

FINDINGS: The heart size and mediastinal contours are within normal limits. No
pneumothorax or pleural effusion is noted. Right lung is clear. Mild
left basilar subsegmental atelectasis is noted. The visualized
skeletal structures are unremarkable.
IMPRESSION: Mild left basilar subsegmental atelectasis.

## 2019-11-30 NOTE — Progress Notes (Signed)
PCP - Carol Ada Cardiologist - denies   Chest x-ray - n/a EKG - 06/23/19 Stress Test - denies ECHO - denies Cardiac Cath - denies   Blood Thinner Instructions:denies Aspirin Instructions: LD 11/23/19 GAMMAPLEX inj: 8/32/21 this afternoon   COVID TEST- 11/30/19    Anesthesia review: yes, md order, myasthenia gravis  Patient denies shortness of breath, fever, cough and chest pain at PAT appointment   All instructions explained to the patient, with a verbal understanding of the material. Patient agrees to go over the instructions while at home for a better understanding. Patient also instructed to self quarantine after being tested for COVID-19. The opportunity to ask questions was provided.

## 2019-11-30 NOTE — Telephone Encounter (Signed)
Patient's contact called wanting to know when to schedule the next infusion. She is having one today.

## 2019-12-01 NOTE — Telephone Encounter (Signed)
Next IVIG infusion in 6 weeks.  I will also notify [employer]. Thanks.

## 2019-12-01 NOTE — Anesthesia Preprocedure Evaluation (Deleted)
Anesthesia Evaluation    Airway        Dental   Pulmonary former smoker,           Cardiovascular hypertension,      Neuro/Psych    GI/Hepatic   Endo/Other    Renal/GU      Musculoskeletal   Abdominal   Peds  Hematology   Anesthesia Other Findings   Reproductive/Obstetrics                             Anesthesia Physical Anesthesia Plan  ASA:   Anesthesia Plan:    Post-op Pain Management:    Induction:   PONV Risk Score and Plan:   Airway Management Planned:   Additional Equipment:   Intra-op Plan:   Post-operative Plan:   Informed Consent:   Plan Discussed with:   Anesthesia Plan Comments: (PAT note by Karoline Caldwell, PA-C: Follows with neurology for history of 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  Last seen by Dr. Posey Pronto 11/09/2019 and per note she has been doing great with respect to myasthenia without any spells of weakness.  She is on IVIG every 3 weeks, prednisone 20 mg, Mestinon 60 mg 3 times daily.  Discussed upcoming surgery, and per note "I advised her to undergo surgery given the potential risk of further neurological compromise.  Regarding anesthesia, avoid the use of neuromuscular blocking agents."  Recently underwent ORIF of right ankle 5/0/3546 without complication.  Last seen by PCP Dr. Carol Ada 09/16/2019, discussed severe spinal stenosis and patient was referred for neurosurgery evaluation.  Preop labs reviewed, WBC elevated to 23.8.  Remainder of labs unremarkable.  Review of previous labs shows elevated WBC during admission March 2021 at time of myasthenia gravis diagnosis. Results called to Dr. Ronnald Ramp' office.  EKG 06/22/2019: Sinus tachycardia.  Rate 107. 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  Does not appear significantly changed from tracing 11/13/2018 done at PCP office, copy on chart. )        Anesthesia Quick  Evaluation

## 2019-12-01 NOTE — Progress Notes (Signed)
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  Last seen by Dr. Posey Pronto 11/09/2019 and per note she has been doing great with respect to myasthenia without any spells of weakness.  She is on IVIG every 3 weeks, prednisone 20 mg, Mestinon 60 mg 3 times daily.  Discussed upcoming surgery, and per note "I advised her to undergo surgery given the potential risk of further neurological compromise.  Regarding anesthesia, avoid the use of neuromuscular blocking agents."  Recently underwent ORIF of right ankle 11/08/599 without complication.  Last seen by PCP Dr. Carol Ada 09/16/2019, discussed severe spinal stenosis and patient was referred for neurosurgery evaluation.  Preop labs reviewed, WBC elevated to 23.8.  Remainder of labs unremarkable.  Review of previous labs shows previous elevated WBC during admission March 2021 at time of myasthenia gravis diagnosis. Results called to Dr. Ronnald Ramp' office.  EKG 06/22/2019: Sinus tachycardia.  Rate 107. 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  Does not appear significantly changed from tracing 11/13/2018 done at PCP office, copy on chart.   Wynonia Musty [facility]/Anesthesiology Phone [phone] 12/01/2019 12:10 PM

## 2019-12-01 NOTE — Telephone Encounter (Signed)
Patients son has been notified and voiced understanding.

## 2019-12-01 NOTE — Anesthesia Preprocedure Evaluation (Addendum)
Anesthesia Evaluation  Patient identified by MRN, date of birth, ID band Patient awake    Reviewed: Allergy & Precautions, NPO status , Patient's Chart, lab work & pertinent test results  History of Anesthesia Complications (+) PONV and history of anesthetic complications  Airway Mallampati: I       Dental  (+) Edentulous Upper, Edentulous Lower   Pulmonary neg pulmonary ROS, former smoker,    Pulmonary exam normal        Cardiovascular hypertension, Pt. 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  Last seen by Dr. Posey Pronto 11/09/2019 and per note she has been doing great with respect to myasthenia without any spells of weakness.  She is on IVIG every 3 weeks, prednisone 20 mg, Mestinon 60 mg 3 times daily.  Discussed upcoming surgery, and per note "I advised her to undergo surgery given the potential risk of further neurological compromise.  Regarding anesthesia, avoid the use of neuromuscular blocking agents."  negative neurological ROS  negative psych ROS   GI/Hepatic negative GI ROS, Neg liver ROS,   Endo/Other  Hypothyroidism   Renal/GU Renal InsufficiencyRenal disease     Musculoskeletal negative musculoskeletal ROS (+)   Abdominal   Peds  Hematology negative hematology ROS (+)   Anesthesia Other Findings - HLD  Reproductive/Obstetrics                            Anesthesia Physical  Anesthesia Plan  ASA: III  Anesthesia Plan: General   Post-op Pain Management:    Induction: Intravenous  PONV Risk Score and Plan: 4 or greater and Ondansetron, Treatment may vary due to age or medical condition, Propofol infusion and Dexamethasone  Airway Management Planned: Oral ETT  Additional Equipment: None  Intra-op Plan:   Post-operative Plan: Extubation in  OR  Informed Consent: I have reviewed the patients History and Physical, chart, labs and discussed the procedure including the risks, benefits and alternatives for the proposed anesthesia with the patient or authorized representative who has indicated his/her understanding and acceptance.     Dental advisory given  Plan Discussed with: Anesthesiologist, CRNA and Surgeon  Anesthesia Plan Comments:        Anesthesia Quick Evaluation

## 2019-12-02 ENCOUNTER — Inpatient Hospital Stay (HOSPITAL_COMMUNITY): Payer: Medicare Other | Admitting: Physician Assistant

## 2019-12-02 ENCOUNTER — Ambulatory Visit (HOSPITAL_COMMUNITY)
Admission: RE | Admit: 2019-12-02 | Discharge: 2019-12-03 | Disposition: A | Discharge status: Home Health | Payer: Medicare Other | Attending: Neurological Surgery | Admitting: Neurological Surgery

## 2019-12-02 ENCOUNTER — Encounter (HOSPITAL_COMMUNITY): Payer: Self-pay | Admitting: Neurological Surgery

## 2019-12-02 ENCOUNTER — Other Ambulatory Visit: Payer: Self-pay

## 2019-12-02 ENCOUNTER — Inpatient Hospital Stay (HOSPITAL_COMMUNITY): Payer: Medicare Other

## 2019-12-02 ENCOUNTER — Encounter (HOSPITAL_COMMUNITY)
Admission: RE | Disposition: A | Discharge status: Home Health | Payer: Self-pay | Source: Home / Self Care | Attending: Neurological Surgery

## 2019-12-02 DIAGNOSIS — M5116 Intervertebral disc disorders with radiculopathy, lumbar region: Secondary | ICD-10-CM | POA: Diagnosis not present

## 2019-12-02 DIAGNOSIS — M48061 Spinal stenosis, lumbar region without neurogenic claudication: Secondary | ICD-10-CM | POA: Diagnosis not present

## 2019-12-02 DIAGNOSIS — Z7982 Long term (current) use of aspirin: Secondary | ICD-10-CM | POA: Diagnosis not present

## 2019-12-02 DIAGNOSIS — R531 Weakness: Secondary | ICD-10-CM | POA: Diagnosis not present

## 2019-12-02 DIAGNOSIS — M5137 Other intervertebral disc degeneration, lumbosacral region: Secondary | ICD-10-CM | POA: Diagnosis not present

## 2019-12-02 DIAGNOSIS — I129 Hypertensive chronic kidney disease with stage 1 through stage 4 chronic kidney disease, or unspecified chronic kidney disease: Secondary | ICD-10-CM | POA: Diagnosis not present

## 2019-12-02 DIAGNOSIS — E7800 Pure hypercholesterolemia, unspecified: Secondary | ICD-10-CM | POA: Insufficient documentation

## 2019-12-02 DIAGNOSIS — Z419 Encounter for procedure for purposes other than remedying health state, unspecified: Secondary | ICD-10-CM

## 2019-12-02 DIAGNOSIS — E039 Hypothyroidism, unspecified: Secondary | ICD-10-CM | POA: Diagnosis not present

## 2019-12-02 DIAGNOSIS — N189 Chronic kidney disease, unspecified: Secondary | ICD-10-CM | POA: Insufficient documentation

## 2019-12-02 DIAGNOSIS — G7000 Myasthenia gravis without (acute) exacerbation: Secondary | ICD-10-CM | POA: Diagnosis not present

## 2019-12-02 DIAGNOSIS — Z981 Arthrodesis status: Secondary | ICD-10-CM

## 2019-12-02 DIAGNOSIS — M5136 Other intervertebral disc degeneration, lumbar region: Secondary | ICD-10-CM | POA: Diagnosis not present

## 2019-12-02 DIAGNOSIS — Z79899 Other long term (current) drug therapy: Secondary | ICD-10-CM | POA: Diagnosis not present

## 2019-12-02 DIAGNOSIS — M4316 Spondylolisthesis, lumbar region: Secondary | ICD-10-CM | POA: Insufficient documentation

## 2019-12-02 DIAGNOSIS — I10 Essential (primary) hypertension: Secondary | ICD-10-CM | POA: Diagnosis not present

## 2019-12-02 DIAGNOSIS — M5126 Other intervertebral disc displacement, lumbar region: Secondary | ICD-10-CM | POA: Diagnosis not present

## 2019-12-02 DIAGNOSIS — Z7952 Long term (current) use of systemic steroids: Secondary | ICD-10-CM | POA: Insufficient documentation

## 2019-12-02 DIAGNOSIS — Z87891 Personal history of nicotine dependence: Secondary | ICD-10-CM | POA: Insufficient documentation

## 2019-12-02 DIAGNOSIS — E785 Hyperlipidemia, unspecified: Secondary | ICD-10-CM | POA: Insufficient documentation

## 2019-12-02 DIAGNOSIS — N183 Chronic kidney disease, stage 3 unspecified: Secondary | ICD-10-CM | POA: Diagnosis not present

## 2019-12-02 LAB — URINALYSIS, COMPLETE (UACMP) WITH MICROSCOPIC
Bacteria, UA: NONE SEEN
Bilirubin Urine: NEGATIVE
Glucose, UA: NEGATIVE mg/dL
Hgb urine dipstick: NEGATIVE
Ketones, ur: NEGATIVE mg/dL
Leukocytes,Ua: NEGATIVE
Nitrite: NEGATIVE
Protein, ur: NEGATIVE mg/dL
Specific Gravity, Urine: 1.016 (ref 1.005–1.030)
pH: 7 (ref 5.0–8.0)

## 2019-12-02 LAB — ABO/RH: ABO/RH(D): O POS

## 2019-12-02 LAB — CBC
HCT: 45.2 % (ref 36.0–46.0)
Hemoglobin: 14.7 g/dL (ref 12.0–15.0)
MCH: 30.4 pg (ref 26.0–34.0)
MCHC: 32.5 g/dL (ref 30.0–36.0)
MCV: 93.4 fL (ref 80.0–100.0)
Platelets: 275 10*3/uL (ref 150–400)
RBC: 4.84 MIL/uL (ref 3.87–5.11)
RDW: 14.1 % (ref 11.5–15.5)
WBC: 14.3 10*3/uL — ABNORMAL HIGH (ref 4.0–10.5)
nRBC: 0 % (ref 0.0–0.2)

## 2019-12-02 IMAGING — RF DG C-ARM 1-60 MIN
1 series · 2 of 2 positions shown · non-contrast
Comparison: Lumbar radiographs [DATE]

CLINICAL DATA: Status post posterior fusion

EXAM:
DG C-ARM 1-60 MIN; LUMBAR SPINE - 2-3 VIEW
FLUOROSCOPY TIME:  Fluoroscopy Time:  0 minutes 58 seconds
Radiation Exposure Index (if provided by the fluoroscopic device):
32.48 mGy
Number of Acquired Spot Images: 2

[Series 1: run · 2 of 2 slices shown]
[im 1/2]
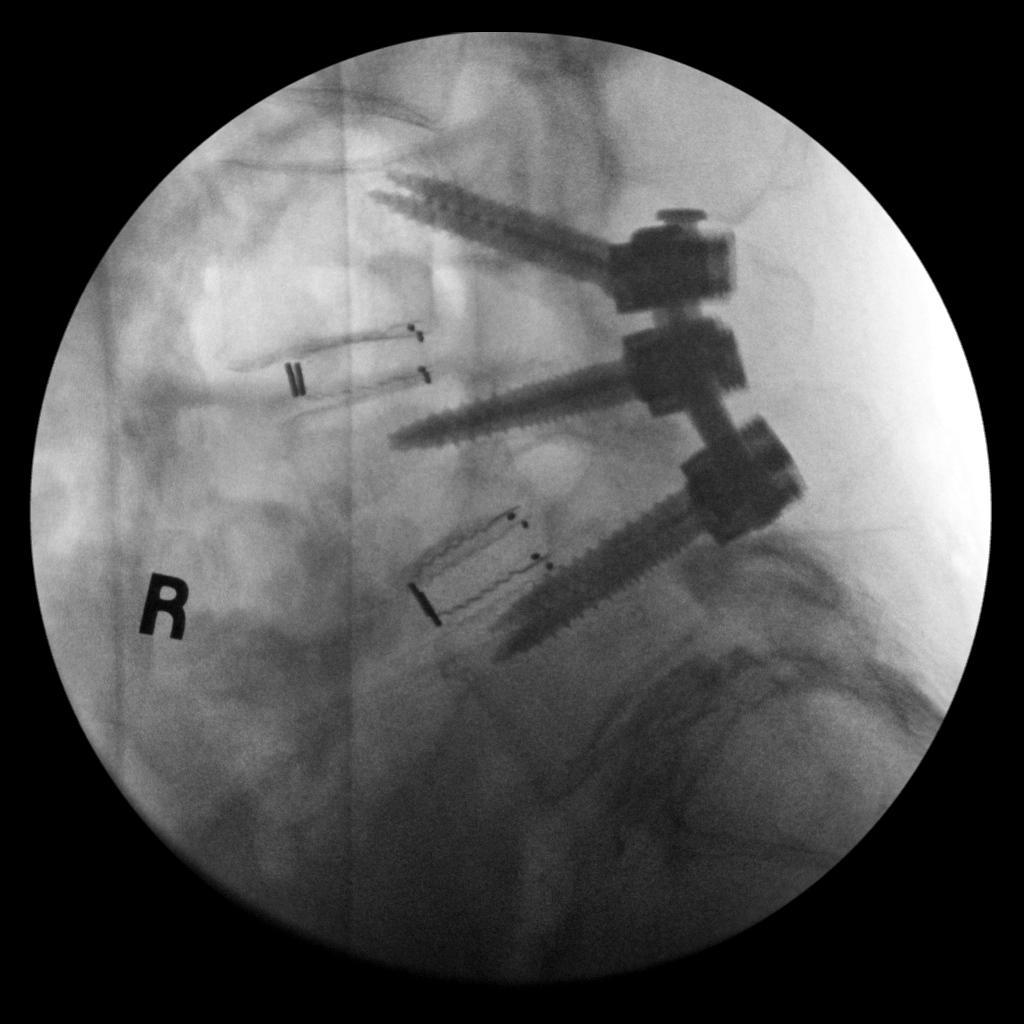
[im 2/2]
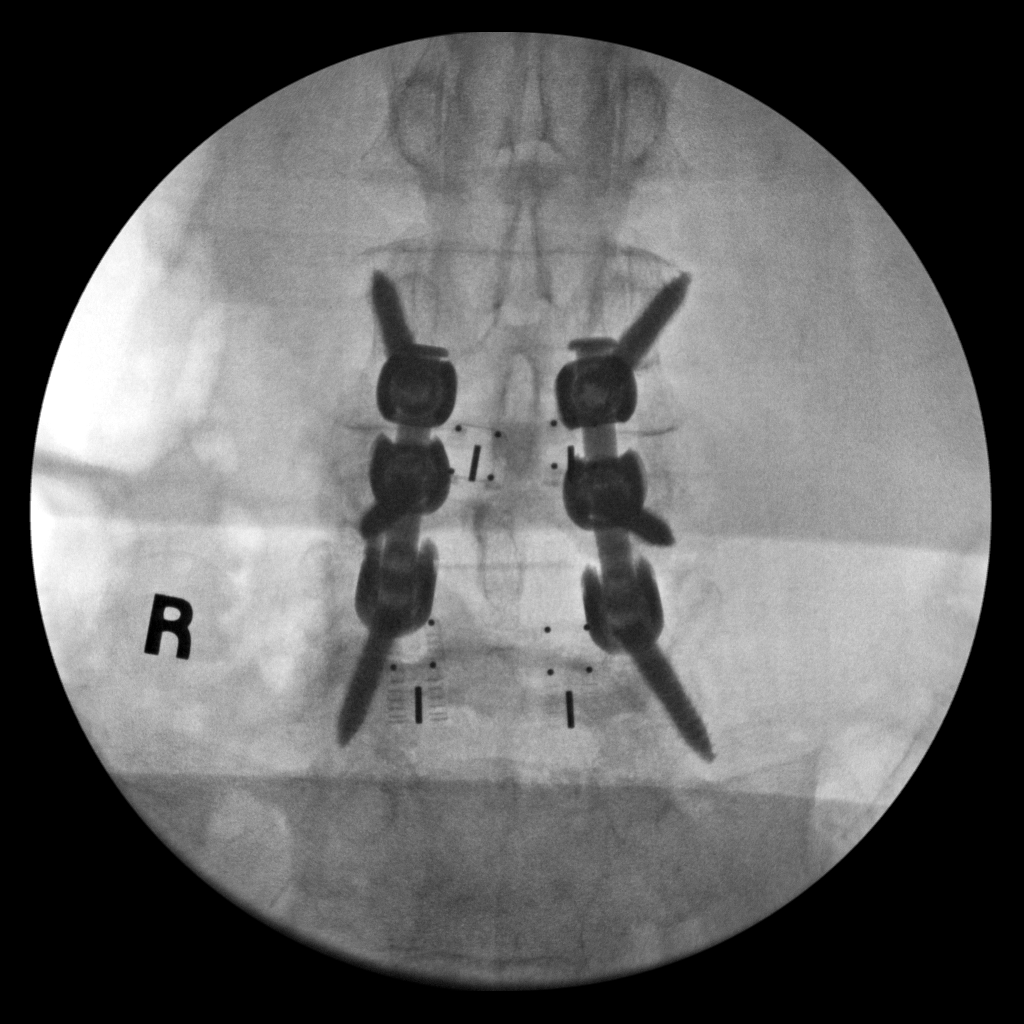

[2 of 2 positions shown; findings below may reference images not displayed]

FINDINGS: Frontal and lateral views were obtained. There is posterior screw
and plate fixation at L3, L4, and L5. There are disc spacers at L3-4
and L4-5. Support hardware appears intact. Pedicle screws are in the
respective vertebral bodies. No fracture evident. There is
persistent anterolisthesis at L3-4 and L4-5, more severe at L4-5,
stable. There is moderate disc space narrowing at L5-S1.
IMPRESSION: Postoperative screw and plate fixation from L3-L5 with support
hardware intact. Disc spacers at L3-4 and L4-5. Stable
spondylolisthesis at L3-4 and L4-5. No fracture. Stable disc space
narrowing at L5-S1.

## 2019-12-02 IMAGING — RF DG LUMBAR SPINE 2-3V
1 series · 2 of 2 positions shown · non-contrast
Comparison: Lumbar radiographs [DATE]

CLINICAL DATA: Status post posterior fusion

EXAM:
DG C-ARM 1-60 MIN; LUMBAR SPINE - 2-3 VIEW
FLUOROSCOPY TIME:  Fluoroscopy Time:  0 minutes 58 seconds
Radiation Exposure Index (if provided by the fluoroscopic device):
32.48 mGy
Number of Acquired Spot Images: 2

[Series 1: run · 2 of 2 slices shown]
[im 1/2]
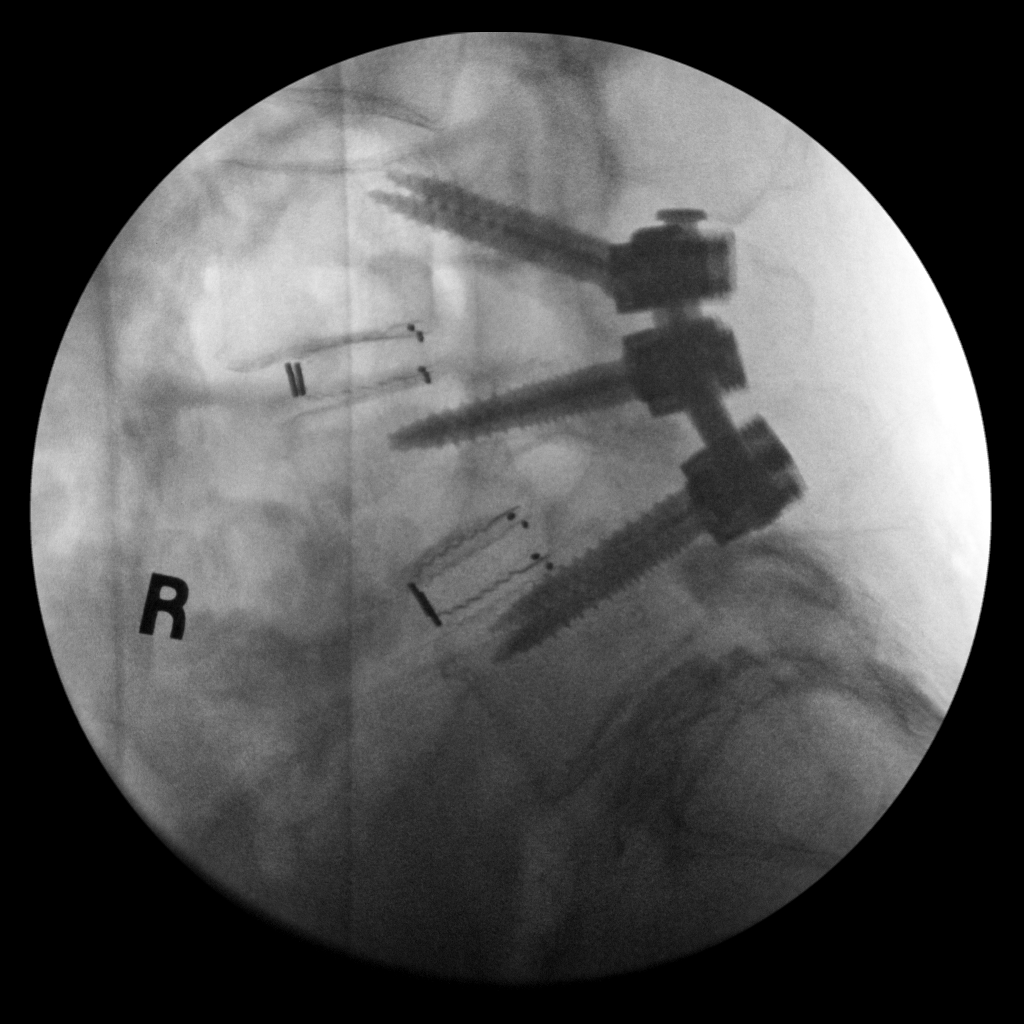
[im 2/2]
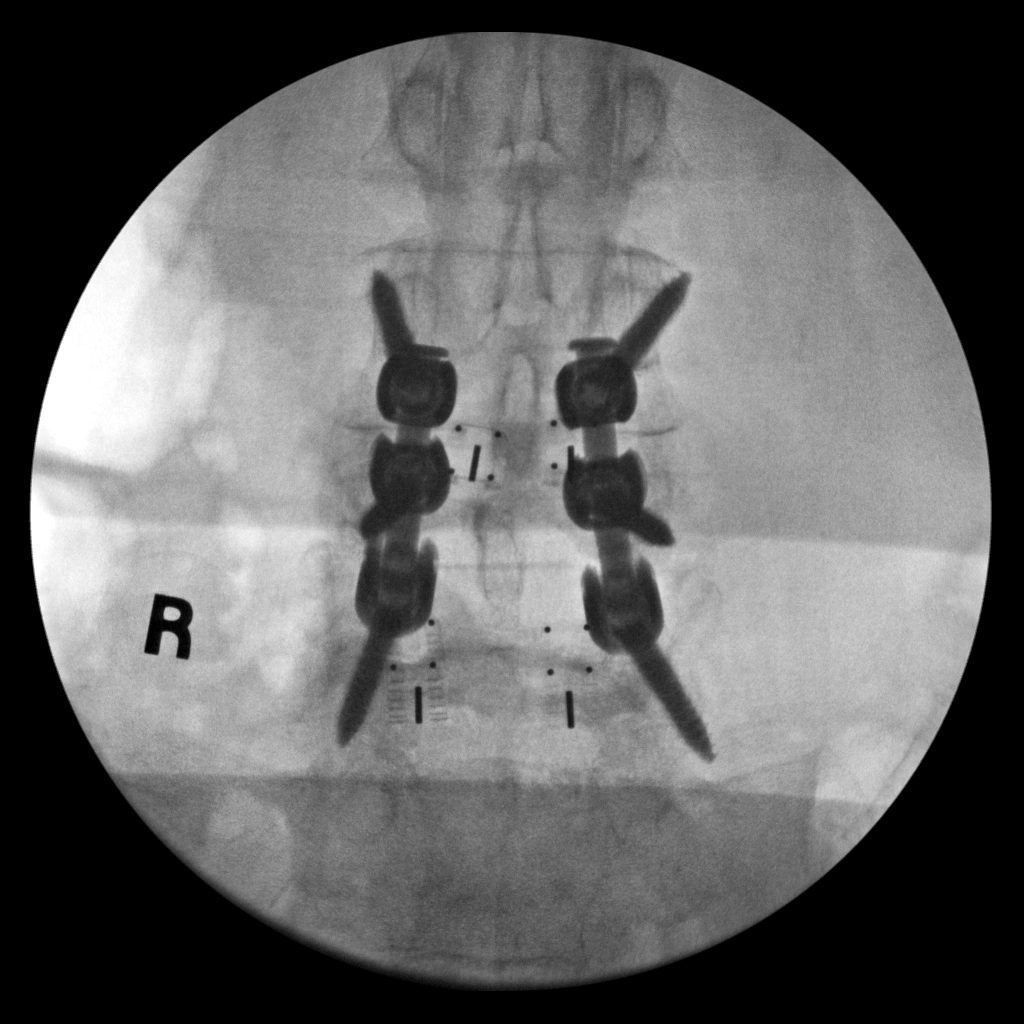

[2 of 2 positions shown; findings below may reference images not displayed]

FINDINGS: Frontal and lateral views were obtained. There is posterior screw
and plate fixation at L3, L4, and L5. There are disc spacers at L3-4
and L4-5. Support hardware appears intact. Pedicle screws are in the
respective vertebral bodies. No fracture evident. There is
persistent anterolisthesis at L3-4 and L4-5, more severe at L4-5,
stable. There is moderate disc space narrowing at L5-S1.
IMPRESSION: Postoperative screw and plate fixation from L3-L5 with support
hardware intact. Disc spacers at L3-4 and L4-5. Stable
spondylolisthesis at L3-4 and L4-5. No fracture. Stable disc space
narrowing at L5-S1.

## 2019-12-02 SURGERY — POSTERIOR LUMBAR FUSION 2 LEVEL
Anesthesia: General | Site: Back

## 2019-12-02 MED ORDER — ONDANSETRON HCL 4 MG/2ML IJ SOLN: 4.0000 mg | Freq: Four times a day (QID) | INTRAMUSCULAR | Status: DC | PRN

## 2019-12-02 MED ORDER — FENTANYL CITRATE (PF) 100 MCG/2ML IJ SOLN
INTRAMUSCULAR | Status: DC | PRN
Start: 2019-12-02 — End: 2019-12-02
  Administered 2019-12-02 (×5): 50 ug via INTRAVENOUS

## 2019-12-02 MED ORDER — METHOCARBAMOL 1000 MG/10ML IJ SOLN
500.0000 mg | Freq: Four times a day (QID) | INTRAVENOUS | Status: DC | PRN
  Filled 2019-12-02: qty 5

## 2019-12-02 MED ORDER — DEXAMETHASONE SODIUM PHOSPHATE 10 MG/ML IJ SOLN
10.0000 mg | Freq: Once | INTRAMUSCULAR | Status: AC
  Administered 2019-12-02: 10 mg via INTRAVENOUS
  Filled 2019-12-02: qty 1

## 2019-12-02 MED ORDER — KETAMINE HCL 10 MG/ML IJ SOLN
INTRAMUSCULAR | Status: DC | PRN
  Administered 2019-12-02: 10 mg via INTRAVENOUS
  Administered 2019-12-02: 20 mg via INTRAVENOUS

## 2019-12-02 MED ORDER — CEFAZOLIN SODIUM-DEXTROSE 2-4 GM/100ML-% IV SOLN
2.0000 g | INTRAVENOUS | Status: DC
  Filled 2019-12-02: qty 100

## 2019-12-02 MED ORDER — POTASSIUM CHLORIDE IN NACL 20-0.9 MEQ/L-% IV SOLN: INTRAVENOUS | Status: DC

## 2019-12-02 MED ORDER — LACTATED RINGERS IV SOLN: INTRAVENOUS | Status: DC

## 2019-12-02 MED ORDER — BUPIVACAINE HCL (PF) 0.25 % IJ SOLN
INTRAMUSCULAR | Status: AC
  Filled 2019-12-02: qty 30

## 2019-12-02 MED ORDER — DEXAMETHASONE SODIUM PHOSPHATE 4 MG/ML IJ SOLN: 4.0000 mg | Freq: Four times a day (QID) | INTRAMUSCULAR | Status: DC

## 2019-12-02 MED ORDER — LIDOCAINE 2% (20 MG/ML) 5 ML SYRINGE
INTRAMUSCULAR | Status: DC | PRN
  Administered 2019-12-02: 100 mg via INTRAVENOUS

## 2019-12-02 MED ORDER — ACETAMINOPHEN 325 MG PO TABS
650.0000 mg | ORAL_TABLET | ORAL | Status: DC | PRN
  Administered 2019-12-02: 650 mg via ORAL
  Filled 2019-12-02 (×2): qty 2

## 2019-12-02 MED ORDER — SODIUM CHLORIDE 0.9% FLUSH
3.0000 mL | Freq: Two times a day (BID) | INTRAVENOUS | Status: DC
  Administered 2019-12-02: 3 mL via INTRAVENOUS

## 2019-12-02 MED ORDER — ONDANSETRON HCL 4 MG/2ML IJ SOLN
INTRAMUSCULAR | Status: DC | PRN
  Administered 2019-12-02 (×2): 4 mg via INTRAVENOUS

## 2019-12-02 MED ORDER — THROMBIN 20000 UNITS EX SOLR
CUTANEOUS | Status: AC
  Filled 2019-12-02: qty 20000

## 2019-12-02 MED ORDER — SENNA 8.6 MG PO TABS
1.0000 | ORAL_TABLET | Freq: Two times a day (BID) | ORAL | Status: DC
  Administered 2019-12-02 – 2019-12-03 (×3): 8.6 mg via ORAL
  Filled 2019-12-02 (×3): qty 1

## 2019-12-02 MED ORDER — PREDNISONE 20 MG PO TABS: 20.0000 mg | ORAL_TABLET | Freq: Every day | ORAL | Status: DC

## 2019-12-02 MED ORDER — ARTHREX ANGEL - ACD-A SOLUTION (CHARTING ONLY) OPTIME
TOPICAL | Status: DC | PRN
  Administered 2019-12-02: 12 mL via TOPICAL

## 2019-12-02 MED ORDER — BUPIVACAINE HCL (PF) 0.25 % IJ SOLN
INTRAMUSCULAR | Status: DC | PRN
  Administered 2019-12-02: 3 mL

## 2019-12-02 MED ORDER — CHLORHEXIDINE GLUCONATE CLOTH 2 % EX PADS: 6.0000 | MEDICATED_PAD | Freq: Once | CUTANEOUS | Status: DC

## 2019-12-02 MED ORDER — SODIUM CHLORIDE 0.9 % IV SOLN: 250.0000 mL | INTRAVENOUS | Status: DC

## 2019-12-02 MED ORDER — CEFAZOLIN SODIUM-DEXTROSE 2-4 GM/100ML-% IV SOLN
2.0000 g | INTRAVENOUS | Status: AC
  Administered 2019-12-02: 2 g via INTRAVENOUS

## 2019-12-02 MED ORDER — EPHEDRINE 5 MG/ML INJ
INTRAVENOUS | Status: AC
  Filled 2019-12-02: qty 10

## 2019-12-02 MED ORDER — CELECOXIB 200 MG PO CAPS
200.0000 mg | ORAL_CAPSULE | Freq: Two times a day (BID) | ORAL | Status: DC
  Administered 2019-12-02 – 2019-12-03 (×3): 200 mg via ORAL
  Filled 2019-12-02 (×3): qty 1

## 2019-12-02 MED ORDER — ACETAMINOPHEN 500 MG PO TABS
1000.0000 mg | ORAL_TABLET | Freq: Once | ORAL | Status: AC
  Administered 2019-12-02: 1000 mg via ORAL
  Filled 2019-12-02: qty 2

## 2019-12-02 MED ORDER — FUROSEMIDE 20 MG PO TABS
10.0000 mg | ORAL_TABLET | ORAL | Status: DC
  Administered 2019-12-02: 10 mg via ORAL
  Filled 2019-12-02: qty 1

## 2019-12-02 MED ORDER — FENTANYL CITRATE (PF) 100 MCG/2ML IJ SOLN
25.0000 ug | INTRAMUSCULAR | Status: DC | PRN
  Administered 2019-12-02: 25 ug via INTRAVENOUS

## 2019-12-02 MED ORDER — ACETAMINOPHEN 650 MG RE SUPP: 650.0000 mg | RECTAL | Status: DC | PRN

## 2019-12-02 MED ORDER — AMLODIPINE BESYLATE 5 MG PO TABS
10.0000 mg | ORAL_TABLET | Freq: Every day | ORAL | Status: DC
  Administered 2019-12-03: 10 mg via ORAL
  Filled 2019-12-02: qty 2

## 2019-12-02 MED ORDER — PYRIDOSTIGMINE BROMIDE 60 MG PO TABS
60.0000 mg | ORAL_TABLET | ORAL | Status: DC
  Administered 2019-12-02 – 2019-12-03 (×3): 60 mg via ORAL
  Filled 2019-12-02 (×5): qty 1

## 2019-12-02 MED ORDER — PANTOPRAZOLE SODIUM 40 MG PO TBEC
40.0000 mg | DELAYED_RELEASE_TABLET | Freq: Every day | ORAL | Status: DC
  Administered 2019-12-03: 40 mg via ORAL
  Filled 2019-12-02 (×2): qty 1

## 2019-12-02 MED ORDER — CEFAZOLIN SODIUM-DEXTROSE 2-4 GM/100ML-% IV SOLN
2.0000 g | Freq: Three times a day (TID) | INTRAVENOUS | Status: AC
  Administered 2019-12-02 (×2): 2 g via INTRAVENOUS
  Filled 2019-12-02 (×2): qty 100

## 2019-12-02 MED ORDER — DEXAMETHASONE SODIUM PHOSPHATE 10 MG/ML IJ SOLN
INTRAMUSCULAR | Status: AC
  Filled 2019-12-02: qty 1

## 2019-12-02 MED ORDER — ONDANSETRON HCL 4 MG/2ML IJ SOLN
INTRAMUSCULAR | Status: AC
  Filled 2019-12-02: qty 2

## 2019-12-02 MED ORDER — PROPOFOL 10 MG/ML IV BOLUS
INTRAVENOUS | Status: DC | PRN
  Administered 2019-12-02: 100 mg via INTRAVENOUS
  Administered 2019-12-02: 70 mg via INTRAVENOUS
  Administered 2019-12-02: 30 mg via INTRAVENOUS

## 2019-12-02 MED ORDER — DEXAMETHASONE 4 MG PO TABS
4.0000 mg | ORAL_TABLET | Freq: Four times a day (QID) | ORAL | Status: DC
  Administered 2019-12-02 – 2019-12-03 (×2): 4 mg via ORAL
  Filled 2019-12-02 (×3): qty 1

## 2019-12-02 MED ORDER — ONDANSETRON HCL 4 MG PO TABS: 4.0000 mg | ORAL_TABLET | Freq: Four times a day (QID) | ORAL | Status: DC | PRN

## 2019-12-02 MED ORDER — CHLORHEXIDINE GLUCONATE 0.12 % MT SOLN
15.0000 mL | Freq: Once | OROMUCOSAL | Status: AC
  Administered 2019-12-02: 15 mL via OROMUCOSAL
  Filled 2019-12-02: qty 15

## 2019-12-02 MED ORDER — MENTHOL 3 MG MT LOZG: 1.0000 | LOZENGE | OROMUCOSAL | Status: DC | PRN

## 2019-12-02 MED ORDER — HEPARIN SODIUM (PORCINE) 1000 UNIT/ML IJ SOLN
INTRAMUSCULAR | Status: DC | PRN
  Administered 2019-12-02: 5000 [IU]

## 2019-12-02 MED ORDER — PHENYLEPHRINE HCL-NACL 10-0.9 MG/250ML-% IV SOLN
INTRAVENOUS | Status: DC | PRN
  Administered 2019-12-02: 20 ug/min via INTRAVENOUS

## 2019-12-02 MED ORDER — OXYCODONE HCL 5 MG PO TABS
5.0000 mg | ORAL_TABLET | ORAL | Status: DC | PRN
  Administered 2019-12-02: 5 mg via ORAL
  Filled 2019-12-02: qty 1

## 2019-12-02 MED ORDER — FENTANYL CITRATE (PF) 100 MCG/2ML IJ SOLN
INTRAMUSCULAR | Status: AC
  Filled 2019-12-02: qty 2

## 2019-12-02 MED ORDER — THROMBIN 5000 UNITS EX SOLR
CUTANEOUS | Status: AC
  Filled 2019-12-02: qty 5000

## 2019-12-02 MED ORDER — METHOCARBAMOL 500 MG PO TABS: 500.0000 mg | ORAL_TABLET | Freq: Four times a day (QID) | ORAL | Status: DC | PRN

## 2019-12-02 MED ORDER — CELECOXIB 200 MG PO CAPS
200.0000 mg | ORAL_CAPSULE | Freq: Once | ORAL | Status: AC
  Administered 2019-12-02: 200 mg via ORAL
  Filled 2019-12-02: qty 1

## 2019-12-02 MED ORDER — PROMETHAZINE HCL 25 MG/ML IJ SOLN: 6.2500 mg | INTRAMUSCULAR | Status: DC | PRN

## 2019-12-02 MED ORDER — EPHEDRINE SULFATE-NACL 50-0.9 MG/10ML-% IV SOSY
PREFILLED_SYRINGE | INTRAVENOUS | Status: DC | PRN
  Administered 2019-12-02 (×5): 10 mg via INTRAVENOUS

## 2019-12-02 MED ORDER — LEVOTHYROXINE SODIUM 88 MCG PO TABS
88.0000 ug | ORAL_TABLET | Freq: Every day | ORAL | Status: DC
  Administered 2019-12-03: 88 ug via ORAL
  Filled 2019-12-02: qty 1

## 2019-12-02 MED ORDER — SODIUM CHLORIDE (PF) 0.9 % IJ SOLN
INTRAMUSCULAR | Status: DC | PRN
  Administered 2019-12-02: 5 mL

## 2019-12-02 MED ORDER — ROCURONIUM BROMIDE 10 MG/ML (PF) SYRINGE
PREFILLED_SYRINGE | INTRAVENOUS | Status: AC
  Filled 2019-12-02: qty 10

## 2019-12-02 MED ORDER — 0.9 % SODIUM CHLORIDE (POUR BTL) OPTIME
TOPICAL | Status: DC | PRN
  Administered 2019-12-02: 1000 mL

## 2019-12-02 MED ORDER — SODIUM CHLORIDE 0.9% FLUSH: 3.0000 mL | INTRAVENOUS | Status: DC | PRN

## 2019-12-02 MED ORDER — THROMBIN 20000 UNITS EX SOLR: CUTANEOUS | Status: DC | PRN

## 2019-12-02 MED ORDER — PHENOL 1.4 % MT LIQD: 1.0000 | OROMUCOSAL | Status: DC | PRN

## 2019-12-02 MED ORDER — ASPIRIN EC 81 MG PO TBEC
81.0000 mg | DELAYED_RELEASE_TABLET | ORAL | Status: DC
  Administered 2019-12-02: 81 mg via ORAL
  Filled 2019-12-02: qty 1

## 2019-12-02 MED ORDER — MORPHINE SULFATE (PF) 2 MG/ML IV SOLN: 2.0000 mg | INTRAVENOUS | Status: DC | PRN

## 2019-12-02 MED ORDER — KETAMINE HCL 50 MG/5ML IJ SOSY
PREFILLED_SYRINGE | INTRAMUSCULAR | Status: AC
  Filled 2019-12-02: qty 5

## 2019-12-02 MED ORDER — LIDOCAINE 2% (20 MG/ML) 5 ML SYRINGE
INTRAMUSCULAR | Status: AC
  Filled 2019-12-02: qty 5

## 2019-12-02 MED ORDER — THROMBIN 5000 UNITS EX SOLR: OROMUCOSAL | Status: DC | PRN

## 2019-12-02 MED ORDER — HEPARIN SODIUM (PORCINE) 1000 UNIT/ML IJ SOLN
INTRAMUSCULAR | Status: AC
  Filled 2019-12-02: qty 1

## 2019-12-02 MED ORDER — SODIUM CHLORIDE 0.9 % IV SOLN: INTRAVENOUS | Status: DC | PRN

## 2019-12-02 MED ORDER — ORAL CARE MOUTH RINSE: 15.0000 mL | Freq: Once | OROMUCOSAL | Status: AC

## 2019-12-02 MED ORDER — FENTANYL CITRATE (PF) 250 MCG/5ML IJ SOLN
INTRAMUSCULAR | Status: AC
  Filled 2019-12-02: qty 5

## 2019-12-02 SURGICAL SUPPLY — 67 items
BAG DECANTER FOR FLEXI CONT (MISCELLANEOUS) ×2 IMPLANT
BASKET BONE COLLECTION (BASKET) ×2 IMPLANT
BENZOIN TINCTURE PRP APPL 2/3 (GAUZE/BANDAGES/DRESSINGS) ×2 IMPLANT
BLADE CLIPPER SURG (BLADE) IMPLANT
BUR CARBIDE MATCH 3.0 (BURR) ×2 IMPLANT
CANISTER SUCT 3000ML PPV (MISCELLANEOUS) ×2 IMPLANT
CNTNR URN SCR LID CUP LEK RST (MISCELLANEOUS) ×1 IMPLANT
CONT SPEC 4OZ STRL OR WHT (MISCELLANEOUS) ×2
COVER BACK TABLE 60X90IN (DRAPES) ×2 IMPLANT
COVER WAND RF STERILE (DRAPES) IMPLANT
DERMABOND ADVANCED (GAUZE/BANDAGES/DRESSINGS) ×1
DERMABOND ADVANCED .7 DNX12 (GAUZE/BANDAGES/DRESSINGS) ×1 IMPLANT
DIFFUSER DRILL AIR PNEUMATIC (MISCELLANEOUS) IMPLANT
DRAPE C-ARM 42X72 X-RAY (DRAPES) ×8 IMPLANT
DRAPE C-ARMOR (DRAPES) IMPLANT
DRAPE LAPAROTOMY 100X72X124 (DRAPES) ×2 IMPLANT
DRAPE SURG 17X23 STRL (DRAPES) ×2 IMPLANT
DRSG OPSITE POSTOP 4X6 (GAUZE/BANDAGES/DRESSINGS) ×2 IMPLANT
DURAPREP 26ML APPLICATOR (WOUND CARE) ×2 IMPLANT
ELECT REM PT RETURN 9FT ADLT (ELECTROSURGICAL) ×2
ELECTRODE REM PT RTRN 9FT ADLT (ELECTROSURGICAL) ×1 IMPLANT
EVACUATOR 1/8 PVC DRAIN (DRAIN) IMPLANT
GAUZE 4X4 16PLY RFD (DISPOSABLE) IMPLANT
GLOVE BIO SURGEON STRL SZ7 (GLOVE) ×2 IMPLANT
GLOVE BIO SURGEON STRL SZ8 (GLOVE) ×4 IMPLANT
GLOVE BIOGEL PI IND STRL 6.5 (GLOVE) ×2 IMPLANT
GLOVE BIOGEL PI IND STRL 7.0 (GLOVE) ×2 IMPLANT
GLOVE BIOGEL PI INDICATOR 6.5 (GLOVE) ×2
GLOVE BIOGEL PI INDICATOR 7.0 (GLOVE) ×2
GLOVE SURG SS PI 6.0 STRL IVOR (GLOVE) ×10 IMPLANT
GOWN STRL REUS W/ TWL LRG LVL3 (GOWN DISPOSABLE) ×3 IMPLANT
GOWN STRL REUS W/ TWL XL LVL3 (GOWN DISPOSABLE) ×2 IMPLANT
GOWN STRL REUS W/TWL 2XL LVL3 (GOWN DISPOSABLE) IMPLANT
GOWN STRL REUS W/TWL LRG LVL3 (GOWN DISPOSABLE) ×6
GOWN STRL REUS W/TWL XL LVL3 (GOWN DISPOSABLE) ×4
HEMOSTAT POWDER KIT SURGIFOAM (HEMOSTASIS) ×2 IMPLANT
KIT BASIN OR (CUSTOM PROCEDURE TRAY) ×2 IMPLANT
KIT BONE MRW ASP ANGEL CPRP (KITS) ×2 IMPLANT
KIT TURNOVER KIT B (KITS) ×2 IMPLANT
MILL MEDIUM DISP (BLADE) IMPLANT
NEEDLE 18GX1X1/2 (RX/OR ONLY) (NEEDLE) ×4 IMPLANT
NEEDLE HYPO 25X1 1.5 SAFETY (NEEDLE) ×2 IMPLANT
NS IRRIG 1000ML POUR BTL (IV SOLUTION) ×2 IMPLANT
PACK LAMINECTOMY NEURO (CUSTOM PROCEDURE TRAY) ×2 IMPLANT
PAD ARMBOARD 7.5X6 YLW CONV (MISCELLANEOUS) ×6 IMPLANT
PUTTY DBM ALLOSYNC PURE 10CC (Putty) ×2 IMPLANT
ROD LORD LIPPED TI 5.5X50 (Rod) ×4 IMPLANT
SCREW POLYAXIAL TULIP (Screw) ×12 IMPLANT
SCREW SHANK MOD 5.5X40 (Screw) ×12 IMPLANT
SET SCREW (Screw) ×12 IMPLANT
SET SCREW SPNE (Screw) ×6 IMPLANT
SPACER BATTALION 5 .25X10MM 9 (Spacer) ×4 IMPLANT
SPACER PEEK PS 25X9MM 9MM 5DEG (Spacer) ×4 IMPLANT
SPONGE LAP 4X18 RFD (DISPOSABLE) IMPLANT
SPONGE SURGIFOAM ABS GEL 100 (HEMOSTASIS) ×2 IMPLANT
STRIP CLOSURE SKIN 1/2X4 (GAUZE/BANDAGES/DRESSINGS) ×2 IMPLANT
SUT VIC AB 0 CT1 18XCR BRD8 (SUTURE) ×1 IMPLANT
SUT VIC AB 0 CT1 8-18 (SUTURE) ×2
SUT VIC AB 2-0 CP2 18 (SUTURE) ×2 IMPLANT
SUT VIC AB 3-0 SH 8-18 (SUTURE) ×4 IMPLANT
SYR 20CC LL (SYRINGE) ×2 IMPLANT
SYR CONTROL 10ML LL (SYRINGE) ×2 IMPLANT
TOWEL GREEN STERILE (TOWEL DISPOSABLE) ×2 IMPLANT
TOWEL GREEN STERILE FF (TOWEL DISPOSABLE) ×2 IMPLANT
TRAY FOLEY MTR SLVR 16FR STAT (SET/KITS/TRAYS/PACK) ×2 IMPLANT
TRAY FOLEY SLVR 16FR TEMP STAT (SET/KITS/TRAYS/PACK) ×2 IMPLANT
WATER STERILE IRR 1000ML POUR (IV SOLUTION) ×2 IMPLANT

## 2019-12-02 NOTE — H&P (Signed)
Subjective: Patient is a 79 y.o. female admitted for leg weakness and difficulty walking. Onset of symptoms was several months ago, gradually worsening since that time.  The pain is rated moderate, and is located at the across the lower back and radiates to legs. The pain is described as aching and occurs intermittently. The symptoms have been progressive. Symptoms are exacerbated by exercise. 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  He has a history of myasthenia gravis and takes prednisone 20 mg/day.  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  Past Medical History:  Diagnosis Date  . Chronic kidney disease    CKD  . Complication of anesthesia    Myasthenia Gravis  . Hypercholesteremia   . Hypertension   . Hypothyroidism   . Myasthenia gravis ([place])   . Trimalleolar fracture    RIGHT ANKLE    Past Surgical History:  Procedure Laterality Date  . colonoscopy    . EYE SURGERY Left    cataracts  . ORIF ANKLE FRACTURE Right 10/09/2018   Procedure: OPEN REDUCTION INTERNAL FIXATION (ORIF) Right ankle trimalleolar fracture;  Surgeon: Wylene Simmer, MD;  Location: [place];  Service: Orthopedics;  Laterality: Right;  73min    Prior to Admission medications   Medication Sig Start Date End Date Taking? Authorizing Provider  ALPRAZolam (XANAX) 0.25 MG tablet Take 0.25 mg by mouth at bedtime.  10/28/19  Yes [provider]  amLODipine (NORVASC) 10 MG tablet Take 1 tablet (10 mg total) by mouth daily. 07/04/19  Yes Shelly Coss, MD  aspirin EC 81 MG tablet Take 81 mg by mouth every other day.   Yes [provider]  atorvastatin (LIPITOR) 20 MG tablet Take 20 mg by mouth at bedtime.    Yes [provider]  Cholecalciferol (VITAMIN D) 50 MCG (2000 UT) tablet Take 2,000 Units by mouth at bedtime.   Yes [provider]  furosemide (LASIX)  20 MG tablet Take 10 mg by mouth every other day.    Yes [provider]  levothyroxine (SYNTHROID) 88 MCG tablet Take 88 mcg by mouth daily before breakfast.   Yes [provider]  pantoprazole (PROTONIX) 40 MG tablet Take 1 tablet (40 mg total) by mouth daily. 07/04/19 07/03/20 Yes Shelly Coss, MD  predniSONE (DELTASONE) 20 MG tablet Take 1 tablet (20 mg total) by mouth daily with breakfast. 07/22/19  Yes Patel, Donika K, DO  pyridostigmine (MESTINON) 60 MG tablet Take 1 tablet at 7am, noon, and 5pm. Patient taking differently: Take 60 mg by mouth See admin instructions. Take 1 tablet at 7am, noon, and 5pm. 07/22/19  Yes Patel, Donika K, DO  vitamin B-12 (CYANOCOBALAMIN) 1000 MCG tablet Take 1,000 mcg by mouth daily with lunch.    Yes [provider]  GAMMAPLEX 10 GM/100ML SOLN Inject into the vein every 21 ( twenty-one) days.  07/17/19   [provider]   Allergies  Allergen Reactions  . Other     Pt has Myasthenia gravis so all anesthesia must be pre-approved    Social History   Tobacco Use  . Smoking status: Former Smoker    Types: Cigarettes    Quit date: 1970    Years since quitting: 51.6  . Smokeless tobacco: Never Used  Substance Use Topics  . Alcohol use: Not Currently    Family History  Problem Relation Age of Onset  .  Leukemia Sister      Review of Systems  Positive ROS: Negative  All other systems have been reviewed and were otherwise negative with the exception of those mentioned in the HPI and as above.  Objective: Vital signs in last 24 hours: Temp:  [97.8 F (36.6 C)] 97.8 F (36.6 C) (08/25 0556) Pulse Rate:  [91] 91 (08/25 0556) Resp:  [17] 17 (08/25 0556) BP: (156)/(83) 156/83 (08/25 0556) SpO2:  [99 %] 99 % (08/25 0556) Weight:  [66.4 kg] 66.4 kg (08/25 0645)  General Appearance: Alert, cooperative, no distress, appears stated age Head: Normocephalic, without obvious abnormality, atraumatic Eyes: PERRL,  conjunctiva/corneas clear, EOM's intact    Neck: Supple, symmetrical, trachea midline Back: Symmetric, no curvature, ROM normal, no CVA tenderness Lungs:  respirations unlabored Heart: Regular rate and rhythm Abdomen: Soft, non-tender Extremities: Extremities normal, atraumatic, no cyanosis or edema Pulses: 2+ and symmetric all extremities Skin: Skin color, texture, turgor normal, no rashes or lesions  NEUROLOGIC:   Mental status: Alert and oriented x4,  no aphasia, good attention span, fund of knowledge, and memory Motor Exam - grossly normal Sensory Exam - grossly normal Reflexes: 1+ Coordination - grossly normal Gait -not tested Balance -not tested Cranial Nerves: I: smell Not tested  II: visual acuity  OS: nl    OD: nl  II: visual fields Full to confrontation  II: pupils Equal, round, reactive to light  III,VII: ptosis None  III,IV,VI: extraocular muscles  Full ROM  V: mastication Normal  V: facial light touch sensation  Normal  V,VII: corneal reflex  Present  VII: facial muscle function - upper  Normal  VII: facial muscle function - lower Normal  VIII: hearing Not tested  IX: soft palate elevation  Normal  IX,X: gag reflex Present  XI: trapezius strength  5/5  XI: sternocleidomastoid strength 5/5  XI: neck flexion strength  5/5  XII: tongue strength  Normal    Data Review Lab Results  Component Value Date   WBC 14.3 (H) 12/02/2019   HGB 14.7 12/02/2019   HCT 45.2 12/02/2019   MCV 93.4 12/02/2019   PLT 275 12/02/2019   Lab Results  Component Value Date   NA 139 11/30/2019   K 3.7 11/30/2019   CL 102 11/30/2019   CO2 22 11/30/2019   BUN 25 (H) 11/30/2019   CREATININE 1.13 (H) 11/30/2019   GLUCOSE 112 (H) 11/30/2019   Lab Results  Component Value Date   INR 0.9 11/30/2019    Assessment/Plan:  Estimated body mass index is 23.98 kg/m as calculated from the following:   Height as of this encounter: 5' 5.5" (1.664 m).   Weight as of this encounter:  66.4 kg. Patient admitted for decompression and fusion L3-4 L4-5 for severe spinal stenosis and instability. Patient has failed a reasonable attempt at conservative therapy.  I explained the condition and procedure to the patient and answered any questions.  Patient wishes to proceed with procedure as planned. Understands risks/ benefits and typical outcomes of procedure.   Eustace Moore 12/02/2019 8:24 AM

## 2019-12-02 NOTE — Anesthesia Procedure Notes (Signed)
Procedure Name: Intubation Date/Time: 12/02/2019 8:35 AM Performed by: Genelle Bal, CRNA Pre-anesthesia Checklist: Patient identified, Emergency Drugs available, Suction available and Patient being monitored Patient Re-evaluated:Patient Re-evaluated prior to induction Oxygen Delivery Method: Circle system utilized Preoxygenation: Pre-oxygenation with 100% oxygen Induction Type: IV induction Ventilation: Mask ventilation without difficulty Laryngoscope Size: Miller and 2 Grade View: Grade I Tube type: Oral Tube size: 7.0 mm Number of attempts: 1 Airway Equipment and Method: Stylet Placement Confirmation: ETT inserted through vocal cords under direct vision,  positive ETCO2 and breath sounds checked- equal and bilateral Secured at: 20 cm Tube secured with: Tape Dental Injury: Teeth and Oropharynx as per pre-operative assessment

## 2019-12-02 NOTE — Op Note (Signed)
12/02/2019  12:21 PM  PATIENT:  Audrey Cowan  79 y.o. 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 Decompressive lumbar laminectomy hemifacetectomy and foraminotomies to perform Smith-Petersen osteotomies L3 4 and L4-5 requiring more work than would be required for a simple exposure of the disk for PLIF in order to adequately decompress the neural elements and address the spinal stenosis and address her sagittal imbalance 2. Posterior lumbar interbody fusion L3 4 and L4-5 using peek interbody cages packed with morcellized allograft and autograft soaked with a bone marrow aspirate obtained through a separate fascial incision over the right iliac crest 3. Posterior fixation L3-L5 inclusive using Alphatec cortical pedicle screws.  4. Intertransverse arthrodesis L3-L5 using morcellized autograft and allograft.  SURGEON:  Sherley Bounds, MD  ASSISTANTS: Duffy Rhody, MD  ANESTHESIA:  General  EBL: 300 ml  Total I/O In: 1600 [I.V.:1600] Out: 850 [Urine:550; Blood:300]  BLOOD ADMINISTERED:none  DRAINS: none   INDICATION FOR PROCEDURE: This patient presented with back and leg pain with a feeling of weakness in the legs. Imaging revealed severe stenosis L3-4 and L4-5 with a grade 1 spondylolisthesis L3-4 and a grade 2 spondylolisthesis L4-5 with loss of sagittal balance. The patient tried a reasonable attempt at conservative medical measures without relief. I recommended decompression and instrumented fusion to address the stenosis as well as the segmental  instability.  Patient understood the risks, benefits, and alternatives and potential outcomes and wished to proceed.  PROCEDURE DETAILS:  The patient was brought to the operating room. After induction of  generalized endotracheal anesthesia the patient was rolled into the prone position on chest rolls and all pressure points were padded. The patient's lumbar region was cleaned and then prepped with DuraPrep and draped in the usual sterile fashion. Anesthesia was injected and then a dorsal midline incision was made and carried down to the lumbosacral fascia. The fascia was opened and the paraspinous musculature was taken down in a subperiosteal fashion to expose L3 4 and L4-5. A self-retaining retractor was placed.  She had very severe facet arthropathy at L3-4 and L4-5.  Intraoperative fluoroscopy confirmed my level, and I started with placement of the L3 cortical pedicle screws. The pedicle screw entry zones were identified utilizing surface landmarks and  AP and lateral fluoroscopy. I scored the cortex with the high-speed drill and then used the hand drill to drill an upward and outward direction into the pedicle. I then tapped line to line. I then placed a 5.5 x 40 mm cortical pedicle screw into the pedicles of L3 bilaterally.  I then dissected in a suprafascial plane to expose the iliac crest.  Opened the fascia and we used a Jamshidi needle to extract 60 cc of bone marrow aspirate from the iliac crest with the assistance of my nurse practitioner.  This was then spun down by [facility] device and 2 to 4 cc of  BMAC was soaked on morselized allograft for later arthrodesis.  I dried the hole with Surgifoam and closed the fascia.  I then turned my attention to the decompression and complete lumbar laminectomies, hemi- facetectomies, and foraminotomies were performed at L3-4 and L4-5.  We performed Smith-Petersen osteotomies at both levels by cutting across the pars and the facet to remove the facets and remove the posterior elements. my  nurse practitioner was directly involved in the decompression and exposure of the neural elements. the patient had significant spinal stenosis and this required more work than would be  required for a simple exposure of the disc for posterior lumbar interbody fusion which would only require a limited laminotomy. Much more generous decompression and generous foraminotomy was undertaken in order to adequately decompress the neural elements and address the patient's leg pain. The yellow ligament was removed to expose the underlying dura and nerve roots, and generous foraminotomies were performed to adequately decompress the neural elements. Both the exiting and traversing nerve roots were decompressed on both sides until a coronary dilator passed easily along the nerve roots. Once the decompression was complete, I turned my attention to the posterior lower lumbar interbody fusion. The epidural venous vasculature was coagulated and cut sharply. Disc space was incised and the initial discectomy was performed with pituitary rongeurs. The disc space was distracted with sequential distractors to a height of 9 mm at L3-4 and 10 mm at L4-5. We then used a series of scrapers and shavers to prepare the endplates for fusion. The midline was prepared with Epstein curettes. Once the complete discectomy was finished, we packed an appropriate sized interbody cage with local autograft and morcellized allograft, gently retracted the nerve root, and tapped the cage into position at L3-4 and L4-5 bilaterally.  The midline between the cages was packed with morselized autograft and allograft. We then turned our attention to the placement of the lower pedicle screws. The pedicle screw entry zones were identified utilizing surface landmarks and fluoroscopy. I drilled into each pedicle utilizing the hand drill, and tapped each pedicle with the appropriate tap. We palpated with a ball probe to assure no break in the cortex. We then placed 5.5 x 40 mm cortical pedicle screws into the pedicles bilaterally at L4 and L5 bilaterally.  My nurse practitioner assisted in placement of the pedicle screws.  Dr. Marcello Moores then came in to  assist with the posterior lateral fusion and the rest of the instrumentation and closure.  We then decorticated the transverse processes and laid a mixture of morcellized autograft and allograft out over these to perform intertransverse arthrodesis at L3 4 and L4-5. We then placed lordotic rods into the multiaxial screw heads of the pedicle screws and locked these in position with the locking caps and anti-torque device. We then checked our construct with AP and lateral fluoroscopy. Irrigated with copious amounts of bacitracin-containing saline solution. Inspected the nerve roots once again to assure adequate decompression, lined to the dura with Gelfoam, placed powdered vancomycin into the wound, and then we closed the muscle and the fascia with 0 Vicryl. Closed the subcutaneous tissues with 2-0 Vicryl and subcuticular tissues with 3-0 Vicryl. The skin was closed with benzoin and Steri-Strips. Dressing was then applied, the patient was awakened from general anesthesia and transported to the recovery room in stable condition. At the end of the procedure all sponge, needle and instrument counts were correct.   PLAN OF CARE: admit to inpatient  PATIENT DISPOSITION:  PACU - hemodynamically stable.   Delay start of Pharmacological VTE agent (>24hrs) due to surgical blood loss or risk of bleeding:  yes

## 2019-12-02 NOTE — Anesthesia Postprocedure Evaluation (Signed)
Anesthesia Post Note  Patient: Audrey Cowan  Procedure(s) Performed: Posterior Lumbar Interbody Fusion Lumbar three-four, Lumbar four-five (N/A Back)     Patient location during evaluation: PACU Anesthesia Type: General Level of consciousness: sedated Pain management: pain level controlled Vital Signs Assessment: post-procedure vital signs reviewed and stable Respiratory status: spontaneous breathing and respiratory function stable Cardiovascular status: stable Postop Assessment: no apparent nausea or vomiting Anesthetic complications: no   No complications documented.  Last Vitals:  Vitals:   12/02/19 1223 12/02/19 1238  BP: 119/71 107/74  Pulse: 85 96  Resp: 12 15  Temp: (!) 36.2 C   SpO2: 99% 95%    Last Pain:  Vitals:   12/02/19 1223  TempSrc:   PainSc: 0-No pain                 Jakeb Lamping DANIEL

## 2019-12-02 NOTE — Transfer of Care (Signed)
Immediate Anesthesia Transfer of Care Note  Patient: Audrey Cowan  Procedure(s) Performed: Posterior Lumbar Interbody Fusion Lumbar three-four, Lumbar four-five (N/A Back)  Patient Location: PACU  Anesthesia Type:General  Level of Consciousness: awake, alert  and oriented  Airway & Oxygen Therapy: Patient Spontanous Breathing and Patient connected to face mask oxygen  Post-op Assessment: Report given to RN and Post -op Vital signs reviewed and stable  Post vital signs: Reviewed and stable  Last Vitals:  Vitals Value Taken Time  BP 119/71 12/02/19 1222  Temp    Pulse 84 12/02/19 1224  Resp 15 12/02/19 1224  SpO2 99 % 12/02/19 1224  Vitals shown include unvalidated device data.  Last Pain:  Vitals:   12/02/19 0645  TempSrc:   PainSc: 0-No pain         Complications: No complications documented.

## 2019-12-03 DIAGNOSIS — M48061 Spinal stenosis, lumbar region without neurogenic claudication: Secondary | ICD-10-CM | POA: Diagnosis not present

## 2019-12-03 DIAGNOSIS — Z87891 Personal history of nicotine dependence: Secondary | ICD-10-CM | POA: Diagnosis not present

## 2019-12-03 DIAGNOSIS — R531 Weakness: Secondary | ICD-10-CM | POA: Diagnosis not present

## 2019-12-03 DIAGNOSIS — M5116 Intervertebral disc disorders with radiculopathy, lumbar region: Secondary | ICD-10-CM | POA: Diagnosis not present

## 2019-12-03 DIAGNOSIS — I10 Essential (primary) hypertension: Secondary | ICD-10-CM | POA: Diagnosis not present

## 2019-12-03 DIAGNOSIS — M4316 Spondylolisthesis, lumbar region: Secondary | ICD-10-CM | POA: Diagnosis not present

## 2019-12-03 MED ORDER — OXYCODONE HCL 5 MG PO TABS
5.0000 mg | ORAL_TABLET | Freq: Four times a day (QID) | ORAL | 0 refills | Status: DC | PRN
Start: 2019-12-03 — End: 2020-08-03

## 2019-12-03 NOTE — Discharge Summary (Signed)
Physician Discharge Summary  Patient ID: Audrey Cowan MRN: [id number] DOB/AGE: Sep 07, 1940 79 y.o.  Admit date: 12/02/2019 Discharge date: 12/03/2019  Admission Diagnoses: spondylolisthesis with stenosis    Discharge Diagnoses: same   Discharged Condition: good  Hospital Course: The patient was admitted on 12/02/2019 and taken to the operating room where the patient underwent PLIF L3-4 L4-5. The patient tolerated the procedure well and was taken to the recovery room and then to the floor in stable condition. The hospital course was routine. There were no complications. The wound remained clean dry and intact. Pt had appropriate back soreness. No complaints of leg pain or new N/T/W. The patient remained afebrile with stable vital signs, and tolerated a regular diet. The patient continued to increase activities, and pain was well controlled with oral pain medications.   Consults: None  Significant Diagnostic Studies:  Results for orders placed or performed during the hospital encounter of 12/02/19  CBC  Result Value Ref Range   WBC 14.3 (H) 4.0 - 10.5 K/uL   RBC 4.84 3.87 - 5.11 MIL/uL   Hemoglobin 14.7 12.0 - 15.0 g/dL   HCT 45.2 36 - 46 %   MCV 93.4 80.0 - 100.0 fL   MCH 30.4 26.0 - 34.0 pg   MCHC 32.5 30.0 - 36.0 g/dL   RDW 14.1 11.5 - 15.5 %   Platelets 275 150 - 400 K/uL   nRBC 0.0 0.0 - 0.2 %  Urinalysis, Complete w Microscopic Urine, Clean Catch  Result Value Ref Range   Color, Urine YELLOW YELLOW   APPearance CLEAR CLEAR   Specific Gravity, Urine 1.016 1.005 - 1.030   pH 7.0 5.0 - 8.0   Glucose, UA NEGATIVE NEGATIVE mg/dL   Hgb urine dipstick NEGATIVE NEGATIVE   Bilirubin Urine NEGATIVE NEGATIVE   Ketones, ur NEGATIVE NEGATIVE mg/dL   Protein, ur NEGATIVE NEGATIVE mg/dL   Nitrite NEGATIVE NEGATIVE   Leukocytes,Ua NEGATIVE NEGATIVE   RBC / HPF 0-5 0 - 5 RBC/hpf   WBC, UA 0-5 0 - 5 WBC/hpf   Bacteria, UA NONE SEEN NONE SEEN   Squamous Epithelial / LPF 0-5 0 - 5   ABO/Rh  Result Value Ref Range   ABO/RH(D)      O POS Performed at [facility], [address] [place], [place] [address]     Chest 2 View  Result Date: 11/30/2019 CLINICAL DATA:  Pre-admission for back surgery. EXAM: CHEST - 2 VIEW COMPARISON:  June 24, 2019. FINDINGS: The heart size and mediastinal contours are within normal limits. No pneumothorax or pleural effusion is noted. Right lung is clear. Mild left basilar subsegmental atelectasis is noted. The visualized skeletal structures are unremarkable. IMPRESSION: Mild left basilar subsegmental atelectasis. Electronically Signed   By: Marijo Conception M.D.   On: 11/30/2019 12:48   DG Lumbar Spine 2-3 Views  Result Date: 12/02/2019 CLINICAL DATA:  Status post posterior fusion EXAM: DG C-ARM 1-60 MIN; LUMBAR SPINE - 2-3 VIEW FLUOROSCOPY TIME:  Fluoroscopy Time:  0 minutes 58 seconds Radiation Exposure Index (if provided by the fluoroscopic device): 32.48 mGy Number of Acquired Spot Images: 2 COMPARISON:  Lumbar radiographs October 27, 2019 FINDINGS: Frontal and lateral views were obtained. There is posterior screw and plate fixation at L3, L4, and L5. There are disc spacers at L3-4 and L4-5. Support hardware appears intact. Pedicle screws are in the respective vertebral bodies. No fracture evident. There is persistent anterolisthesis at L3-4 and L4-5, more severe at L4-5,  stable. There is moderate disc space narrowing at L5-S1. IMPRESSION: Postoperative screw and plate fixation from P5-W6 with support hardware intact. Disc spacers at L3-4 and L4-5. Stable spondylolisthesis at L3-4 and L4-5. No fracture. Stable disc space narrowing at L5-S1. Electronically Signed   By: Lowella Grip III M.D.   On: 12/02/2019 12:14   DG C-Arm 1-60 Min  Result Date: 12/02/2019 CLINICAL DATA:  Status post posterior fusion EXAM: DG C-ARM 1-60 MIN; LUMBAR SPINE - 2-3 VIEW FLUOROSCOPY TIME:  Fluoroscopy Time:  0 minutes 58 seconds Radiation Exposure Index (if  provided by the fluoroscopic device): 32.48 mGy Number of Acquired Spot Images: 2 COMPARISON:  Lumbar radiographs October 27, 2019 FINDINGS: Frontal and lateral views were obtained. There is posterior screw and plate fixation at L3, L4, and L5. There are disc spacers at L3-4 and L4-5. Support hardware appears intact. Pedicle screws are in the respective vertebral bodies. No fracture evident. There is persistent anterolisthesis at L3-4 and L4-5, more severe at L4-5, stable. There is moderate disc space narrowing at L5-S1. IMPRESSION: Postoperative screw and plate fixation from F6-C1 with support hardware intact. Disc spacers at L3-4 and L4-5. Stable spondylolisthesis at L3-4 and L4-5. No fracture. Stable disc space narrowing at L5-S1. Electronically Signed   By: Lowella Grip III M.D.   On: 12/02/2019 12:14    Antibiotics:  Anti-infectives (From admission, onward)   Start     Dose/Rate Route Frequency Ordered Stop   12/02/19 1500  ceFAZolin (ANCEF) IVPB 2g/100 mL premix        2 g 200 mL/hr over 30 Minutes Intravenous Every 8 hours 12/02/19 1455 12/02/19 2301   12/02/19 0939  bacitracin 50,000 Units in sodium chloride 0.9 % 500 mL irrigation  Status:  Discontinued          As needed 12/02/19 0939 12/02/19 1218   12/02/19 0645  ceFAZolin (ANCEF) IVPB 2g/100 mL premix        2 g 200 mL/hr over 30 Minutes Intravenous On call to O.R. 12/02/19 0630 12/02/19 0911   12/02/19 0600  ceFAZolin (ANCEF) IVPB 2g/100 mL premix  Status:  Discontinued        2 g 200 mL/hr over 30 Minutes Intravenous On call to O.R. 12/02/19 0547 12/02/19 1438      Discharge Exam: Blood pressure (!) 145/74, pulse (!) 104, temperature 97.8 F (36.6 C), resp. rate 18, height 5' 5.5" (1.664 m), weight 66.4 kg, SpO2 98 %. Neurologic: Grossly normal Dressing dry  Discharge Medications:   Allergies as of 12/03/2019      Reactions   Other    Pt has Myasthenia gravis so all anesthesia must be pre-approved      Medication List     TAKE these medications   ALPRAZolam 0.25 MG tablet Commonly known as: XANAX Take 0.25 mg by mouth at bedtime.   amLODipine 10 MG tablet Commonly known as: NORVASC Take 1 tablet (10 mg total) by mouth daily.   aspirin EC 81 MG tablet Take 81 mg by mouth every other day.   atorvastatin 20 MG tablet Commonly known as: LIPITOR Take 20 mg by mouth at bedtime.   furosemide 20 MG tablet Commonly known as: LASIX Take 10 mg by mouth every other day.   Gammaplex 10G/170mL (10,000mg /160mL) Soln Generic drug: Immune Globulin 10% Inject into the vein every 21 ( twenty-one) days.   levothyroxine 88 MCG tablet Commonly known as: SYNTHROID Take 88 mcg by mouth daily before breakfast.   oxyCODONE 5  MG immediate release tablet Commonly known as: Oxy IR/ROXICODONE Take 1 tablet (5 mg total) by mouth every 6 (six) hours as needed for moderate pain ((score 4 to 6)).   pantoprazole 40 MG tablet Commonly known as: Protonix Take 1 tablet (40 mg total) by mouth daily.   predniSONE 20 MG tablet Commonly known as: DELTASONE Take 1 tablet (20 mg total) by mouth daily with breakfast.   pyridostigmine 60 MG tablet Commonly known as: MESTINON Take 1 tablet at 7am, noon, and 5pm. What changed:   how much to take  how to take this  when to take this   vitamin B-12 1000 MCG tablet Commonly known as: CYANOCOBALAMIN Take 1,000 mcg by mouth daily with lunch.   Vitamin D 50 MCG (2000 UT) tablet Take 2,000 Units by mouth at bedtime.            Durable Medical Equipment  (From admission, onward)         Start     Ordered   12/02/19 1456  DME Walker rolling  Once       Question:  Patient needs a walker to treat with the following condition  Answer:  S/P lumbar fusion   12/02/19 1455   12/02/19 1456  DME 3 n 1  Once        12/02/19 1455          Disposition: home   Final Dx: PLIF L3-4 L4-5  Discharge Instructions     Remove dressing in 72 hours   Complete by: As  directed    Call MD for:  difficulty breathing, headache or visual disturbances   Complete by: As directed    Call MD for:  persistant nausea and vomiting   Complete by: As directed    Call MD for:  redness, tenderness, or signs of infection (pain, swelling, redness, odor or green/yellow discharge around incision site)   Complete by: As directed    Call MD for:  severe uncontrolled pain   Complete by: As directed    Call MD for:  temperature >100.4   Complete by: As directed    Diet - low sodium heart healthy   Complete by: As directed    Increase activity slowly   Complete by: As directed        Follow-up Information    Eustace Moore, MD. Schedule an appointment as soon as possible for a visit in 2 week(s).   Specialty: Neurosurgery Contact information: [address] [place] [address] [place] [address] [phone]                Signed: Eustace Moore 12/03/2019, 7:59 AM

## 2019-12-03 NOTE — Evaluation (Signed)
Physical Therapy Evaluation Patient Details Name: Audrey Cowan MRN: [id number] DOB: 1940/11/24 Today's Date: 12/03/2019   History of Present Illness  Pt is a 79 y/o female who presents s/p L3-L5 PLIF on 12/02/2019. PMH significant for Myasthenia Gravis, R trimalleolar fracture, hypothyroidism, HTN, CKD.    Clinical Impression  Pt admitted with above diagnosis. At the time of PT eval, pt was able to demonstrate transfers and ambulation with gross min guard assist to supervision for safety with the RW. Pt was educated on precautions, brace application/wearing schedule, appropriate activity progression, and car transfer. Pt currently with functional limitations due to the deficits listed below (see PT Problem List). Pt will benefit from skilled PT to increase their independence and safety with mobility to allow discharge to the venue listed below.      Follow Up Recommendations Home health PT;Supervision for mobility/OOB    Equipment Recommendations  None recommended by PT    Recommendations for Other Services       Precautions / Restrictions Precautions Precautions: Fall;Back Precaution Booklet Issued: Yes (comment) Precaution Comments: reviewed back precautions related to ADL and IADL Required Braces or Orthoses: Spinal Brace Spinal Brace: Lumbar corset;Applied in sitting position Restrictions Weight Bearing Restrictions: No      Mobility  Bed Mobility Overal bed mobility: Needs Assistance Bed Mobility: Sit to Sidelying;Rolling Rolling: Supervision Sidelying to sit: Supervision     Sit to sidelying: Min guard General bed mobility comments: cues for sequencing log roll  Transfers Overall transfer level: Needs assistance Equipment used: Rolling walker (2 wheeled) Transfers: Sit to/from Stand Sit to Stand: Supervision         General transfer comment: Pt demonstrated proper hand placement on seated surface for safety.   Ambulation/Gait Ambulation/Gait assistance:  Supervision;Min guard Gait Distance (Feet): 300 Feet Assistive device: Rolling walker (2 wheeled) Gait Pattern/deviations: Step-through pattern;Decreased stride length;Trunk flexed Gait velocity: Decreased Gait velocity interpretation: <1.31 ft/sec, indicative of household ambulator General Gait Details: VC's for improved posture and closer walker proximity. Pt self-correcting throughout gait trainiing and self-monitoring for fatigue due to the Myasthenia Gravis  Stairs Stairs: Yes Stairs assistance: Min guard Stair Management: Two rails;Step to pattern;Forwards Number of Stairs: 3 (1 step x3 trials) General stair comments: VC's for sequencing and safety. Instructed pt to consider RLE her "bad" leg as she is still having deficits from ankle fracture.   Wheelchair Mobility    Modified Rankin (Stroke Patients Only)       Balance Overall balance assessment: Needs assistance   Sitting balance-Leahy Scale: Good     Standing balance support: No upper extremity supported Standing balance-Leahy Scale: Fair Standing balance comment: statically                             Pertinent Vitals/Pain Pain Assessment: Faces Faces Pain Scale: Hurts a little bit Pain Location: back/incision site Pain Descriptors / Indicators: Operative site guarding Pain Intervention(s): Limited activity within patient's tolerance;Monitored during session;Repositioned    Home Living Family/patient expects to be discharged to:: Private residence Living Arrangements: Children Available Help at Discharge: Family;Available PRN/intermittently Type of Home: House Home Access: Stairs to enter Entrance Stairs-Rails: Right;Left;Can reach both Entrance Stairs-Number of Steps: 4 Home Layout: One level Home Equipment: Tub bench;Walker - 2 wheels;Cane - single point;Hand held shower head Additional Comments: Above information is for son's home    Prior Function Level of Independence: Independent with  assistive device(s)  Comments: used RW PTA     Hand Dominance   Dominant Hand: Right    Extremity/Trunk Assessment   Upper Extremity Assessment Upper Extremity Assessment: Defer to OT evaluation    Lower Extremity Assessment Lower Extremity Assessment: RLE deficits/detail RLE Deficits / Details: Generalized weakness bilaterally however still with deficits from prior R ankle fracture.     Cervical / Trunk Assessment Cervical / Trunk Assessment: Kyphotic;Other exceptions Cervical / Trunk Exceptions: s/p surgery  Communication   Communication: HOH;Expressive difficulties  Cognition Arousal/Alertness: Awake/alert Behavior During Therapy: Anxious Overall Cognitive Status: Within Functional Limits for tasks assessed                                 General Comments: some difficulty generalizing back precautions       General Comments      Exercises     Assessment/Plan    PT Assessment Patient needs continued PT services  PT Problem List Decreased strength;Decreased activity tolerance;Decreased balance;Decreased mobility;Decreased knowledge of use of DME;Decreased safety awareness;Decreased knowledge of precautions;Pain       PT Treatment Interventions DME instruction;Gait training;Stair training;Functional mobility training;Therapeutic activities;Therapeutic exercise;Neuromuscular re-education;Patient/family education    PT Goals (Current goals can be found in the Care Plan section)  Acute Rehab PT Goals Patient Stated Goal: return home with son PT Goal Formulation: With patient Time For Goal Achievement: 12/10/19 Potential to Achieve Goals: Good    Frequency Min 5X/week   Barriers to discharge Decreased caregiver support Will be alone at times    Co-evaluation               AM-PAC PT "6 Clicks" Mobility  Outcome Measure Help needed turning from your back to your side while in a flat bed without using bedrails?: None Help  needed moving from lying on your back to sitting on the side of a flat bed without using bedrails?: None Help needed moving to and from a bed to a chair (including a wheelchair)?: None Help needed standing up from a chair using your arms (e.g., wheelchair or bedside chair)?: None Help needed to walk in hospital room?: None Help needed climbing 3-5 steps with a railing? : None 6 Click Score: 24    End of Session Equipment Utilized During Treatment: Gait belt Activity Tolerance: Patient tolerated treatment well Patient left: in bed;with call bell/phone within reach (Sitting EOB awaiting OT) Nurse Communication: Mobility status PT Visit Diagnosis: Unsteadiness on feet (R26.81);Pain Pain - part of body:  (back)    Time: 1916-6060 PT Time Calculation (min) (ACUTE ONLY): 35 min   Charges:   PT Evaluation $PT Eval Moderate Complexity: 1 Mod PT Treatments $Gait Training: 8-22 mins        Rolinda Roan, PT, DPT Acute Rehabilitation Services Pager: [phone] Office: [phone]   Thelma Comp 12/03/2019, 1:54 PM

## 2019-12-03 NOTE — Progress Notes (Signed)
Patient discharged home with son at bedside. Discharged instructions given to patient and son with stated understanding of instructions given. All belonging with patient.Patient has an appointment with Dr. Ronnald Ramp in 2 weeks

## 2019-12-03 NOTE — TOC Transition Note (Addendum)
Transition of Care [facility]) - CM/SW Discharge Note   Patient Details  Name: Audrey Cowan MRN: [id number] Date of Birth: [birth year]  Transition of Care [facility]) CM/SW Contact:  Zenon Mayo, RN Phone Number: 12/03/2019, 12:14 PM   Clinical Narrative:    Patient is for dc today, per Casey Burkitt , patient wants [facility] agency for [place], [place].  NCM made referral to [facility] with [facility] for [place], [place].  Awaiting call back to see if he can take referral.  Patient is also interested in [place], NCM will ask Tommi Rumps about this as well.  Tommi Rumps is able to take referral for HHPT, HHOT, but for the home first program they do not have enough aides at this time due to covid.    Final next level of care: Home w Home Health Services Barriers to Discharge: No Barriers Identified   Patient Goals and CMS Choice   CMS Medicare.gov Compare Post Acute Care list provided to:: Patient Choice offered to / list presented to : Patient  Discharge Placement                       Discharge Plan and Services                          HH Arranged: PT, OT [facility] Agency: [place] Date [place]: 12/03/19 Time [place]: 2248 Representative spoke with at [place]: [place] ([place]) Interventions     Readmission Risk Interventions No flowsheet data found.

## 2019-12-03 NOTE — Evaluation (Signed)
Occupational Therapy Evaluation Patient Details Name: Audrey Cowan MRN: [id number] DOB: 07/19/1940 Today's Date: 12/03/2019    History of Present Illness Pt is a 79 y/o female who presents s/p L3-L5 PLIF on 12/02/2019. PMH significant for Myasthenia Gravis, R trimalleolar fracture, hypothyroidism, HTN, CKD.   Clinical Impression   Pt was functioning modified independently using a cane or RW prior to admission. She demonstrated ability to dress with supervision and cues for back precautions. Educated pt at length in IADL to avoid and compensatory strategies for ADL adhering to back precautions. Reinforced with written handout. Pt with some difficulty generalizing precautions. Recommending HHOT to reinforce as pt's son is available intermittently.    Follow Up Recommendations  Home health OT    Equipment Recommendations  None recommended by OT    Recommendations for Other Services       Precautions / Restrictions Precautions Precautions: Fall;Back Precaution Booklet Issued: Yes (comment) Precaution Comments: reviewed back precautions related to ADL and IADL Required Braces or Orthoses: Spinal Brace Spinal Brace: Lumbar corset;Applied in sitting position Restrictions Weight Bearing Restrictions: No      Mobility Bed Mobility Overal bed mobility: Needs Assistance Bed Mobility: Sit to Sidelying;Rolling Rolling: Supervision      Sit to sidelying: Min guard General bed mobility comments: cues for sequencing log roll  Transfers Overall transfer level: Needs assistance Equipment used: Rolling walker (2 wheeled) Transfers: Sit to/from Stand Sit to Stand: Supervision              Balance Overall balance assessment: Needs assistance   Sitting balance-Leahy Scale: Good       Standing balance-Leahy Scale: Fair Standing balance comment: can stand unsupported to manage pants                           ADL either performed or assessed with clinical  judgement   ADL Overall ADL's : Needs assistance/impaired Eating/Feeding: Independent   Grooming: Supervision/safety;Standing Grooming Details (indicate cue type and reason): educated in 2 cup method for tooth brushing Upper Body Bathing: Minimal assistance;Sitting Upper Body Bathing Details (indicate cue type and reason): recommended long handled bath sponge for back Lower Body Bathing: Supervison/ safety;Sit to/from stand Lower Body Bathing Details (indicate cue type and reason): instructed pt to use long handled bath sponge and lean side to side on her tub bench Upper Body Dressing : Set up;Sitting   Lower Body Dressing: Supervision/safety;Sit to/from stand Lower Body Dressing Details (indicate cue type and reason): able to use figure 4 method Toilet Transfer: Supervision/safety;Ambulation;RW     Toileting - Clothing Manipulation Details (indicate cue type and reason): instructed to avoid twisting with pericare and in compensatory strategies     Functional mobility during ADLs: Supervision/safety;Rolling walker General ADL Comments: Educated pt in IADL to avoid.     Vision Patient Visual Report: No change from baseline       Perception     Praxis      Pertinent Vitals/Pain Pain Assessment: Faces Faces Pain Scale: Hurts a little bit Pain Location: back/incision site Pain Descriptors / Indicators: Operative site guarding Pain Intervention(s): Repositioned;Monitored during session     Hand Dominance Right   Extremity/Trunk Assessment Upper Extremity Assessment Upper Extremity Assessment: Overall WFL for tasks assessed   Lower Extremity Assessment Lower Extremity Assessment: Defer to PT evaluation RLE Deficits / Details: Generalized weakness bilaterally however still with deficits from prior R ankle fracture.    Cervical / Trunk Assessment Cervical /  Trunk Assessment: Kyphotic;Other exceptions Cervical / Trunk Exceptions: s/p surgery   Communication  Communication Communication: HOH;Expressive difficulties   Cognition Arousal/Alertness: Awake/alert Behavior During Therapy: Anxious Overall Cognitive Status: Within Functional Limits for tasks assessed                                 General Comments: some difficulty generalizing back precautions    General Comments       Exercises     Shoulder Instructions      Home Living Family/patient expects to be discharged to:: Private residence Living Arrangements: Children Available Help at Discharge: Family;Available PRN/intermittently Type of Home: House Home Access: Stairs to enter [employer] of Steps: 4 Entrance Stairs-Rails: Right;Left;Can reach both Home Layout: One level     Bathroom Shower/Tub: Teacher, early years/pre: Standard     Home Equipment: Tub bench;Walker - 2 wheels;Cane - single point;Hand held shower head   Additional Comments: Above information is for son's home      Prior Functioning/Environment Level of Independence: Independent with assistive device(s)        Comments: used RW PTA        OT Problem List: Impaired balance (sitting and/or standing);Decreased knowledge of use of DME or AE;Pain;Decreased knowledge of precautions      OT Treatment/Interventions:      OT Goals(Current goals can be found in the care plan section) Acute Rehab OT Goals Patient Stated Goal: return home with son  OT Frequency:     Barriers to D/C:            Co-evaluation              AM-PAC OT "6 Clicks" Daily Activity     Outcome Measure Help from another person eating meals?: None Help from another person taking care of personal grooming?: A Little Help from another person toileting, which includes using toliet, bedpan, or urinal?: A Little Help from another person bathing (including washing, rinsing, drying)?: A Little Help from another person to put on and taking off regular upper body clothing?: None Help from  another person to put on and taking off regular lower body clothing?: A Little 6 Click Score: 20   End of Session Equipment Utilized During Treatment: Back brace;Gait belt;Rolling walker  Activity Tolerance: Patient tolerated treatment well Patient left: in bed;with call bell/phone within reach  OT Visit Diagnosis: Unsteadiness on feet (R26.81);Other abnormalities of gait and mobility (R26.89);Pain;Muscle weakness (generalized) (M62.81)                Time: 2992-4268 OT Time Calculation (min): 33 min Charges:  OT General Charges $OT Visit: 1 Visit OT Evaluation $OT Eval Low Complexity: 1 Low OT Treatments $Self Care/Home Management : 8-22 mins  Nestor Lewandowsky, OTR/L Acute Rehabilitation Services Pager: [phone] Office: [phone]  Audrey Cowan 12/03/2019, 10:43 AM

## 2019-12-07 ENCOUNTER — Telehealth: Payer: Self-pay | Admitting: Neurology

## 2019-12-07 NOTE — Telephone Encounter (Signed)
Patient's son Leroy Sea called and left a message requesting a call back from a nurse. I called him back to get more details.   Patient had surgery for her back on 12/02/19. Since then she has been "real sleepy and dizzy." Patient's son expressed concerns her myasthenia gravis may be returning.

## 2019-12-08 ENCOUNTER — Telehealth: Payer: Self-pay

## 2019-12-08 NOTE — Telephone Encounter (Signed)
Sleepiness, dizziness, and numbness are not seen with myasthenia gravis.  Does she have difficulty swallowing, talking, double vision, eyelid droopiness?  If not, then it is probably not related to myasthenia, especially because she had an IVIG treatment right before her surgery.  When is her follow-up with her surgeon?  We can add her to my schedule tomorrow, if she would like to be seen in the office.

## 2019-12-08 NOTE — Telephone Encounter (Signed)
Noted phone call, Patients son doesn't feel as if she needs to come in blood pressure was 130/80 with no acute facial or eye drooping, etc. He will follow up with MD at scheduled and go from there.

## 2019-12-08 NOTE — Telephone Encounter (Signed)
Back surgery was done one week ago tomorrow, really weak, trouble walking legs still numb. Real weak, patient is goint to call back with blood pressure reading, any suggestions.

## 2019-12-21 ENCOUNTER — Telehealth: Payer: Self-pay | Admitting: Neurology

## 2019-12-21 ENCOUNTER — Telehealth: Payer: Self-pay

## 2019-12-21 NOTE — Telephone Encounter (Signed)
Contacted Brad and he is going to call and speak with the insurance regarding the billing on Infusions.

## 2019-12-21 NOTE — Telephone Encounter (Signed)
Patient has some questions about how her infusions are billed with insurance. They come to her home to do the infusion

## 2019-12-22 DIAGNOSIS — G7000 Myasthenia gravis without (acute) exacerbation: Secondary | ICD-10-CM | POA: Diagnosis not present

## 2019-12-22 DIAGNOSIS — N1831 Chronic kidney disease, stage 3a: Secondary | ICD-10-CM | POA: Diagnosis not present

## 2019-12-22 DIAGNOSIS — E538 Deficiency of other specified B group vitamins: Secondary | ICD-10-CM | POA: Diagnosis not present

## 2019-12-22 DIAGNOSIS — E039 Hypothyroidism, unspecified: Secondary | ICD-10-CM | POA: Diagnosis not present

## 2019-12-22 DIAGNOSIS — F419 Anxiety disorder, unspecified: Secondary | ICD-10-CM | POA: Diagnosis not present

## 2019-12-22 DIAGNOSIS — M8580 Other specified disorders of bone density and structure, unspecified site: Secondary | ICD-10-CM | POA: Diagnosis not present

## 2019-12-22 DIAGNOSIS — E7800 Pure hypercholesterolemia, unspecified: Secondary | ICD-10-CM | POA: Diagnosis not present

## 2019-12-22 DIAGNOSIS — Z0000 Encounter for general adult medical examination without abnormal findings: Secondary | ICD-10-CM | POA: Diagnosis not present

## 2019-12-22 DIAGNOSIS — Z1389 Encounter for screening for other disorder: Secondary | ICD-10-CM | POA: Diagnosis not present

## 2019-12-22 DIAGNOSIS — Z23 Encounter for immunization: Secondary | ICD-10-CM | POA: Diagnosis not present

## 2019-12-22 DIAGNOSIS — I10 Essential (primary) hypertension: Secondary | ICD-10-CM | POA: Diagnosis not present

## 2020-01-06 DIAGNOSIS — L820 Inflamed seborrheic keratosis: Secondary | ICD-10-CM | POA: Diagnosis not present

## 2020-01-06 DIAGNOSIS — C4441 Basal cell carcinoma of skin of scalp and neck: Secondary | ICD-10-CM | POA: Diagnosis not present

## 2020-01-06 DIAGNOSIS — L570 Actinic keratosis: Secondary | ICD-10-CM | POA: Diagnosis not present

## 2020-01-11 ENCOUNTER — Telehealth: Payer: Self-pay | Admitting: Neurology

## 2020-01-12 ENCOUNTER — Other Ambulatory Visit: Payer: Self-pay | Admitting: Family Medicine

## 2020-01-12 DIAGNOSIS — Z6827 Body mass index (BMI) 27.0-27.9, adult: Secondary | ICD-10-CM | POA: Diagnosis not present

## 2020-01-12 DIAGNOSIS — Z1231 Encounter for screening mammogram for malignant neoplasm of breast: Secondary | ICD-10-CM

## 2020-01-12 DIAGNOSIS — M48061 Spinal stenosis, lumbar region without neurogenic claudication: Secondary | ICD-10-CM | POA: Diagnosis not present

## 2020-01-12 DIAGNOSIS — I10 Essential (primary) hypertension: Secondary | ICD-10-CM | POA: Diagnosis not present

## 2020-01-12 NOTE — Telephone Encounter (Signed)
She may take doxycycline as this is not commonly associated with making myasthenia worse.

## 2020-01-12 NOTE — Telephone Encounter (Signed)
Called and spoke to patient and informed her that she may take doxycycline as it is not commonly associated with worsening myasthenia per Dr. Posey Pronto. Patient verbalized understanding.

## 2020-01-12 NOTE — Telephone Encounter (Signed)
Patient called in and left a message. She is returning our call.

## 2020-01-14 DIAGNOSIS — Z85828 Personal history of other malignant neoplasm of skin: Secondary | ICD-10-CM | POA: Diagnosis not present

## 2020-01-14 DIAGNOSIS — C4441 Basal cell carcinoma of skin of scalp and neck: Secondary | ICD-10-CM | POA: Diagnosis not present

## 2020-01-19 DIAGNOSIS — Z7982 Long term (current) use of aspirin: Secondary | ICD-10-CM | POA: Diagnosis not present

## 2020-01-19 DIAGNOSIS — Z9181 History of falling: Secondary | ICD-10-CM | POA: Diagnosis not present

## 2020-01-19 DIAGNOSIS — Z7952 Long term (current) use of systemic steroids: Secondary | ICD-10-CM | POA: Diagnosis not present

## 2020-01-19 DIAGNOSIS — M48061 Spinal stenosis, lumbar region without neurogenic claudication: Secondary | ICD-10-CM | POA: Diagnosis not present

## 2020-01-19 DIAGNOSIS — M4802 Spinal stenosis, cervical region: Secondary | ICD-10-CM | POA: Diagnosis not present

## 2020-01-19 DIAGNOSIS — Z23 Encounter for immunization: Secondary | ICD-10-CM | POA: Diagnosis not present

## 2020-01-19 DIAGNOSIS — Z4789 Encounter for other orthopedic aftercare: Secondary | ICD-10-CM | POA: Diagnosis not present

## 2020-01-19 DIAGNOSIS — Z981 Arthrodesis status: Secondary | ICD-10-CM | POA: Diagnosis not present

## 2020-01-19 DIAGNOSIS — G7000 Myasthenia gravis without (acute) exacerbation: Secondary | ICD-10-CM | POA: Diagnosis not present

## 2020-01-19 DIAGNOSIS — I10 Essential (primary) hypertension: Secondary | ICD-10-CM | POA: Diagnosis not present

## 2020-01-21 DIAGNOSIS — G7000 Myasthenia gravis without (acute) exacerbation: Secondary | ICD-10-CM | POA: Diagnosis not present

## 2020-01-21 DIAGNOSIS — I10 Essential (primary) hypertension: Secondary | ICD-10-CM | POA: Diagnosis not present

## 2020-01-21 DIAGNOSIS — Z981 Arthrodesis status: Secondary | ICD-10-CM | POA: Diagnosis not present

## 2020-01-21 DIAGNOSIS — M48061 Spinal stenosis, lumbar region without neurogenic claudication: Secondary | ICD-10-CM | POA: Diagnosis not present

## 2020-01-21 DIAGNOSIS — Z7982 Long term (current) use of aspirin: Secondary | ICD-10-CM | POA: Diagnosis not present

## 2020-01-21 DIAGNOSIS — Z4789 Encounter for other orthopedic aftercare: Secondary | ICD-10-CM | POA: Diagnosis not present

## 2020-01-22 ENCOUNTER — Other Ambulatory Visit: Payer: Self-pay | Admitting: Neurology

## 2020-01-25 DIAGNOSIS — Z981 Arthrodesis status: Secondary | ICD-10-CM | POA: Diagnosis not present

## 2020-01-25 DIAGNOSIS — I10 Essential (primary) hypertension: Secondary | ICD-10-CM | POA: Diagnosis not present

## 2020-01-25 DIAGNOSIS — Z4789 Encounter for other orthopedic aftercare: Secondary | ICD-10-CM | POA: Diagnosis not present

## 2020-01-25 DIAGNOSIS — G7000 Myasthenia gravis without (acute) exacerbation: Secondary | ICD-10-CM | POA: Diagnosis not present

## 2020-01-25 DIAGNOSIS — M48061 Spinal stenosis, lumbar region without neurogenic claudication: Secondary | ICD-10-CM | POA: Diagnosis not present

## 2020-01-25 DIAGNOSIS — Z7982 Long term (current) use of aspirin: Secondary | ICD-10-CM | POA: Diagnosis not present

## 2020-01-27 DIAGNOSIS — Z7982 Long term (current) use of aspirin: Secondary | ICD-10-CM | POA: Diagnosis not present

## 2020-01-27 DIAGNOSIS — Z981 Arthrodesis status: Secondary | ICD-10-CM | POA: Diagnosis not present

## 2020-01-27 DIAGNOSIS — Z4789 Encounter for other orthopedic aftercare: Secondary | ICD-10-CM | POA: Diagnosis not present

## 2020-01-27 DIAGNOSIS — I10 Essential (primary) hypertension: Secondary | ICD-10-CM | POA: Diagnosis not present

## 2020-01-27 DIAGNOSIS — G7000 Myasthenia gravis without (acute) exacerbation: Secondary | ICD-10-CM | POA: Diagnosis not present

## 2020-01-27 DIAGNOSIS — M48061 Spinal stenosis, lumbar region without neurogenic claudication: Secondary | ICD-10-CM | POA: Diagnosis not present

## 2020-02-01 DIAGNOSIS — M48061 Spinal stenosis, lumbar region without neurogenic claudication: Secondary | ICD-10-CM | POA: Diagnosis not present

## 2020-02-01 DIAGNOSIS — Z7982 Long term (current) use of aspirin: Secondary | ICD-10-CM | POA: Diagnosis not present

## 2020-02-01 DIAGNOSIS — G7000 Myasthenia gravis without (acute) exacerbation: Secondary | ICD-10-CM | POA: Diagnosis not present

## 2020-02-01 DIAGNOSIS — I10 Essential (primary) hypertension: Secondary | ICD-10-CM | POA: Diagnosis not present

## 2020-02-01 DIAGNOSIS — Z4789 Encounter for other orthopedic aftercare: Secondary | ICD-10-CM | POA: Diagnosis not present

## 2020-02-01 DIAGNOSIS — Z981 Arthrodesis status: Secondary | ICD-10-CM | POA: Diagnosis not present

## 2020-02-05 DIAGNOSIS — I10 Essential (primary) hypertension: Secondary | ICD-10-CM | POA: Diagnosis not present

## 2020-02-05 DIAGNOSIS — G7000 Myasthenia gravis without (acute) exacerbation: Secondary | ICD-10-CM | POA: Diagnosis not present

## 2020-02-05 DIAGNOSIS — Z4789 Encounter for other orthopedic aftercare: Secondary | ICD-10-CM | POA: Diagnosis not present

## 2020-02-05 DIAGNOSIS — Z7982 Long term (current) use of aspirin: Secondary | ICD-10-CM | POA: Diagnosis not present

## 2020-02-05 DIAGNOSIS — Z981 Arthrodesis status: Secondary | ICD-10-CM | POA: Diagnosis not present

## 2020-02-05 DIAGNOSIS — M48061 Spinal stenosis, lumbar region without neurogenic claudication: Secondary | ICD-10-CM | POA: Diagnosis not present

## 2020-02-08 DIAGNOSIS — Z4789 Encounter for other orthopedic aftercare: Secondary | ICD-10-CM | POA: Diagnosis not present

## 2020-02-08 DIAGNOSIS — G7000 Myasthenia gravis without (acute) exacerbation: Secondary | ICD-10-CM | POA: Diagnosis not present

## 2020-02-08 DIAGNOSIS — Z981 Arthrodesis status: Secondary | ICD-10-CM | POA: Diagnosis not present

## 2020-02-08 DIAGNOSIS — Z7982 Long term (current) use of aspirin: Secondary | ICD-10-CM | POA: Diagnosis not present

## 2020-02-08 DIAGNOSIS — I10 Essential (primary) hypertension: Secondary | ICD-10-CM | POA: Diagnosis not present

## 2020-02-08 DIAGNOSIS — M48061 Spinal stenosis, lumbar region without neurogenic claudication: Secondary | ICD-10-CM | POA: Diagnosis not present

## 2020-02-15 ENCOUNTER — Ambulatory Visit: Payer: Medicare Other | Admitting: Neurology

## 2020-02-15 DIAGNOSIS — I10 Essential (primary) hypertension: Secondary | ICD-10-CM | POA: Diagnosis not present

## 2020-02-15 DIAGNOSIS — Z7982 Long term (current) use of aspirin: Secondary | ICD-10-CM | POA: Diagnosis not present

## 2020-02-15 DIAGNOSIS — G7000 Myasthenia gravis without (acute) exacerbation: Secondary | ICD-10-CM | POA: Diagnosis not present

## 2020-02-15 DIAGNOSIS — Z4789 Encounter for other orthopedic aftercare: Secondary | ICD-10-CM | POA: Diagnosis not present

## 2020-02-15 DIAGNOSIS — M48061 Spinal stenosis, lumbar region without neurogenic claudication: Secondary | ICD-10-CM | POA: Diagnosis not present

## 2020-02-15 DIAGNOSIS — Z981 Arthrodesis status: Secondary | ICD-10-CM | POA: Diagnosis not present

## 2020-02-17 ENCOUNTER — Other Ambulatory Visit: Payer: Self-pay | Admitting: Neurology

## 2020-02-18 ENCOUNTER — Encounter: Payer: Self-pay | Admitting: Neurology

## 2020-02-18 ENCOUNTER — Other Ambulatory Visit: Payer: Self-pay

## 2020-02-18 ENCOUNTER — Ambulatory Visit (INDEPENDENT_AMBULATORY_CARE_PROVIDER_SITE_OTHER): Payer: Medicare Other | Admitting: Neurology

## 2020-02-18 VITALS — BP 145/74 | HR 83 | Ht 65.0 in | Wt 143.0 lb

## 2020-02-18 DIAGNOSIS — Z981 Arthrodesis status: Secondary | ICD-10-CM | POA: Diagnosis not present

## 2020-02-18 DIAGNOSIS — Z7952 Long term (current) use of systemic steroids: Secondary | ICD-10-CM | POA: Diagnosis not present

## 2020-02-18 DIAGNOSIS — Z7982 Long term (current) use of aspirin: Secondary | ICD-10-CM | POA: Diagnosis not present

## 2020-02-18 DIAGNOSIS — M4716 Other spondylosis with myelopathy, lumbar region: Secondary | ICD-10-CM

## 2020-02-18 DIAGNOSIS — E538 Deficiency of other specified B group vitamins: Secondary | ICD-10-CM

## 2020-02-18 DIAGNOSIS — Z4789 Encounter for other orthopedic aftercare: Secondary | ICD-10-CM | POA: Diagnosis not present

## 2020-02-18 DIAGNOSIS — M48061 Spinal stenosis, lumbar region without neurogenic claudication: Secondary | ICD-10-CM | POA: Diagnosis not present

## 2020-02-18 DIAGNOSIS — Z9181 History of falling: Secondary | ICD-10-CM | POA: Diagnosis not present

## 2020-02-18 DIAGNOSIS — G7000 Myasthenia gravis without (acute) exacerbation: Secondary | ICD-10-CM | POA: Diagnosis not present

## 2020-02-18 DIAGNOSIS — I10 Essential (primary) hypertension: Secondary | ICD-10-CM | POA: Diagnosis not present

## 2020-02-18 MED ORDER — PREDNISONE 5 MG PO TABS: ORAL_TABLET | ORAL | 3 refills | Status: DC

## 2020-02-18 NOTE — Patient Instructions (Signed)
Reduce prednisone to 15mg  daily Continue IVIG for one more session Continue mestinon 60mg  three times daily  Return to clinic in 3 months

## 2020-02-18 NOTE — Progress Notes (Signed)
Follow-up Visit   Date: 02/18/20   Audrey Cowan MRN: [id number] DOB: December 09, 1940   Interim History: Audrey Cowan is a 79 y.o. right-handed Caucasian female with hypothyroidism, hyperlipidemia, anxiety/CKD stage3, hypertension, and vitamin B12 deficiency returning to the clinic for follow-up of seropositive generalized myasthenia gravis.  The patient was accompanied to the clinic by son.  UPDATE 02/18/2020:  She is here for follow-up visit.  With respect to myasthenia, she is doing quite well. She denies double vision, droopy eyelids, difficulty swallow/talking. Her last IVIG was in October and she is scheduled again in December after which it will be stopped.  She remains on prednisone 20mg  and mestinon 60mg  three times daily and tolerates these well. She underwent lumbar decompression and is frustrated at the slow improvement in overall fatigue.  Medications:  Current Outpatient Medications on File Prior to Visit  Medication Sig Dispense Refill  . ALPRAZolam (XANAX) 0.25 MG tablet Take 0.25 mg by mouth at bedtime.     Marland Kitchen amLODipine (NORVASC) 10 MG tablet Take 1 tablet (10 mg total) by mouth daily. 30 tablet 1  . aspirin EC 81 MG tablet Take 81 mg by mouth every other day.    Marland Kitchen atorvastatin (LIPITOR) 20 MG tablet Take 20 mg by mouth at bedtime.     . Cholecalciferol (VITAMIN D) 50 MCG (2000 UT) tablet Take 2,000 Units by mouth at bedtime.    . furosemide (LASIX) 20 MG tablet Take 10 mg by mouth every other day.     Marland Kitchen GAMMAPLEX 10 GM/100ML SOLN Inject into the vein every 21 ( twenty-one) days.     Marland Kitchen levothyroxine (SYNTHROID) 88 MCG tablet Take 88 mcg by mouth daily before breakfast.    . oxyCODONE (OXY IR/ROXICODONE) 5 MG immediate release tablet Take 1 tablet (5 mg total) by mouth every 6 (six) hours as needed for moderate pain ((score 4 to 6)). 30 tablet 0  . pantoprazole (PROTONIX) 40 MG tablet Take 1 tablet (40 mg total) by mouth daily. 30 tablet 1  . predniSONE (DELTASONE)  20 MG tablet TAKE 1 TABLET BY MOUTH EVERY DAY WITH BREAKFAST 30 tablet 0  . pyridostigmine (MESTINON) 60 MG tablet TAKE 1 TABLET BY MOUTH AT 7AM, NOON AND 5PM 90 tablet 0  . vitamin B-12 (CYANOCOBALAMIN) 1000 MCG tablet Take 1,000 mcg by mouth daily with lunch.      No current facility-administered medications on file prior to visit.    Allergies:  Allergies  Allergen Reactions  . Other     Pt has Myasthenia gravis so all anesthesia must be pre-approved    Vital Signs:  BP (!) 145/74   Pulse 83   Ht 5\' 5"  (1.651 m)   Wt 143 lb (64.9 kg)   SpO2 95%   BMI 23.80 kg/m    Neurological Exam: MENTAL STATUS including orientation to time, place, person, recent and remote memory, attention span and concentration, language, and fund of knowledge is normal.  Speech is clear, no dysarthria   CRANIAL NERVES:   Pupils equal round and reactive to light.  Normal conjugate, extra-ocular eye movements in all directions of gaze.  No ptosis at baseline or with sustained upgaze.  Oribicular oculi, buccinator, and orbicularis oris 5/5.   Tongue strength 5/5  MOTOR:  Motor strength is 5/5 in the arms and legs, except hip flexion 5-/5 bilaterally.  No fatigability.  No pronator drift.  Tone is increased in the legs  MSRs:  Right        Left brachioradialis 2+  2+  biceps 2+  2+  triceps 2+  2+  patellar 3+  3+  ankle jerk 3+  3+   COORDINATION/GAIT:  Normal finger-to- nose-finger.  Gait is assisted with a walker, mildly spastic appearing.   Data: Labs 06/23/2019:  AChR binding 57.1*, blocking 57*, modulating 59*  IMPRESSION/PLAN: 1. Seropositive generalized myasthenia gravis without exacerbation (dx 06/2019), thymoma negative.  No evidence of weakness on exam today.  Hospitalized with initial diagnosis and treated with IVIG, prednisone 60mg , and mestinon 60mg  QID. Since April, she has been doing great on IVIG 1mg /kg every 3 weeks, prednisone 20mg , and  mestinon 60mg  TID.  She has been doing well on IVIG taper, with last infusion in October.  I will begin to taper her prednisone to 15mg  daily.  While doing so, she will get one more IVIG infusion at the end of the month, then stop.   - Continue IVIG 1g/kg x 1 then stop  - Reduce prednisone 15mg  daily  - Continue mestinon 60mg  three times daily (7am, noon, and 5pm)  2. 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    - Continue vitamin B12 1019mcg daily  3. 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  Leg strength is improved  Return to clinic in 3 months   Total time spent reviewing records, interview, history/exam, documentation, and coordination of care on day of encounter:  35 min    Thank you for allowing me to participate in patient's care.  If I can answer any additional questions, I would be pleased to do so.    Sincerely,    Allyn Bertoni K. Posey Pronto, DO

## 2020-02-24 DIAGNOSIS — Z7982 Long term (current) use of aspirin: Secondary | ICD-10-CM | POA: Diagnosis not present

## 2020-02-24 DIAGNOSIS — Z4789 Encounter for other orthopedic aftercare: Secondary | ICD-10-CM | POA: Diagnosis not present

## 2020-02-24 DIAGNOSIS — M48061 Spinal stenosis, lumbar region without neurogenic claudication: Secondary | ICD-10-CM | POA: Diagnosis not present

## 2020-02-24 DIAGNOSIS — Z981 Arthrodesis status: Secondary | ICD-10-CM | POA: Diagnosis not present

## 2020-02-24 DIAGNOSIS — G7000 Myasthenia gravis without (acute) exacerbation: Secondary | ICD-10-CM | POA: Diagnosis not present

## 2020-02-24 DIAGNOSIS — I10 Essential (primary) hypertension: Secondary | ICD-10-CM | POA: Diagnosis not present

## 2020-02-29 DIAGNOSIS — M48061 Spinal stenosis, lumbar region without neurogenic claudication: Secondary | ICD-10-CM | POA: Diagnosis not present

## 2020-02-29 DIAGNOSIS — I10 Essential (primary) hypertension: Secondary | ICD-10-CM | POA: Diagnosis not present

## 2020-02-29 DIAGNOSIS — G7000 Myasthenia gravis without (acute) exacerbation: Secondary | ICD-10-CM | POA: Diagnosis not present

## 2020-02-29 DIAGNOSIS — Z981 Arthrodesis status: Secondary | ICD-10-CM | POA: Diagnosis not present

## 2020-02-29 DIAGNOSIS — Z7982 Long term (current) use of aspirin: Secondary | ICD-10-CM | POA: Diagnosis not present

## 2020-02-29 DIAGNOSIS — Z4789 Encounter for other orthopedic aftercare: Secondary | ICD-10-CM | POA: Diagnosis not present

## 2020-03-01 ENCOUNTER — Other Ambulatory Visit: Payer: Self-pay | Admitting: Neurology

## 2020-03-07 ENCOUNTER — Other Ambulatory Visit: Payer: Self-pay

## 2020-03-07 ENCOUNTER — Ambulatory Visit
Admission: RE | Admit: 2020-03-07 | Discharge: 2020-03-07 | Disposition: A | Discharge status: Home / Self Care | Payer: Medicare Other | Source: Ambulatory Visit | Attending: Family Medicine | Admitting: Family Medicine

## 2020-03-07 DIAGNOSIS — Z1231 Encounter for screening mammogram for malignant neoplasm of breast: Secondary | ICD-10-CM | POA: Diagnosis not present

## 2020-03-07 IMAGING — MG DIGITAL SCREENING BILAT W/ TOMO W/ CAD
8 series · 8 of 24 positions shown · non-contrast
Comparison: Previous exam(s).

CLINICAL DATA: Screening.

EXAM:
DIGITAL SCREENING BILATERAL MAMMOGRAM WITH TOMO AND CAD

[L CC synth-2D]
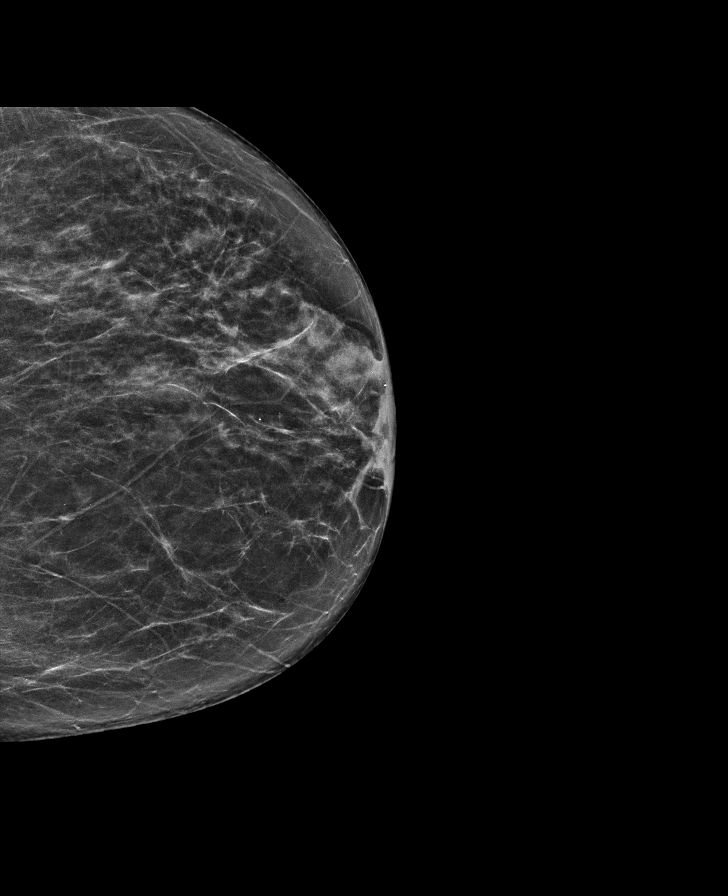

[R CC synth-2D]
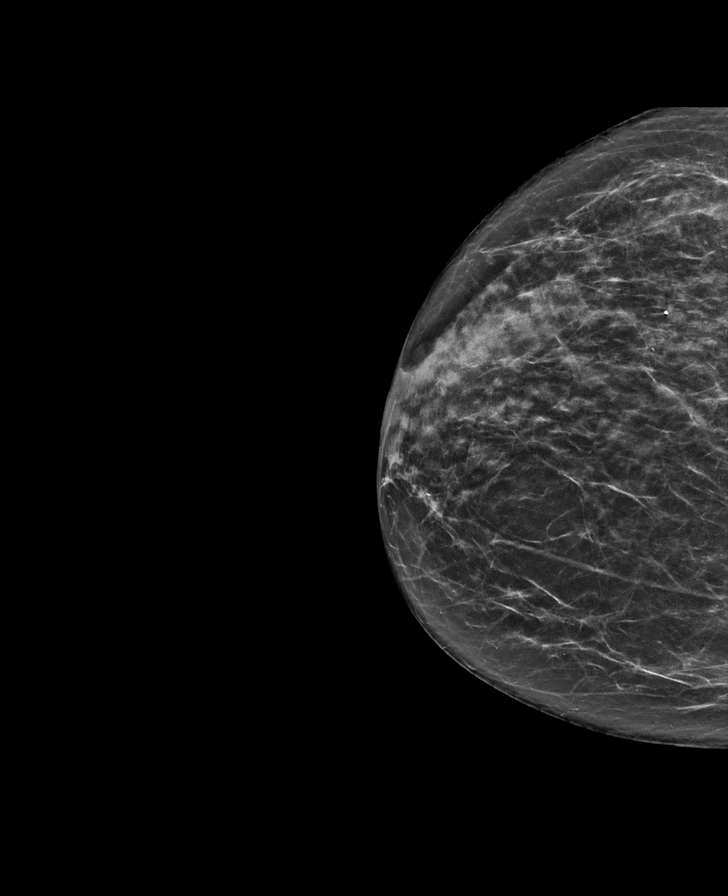

[L MLO synth-2D]
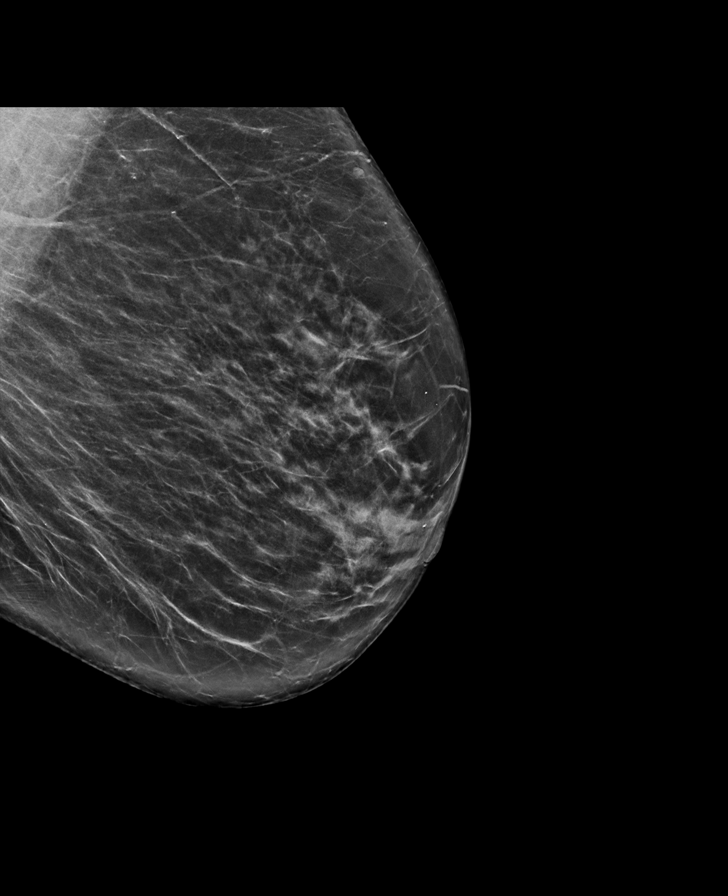

[R MLO synth-2D]
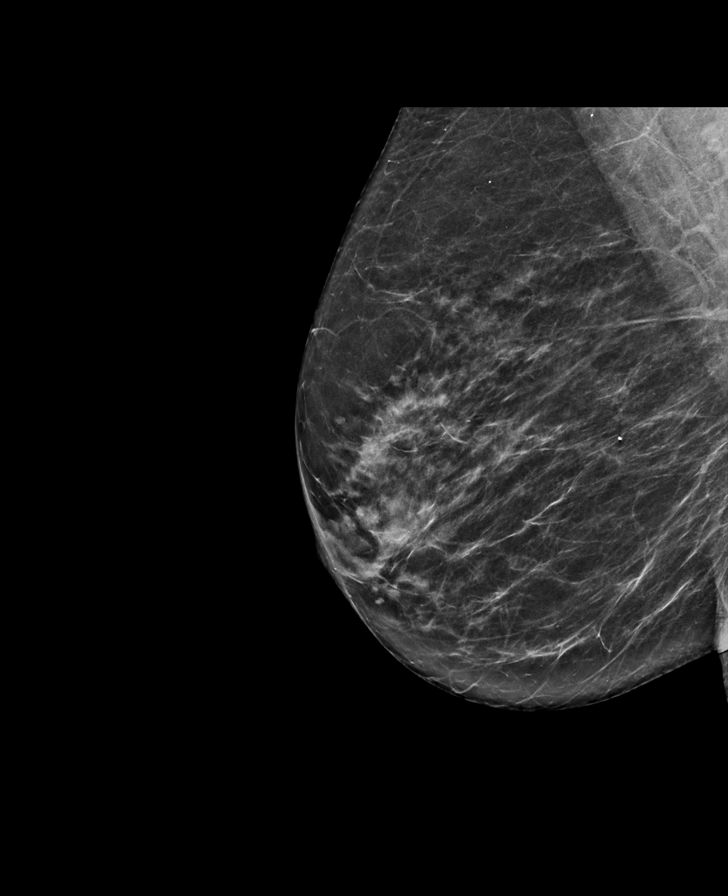

[R MLO tomo · tomo slice 35/70.0]
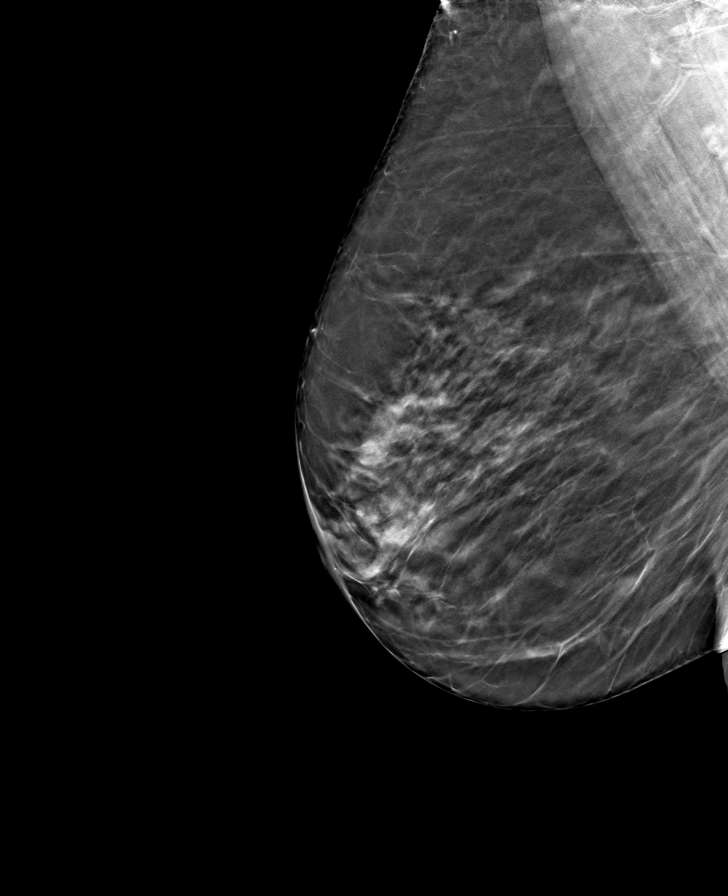

[L MLO tomo · tomo slice 37/74.0]
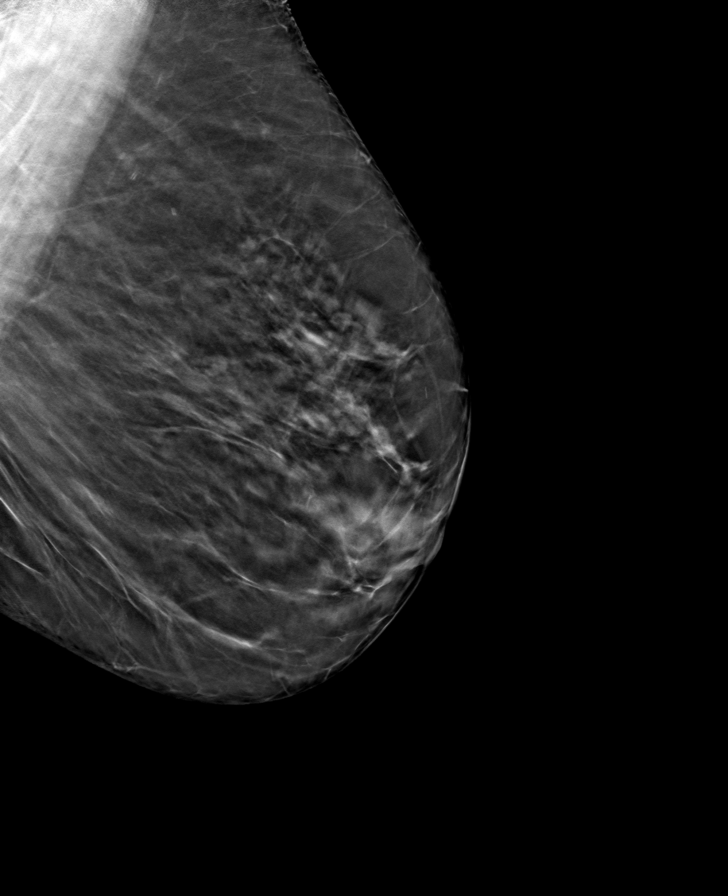

[R CC tomo · tomo slice 31/62.0]
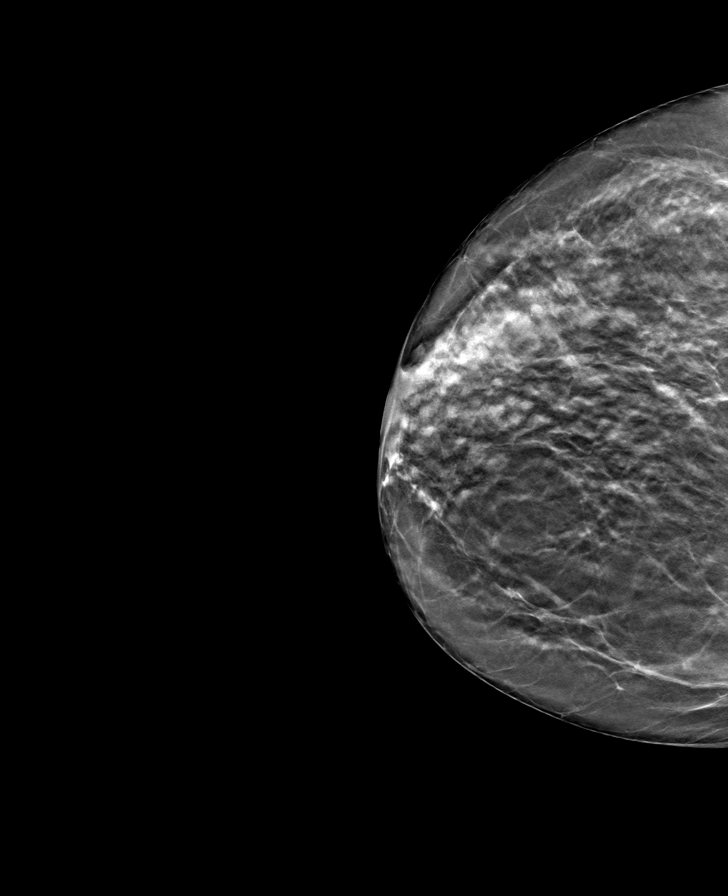

[L CC tomo · tomo slice 33/65.0]
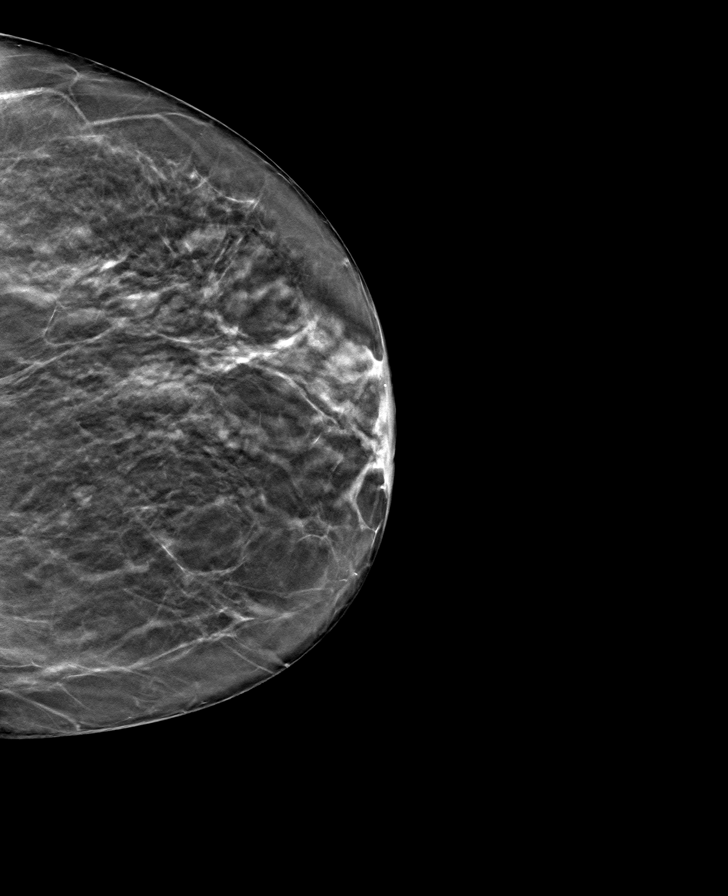

[8 of 24 positions shown; findings below may reference images not displayed]

ACR Breast Density Category b: There are scattered areas of
fibroglandular density.
FINDINGS: There are no findings suspicious for malignancy. Images were
processed with CAD.
IMPRESSION: No mammographic evidence of malignancy. A result letter of this
screening mammogram will be mailed directly to the patient.

RECOMMENDATION:
Screening mammogram in one year. (Code:[TQ])

BI-RADS CATEGORY  1: Negative.

## 2020-03-09 DIAGNOSIS — G7000 Myasthenia gravis without (acute) exacerbation: Secondary | ICD-10-CM | POA: Diagnosis not present

## 2020-03-09 DIAGNOSIS — M48061 Spinal stenosis, lumbar region without neurogenic claudication: Secondary | ICD-10-CM | POA: Diagnosis not present

## 2020-03-09 DIAGNOSIS — Z7982 Long term (current) use of aspirin: Secondary | ICD-10-CM | POA: Diagnosis not present

## 2020-03-09 DIAGNOSIS — I10 Essential (primary) hypertension: Secondary | ICD-10-CM | POA: Diagnosis not present

## 2020-03-09 DIAGNOSIS — Z981 Arthrodesis status: Secondary | ICD-10-CM | POA: Diagnosis not present

## 2020-03-09 DIAGNOSIS — Z4789 Encounter for other orthopedic aftercare: Secondary | ICD-10-CM | POA: Diagnosis not present

## 2020-04-03 ENCOUNTER — Other Ambulatory Visit: Payer: Self-pay | Admitting: Neurology

## 2020-04-12 DIAGNOSIS — Z6829 Body mass index (BMI) 29.0-29.9, adult: Secondary | ICD-10-CM | POA: Insufficient documentation

## 2020-04-21 DIAGNOSIS — R262 Difficulty in walking, not elsewhere classified: Secondary | ICD-10-CM | POA: Diagnosis not present

## 2020-04-21 DIAGNOSIS — M6281 Muscle weakness (generalized): Secondary | ICD-10-CM | POA: Diagnosis not present

## 2020-04-21 DIAGNOSIS — G7000 Myasthenia gravis without (acute) exacerbation: Secondary | ICD-10-CM | POA: Diagnosis not present

## 2020-04-27 DIAGNOSIS — R262 Difficulty in walking, not elsewhere classified: Secondary | ICD-10-CM | POA: Diagnosis not present

## 2020-04-27 DIAGNOSIS — G7000 Myasthenia gravis without (acute) exacerbation: Secondary | ICD-10-CM | POA: Diagnosis not present

## 2020-04-27 DIAGNOSIS — M6281 Muscle weakness (generalized): Secondary | ICD-10-CM | POA: Diagnosis not present

## 2020-04-28 ENCOUNTER — Other Ambulatory Visit: Payer: Self-pay | Admitting: Neurology

## 2020-05-03 DIAGNOSIS — M6281 Muscle weakness (generalized): Secondary | ICD-10-CM | POA: Diagnosis not present

## 2020-05-03 DIAGNOSIS — G7000 Myasthenia gravis without (acute) exacerbation: Secondary | ICD-10-CM | POA: Diagnosis not present

## 2020-05-03 DIAGNOSIS — R262 Difficulty in walking, not elsewhere classified: Secondary | ICD-10-CM | POA: Diagnosis not present

## 2020-05-04 DIAGNOSIS — M6281 Muscle weakness (generalized): Secondary | ICD-10-CM | POA: Diagnosis not present

## 2020-05-04 DIAGNOSIS — G7000 Myasthenia gravis without (acute) exacerbation: Secondary | ICD-10-CM | POA: Diagnosis not present

## 2020-05-04 DIAGNOSIS — R262 Difficulty in walking, not elsewhere classified: Secondary | ICD-10-CM | POA: Diagnosis not present

## 2020-05-11 DIAGNOSIS — R262 Difficulty in walking, not elsewhere classified: Secondary | ICD-10-CM | POA: Diagnosis not present

## 2020-05-11 DIAGNOSIS — G7000 Myasthenia gravis without (acute) exacerbation: Secondary | ICD-10-CM | POA: Diagnosis not present

## 2020-05-11 DIAGNOSIS — M6281 Muscle weakness (generalized): Secondary | ICD-10-CM | POA: Diagnosis not present

## 2020-05-13 DIAGNOSIS — R262 Difficulty in walking, not elsewhere classified: Secondary | ICD-10-CM | POA: Diagnosis not present

## 2020-05-13 DIAGNOSIS — G7000 Myasthenia gravis without (acute) exacerbation: Secondary | ICD-10-CM | POA: Diagnosis not present

## 2020-05-13 DIAGNOSIS — M6281 Muscle weakness (generalized): Secondary | ICD-10-CM | POA: Diagnosis not present

## 2020-05-17 DIAGNOSIS — R262 Difficulty in walking, not elsewhere classified: Secondary | ICD-10-CM | POA: Diagnosis not present

## 2020-05-17 DIAGNOSIS — G7000 Myasthenia gravis without (acute) exacerbation: Secondary | ICD-10-CM | POA: Diagnosis not present

## 2020-05-17 DIAGNOSIS — M6281 Muscle weakness (generalized): Secondary | ICD-10-CM | POA: Diagnosis not present

## 2020-05-25 ENCOUNTER — Other Ambulatory Visit: Payer: Self-pay | Admitting: Neurology

## 2020-05-27 ENCOUNTER — Other Ambulatory Visit: Payer: Self-pay

## 2020-05-27 ENCOUNTER — Ambulatory Visit (INDEPENDENT_AMBULATORY_CARE_PROVIDER_SITE_OTHER): Payer: Medicare Other | Admitting: Neurology

## 2020-05-27 ENCOUNTER — Encounter: Payer: Self-pay | Admitting: Neurology

## 2020-05-27 VITALS — BP 156/88 | HR 96 | Ht 65.0 in | Wt 156.0 lb

## 2020-05-27 DIAGNOSIS — G7000 Myasthenia gravis without (acute) exacerbation: Secondary | ICD-10-CM | POA: Diagnosis not present

## 2020-05-27 DIAGNOSIS — Z79899 Other long term (current) drug therapy: Secondary | ICD-10-CM

## 2020-05-27 MED ORDER — PREDNISONE 5 MG PO TABS: ORAL_TABLET | ORAL | 3 refills | Status: DC

## 2020-05-27 NOTE — Progress Notes (Signed)
Follow-up Visit   Date: 05/27/20   KHARMA SAMPSEL MRN: [id number] DOB: 10/14/1940   Interim History: Audrey Cowan is a 80 y.o. right-handed Caucasian female with hypothyroidism, hyperlipidemia, anxiety/CKD stage3, hypertension, and vitamin B12 deficiency returning to the clinic for follow-up of seropositive generalized myasthenia gravis.  The patient was accompanied to the clinic by son.  She is on prednisone 15mg  daily and denies breakthrough MG symptoms (double vision, difficulty swallowing/talking, weakness). She has been off IVIG since December.  She complains of generalized fatigue, which has been ongoing.  She reduced B12 to twice per week, since her level in September where improved. She has not had this rechecked.   She continues to have numbness in the feet and lower legs. Sometimes her legs feel swollen.  She suffered a fall a few weeks ago when using the walker and injured her right leg.   Medications:  Current Outpatient Medications on File Prior to Visit  Medication Sig Dispense Refill  . ALPRAZolam (XANAX) 0.25 MG tablet Take 0.25 mg by mouth at bedtime.     Marland Kitchen amLODipine (NORVASC) 10 MG tablet Take 1 tablet (10 mg total) by mouth daily. 30 tablet 1  . aspirin EC 81 MG tablet Take 81 mg by mouth every other day.    Marland Kitchen atorvastatin (LIPITOR) 20 MG tablet Take 20 mg by mouth at bedtime.     . Cholecalciferol (VITAMIN D) 50 MCG (2000 UT) tablet Take 2,000 Units by mouth at bedtime.    . furosemide (LASIX) 20 MG tablet Take 10 mg by mouth every other day.     Marland Kitchen GAMMAPLEX 10 GM/100ML SOLN Inject into the vein every 21 ( twenty-one) days.     Marland Kitchen levothyroxine (SYNTHROID) 88 MCG tablet Take 88 mcg by mouth daily before breakfast.    . oxyCODONE (OXY IR/ROXICODONE) 5 MG immediate release tablet Take 1 tablet (5 mg total) by mouth every 6 (six) hours as needed for moderate pain ((score 4 to 6)). 30 tablet 0  . pantoprazole (PROTONIX) 40 MG tablet Take 1 tablet (40 mg total) by  mouth daily. 30 tablet 1  . predniSONE (DELTASONE) 5 MG tablet Take prednisone 15mg  daily 150 tablet 3  . pyridostigmine (MESTINON) 60 MG tablet TAKE 1 TABLET BY MOUTH AT 7AM, 1 TABLET AT NOON AND 1 TABLET AT 5PM 90 tablet 0  . vitamin B-12 (CYANOCOBALAMIN) 1000 MCG tablet Take 1,000 mcg by mouth daily with lunch.      No current facility-administered medications on file prior to visit.    Allergies:  Allergies  Allergen Reactions  . Other     Pt has Myasthenia gravis so all anesthesia must be pre-approved    Vital Signs:  BP (!) 156/88   Pulse 96   Ht 5\' 5"  (1.651 m)   Wt 156 lb (70.8 kg)   SpO2 95%   BMI 25.96 kg/m    Neurological Exam: MENTAL STATUS including orientation to time, place, person, recent and remote memory, attention span and concentration, language, and fund of knowledge is normal.  Speech is clear, no dysarthria   CRANIAL NERVES:   Normal conjugate, extra-ocular eye movements in all directions of gaze.  No ptosis at baseline or with sustained upgaze.  Oribicular oculi, buccinator, and orbicularis oris 5/5.   Tongue strength 5/5  MOTOR:  Motor strength is 5/5 in the arms and legs, including hip flexion 5/5 bilaterally.  No fatigability.  No pronator drift.  Tone is mildly in the  legs  MSRs:                                           Right        Left brachioradialis 2+  2+  biceps 2+  2+  triceps 2+  2+  patellar 3+  3+  ankle jerk 3+  3+   COORDINATION/GAIT:  Normal finger-to- nose-finger.  Gait is assisted with a walker, mildly spastic appearing.   Data: Labs 06/23/2019:  AChR binding 57.1*, blocking 57*, modulating 59*  IMPRESSION/PLAN: 1.  Seropositive genearlized myasthenia gravis without exacerbation (dx 06/2019), thymoma negative. No evidence of disease manifestation. Hospitalized with initial diagnosis and treated with IVIG, prednisone 60mg , and mestinon 60mg  QID. Since April, she has been doing great on IVIG 1mg /kg every 3 weeks (stopped in  December), prednisone 20mg , and mestinon 60mg  TID.  She has been doing great on prednisone taper.   - Reduce prednisone to 12.5mg  daily x 1 month, then 10mg  daily  - Continue mestinon 60mg  three times daily (7a, noon, and 5p)  - Counseled pt that her MG is under good control and fatigue alone is not associated with MG  2. 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    - Recheck B12 levels when she sees her PCP early next month, as this may be related to her fatigue  3. 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   Return to clinic in 3 months  Total time spent reviewing records, interview, history/exam, documentation, counseling and coordination of care on day of encounter:  30 min  Thank you for allowing me to participate in patient's care.  If I can answer any additional questions, I would be pleased to do so.    Sincerely,    Marcheta Horsey K. Posey Pronto, DO

## 2020-05-27 NOTE — Patient Instructions (Addendum)
Reduce prednisone to 12.5mg  daily for 1 month, then reduce to 10mg  daily until you see me again  Continue mestinon 60mg  three times daily  Please have you vitamin B12 level check when you see your primary care doctor  Return to clinic in 3 months

## 2020-05-29 ENCOUNTER — Other Ambulatory Visit: Payer: Self-pay | Admitting: Neurology

## 2020-06-21 DIAGNOSIS — E7800 Pure hypercholesterolemia, unspecified: Secondary | ICD-10-CM | POA: Diagnosis not present

## 2020-06-21 DIAGNOSIS — F411 Generalized anxiety disorder: Secondary | ICD-10-CM | POA: Diagnosis not present

## 2020-06-21 DIAGNOSIS — M8580 Other specified disorders of bone density and structure, unspecified site: Secondary | ICD-10-CM | POA: Diagnosis not present

## 2020-06-21 DIAGNOSIS — N1831 Chronic kidney disease, stage 3a: Secondary | ICD-10-CM | POA: Diagnosis not present

## 2020-06-21 DIAGNOSIS — G629 Polyneuropathy, unspecified: Secondary | ICD-10-CM | POA: Diagnosis not present

## 2020-06-21 DIAGNOSIS — G7000 Myasthenia gravis without (acute) exacerbation: Secondary | ICD-10-CM | POA: Diagnosis not present

## 2020-06-21 DIAGNOSIS — I10 Essential (primary) hypertension: Secondary | ICD-10-CM | POA: Diagnosis not present

## 2020-06-21 DIAGNOSIS — E538 Deficiency of other specified B group vitamins: Secondary | ICD-10-CM | POA: Diagnosis not present

## 2020-06-21 DIAGNOSIS — E039 Hypothyroidism, unspecified: Secondary | ICD-10-CM | POA: Diagnosis not present

## 2020-06-21 DIAGNOSIS — M6281 Muscle weakness (generalized): Secondary | ICD-10-CM | POA: Diagnosis not present

## 2020-06-24 ENCOUNTER — Other Ambulatory Visit: Payer: Self-pay | Admitting: Neurology

## 2020-07-04 DIAGNOSIS — G7000 Myasthenia gravis without (acute) exacerbation: Secondary | ICD-10-CM | POA: Diagnosis not present

## 2020-07-04 DIAGNOSIS — R262 Difficulty in walking, not elsewhere classified: Secondary | ICD-10-CM | POA: Diagnosis not present

## 2020-07-04 DIAGNOSIS — M6281 Muscle weakness (generalized): Secondary | ICD-10-CM | POA: Diagnosis not present

## 2020-07-06 DIAGNOSIS — M6281 Muscle weakness (generalized): Secondary | ICD-10-CM | POA: Diagnosis not present

## 2020-07-06 DIAGNOSIS — G7000 Myasthenia gravis without (acute) exacerbation: Secondary | ICD-10-CM | POA: Diagnosis not present

## 2020-07-06 DIAGNOSIS — R262 Difficulty in walking, not elsewhere classified: Secondary | ICD-10-CM | POA: Diagnosis not present

## 2020-07-12 DIAGNOSIS — G7000 Myasthenia gravis without (acute) exacerbation: Secondary | ICD-10-CM | POA: Diagnosis not present

## 2020-07-12 DIAGNOSIS — R262 Difficulty in walking, not elsewhere classified: Secondary | ICD-10-CM | POA: Diagnosis not present

## 2020-07-12 DIAGNOSIS — M6281 Muscle weakness (generalized): Secondary | ICD-10-CM | POA: Diagnosis not present

## 2020-07-15 DIAGNOSIS — R262 Difficulty in walking, not elsewhere classified: Secondary | ICD-10-CM | POA: Diagnosis not present

## 2020-07-15 DIAGNOSIS — G7000 Myasthenia gravis without (acute) exacerbation: Secondary | ICD-10-CM | POA: Diagnosis not present

## 2020-07-15 DIAGNOSIS — M6281 Muscle weakness (generalized): Secondary | ICD-10-CM | POA: Diagnosis not present

## 2020-07-18 DIAGNOSIS — R262 Difficulty in walking, not elsewhere classified: Secondary | ICD-10-CM | POA: Diagnosis not present

## 2020-07-18 DIAGNOSIS — M6281 Muscle weakness (generalized): Secondary | ICD-10-CM | POA: Diagnosis not present

## 2020-07-18 DIAGNOSIS — G7000 Myasthenia gravis without (acute) exacerbation: Secondary | ICD-10-CM | POA: Diagnosis not present

## 2020-07-20 DIAGNOSIS — G7000 Myasthenia gravis without (acute) exacerbation: Secondary | ICD-10-CM | POA: Diagnosis not present

## 2020-07-20 DIAGNOSIS — R262 Difficulty in walking, not elsewhere classified: Secondary | ICD-10-CM | POA: Diagnosis not present

## 2020-07-20 DIAGNOSIS — M6281 Muscle weakness (generalized): Secondary | ICD-10-CM | POA: Diagnosis not present

## 2020-07-22 DIAGNOSIS — Z23 Encounter for immunization: Secondary | ICD-10-CM | POA: Diagnosis not present

## 2020-07-25 DIAGNOSIS — G7000 Myasthenia gravis without (acute) exacerbation: Secondary | ICD-10-CM | POA: Diagnosis not present

## 2020-07-25 DIAGNOSIS — R262 Difficulty in walking, not elsewhere classified: Secondary | ICD-10-CM | POA: Diagnosis not present

## 2020-07-25 DIAGNOSIS — M6281 Muscle weakness (generalized): Secondary | ICD-10-CM | POA: Diagnosis not present

## 2020-07-27 DIAGNOSIS — G7000 Myasthenia gravis without (acute) exacerbation: Secondary | ICD-10-CM | POA: Diagnosis not present

## 2020-07-27 DIAGNOSIS — M6281 Muscle weakness (generalized): Secondary | ICD-10-CM | POA: Diagnosis not present

## 2020-07-27 DIAGNOSIS — R262 Difficulty in walking, not elsewhere classified: Secondary | ICD-10-CM | POA: Diagnosis not present

## 2020-08-01 ENCOUNTER — Other Ambulatory Visit: Payer: Self-pay | Admitting: Neurology

## 2020-08-03 ENCOUNTER — Emergency Department (HOSPITAL_COMMUNITY)
Admission: EM | Admit: 2020-08-03 | Discharge: 2020-08-05 | Disposition: A | Discharge status: Home / Self Care | Payer: Medicare Other | Attending: Emergency Medicine | Admitting: Emergency Medicine

## 2020-08-03 ENCOUNTER — Other Ambulatory Visit: Payer: Self-pay

## 2020-08-03 ENCOUNTER — Emergency Department (HOSPITAL_COMMUNITY): Payer: Medicare Other

## 2020-08-03 DIAGNOSIS — M4316 Spondylolisthesis, lumbar region: Secondary | ICD-10-CM | POA: Diagnosis not present

## 2020-08-03 DIAGNOSIS — I10 Essential (primary) hypertension: Secondary | ICD-10-CM | POA: Diagnosis not present

## 2020-08-03 DIAGNOSIS — Z87891 Personal history of nicotine dependence: Secondary | ICD-10-CM | POA: Diagnosis not present

## 2020-08-03 DIAGNOSIS — I129 Hypertensive chronic kidney disease with stage 1 through stage 4 chronic kidney disease, or unspecified chronic kidney disease: Secondary | ICD-10-CM | POA: Insufficient documentation

## 2020-08-03 DIAGNOSIS — R102 Pelvic and perineal pain: Secondary | ICD-10-CM | POA: Diagnosis not present

## 2020-08-03 DIAGNOSIS — E039 Hypothyroidism, unspecified: Secondary | ICD-10-CM | POA: Insufficient documentation

## 2020-08-03 DIAGNOSIS — Z7982 Long term (current) use of aspirin: Secondary | ICD-10-CM | POA: Diagnosis not present

## 2020-08-03 DIAGNOSIS — Z79899 Other long term (current) drug therapy: Secondary | ICD-10-CM | POA: Insufficient documentation

## 2020-08-03 DIAGNOSIS — M79652 Pain in left thigh: Secondary | ICD-10-CM | POA: Diagnosis not present

## 2020-08-03 DIAGNOSIS — R52 Pain, unspecified: Secondary | ICD-10-CM | POA: Diagnosis not present

## 2020-08-03 DIAGNOSIS — Z20822 Contact with and (suspected) exposure to covid-19: Secondary | ICD-10-CM | POA: Diagnosis not present

## 2020-08-03 DIAGNOSIS — R001 Bradycardia, unspecified: Secondary | ICD-10-CM | POA: Diagnosis not present

## 2020-08-03 DIAGNOSIS — M810 Age-related osteoporosis without current pathological fracture: Secondary | ICD-10-CM | POA: Diagnosis not present

## 2020-08-03 DIAGNOSIS — G7000 Myasthenia gravis without (acute) exacerbation: Secondary | ICD-10-CM | POA: Insufficient documentation

## 2020-08-03 DIAGNOSIS — R2681 Unsteadiness on feet: Secondary | ICD-10-CM | POA: Insufficient documentation

## 2020-08-03 DIAGNOSIS — W19XXXA Unspecified fall, initial encounter: Secondary | ICD-10-CM

## 2020-08-03 DIAGNOSIS — M5137 Other intervertebral disc degeneration, lumbosacral region: Secondary | ICD-10-CM | POA: Diagnosis not present

## 2020-08-03 DIAGNOSIS — N183 Chronic kidney disease, stage 3 unspecified: Secondary | ICD-10-CM | POA: Insufficient documentation

## 2020-08-03 DIAGNOSIS — M25552 Pain in left hip: Secondary | ICD-10-CM | POA: Diagnosis not present

## 2020-08-03 DIAGNOSIS — R2689 Other abnormalities of gait and mobility: Secondary | ICD-10-CM | POA: Diagnosis not present

## 2020-08-03 LAB — CBC WITH DIFFERENTIAL/PLATELET
Abs Immature Granulocytes: 0.22 10*3/uL — ABNORMAL HIGH (ref 0.00–0.07)
Basophils Absolute: 0.1 10*3/uL (ref 0.0–0.1)
Basophils Relative: 0 %
Eosinophils Absolute: 0.1 10*3/uL (ref 0.0–0.5)
Eosinophils Relative: 1 %
HCT: 43.6 % (ref 36.0–46.0)
Hemoglobin: 14.3 g/dL (ref 12.0–15.0)
Immature Granulocytes: 2 %
Lymphocytes Relative: 20 %
Lymphs Abs: 2.8 10*3/uL (ref 0.7–4.0)
MCH: 29.8 pg (ref 26.0–34.0)
MCHC: 32.8 g/dL (ref 30.0–36.0)
MCV: 90.8 fL (ref 80.0–100.0)
Monocytes Absolute: 1.2 10*3/uL — ABNORMAL HIGH (ref 0.1–1.0)
Monocytes Relative: 8 %
Neutro Abs: 9.9 10*3/uL — ABNORMAL HIGH (ref 1.7–7.7)
Neutrophils Relative %: 69 %
Platelets: 254 10*3/uL (ref 150–400)
RBC: 4.8 MIL/uL (ref 3.87–5.11)
RDW: 14.1 % (ref 11.5–15.5)
WBC: 14.3 10*3/uL — ABNORMAL HIGH (ref 4.0–10.5)
nRBC: 0 % (ref 0.0–0.2)

## 2020-08-03 LAB — RESP PANEL BY RT-PCR (FLU A&B, COVID) ARPGX2
Influenza A by PCR: NEGATIVE
Influenza B by PCR: NEGATIVE
SARS Coronavirus 2 by RT PCR: NEGATIVE

## 2020-08-03 LAB — URINALYSIS, ROUTINE W REFLEX MICROSCOPIC
Bilirubin Urine: NEGATIVE
Glucose, UA: NEGATIVE mg/dL
Hgb urine dipstick: NEGATIVE
Ketones, ur: NEGATIVE mg/dL
Leukocytes,Ua: NEGATIVE
Nitrite: NEGATIVE
Protein, ur: NEGATIVE mg/dL
Specific Gravity, Urine: 1.015 (ref 1.005–1.030)
pH: 9 — ABNORMAL HIGH (ref 5.0–8.0)

## 2020-08-03 LAB — BASIC METABOLIC PANEL
Anion gap: 10 (ref 5–15)
BUN: 15 mg/dL (ref 8–23)
CO2: 26 mmol/L (ref 22–32)
Calcium: 9.5 mg/dL (ref 8.9–10.3)
Chloride: 105 mmol/L (ref 98–111)
Creatinine, Ser: 1.05 mg/dL — ABNORMAL HIGH (ref 0.44–1.00)
GFR, Estimated: 54 mL/min — ABNORMAL LOW (ref >60)
Glucose, Bld: 99 mg/dL (ref 70–99)
Potassium: 3.6 mmol/L (ref 3.5–5.1)
Sodium: 141 mmol/L (ref 135–145)

## 2020-08-03 IMAGING — DX DG HIP (WITH OR WITHOUT PELVIS) 2-3V*L*
3 series · 3 of 3 positions shown · non-contrast
Comparison: None.

CLINICAL DATA: Left hip pain after fall several days ago.

EXAM:
DG HIP (WITH OR WITHOUT PELVIS) 2-3V LEFT

[pelvis ap]
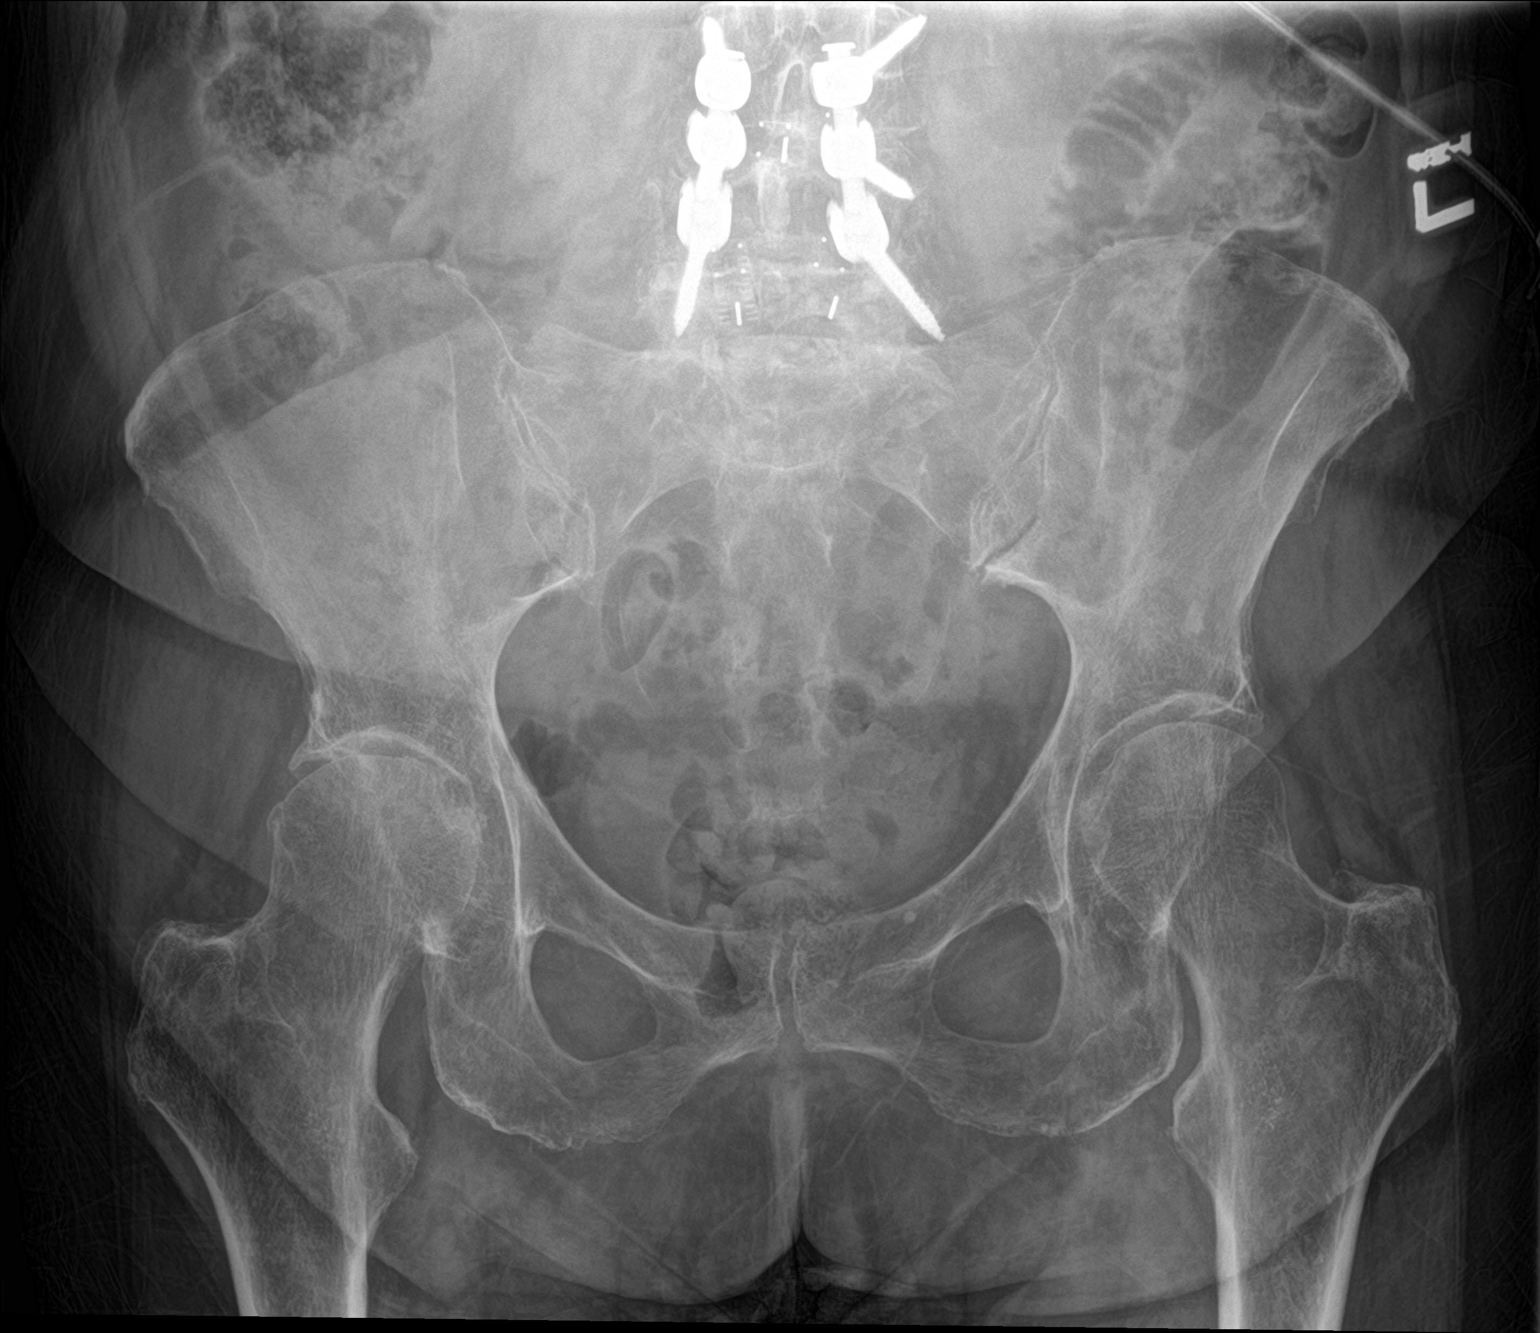

[hip ap]
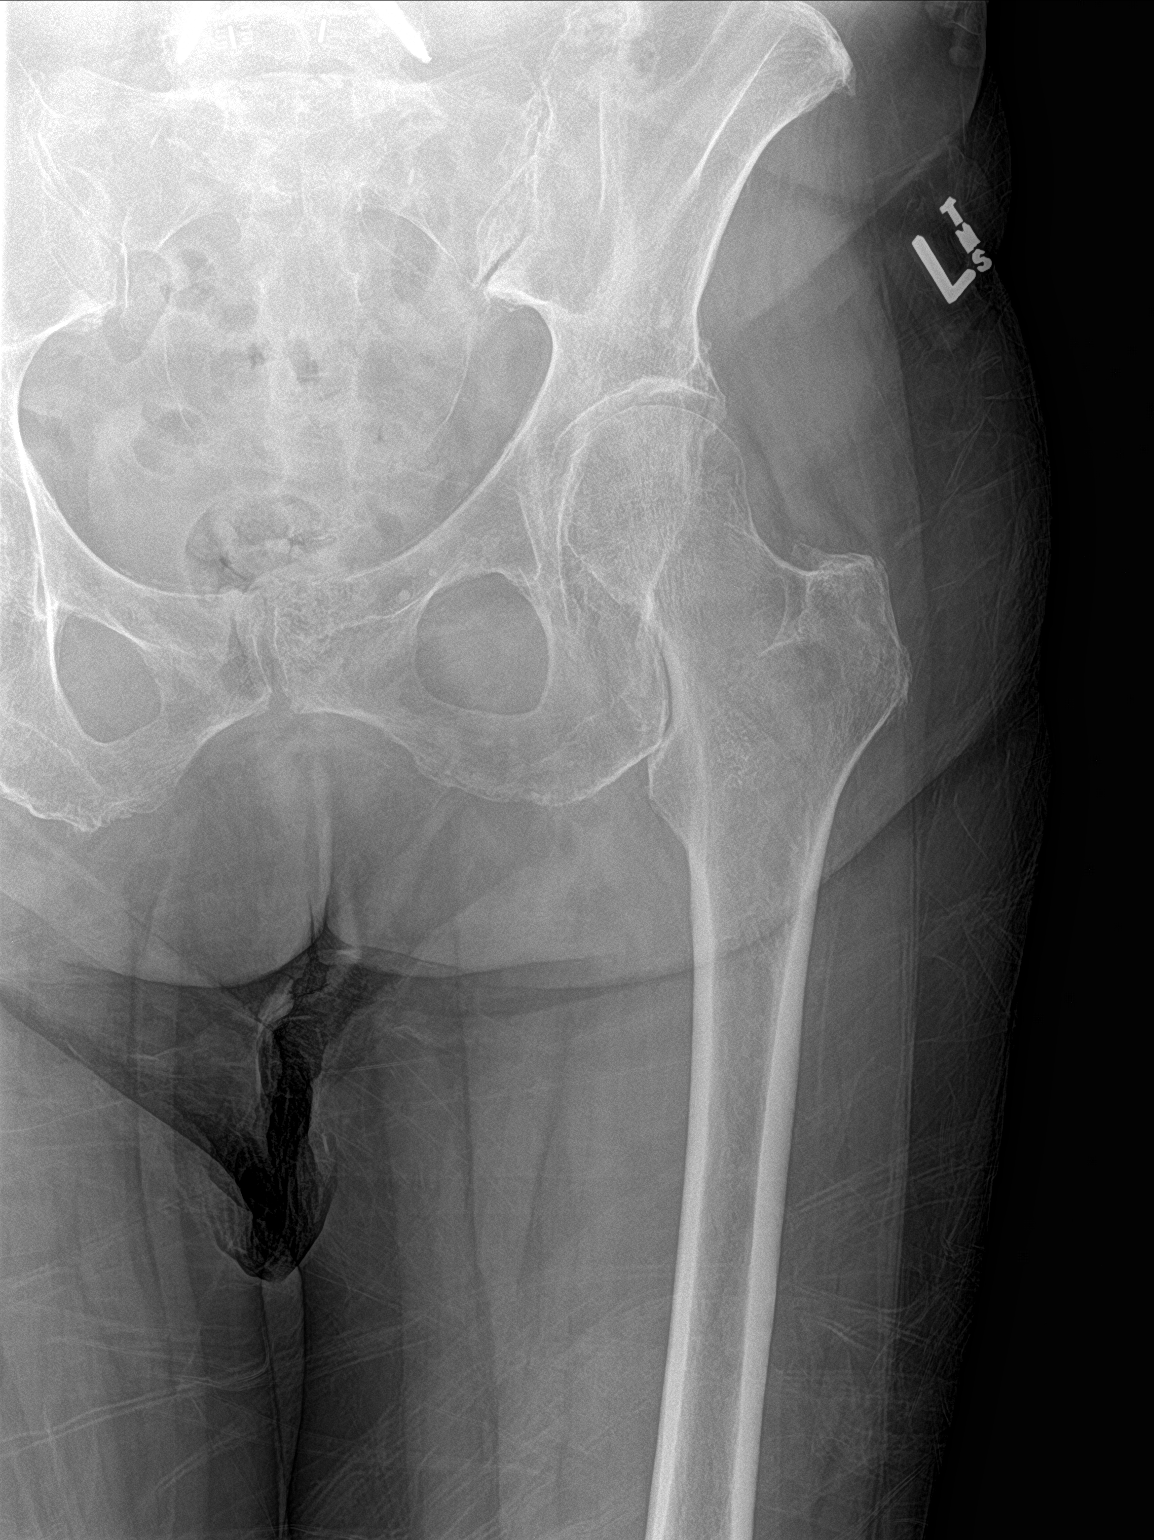

[hip lat]
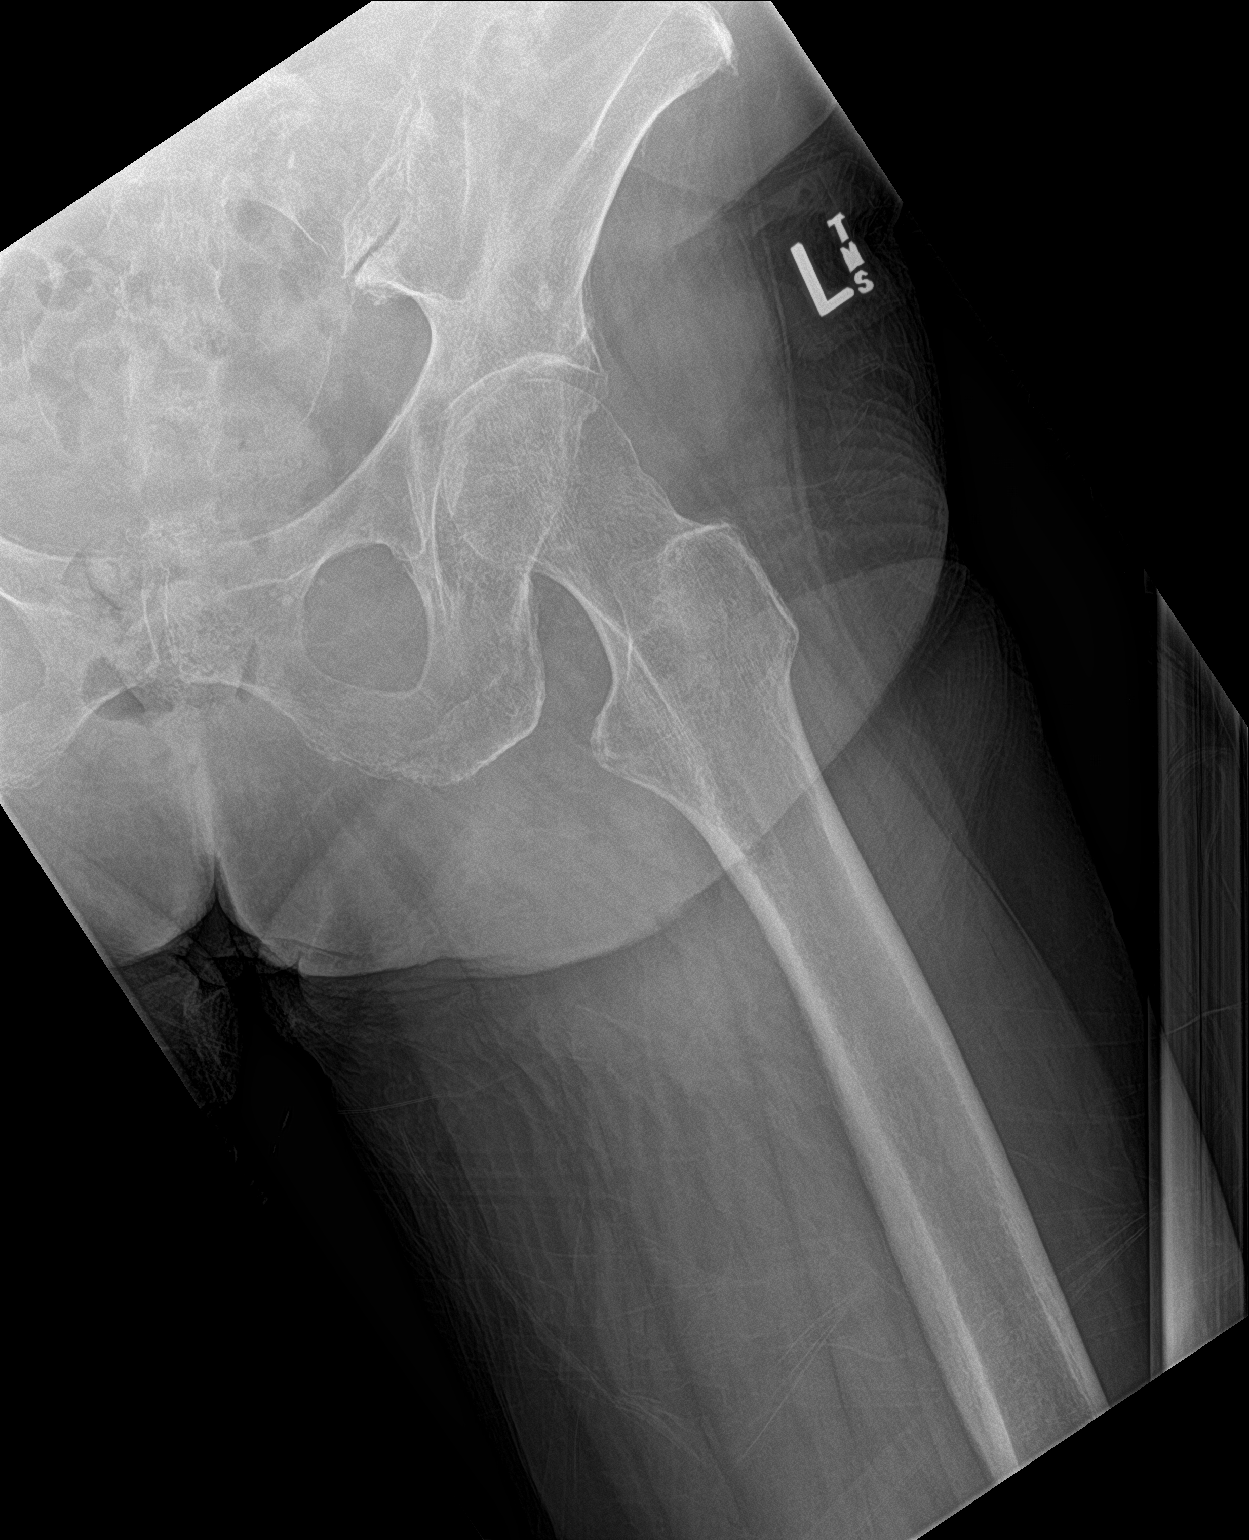

[3 of 3 positions shown; findings below may reference images not displayed]

FINDINGS: There is no evidence of hip fracture or dislocation. There is no
evidence of arthropathy or other focal bone abnormality.
IMPRESSION: Negative.

## 2020-08-03 IMAGING — CT CT PELVIS W/O CM
2 of 3 series · 16 of 46 positions shown, 18 images · non-contrast
Comparison: Pelvis and left hip radiographs [DATE]

CLINICAL DATA: Pain following fall

EXAM:
CT PELVIS WITHOUT CONTRAST
TECHNIQUE: Multidetector CT imaging of the pelvis was performed following the
standard protocol without intravenous contrast.

[Series 5: pelvis 2.0 (person_name) (person_name) · axial · 0.95mm/px · z∈[+678,+930]mm · 13 of 146 slices shown, 15 images]
[im 10/146  soft-tissue]
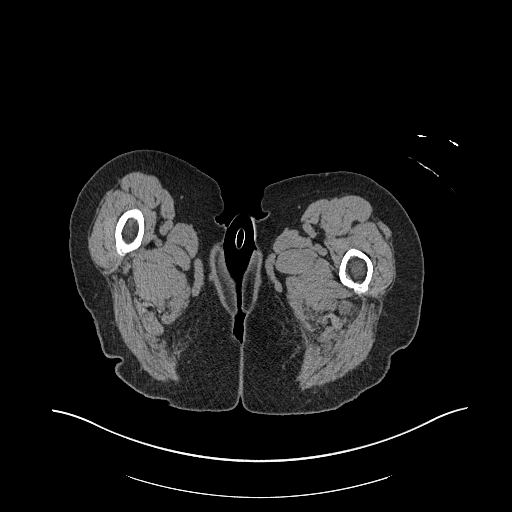
[im 10/146  bone]
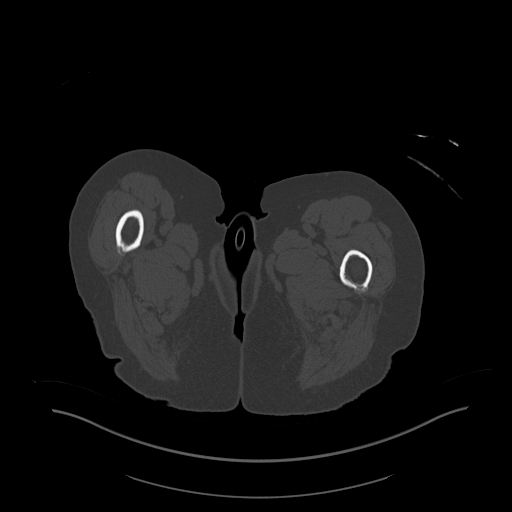
[im 19/146  soft-tissue]
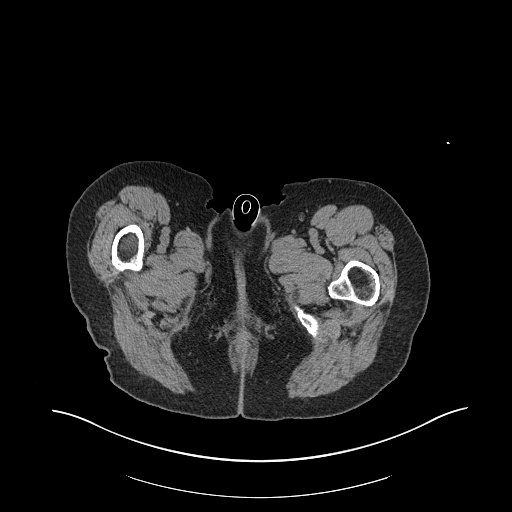
[im 29/146  soft-tissue]
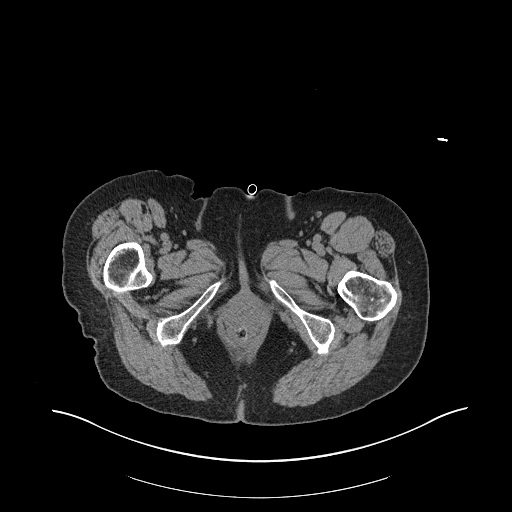
[im 43/146  soft-tissue]
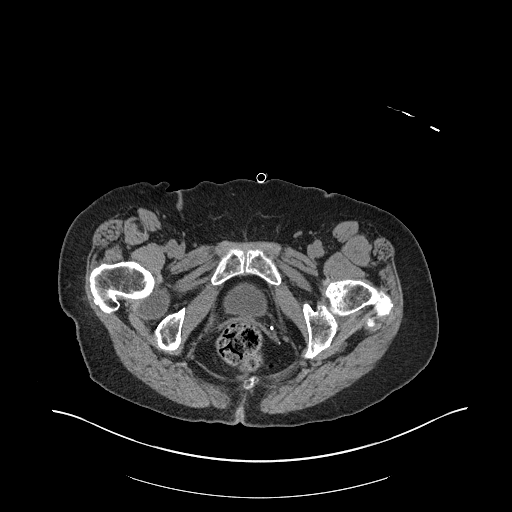
[im 52/146  soft-tissue]
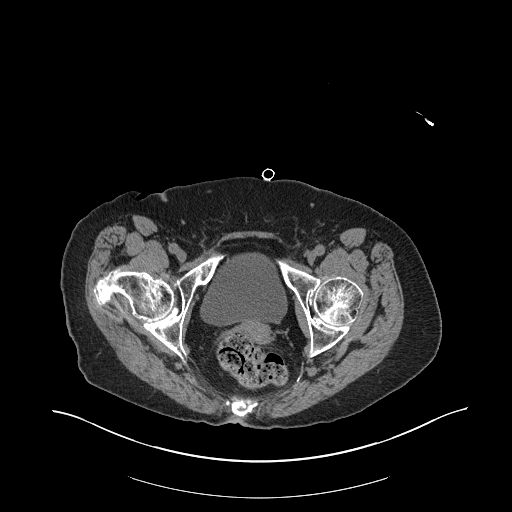
[im 61/146  soft-tissue]
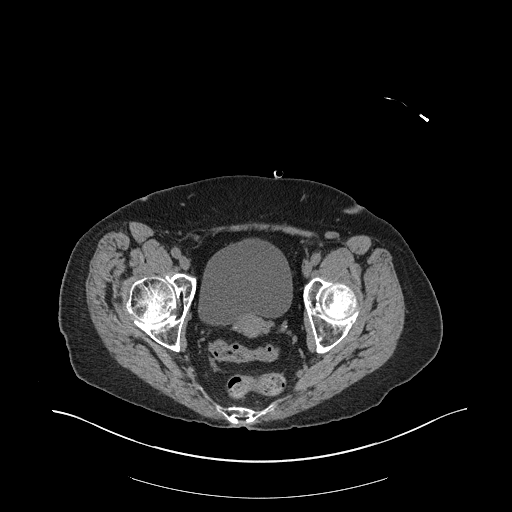
[im 75/146  soft-tissue]
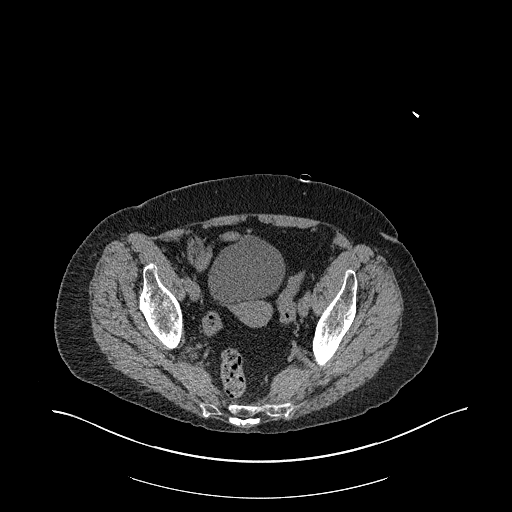
[im 85/146  soft-tissue]
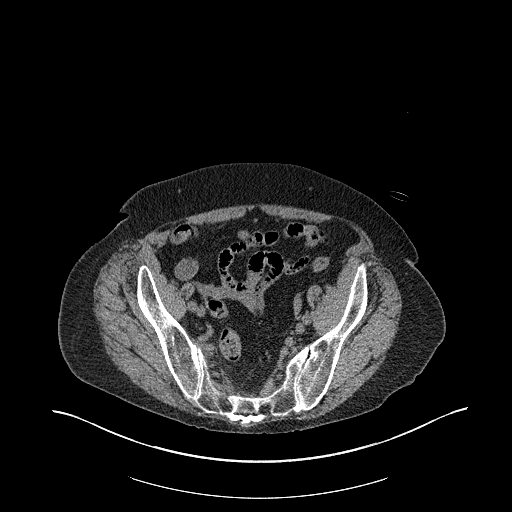
[im 94/146  soft-tissue]
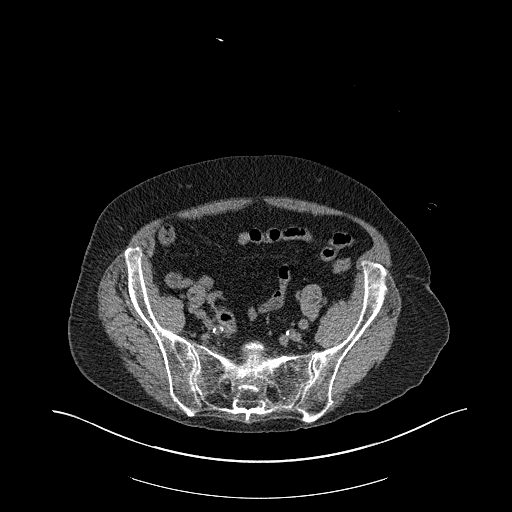
[im 94/146  bone]
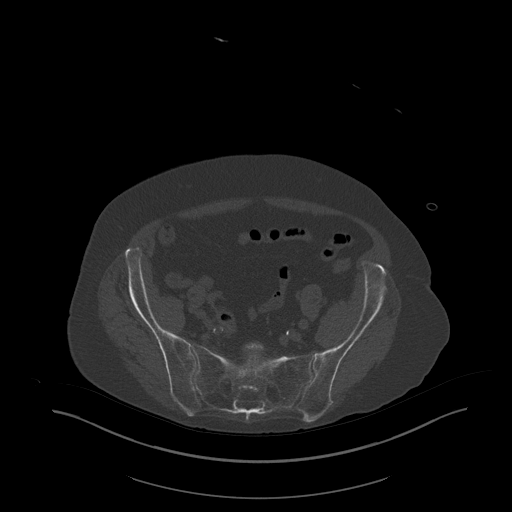
[im 103/146  soft-tissue]
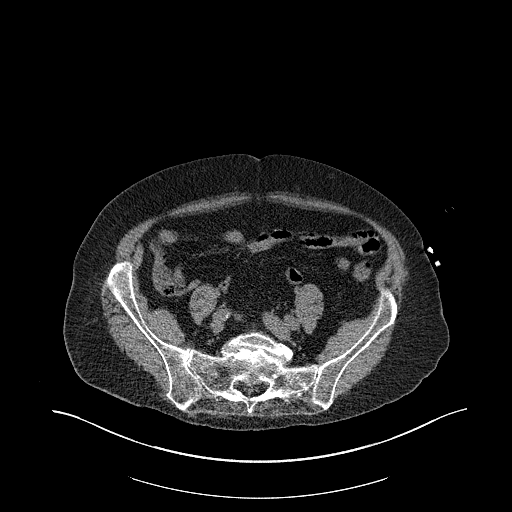
[im 117/146  soft-tissue]
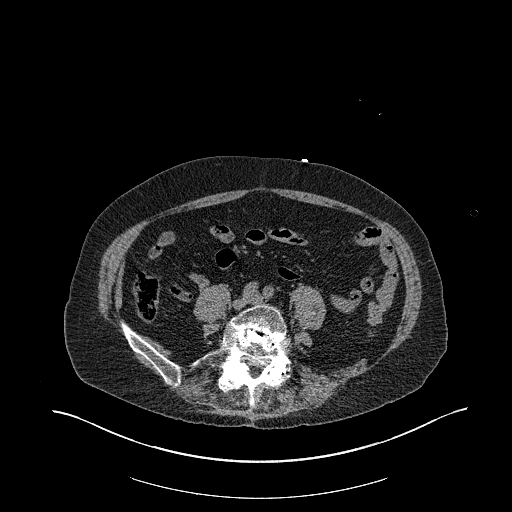
[im 127/146  soft-tissue]
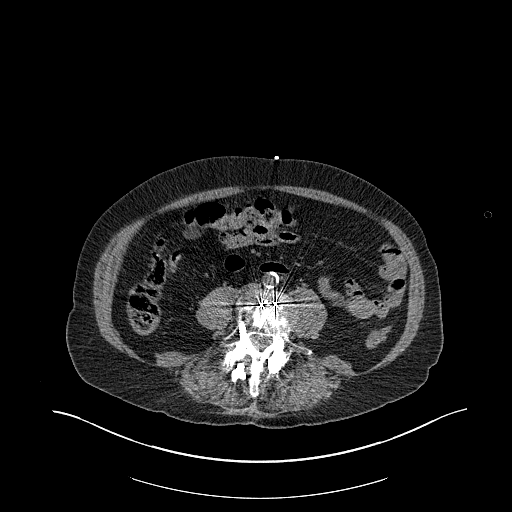
[im 136/146  soft-tissue]
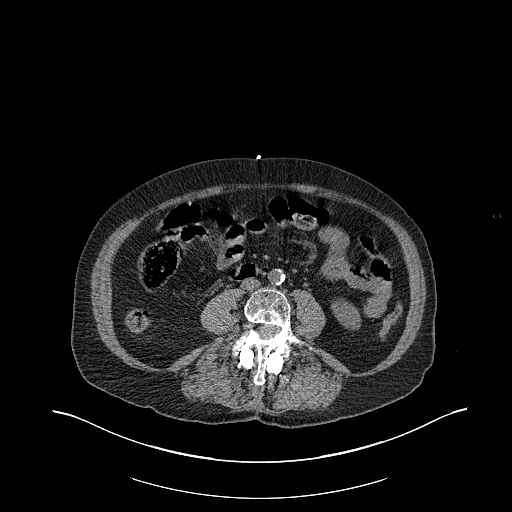

[Series 7: pelvis 2.0 cor. bone · coronal · 0.58mm/px · 3 of 147 slices shown]
[im 49/147  soft-tissue]
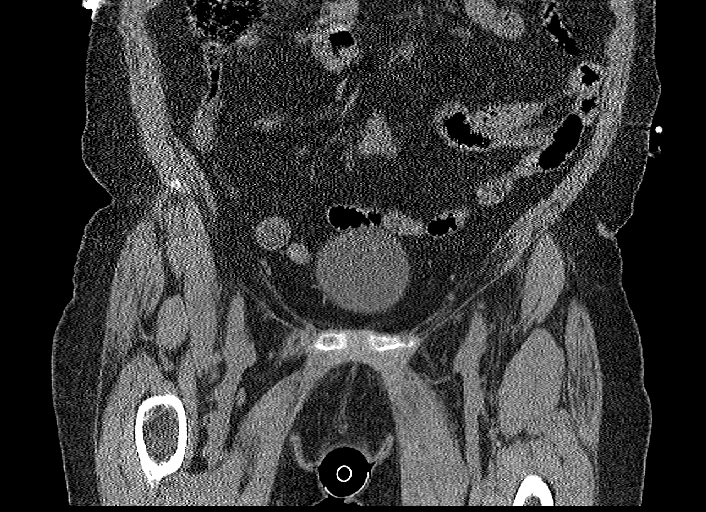
[im 65/147  soft-tissue]
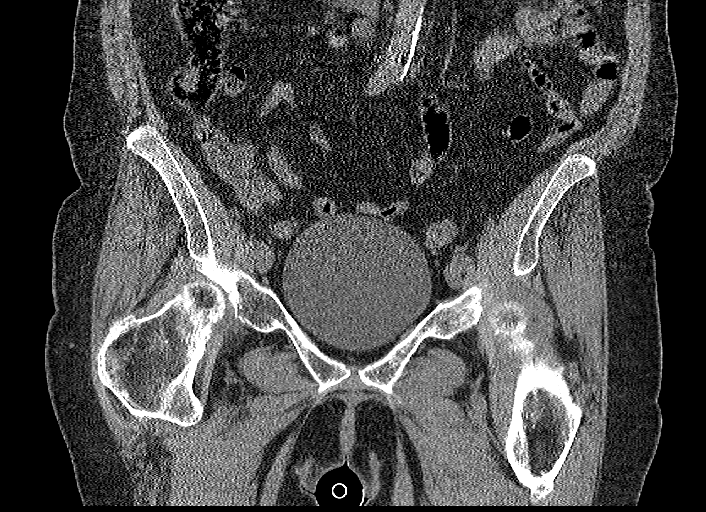
[im 82/147  soft-tissue]
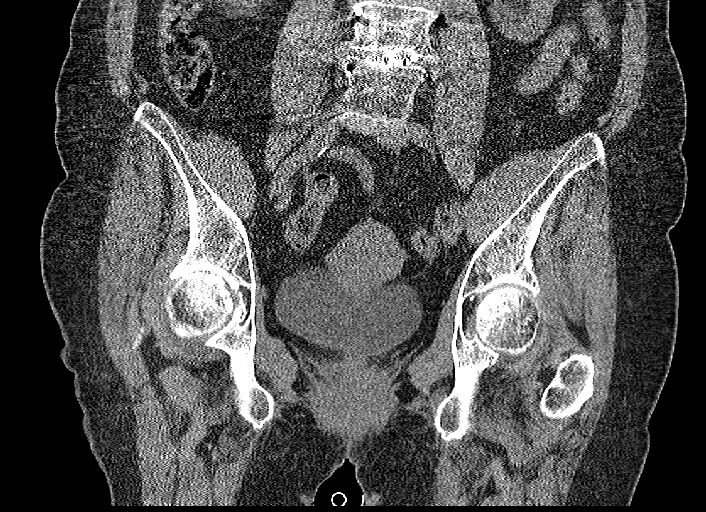

[16 of 46 positions shown; findings below may reference images not displayed]

FINDINGS: Urinary Tract: Urinary bladder midline with wall thickness normal.
No inferior renal or ureteral calculus appreciable on either side.
Visualized kidneys appear unremarkable.

Bowel: Moderate stool in colon. No evident bowel wall thickening or
bowel obstruction. No free air evident in the lower abdomen/pelvic
regions.

Vascular/Lymphatic: No distal abdominal aortic aneurysm. There is
aortic and iliac artery atherosclerotic calcification.

Reproductive: There is a calcification in the anterior uterus
measuring 7 x 7 mm, a probable small leiomyoma. No adnexal masses
are evident.

Other: No evident abscess or ascites in the lower abdomen or pelvis.
No loculated fluid collections. No evidence soft tissue mass in the
lower abdomen or pelvis.

Musculoskeletal: There is postoperative change in the lower lumbar
spine. There is 8 mm of anterolisthesis of L4 on L5 with posterior
fixation through this area.

Bones are osteoporotic. No acute fracture or dislocation is evident.
No intramuscular lesions evident. There is mild narrowing of each
hip joint and sacroiliac joint. No hip joint region infusions
evident on either side.
IMPRESSION: 1. Bones are diffusely osteoporotic. No evident fracture or
dislocation. Mild symmetric narrowing of each hip and sacroiliac
joint.

2. Postoperative pedicle screw and plate fixation from L3-L5 with
disc spacers at L3-4 and L4-5. There is 8 mm of anterolisthesis of
L4 on L5 with posterior fixation in this area. There is disc
degeneration with calcification at L5-S1.

3. No soft tissue mass or fluid collection. No intramuscular lesions
evident.

4.  Aortic Atherosclerosis ([0H]-[0H]).

4.

## 2020-08-03 IMAGING — CR DG FEMUR 2+V*L*
4 series · 4 of 4 positions shown · non-contrast
Comparison: Pelvis and left hip radiographs; pelvis CT [DATE]

CLINICAL DATA: Pain following fall

EXAM:
LEFT FEMUR 2 VIEWS

[femur ap (1 of 2)]
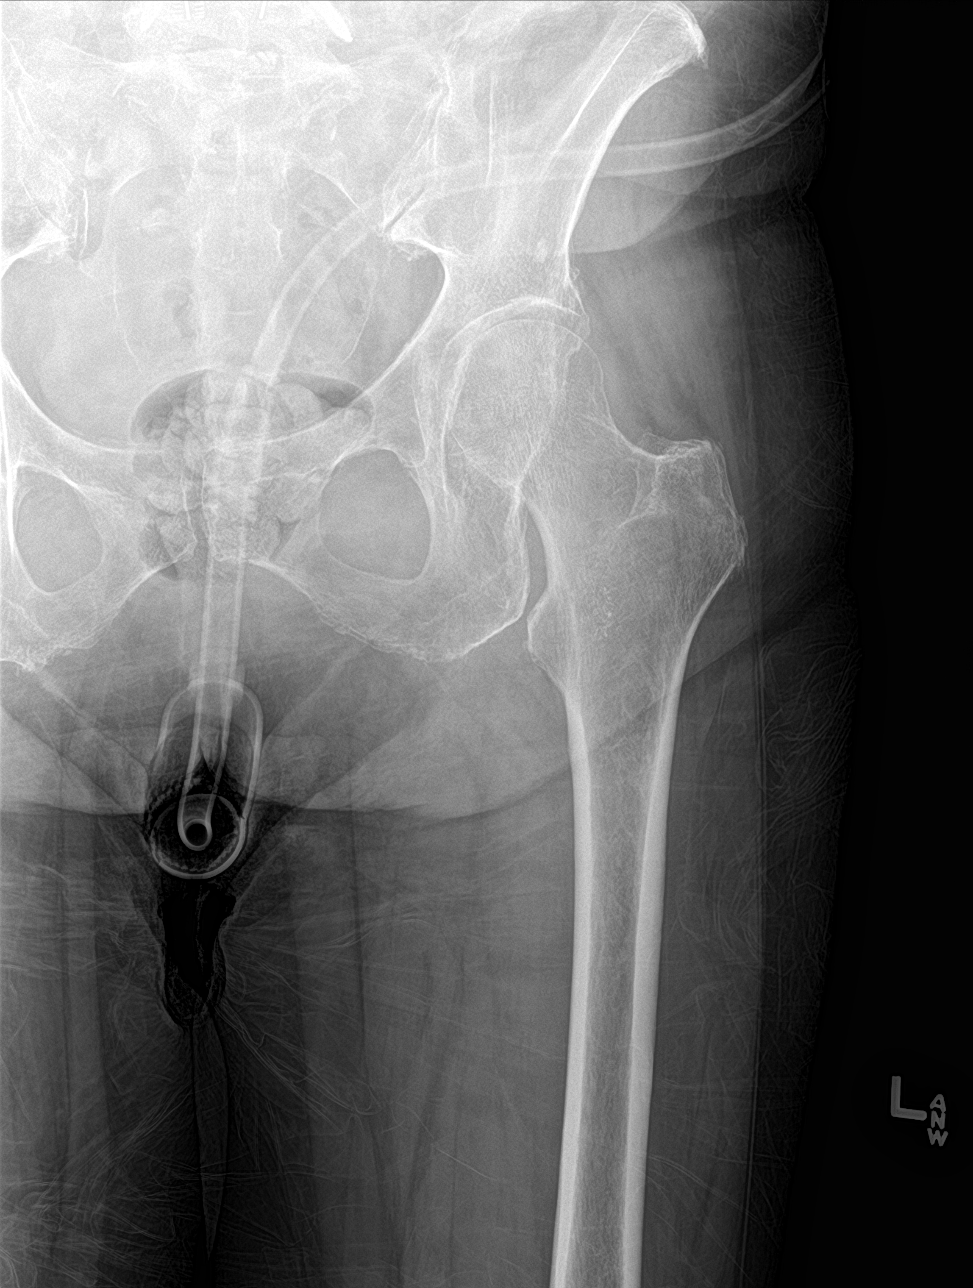

[femur ap (2 of 2)]
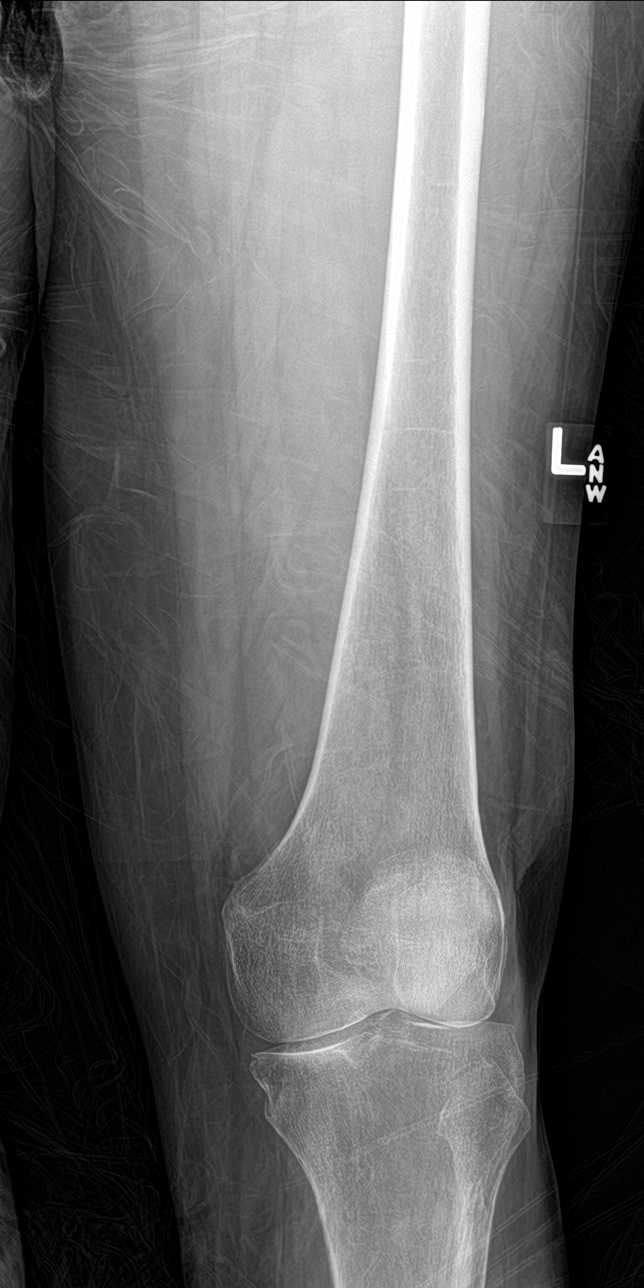

[femur lat (1 of 2)]
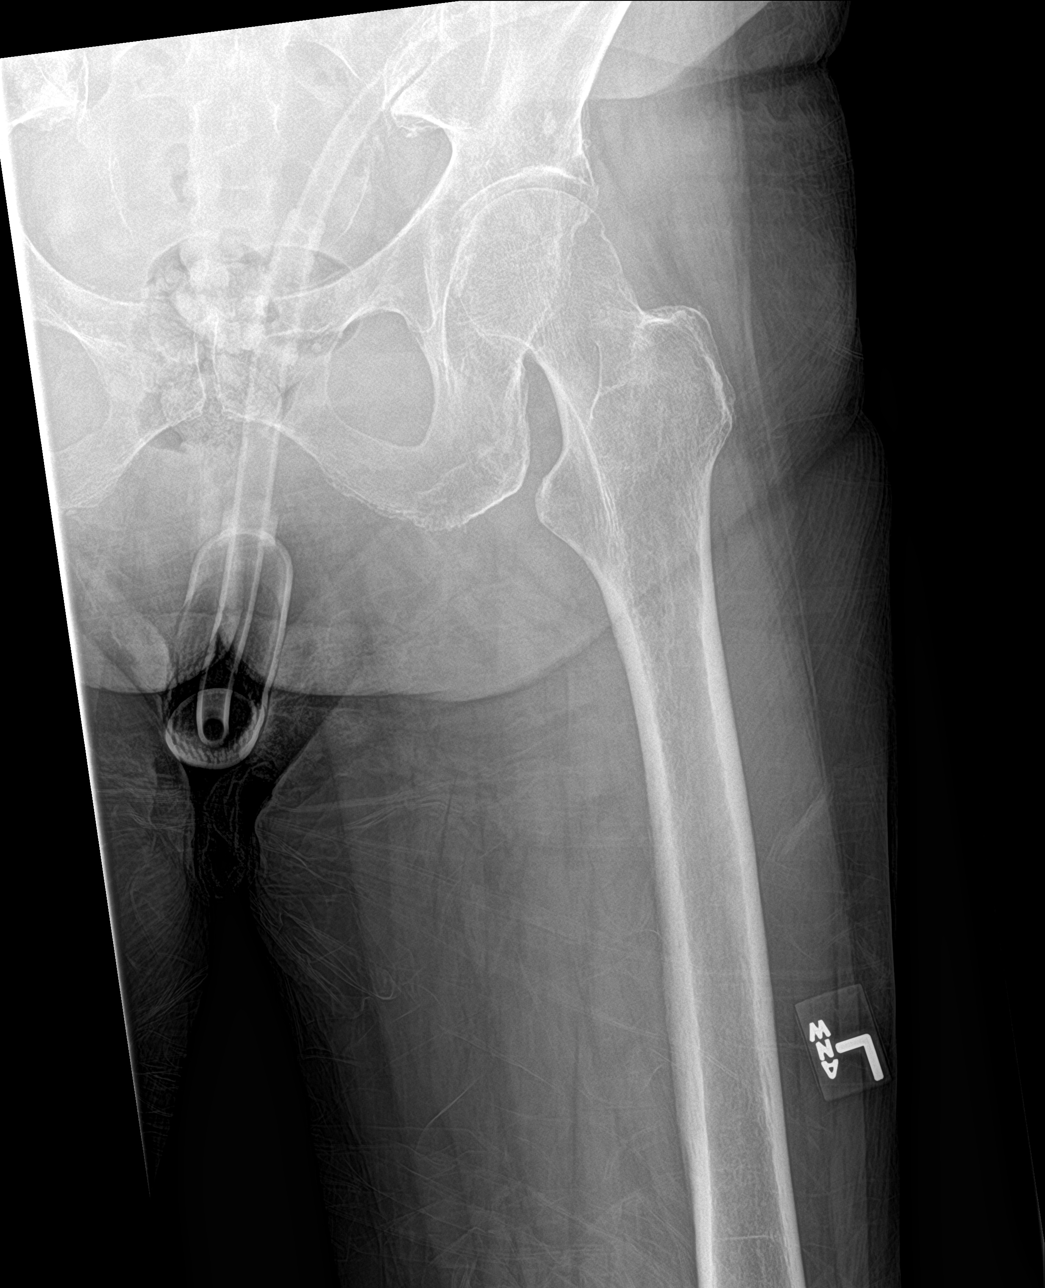

[femur lat (2 of 2)]
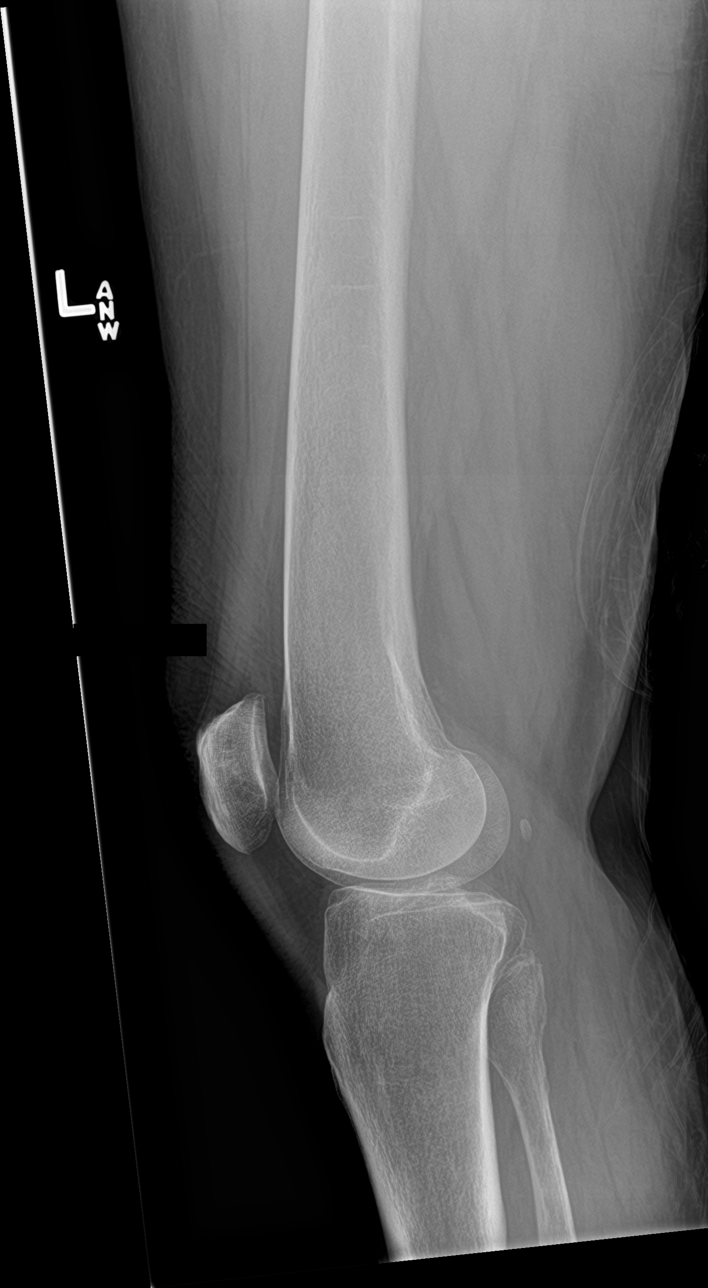

[4 of 4 positions shown; findings below may reference images not displayed]

FINDINGS: Frontal and lateral views obtained. Bones osteoporotic. No fracture
or dislocation. Mild narrowing left hip joint. No knee joint
effusion. No erosion.
IMPRESSION: Osteoporosis. No demonstrable fracture or dislocation. Mild
narrowing left hip joint.

## 2020-08-03 MED ORDER — ASPIRIN EC 81 MG PO TBEC
81.0000 mg | DELAYED_RELEASE_TABLET | ORAL | Status: DC
  Administered 2020-08-03: 81 mg via ORAL
  Filled 2020-08-03: qty 1

## 2020-08-03 MED ORDER — VITAMIN D 25 MCG (1000 UNIT) PO TABS
2000.0000 [IU] | ORAL_TABLET | Freq: Every day | ORAL | Status: DC
  Administered 2020-08-03 – 2020-08-05 (×2): 2000 [IU] via ORAL
  Filled 2020-08-03 (×2): qty 2

## 2020-08-03 MED ORDER — OXYCODONE-ACETAMINOPHEN 5-325 MG PO TABS
1.0000 | ORAL_TABLET | Freq: Once | ORAL | Status: AC
  Administered 2020-08-03: 1 via ORAL
  Filled 2020-08-03: qty 1

## 2020-08-03 MED ORDER — AMLODIPINE BESYLATE 5 MG PO TABS
10.0000 mg | ORAL_TABLET | Freq: Every day | ORAL | Status: DC
  Administered 2020-08-03 – 2020-08-04 (×2): 10 mg via ORAL
  Filled 2020-08-03 (×2): qty 2

## 2020-08-03 MED ORDER — PREDNISONE 20 MG PO TABS
10.0000 mg | ORAL_TABLET | Freq: Every morning | ORAL | Status: DC
  Administered 2020-08-04: 10 mg via ORAL
  Filled 2020-08-03: qty 1

## 2020-08-03 MED ORDER — ATORVASTATIN CALCIUM 10 MG PO TABS
20.0000 mg | ORAL_TABLET | Freq: Every day | ORAL | Status: DC
  Administered 2020-08-05: 20 mg via ORAL
  Filled 2020-08-03: qty 2

## 2020-08-03 MED ORDER — ALPRAZOLAM 0.25 MG PO TABS
0.5000 mg | ORAL_TABLET | Freq: Every day | ORAL | Status: DC
  Administered 2020-08-03 – 2020-08-04 (×2): 0.5 mg via ORAL
  Filled 2020-08-03 (×2): qty 2

## 2020-08-03 MED ORDER — LEVOTHYROXINE SODIUM 88 MCG PO TABS
88.0000 ug | ORAL_TABLET | Freq: Every day | ORAL | Status: DC
  Administered 2020-08-04: 88 ug via ORAL
  Filled 2020-08-03: qty 1

## 2020-08-03 MED ORDER — PANTOPRAZOLE SODIUM 40 MG PO TBEC
40.0000 mg | DELAYED_RELEASE_TABLET | Freq: Every day | ORAL | Status: DC
  Administered 2020-08-03 – 2020-08-04 (×2): 40 mg via ORAL
  Filled 2020-08-03 (×2): qty 1

## 2020-08-03 MED ORDER — PYRIDOSTIGMINE BROMIDE 60 MG PO TABS
60.0000 mg | ORAL_TABLET | Freq: Three times a day (TID) | ORAL | Status: DC
  Administered 2020-08-03 – 2020-08-04 (×4): 60 mg via ORAL
  Filled 2020-08-03 (×6): qty 1

## 2020-08-03 MED ORDER — FUROSEMIDE 20 MG PO TABS
10.0000 mg | ORAL_TABLET | Freq: Every day | ORAL | Status: DC
  Administered 2020-08-03 – 2020-08-04 (×2): 10 mg via ORAL
  Filled 2020-08-03: qty 1
  Filled 2020-08-03: qty 0.5

## 2020-08-03 NOTE — Progress Notes (Signed)
CSW sent referrals for SNF via hub

## 2020-08-03 NOTE — ED Notes (Signed)
Patient transported to CT 

## 2020-08-03 NOTE — Evaluation (Signed)
Physical Therapy Evaluation Patient Details Name: Audrey Cowan MRN: [id number] DOB: [birth year] Today's Date: 08/03/2020   History of Present Illness  Pt is a 80 y/o female presenting to the ED on 4/27 after inability to ambulate following falls. PMH includes MG, HTN, and CKD.  Clinical Impression  Pt presenting to ED with problem above and deficits below. Pt with increased pain in bottom and notable point tenderness at sacral area. Pt with increased pain during mobility tasks. Required max A for bed mobility, max A to stand, and mod A to take side steps at EOB. Pt unable to tolerate further ambulation. Pt reports prior to falling, she was at a mod I level using RW. Given new deficits, recommending SNF level therapies at d/c to increase independence and safety with mobility. Will continue to follow acutely.     Follow Up Recommendations SNF;Supervision/Assistance - 24 hour    Equipment Recommendations  Wheelchair (measurements PT);Wheelchair cushion (measurements PT);3in1 (PT)    Recommendations for Other Services       Precautions / Restrictions Precautions Precautions: Fall Restrictions Weight Bearing Restrictions: No      Mobility  Bed Mobility Overal bed mobility: Needs Assistance Bed Mobility: Supine to Sit;Sit to Supine     Supine to sit: Max assist Sit to supine: Max assist   General bed mobility comments: Max A for trunk and LE assist. Reporting increased pain in sitting.    Transfers Overall transfer level: Needs assistance Equipment used: None Transfers: Sit to/from Stand Sit to Stand: Max assist         General transfer comment: Max A for lift assist and steadying to stand. Pt holding to PT arms for support  Ambulation/Gait Ambulation/Gait assistance: Mod assist   Assistive device: None       General Gait Details: Mod A for steadying to take side steps at EOB. Pt with notable weakness in BLE when taking steps. Pt holding to PT arms for  support  Stairs            Wheelchair Mobility    Modified Rankin (Stroke Patients Only)       Balance Overall balance assessment: Needs assistance Sitting-balance support: Feet supported;Bilateral upper extremity supported Sitting balance-Leahy Scale: Poor Sitting balance - Comments: Reliant on BUE support   Standing balance support: Bilateral upper extremity supported Standing balance-Leahy Scale: Poor Standing balance comment: Reliant on UE and external support                             Pertinent Vitals/Pain Pain Assessment: Faces Faces Pain Scale: Hurts whole lot Pain Location: bottom; point tenderness at sacrum Pain Descriptors / Indicators: Guarding;Grimacing Pain Intervention(s): Limited activity within patient's tolerance;Monitored during session;Repositioned    Home Living Family/patient expects to be discharged to:: Private residence Living Arrangements: Children Available Help at Discharge: Family;Available PRN/intermittently Type of Home: House Home Access: Stairs to enter Entrance Stairs-Rails: Right;Left;Can reach both Entrance Stairs-Number of Steps: 4 Home Layout: One level Home Equipment: Tub bench;Walker - 2 wheels;Cane - single point;Hand held shower head      Prior Function Level of Independence: Independent with assistive device(s)         Comments: Using RW for ambulation. Reports she was mod I for ADLs.     Hand Dominance        Extremity/Trunk Assessment   Upper Extremity Assessment Upper Extremity Assessment: Defer to OT evaluation    Lower Extremity Assessment Lower Extremity  Assessment: Generalized weakness (point tenderness at sacrum/coccyx)    Cervical / Trunk Assessment Cervical / Trunk Assessment: Kyphotic  Communication   Communication: HOH;Expressive difficulties  Cognition Arousal/Alertness: Awake/alert Behavior During Therapy: WFL for tasks assessed/performed Overall Cognitive Status: Within  Functional Limits for tasks assessed                                        General Comments General comments (skin integrity, edema, etc.): pt's son present during session    Exercises     Assessment/Plan    PT Assessment Patient needs continued PT services  PT Problem List Decreased strength;Decreased activity tolerance;Decreased balance;Decreased mobility;Decreased knowledge of use of DME;Decreased knowledge of precautions;Pain       PT Treatment Interventions DME instruction;Gait training;Stair training;Functional mobility training;Therapeutic activities;Therapeutic exercise;Balance training;Patient/family education    PT Goals (Current goals can be found in the Care Plan section)  Acute Rehab PT Goals Patient Stated Goal: to be able to walk better PT Goal Formulation: With patient Time For Goal Achievement: 08/17/20 Potential to Achieve Goals: Good    Frequency Min 2X/week   Barriers to discharge        Co-evaluation               AM-PAC PT "6 Clicks" Mobility  Outcome Measure Help needed turning from your back to your side while in a flat bed without using bedrails?: A Lot Help needed moving from lying on your back to sitting on the side of a flat bed without using bedrails?: A Lot Help needed moving to and from a bed to a chair (including a wheelchair)?: A Lot Help needed standing up from a chair using your arms (e.g., wheelchair or bedside chair)?: A Lot Help needed to walk in hospital room?: Total Help needed climbing 3-5 steps with a railing? : Total 6 Click Score: 10    End of Session Equipment Utilized During Treatment: Gait belt Activity Tolerance: Patient limited by pain Patient left: in bed;with call bell/phone within reach;with family/visitor present (on stretcher in ED) Nurse Communication: Mobility status PT Visit Diagnosis: Other abnormalities of gait and mobility (R26.89);Pain;Muscle weakness (generalized) (M62.81);Difficulty  in walking, not elsewhere classified (R26.2) Pain - part of body:  (sacrum and buttocks)    Time: 7672-0947 PT Time Calculation (min) (ACUTE ONLY): 29 min   Charges:   PT Evaluation $PT Eval Moderate Complexity: 1 Mod PT Treatments $Therapeutic Activity: 8-22 mins        Lou Miner, DPT  Acute Rehabilitation Services  Pager: [phone] Office: [phone]   Rudean Hitt 08/03/2020, 2:21 PM

## 2020-08-03 NOTE — Discharge Planning (Signed)
RNCM consulted regarding safe discharge planning (Home with Home Health vs Skilled Nursing Facility Placement).  Physical Therapy evaluation placed; will follow up after recommendations from PT.     

## 2020-08-03 NOTE — ED Triage Notes (Addendum)
Pt comes from home via EMS. Unwitnessed fall getting out of shower. Son called EMS. Pt normally ambulatory but unable to ambulate due to pain. Pt a/o x4. Denies head injury and denies thinners. Pt reports left hip pain. Hx Mysthenia Gravis, HTN. 20g IV established in RAC. BP 140/80 HR 82 O2 96% RA RR 18 BGL=108  Pt reports that she did not fall today, She fell Sunday and was able to walk with her walker at baseline but today is not able to walk due to pain.

## 2020-08-03 NOTE — ED Notes (Addendum)
Pt given crackers and cheese stick per Dr Boneta Lucks. Pt's son at bedside to assist pt with snack

## 2020-08-03 NOTE — NC FL2 (Signed)
[place] LEVEL OF CARE SCREENING TOOL     IDENTIFICATION  Patient Name: Audrey Cowan Birthdate: 31-May-1940 Sex: female Admission Date (Current Location): 08/03/2020  [facility] and [place] Number:  Herbalist and Address:  [facility], [place] [address] [place], [place] [address]      Provider Number: [id number]  Attending Physician Name and Address:  Default, Provider, MD  Relative Name and Phone Number:  Shatyra, Becka   [phone]    Current Level of Care: Hospital Recommended Level of Care: [place] Prior Approval Number:    Date Approved/Denied:   PASRR Number: [id number]  Discharge Plan: SNF    Current Diagnoses: Patient Active Problem List   Diagnosis Date Noted  . S/P lumbar fusion 12/02/2019  . Dysarthria   . Hypokalemia 06/23/2019  . Benign essential HTN 06/22/2019  . Hypothyroidism 06/22/2019  . Hyperlipidemia 06/22/2019  . CKD (chronic kidney disease), stage III ([place]) 06/22/2019  . Generalized weakness 06/22/2019  . Leucocytosis 06/22/2019  . Closed trimalleolar fracture of right ankle 10/08/2018    Orientation RESPIRATION BLADDER Height & Weight     Self,Time,Situation,Place  Normal Incontinent Weight: 143 lb (64.9 kg) Height:  5' 5.5" (166.4 cm)  BEHAVIORAL SYMPTOMS/MOOD NEUROLOGICAL BOWEL NUTRITION STATUS      Continent Diet (No bread at all due to choking hazard/ Low carb)  AMBULATORY STATUS COMMUNICATION OF NEEDS Skin   Extensive Assist Verbally Normal                       Personal Care Assistance Level of Assistance  Bathing,Feeding,Dressing Bathing Assistance: Limited assistance Feeding assistance: Independent Dressing Assistance: Maximum assistance     Functional Limitations Info  Sight,Hearing,Speech Sight Info: Adequate Hearing Info: Impaired Speech Info: Adequate    SPECIAL CARE FACTORS FREQUENCY  PT (By licensed PT),OT (By licensed OT)     PT  Frequency: 5x weekly OT Frequency: 5x weely            Contractures Contractures Info: Not present    Additional Factors Info  Code Status,Allergies Code Status Info: Full Allergies Info: Pt has Myasthenia gravis so all anesthesia must be pre-approved           Current Medications (08/03/2020):  This is the current hospital active medication list No current facility-administered medications for this encounter.   Current Outpatient Medications  Medication Sig Dispense Refill  . ALPRAZolam (XANAX) 0.5 MG tablet Take 0.5 mg by mouth at bedtime. May take an additional tablet during the day if needed.    Marland Kitchen amLODipine (NORVASC) 10 MG tablet Take 1 tablet (10 mg total) by mouth daily. 30 tablet 1  . aspirin EC 81 MG tablet Take 81 mg by mouth every other day. Nighttime med    . atorvastatin (LIPITOR) 20 MG tablet Take 20 mg by mouth at bedtime.     . Cholecalciferol (VITAMIN D) 50 MCG (2000 UT) tablet Take 2,000 Units by mouth at bedtime.    . furosemide (LASIX) 20 MG tablet Take 10 mg by mouth daily.    Marland Kitchen levothyroxine (SYNTHROID) 88 MCG tablet Take 88 mcg by mouth daily before breakfast.    . pantoprazole (PROTONIX) 40 MG tablet Take 1 tablet (40 mg total) by mouth daily. 30 tablet 1  . predniSONE (DELTASONE) 5 MG tablet Take prednisone 12.5mg  x 1 month, then take 10mg  daily (Patient taking differently: Take 10 mg by mouth every morning. Take prednisone 12.5mg   x 1 month, then take 10mg  daily) 150 tablet 3  . pyridostigmine (MESTINON) 60 MG tablet TAKE 1 TABLET BY MOUTH AT 7AM, 1 TABLET AT NOON AND 1 TABLET AT 5PM (Patient taking differently: Take 60 mg by mouth 3 (three) times daily. TAKE AT 7AM, AT NOON AND AT 5PM) 90 tablet 0  . vitamin B-12 (CYANOCOBALAMIN) 1000 MCG tablet Take 1,000 mcg by mouth once a week. Mondays    . oxyCODONE (OXY IR/ROXICODONE) 5 MG immediate release tablet Take 1 tablet (5 mg total) by mouth every 6 (six) hours as needed for moderate pain ((score 4 to 6)).  (Patient not taking: No sig reported) 30 tablet 0     Discharge Medications: Please see discharge summary for a list of discharge medications.  Relevant Imaging Results:  Relevant Lab Results:   Additional Information SSN# [id number]/  Pt has had Covid vaccine + 2 boosters/ Pt has Myasthenia  Shine Mikes, LCSW

## 2020-08-03 NOTE — ED Notes (Signed)
Pt unable to ambulate, unable to get of bed with 2 assists. Pt reports extreme pain in her left buttock, hip area with movement. Dr Luana Shu aware

## 2020-08-03 NOTE — ED Provider Notes (Signed)
[facility] EMERGENCY DEPARTMENT Provider Note   CSN: [id number] Arrival date & time: 08/03/20  0092     History Chief Complaint  Patient presents with  . Fall    Audrey Cowan is a 80 y.o. female.  She is presenting by EMS from home with pain and weakness in her legs.  She had a mechanical fall 3 days ago when her chair got caught up on the mat and she fell onto her buttocks.  No loss of consciousness.  Since then she has had weakness on her legs and she has been unable to walk even with her walker.  She has a history of myasthenia gravis and has had generalized weakness and is on chronic steroids.  She feels like her legs will not support her now.  Pain primarily in the left hip.  The history is provided by the patient.  Fall This is a new problem. The current episode started more than 2 days ago. The problem has not changed since onset.Pertinent negatives include no chest pain, no abdominal pain, no headaches and no shortness of breath. The symptoms are aggravated by walking. Nothing relieves the symptoms. She has tried rest for the symptoms. The treatment provided no relief.       Past Medical History:  Diagnosis Date  . Chronic kidney disease    CKD  . Complication of anesthesia    Myasthenia Gravis  . Hypercholesteremia   . Hypertension   . Hypothyroidism   . Myasthenia gravis ([place])   . Trimalleolar fracture    RIGHT ANKLE    Patient Active Problem List   Diagnosis Date Noted  . S/P lumbar fusion 12/02/2019  . Dysarthria   . Hypokalemia 06/23/2019  . Benign essential HTN 06/22/2019  . Hypothyroidism 06/22/2019  . Hyperlipidemia 06/22/2019  . CKD (chronic kidney disease), stage III ([place]) 06/22/2019  . Generalized weakness 06/22/2019  . Leucocytosis 06/22/2019  . Closed trimalleolar fracture of right ankle 10/08/2018    Past Surgical History:  Procedure Laterality Date  . colonoscopy    . EYE SURGERY Left    cataracts  . ORIF ANKLE  FRACTURE Right 10/09/2018   Procedure: OPEN REDUCTION INTERNAL FIXATION (ORIF) Right ankle trimalleolar fracture;  Surgeon: Wylene Simmer, MD;  Location: [place];  Service: Orthopedics;  Laterality: Right;  76min     OB History   No obstetric history on file.     Family History  Problem Relation Age of Onset  . Leukemia Sister     Social History   Tobacco Use  . Smoking status: Former Smoker    Types: Cigarettes    Quit date: 1970    Years since quitting: 52.3  . Smokeless tobacco: Never Used  Vaping Use  . Vaping Use: Never used  Substance Use Topics  . Alcohol use: Not Currently  . Drug use: Never    Home Medications Prior to Admission medications   Medication Sig Start Date End Date Taking? Authorizing Provider  ALPRAZolam (XANAX) 0.25 MG tablet Take 0.25 mg by mouth at bedtime.  10/28/19   [provider]  amLODipine (NORVASC) 10 MG tablet Take 1 tablet (10 mg total) by mouth daily. 07/04/19   Shelly Coss, MD  aspirin EC 81 MG tablet Take 81 mg by mouth every other day.    [provider]  atorvastatin (LIPITOR) 20 MG tablet Take 20 mg by mouth at bedtime.     [provider]  Cholecalciferol (VITAMIN D)  50 MCG (2000 UT) tablet Take 2,000 Units by mouth at bedtime.    [provider]  furosemide (LASIX) 20 MG tablet Take 10 mg by mouth every other day.     [provider]  levothyroxine (SYNTHROID) 88 MCG tablet Take 88 mcg by mouth daily before breakfast.    [provider]  oxyCODONE (OXY IR/ROXICODONE) 5 MG immediate release tablet Take 1 tablet (5 mg total) by mouth every 6 (six) hours as needed for moderate pain ((score 4 to 6)). 12/03/19   Eustace Moore, MD  pantoprazole (PROTONIX) 40 MG tablet Take 1 tablet (40 mg total) by mouth daily. 07/04/19 07/03/20  Shelly Coss, MD  predniSONE (DELTASONE) 5 MG tablet Take prednisone 12.5mg  x 1 month, then take 10mg  daily 05/27/20   Narda Amber K, DO   pyridostigmine (MESTINON) 60 MG tablet TAKE 1 TABLET BY MOUTH AT 7AM, 1 TABLET AT NOON AND 1 TABLET AT 5PM 08/01/20   Narda Amber K, DO  vitamin B-12 (CYANOCOBALAMIN) 1000 MCG tablet Take 1,000 mcg by mouth daily with lunch.     [provider]    Allergies    Other  Review of Systems   Review of Systems  Constitutional: Negative for fever.  HENT: Negative for sore throat.   Eyes: Negative for visual disturbance.  Respiratory: Negative for shortness of breath.   Cardiovascular: Negative for chest pain.  Gastrointestinal: Negative for abdominal pain.  Genitourinary: Negative for dysuria.  Musculoskeletal: Positive for gait problem. Negative for neck pain.  Skin: Negative for rash.  Neurological: Negative for headaches.    Physical Exam Updated Vital Signs BP (!) 152/88 (BP Location: Left Arm)   Pulse 100   Temp 99.1 F (37.3 C) (Oral)   Resp 17   SpO2 98%   Physical Exam Vitals and nursing note reviewed.  Constitutional:      General: She is not in acute distress.    Appearance: Normal appearance. She is well-developed.  HENT:     Head: Normocephalic and atraumatic.  Eyes:     Conjunctiva/sclera: Conjunctivae normal.  Cardiovascular:     Rate and Rhythm: Normal rate and regular rhythm.     Heart sounds: No murmur heard.   Pulmonary:     Effort: Pulmonary effort is normal. No respiratory distress.     Breath sounds: Normal breath sounds.  Abdominal:     Palpations: Abdomen is soft.     Tenderness: There is no abdominal tenderness.  Musculoskeletal:        General: Tenderness present. No deformity. Normal range of motion.     Cervical back: Neck supple.     Comments: She has full range of motion of her upper extremities without any limitations.  Left lower extremity tenderness about her hip worse with movement.  Knee is diffusely tender.  Ankles no tenderness.  Distal pulses intact  Skin:    General: Skin is warm and dry.     Capillary Refill:  Capillary refill takes less than 2 seconds.  Neurological:     General: No focal deficit present.     Mental Status: She is alert. Mental status is at baseline.     ED Results / Procedures / Treatments   Labs (all labs ordered are listed, but only abnormal results are displayed) Labs Reviewed  BASIC METABOLIC PANEL - Abnormal; Notable for the following components:      Result Value   Creatinine, Ser 1.05 (*)    GFR, Estimated 54 (*)  All other components within normal limits  CBC WITH DIFFERENTIAL/PLATELET - Abnormal; Notable for the following components:   WBC 14.3 (*)    Neutro Abs 9.9 (*)    Monocytes Absolute 1.2 (*)    Abs Immature Granulocytes 0.22 (*)    All other components within normal limits  URINALYSIS, ROUTINE W REFLEX MICROSCOPIC - Abnormal; Notable for the following components:   pH 9.0 (*)    All other components within normal limits  RESP PANEL BY RT-PCR (FLU A&B, COVID) [id number]    EKG EKG Interpretation  Date/Time:  Wednesday August 03 2020 07:40:36 EDT Ventricular Rate:  87 PR Interval:  135 QRS Duration: 88 QT Interval:  353 QTC Calculation: 425 R Axis:   31 Text Interpretation: Sinus rhythm Low voltage, extremity leads Consider anterior infarct No significant change since prior 3/21 Confirmed by Aletta Edouard [phone]) on 08/03/2020 7:50:23 AM   Radiology CT PELVIS WO CONTRAST  Result Date: 08/03/2020 CLINICAL DATA:  Pain following fall EXAM: CT PELVIS WITHOUT CONTRAST TECHNIQUE: Multidetector CT imaging of the pelvis was performed following the standard protocol without intravenous contrast. COMPARISON:  Pelvis and left hip radiographs August 03, 2020 FINDINGS: Urinary Tract: Urinary bladder midline with wall thickness normal. No inferior renal or ureteral calculus appreciable on either side. Visualized kidneys appear unremarkable. Bowel: Moderate stool in colon. No evident bowel wall thickening or bowel obstruction. No free air evident in the lower  abdomen/pelvic regions. Vascular/Lymphatic: No distal abdominal aortic aneurysm. There is aortic and iliac artery atherosclerotic calcification. Reproductive: There is a calcification in the anterior uterus measuring 7 x 7 mm, a probable small leiomyoma. No adnexal masses are evident. Other: No evident abscess or ascites in the lower abdomen or pelvis. No loculated fluid collections. No evidence soft tissue mass in the lower abdomen or pelvis. Musculoskeletal: There is postoperative change in the lower lumbar spine. There is 8 mm of anterolisthesis of L4 on L5 with posterior fixation through this area. Bones are osteoporotic. No acute fracture or dislocation is evident. No intramuscular lesions evident. There is mild narrowing of each hip joint and sacroiliac joint. No hip joint region infusions evident on either side. IMPRESSION: 1. Bones are diffusely osteoporotic. No evident fracture or dislocation. Mild symmetric narrowing of each hip and sacroiliac joint. 2. Postoperative pedicle screw and plate fixation from 075-GRM with disc spacers at L3-4 and L4-5. There is 8 mm of anterolisthesis of L4 on L5 with posterior fixation in this area. There is disc degeneration with calcification at L5-S1. 3. No soft tissue mass or fluid collection. No intramuscular lesions evident. 4.  Aortic Atherosclerosis (ICD10-I70.0). 4. Electronically Signed   By: Lowella Grip III M.D.   On: 08/03/2020 10:53   DG Hip Unilat With Pelvis 2-3 Views Left  Result Date: 08/03/2020 CLINICAL DATA:  Left hip pain after fall several days ago. EXAM: DG HIP (WITH OR WITHOUT PELVIS) 2-3V LEFT COMPARISON:  None. FINDINGS: There is no evidence of hip fracture or dislocation. There is no evidence of arthropathy or other focal bone abnormality. IMPRESSION: Negative. Electronically Signed   By: Marijo Conception M.D.   On: 08/03/2020 08:25   DG FEMUR MIN 2 VIEWS LEFT  Result Date: 08/03/2020 CLINICAL DATA:  Pain following fall EXAM: LEFT FEMUR 2  VIEWS COMPARISON:  Pelvis and left hip radiographs; pelvis CT August 03, 2020 FINDINGS: Frontal and lateral views obtained. Bones osteoporotic. No fracture or dislocation. Mild narrowing left hip joint. No knee joint effusion. No erosion. IMPRESSION:  Osteoporosis. No demonstrable fracture or dislocation. Mild narrowing left hip joint. Electronically Signed   By: Lowella Grip III M.D.   On: 08/03/2020 12:18    Procedures Procedures   Medications Ordered in ED Medications  ALPRAZolam (XANAX) tablet 0.5 mg (has no administration in time range)  amLODipine (NORVASC) tablet 10 mg (has no administration in time range)  aspirin EC tablet 81 mg (has no administration in time range)  atorvastatin (LIPITOR) tablet 20 mg (has no administration in time range)  cholecalciferol (VITAMIN D3) tablet 2,000 Units (has no administration in time range)  furosemide (LASIX) tablet 10 mg (has no administration in time range)  levothyroxine (SYNTHROID) tablet 88 mcg (has no administration in time range)  pantoprazole (PROTONIX) EC tablet 40 mg (has no administration in time range)  predniSONE (DELTASONE) tablet 10 mg (has no administration in time range)  pyridostigmine (MESTINON) tablet 60 mg (has no administration in time range)  oxyCODONE-acetaminophen (PERCOCET/ROXICET) 5-325 MG per tablet 1 tablet (1 tablet Oral Given 08/03/20 1130)    ED Course  I have reviewed the triage vital signs and the nursing notes.  Pertinent labs & imaging results that were available during my care of the patient were reviewed by me and considered in my medical decision making (see chart for details).  Clinical Course as of 08/03/20 2033  Wed Aug 03, 2020  0837 X-rays of the pelvis and left hip do not show any acute fracture or dislocation.  Have ordered CT pelvis. [MB]  6387 Discussed her case with neurology Dr. Quinn Axe.  She felt like a neuro consult would be helpful.  Awaiting their recommendations. [MB]  1120 Patient still  unable to get to even sit on the side of the bed complaining of pain in her left thigh. [MB]  1305 Patient son is here now so I reviewed the work-up with him.  Still waiting on physical therapy for evaluation. [MB]    Clinical Course User Index [MB] Hayden Rasmussen, MD   MDM Rules/Calculators/A&P                         This patient complains of pain in her buttocks and left hip after a fall inability to walk; this involves an extensive number of treatment Options and is a complaint that carries with it a high risk of complications and Morbidity. The differential includes fracture, contusion, retroperitoneal bleed, metabolic derangement, myasthenia gravis, infection  I ordered, reviewed and interpreted labs, which included CBC with elevated white count, normal hemoglobin, chemistries fairly normal, COVID testing negative, urinalysis unremarkable I ordered medication oral pain medicine I ordered imaging studies which included pelvis and left hip x-ray, CT pelvis, x-ray left femur and I independently    visualized and interpreted imaging which showed no acute traumatic findings Additional history obtained from patient's son and EMS Previous records obtained and reviewed in epic, no recent admissions I consulted neurology Dr. Quinn Axe, physical therapy, transitions of care and discussed lab and imaging findings  Critical Interventions: None  After the interventions stated above, I reevaluated the patient and found patient still to be unable to safely ambulate.  Unfortunately unable to send home in current condition.  Patient boarding until transitions of care can see if patient will qualify for SNF.  Patient signed out to oncoming provider Dr. Billy Fischer to follow-up on med reconciliation form and recommendations from [facility]   Final Clinical Impression(s) / ED Diagnoses Final diagnoses:  Left hip pain  Unsteady  gait  Myasthenia gravis [facility])    Rx / [place] Orders ED Discharge Orders    None        Hayden Rasmussen, MD 08/03/20 2036

## 2020-08-03 NOTE — Consult Note (Addendum)
Neurology Consultation  Reason for Consult: LLE weakness with falls in a MG patient Referring Physician: Dr. Carin Hock  CC: 2 falls since Sunday with buttock pain and leg weakness in a MG patient  History is obtained from: patient, chart  HPI: Audrey Cowan is a 80 y.o. female with a PMHx of MG without thymoma, HTN, HLD, CKD, hypothyroidism, lumbar stenosis and spondylolisthesis s/p lumbar surgery 11/2019. Patient presented to ED today with c/o back/buttock pain and worsened leg weakness after a fall from a chair on 07/31/20 and after sliding off the bed onto her buttocks this a.m.  Because of her history of MG, neurology was asked to see to r/o MG exacerbation as her reason for leg weakness.   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 She has been walking with a walker x 2 years since she broke her ankle and post lumbar surgery. She was doing HHPT which has ended. Per patient, she had plateau of progress, so PT asked her just to call back if she worsened. She states she was doing well ambulating with a walker until her fall on 07/31/20. She felt OK on 08/01/20, then on 08/02/20, she could barely walk. States she had difficulty pulling herself up from a chair even with the walker due to buttock pain. After today's sliding to her buttock from the bed, she decided to call 911 and arrived at ED via EMS.    After her back surgery last year, she got well without issues. Today, her legs feel like "bricks". She has seen Dr. Posey Pronto, whom she follows for MG, routinely since diagnosis 06/2019. NP reviewed last 3 notes. Summary: Patient had IVIG q 3 weeks for awhile, then that was tapered to q 6 weeks x 1, then q 8 weeks x 1, then stopped in 03/2020. Dr. Posey Pronto was also weaning her Prednisone slowly over months time. Her original dose was 20 mg po qd and at last visit, she was tapered to 10 mg a day. She takes Mestinon 60 mg po tid at 0700 hours, noon, and 1700 hours. She states she never misses a dose. Her morning  dose today was given late due to her arrival to ED.   She does not feel like she has in the past with an exacerbation. She is having no difficulty with swallowing. She has chronic slurring of her words since her MG diagnosis. Her eyes do not feel tired and she has no trouble with her vision. Her eyes only tire after she reads "about half of a book" in one sitting. No double vision.   Re: BLE neuropathy, Dr. Posey Pronto is aware. She was tried on Gabapentin once, but it made her MG symptoms worse. Has never tried Lyrica.   She has not had a recent illness, fever, chills, n/v, or HA.   ROS: A robust ROS was performed and is negative except as noted in the HPI.   Past Medical History:  Diagnosis Date  . Chronic kidney disease    CKD  . Complication of anesthesia    Myasthenia Gravis  . Hypercholesteremia   . Hypertension   . Hypothyroidism   . Myasthenia gravis ([place])   . Trimalleolar fracture    RIGHT ANKLE    Family History  Problem Relation Age of Onset  . Leukemia Sister     Social History:   reports that she quit smoking about 52 years ago. Her smoking use included cigarettes. She has never used smokeless tobacco. She reports previous alcohol  use. She reports that she does not use drugs.  Medications No current facility-administered medications for this encounter.  Current Outpatient Medications:  .  ALPRAZolam (XANAX) 0.25 MG tablet, Take 0.25 mg by mouth at bedtime. , Disp: , Rfl:  .  amLODipine (NORVASC) 10 MG tablet, Take 1 tablet (10 mg total) by mouth daily., Disp: 30 tablet, Rfl: 1 .  aspirin EC 81 MG tablet, Take 81 mg by mouth every other day., Disp: , Rfl:  .  atorvastatin (LIPITOR) 20 MG tablet, Take 20 mg by mouth at bedtime. , Disp: , Rfl:  .  Cholecalciferol (VITAMIN D) 50 MCG (2000 UT) tablet, Take 2,000 Units by mouth at bedtime., Disp: , Rfl:  .  furosemide (LASIX) 20 MG tablet, Take 10 mg by mouth every other day. , Disp: , Rfl:  .  levothyroxine (SYNTHROID) 88  MCG tablet, Take 88 mcg by mouth daily before breakfast., Disp: , Rfl:  .  oxyCODONE (OXY IR/ROXICODONE) 5 MG immediate release tablet, Take 1 tablet (5 mg total) by mouth every 6 (six) hours as needed for moderate pain ((score 4 to 6))., Disp: 30 tablet, Rfl: 0 .  pantoprazole (PROTONIX) 40 MG tablet, Take 1 tablet (40 mg total) by mouth daily., Disp: 30 tablet, Rfl: 1 .  predniSONE (DELTASONE) 5 MG tablet, Take prednisone 12.5mg  x 1 month, then take 10mg  daily, Disp: 150 tablet, Rfl: 3 .  pyridostigmine (MESTINON) 60 MG tablet, TAKE 1 TABLET BY MOUTH AT 7AM, 1 TABLET AT NOON AND 1 TABLET AT 5PM, Disp: 90 tablet, Rfl: 0 .  vitamin B-12 (CYANOCOBALAMIN) 1000 MCG tablet, Take 1,000 mcg by mouth daily with lunch. , Disp: , Rfl:    Exam: Current vital signs: BP 135/81   Pulse 89   Temp 99.1 F (37.3 C) (Oral)   Resp 18   Ht 5' 5.5" (1.664 m)   Wt 64.9 kg   SpO2 94%   BMI 23.43 kg/m  Vital signs in last 24 hours: Temp:  [99.1 F (37.3 C)] 99.1 F (37.3 C) (04/27 0742) Pulse Rate:  [85-100] 89 (04/27 1130) Resp:  [15-25] 18 (04/27 1130) BP: (120-152)/(68-92) 135/81 (04/27 1130) SpO2:  [93 %-98 %] 94 % (04/27 1130) Weight:  [64.9 kg] 64.9 kg (04/27 0749)  GENERAL: Awake, alert in NAD HEENT: - Normocephalic and atraumatic, moist mucous membranes. No thyromegaly. Missing some teeth.  LUNGS - Normal respiratory effort.  CV - RRR on tele ABDOMEN - Soft, nontender Ext: warm, well perfused Psych: Light affect, laughing.   NEURO:  Mental Status: AA&Ox3  Speech/Language: speech with chronic slurring. No aphasia. Comprehension intact. Cranial Nerves:  II: PERRL 41mm/brisk. visual fields full.  III, IV, VI: EOMI. Lid elevation symmetric and full. No ptosis or fatiguable upgaze after looking upward x 20 seconds.  V: sensation is intact and symmetrical to face.  VII: Smile is symmetrical. Able to raise eyebrows symmetrically.  VIII:hearing intact to voice IX, X: palate elevation is  symmetric. Phonation normal.  XI: normal sternocleidomastoid and trapezius muscle strength [id number] is symmetrical without fasciculations.   Motor: 5/5 strength to bilateral grips, biceps, and triceps. 4+/5 strength with plantar and dorsiflexion bilaterally. Knees 5/5. Thighs-unable to hold off the bed very high, but she does have strength against gravity.  Tone is normal. Bulk is normal.  Sensation- Intact to light touch bilaterally in all four extremities. Extinction absent to light touch to DSS.  Gait- deferred  CBC    Component Value Date/Time   WBC 14.3 (  H) 08/03/2020 0817   RBC 4.80 08/03/2020 0817   HGB 14.3 08/03/2020 0817   HCT 43.6 08/03/2020 0817   PLT 254 08/03/2020 0817   MCV 90.8 08/03/2020 0817   MCH 29.8 08/03/2020 0817   MCHC 32.8 08/03/2020 0817   RDW 14.1 08/03/2020 0817   LYMPHSABS 2.8 08/03/2020 0817   MONOABS 1.2 (H) 08/03/2020 0817   EOSABS 0.1 08/03/2020 0817   BASOSABS 0.1 08/03/2020 0817    CMP     Component Value Date/Time   NA 141 08/03/2020 0817   K 3.6 08/03/2020 0817   CL 105 08/03/2020 0817   CO2 26 08/03/2020 0817   GLUCOSE 99 08/03/2020 0817   BUN 15 08/03/2020 0817   CREATININE 1.05 (H) 08/03/2020 0817   CALCIUM 9.5 08/03/2020 0817   PROT 7.3 06/22/2019 2101   ALBUMIN 4.3 06/22/2019 2101   AST 31 06/22/2019 2101   ALT 18 06/22/2019 2101   ALKPHOS 76 06/22/2019 2101   BILITOT 1.5 (H) 06/22/2019 2101   GFRNONAA 54 (L) 08/03/2020 0817   GFRAA 54 (L) 11/30/2019 0927    Imaging CT pelvis and hip-no fractures  Assessment: 80 yo who had 2 falls (really just sliding to her buttocks) this week and given her pain and inability to walk as well with walker, she came to ED via EMS. Her xrays are negative. Her neuropathy is chronic. NP thinks her leg weakness is related to her pain at the present time and not indicative of an acute condition, although, NP recommends out patient visit soon with NSU. There are no signs of MG exacerbation on exam  today. Patient is very adherent with her medications. She has another appointment with Dr Posey Pronto in May, and NP does not think she needs to be seen any sooner than that. NP sees that PT evaluation has been ordered for discharge disposition.   Recommendations: -OK to d/c home from neuro standpoint. -Keep appointment already scheduled with out patient neuro, Dr. Posey Pronto.  -continue Prednisone 10 mg po qd.  -Continue Mestinon 60 mg tid at 0700, noon, and 1700 hours.  -f/up with Dr. Ronnald Ramp, NSU, asap.   Pt seen by Clance Boll, NP/Neuro. Exam and plan discussed and approved with Dr. Quinn Axe.   Pager: [phone]  Patient was seen and examined by me. Note edited to reflect my findings and recommendations. No e/o MG exacerbation; f/u with Dr. Loura Back, MD [facility] [phone]  If [place], please page neurology on call as listed in [place].

## 2020-08-04 DIAGNOSIS — M25552 Pain in left hip: Secondary | ICD-10-CM | POA: Diagnosis not present

## 2020-08-04 MED ORDER — ALPRAZOLAM 0.5 MG PO TABS: 0.5000 mg | ORAL_TABLET | Freq: Every evening | ORAL | 0 refills | Status: DC | PRN

## 2020-08-04 NOTE — Progress Notes (Signed)
Patient accepted at [facility]. Patient going to room 1204.

## 2020-08-04 NOTE — Progress Notes (Signed)
CSW spoke with patient and son via phone. Patient has decided to go to [facility] for rehab. CSW reached out to admissions and left a message requesting a call back.

## 2020-08-04 NOTE — Progress Notes (Signed)
CSW spoke with patients son and asked if he was going to be at the hospital today. Patients son Leroy Sea stated he would be there between 10-10:30. CSW stated she would call him around 10:30 so CSW could speak with his mother about rehab. CSW sent patients son a list of SNF's that have accepted patient.

## 2020-08-05 DIAGNOSIS — R296 Repeated falls: Secondary | ICD-10-CM | POA: Diagnosis not present

## 2020-08-05 DIAGNOSIS — R2689 Other abnormalities of gait and mobility: Secondary | ICD-10-CM | POA: Diagnosis not present

## 2020-08-05 DIAGNOSIS — R262 Difficulty in walking, not elsewhere classified: Secondary | ICD-10-CM | POA: Diagnosis not present

## 2020-08-05 DIAGNOSIS — R52 Pain, unspecified: Secondary | ICD-10-CM | POA: Diagnosis not present

## 2020-08-05 DIAGNOSIS — M810 Age-related osteoporosis without current pathological fracture: Secondary | ICD-10-CM | POA: Diagnosis not present

## 2020-08-05 DIAGNOSIS — M2550 Pain in unspecified joint: Secondary | ICD-10-CM | POA: Diagnosis not present

## 2020-08-05 DIAGNOSIS — E782 Mixed hyperlipidemia: Secondary | ICD-10-CM | POA: Diagnosis not present

## 2020-08-05 DIAGNOSIS — G629 Polyneuropathy, unspecified: Secondary | ICD-10-CM | POA: Diagnosis not present

## 2020-08-05 DIAGNOSIS — G6289 Other specified polyneuropathies: Secondary | ICD-10-CM | POA: Diagnosis not present

## 2020-08-05 DIAGNOSIS — I10 Essential (primary) hypertension: Secondary | ICD-10-CM | POA: Diagnosis not present

## 2020-08-05 DIAGNOSIS — Z9189 Other specified personal risk factors, not elsewhere classified: Secondary | ICD-10-CM | POA: Diagnosis not present

## 2020-08-05 DIAGNOSIS — M5136 Other intervertebral disc degeneration, lumbar region: Secondary | ICD-10-CM | POA: Diagnosis not present

## 2020-08-05 DIAGNOSIS — Z9181 History of falling: Secondary | ICD-10-CM | POA: Diagnosis not present

## 2020-08-05 DIAGNOSIS — E039 Hypothyroidism, unspecified: Secondary | ICD-10-CM | POA: Diagnosis not present

## 2020-08-05 DIAGNOSIS — M5416 Radiculopathy, lumbar region: Secondary | ICD-10-CM | POA: Diagnosis not present

## 2020-08-05 DIAGNOSIS — Z7401 Bed confinement status: Secondary | ICD-10-CM | POA: Diagnosis not present

## 2020-08-05 DIAGNOSIS — I129 Hypertensive chronic kidney disease with stage 1 through stage 4 chronic kidney disease, or unspecified chronic kidney disease: Secondary | ICD-10-CM | POA: Diagnosis not present

## 2020-08-05 DIAGNOSIS — R2681 Unsteadiness on feet: Secondary | ICD-10-CM | POA: Diagnosis not present

## 2020-08-05 DIAGNOSIS — N189 Chronic kidney disease, unspecified: Secondary | ICD-10-CM | POA: Diagnosis not present

## 2020-08-05 DIAGNOSIS — R1312 Dysphagia, oropharyngeal phase: Secondary | ICD-10-CM | POA: Diagnosis not present

## 2020-08-05 DIAGNOSIS — K219 Gastro-esophageal reflux disease without esophagitis: Secondary | ICD-10-CM | POA: Diagnosis not present

## 2020-08-05 DIAGNOSIS — F411 Generalized anxiety disorder: Secondary | ICD-10-CM | POA: Diagnosis not present

## 2020-08-05 DIAGNOSIS — Z20822 Contact with and (suspected) exposure to covid-19: Secondary | ICD-10-CM | POA: Diagnosis not present

## 2020-08-05 DIAGNOSIS — F331 Major depressive disorder, recurrent, moderate: Secondary | ICD-10-CM | POA: Diagnosis not present

## 2020-08-05 DIAGNOSIS — M545 Low back pain, unspecified: Secondary | ICD-10-CM | POA: Diagnosis not present

## 2020-08-05 DIAGNOSIS — W1789XA Other fall from one level to another, initial encounter: Secondary | ICD-10-CM | POA: Diagnosis not present

## 2020-08-05 DIAGNOSIS — G7000 Myasthenia gravis without (acute) exacerbation: Secondary | ICD-10-CM | POA: Diagnosis not present

## 2020-08-05 DIAGNOSIS — F4320 Adjustment disorder, unspecified: Secondary | ICD-10-CM | POA: Diagnosis not present

## 2020-08-05 DIAGNOSIS — R41841 Cognitive communication deficit: Secondary | ICD-10-CM | POA: Diagnosis not present

## 2020-08-05 DIAGNOSIS — M25552 Pain in left hip: Secondary | ICD-10-CM | POA: Diagnosis not present

## 2020-08-05 DIAGNOSIS — G7001 Myasthenia gravis with (acute) exacerbation: Secondary | ICD-10-CM | POA: Diagnosis not present

## 2020-08-05 DIAGNOSIS — M7918 Myalgia, other site: Secondary | ICD-10-CM | POA: Diagnosis not present

## 2020-08-05 DIAGNOSIS — M4716 Other spondylosis with myelopathy, lumbar region: Secondary | ICD-10-CM | POA: Diagnosis not present

## 2020-08-05 DIAGNOSIS — M6281 Muscle weakness (generalized): Secondary | ICD-10-CM | POA: Diagnosis not present

## 2020-08-09 ENCOUNTER — Other Ambulatory Visit: Payer: Self-pay | Admitting: *Deleted

## 2020-08-09 ENCOUNTER — Telehealth: Payer: Self-pay | Admitting: Neurology

## 2020-08-09 DIAGNOSIS — M7918 Myalgia, other site: Secondary | ICD-10-CM | POA: Diagnosis not present

## 2020-08-09 DIAGNOSIS — G6289 Other specified polyneuropathies: Secondary | ICD-10-CM | POA: Diagnosis not present

## 2020-08-09 DIAGNOSIS — G7000 Myasthenia gravis without (acute) exacerbation: Secondary | ICD-10-CM | POA: Diagnosis not present

## 2020-08-09 DIAGNOSIS — Z9189 Other specified personal risk factors, not elsewhere classified: Secondary | ICD-10-CM | POA: Diagnosis not present

## 2020-08-09 NOTE — Telephone Encounter (Signed)
Called Brad and informed him that patients appt is on 08/12/20 at 11:10am with Dr. Posey Pronto.

## 2020-08-09 NOTE — Telephone Encounter (Signed)
Mestinon would not cause weakness.  I would like to evaluate her in the office for leg weakness.  OK to add to NP appt this Friday.

## 2020-08-09 NOTE — Telephone Encounter (Signed)
Called and spoke to patients son and informed him of Dr. Serita Grit recommendations. Patients son would like his mom to be put in that appt slot and he will call her facility to inform.   Need to call patients son back with appt time once booked.

## 2020-08-09 NOTE — Telephone Encounter (Signed)
08/12/20 at 11:10AM with Dr. Posey Pronto

## 2020-08-09 NOTE — Telephone Encounter (Signed)
Patients son called and has some worries about his mother and she has not been having a good week. Patient is experiencing leg weakness. Patients son stated that patient fell a week ago on Sunday. After the fall patient couldn't get out of bed and had complaints of pain in her butt. Patient went to the ER and x-ray were done and nothing wrong was found.  Patients son stated that his mom has been doing some research online and thinks maybe her medication for her thyroid and her mestinon are causing her legs to be weak?    Patients son is worried and wanted to reach out to [place] for some advice. Informed patients son that I would send Dr. Posey Pronto a message and give him a call once I hear back.

## 2020-08-09 NOTE — Patient Outreach (Signed)
Member screened for potential [facility] Care Management needs. Per [place] (Patient Pearletha Forge) member resides in [facility].   Communication sent to SNF SW to inquire about anticipated transition plans.  Will continue to follow for potential [facility] Care Management services while member resides in SNF.   Marthenia Rolling, MSN, RN,BSN [place] Acute Care Coordinator [phone] [facility]) [phone]  (Toll free office)

## 2020-08-09 NOTE — Telephone Encounter (Signed)
New message    Pt c/o medication issue:  1. Name of Medication: pyridostigmine (MESTINON) 60 MG tablet  2. How are you currently taking this medication (dosage and times per day)? Three time a day   3. Are you having a reaction (difficulty breathing--STAT)? No   4. What is your medication issue? Recent fall  in rehab now   have general questions with her other medication may be causing her weakness.

## 2020-08-12 ENCOUNTER — Other Ambulatory Visit: Payer: Self-pay

## 2020-08-12 ENCOUNTER — Encounter: Payer: Self-pay | Admitting: Neurology

## 2020-08-12 ENCOUNTER — Ambulatory Visit (INDEPENDENT_AMBULATORY_CARE_PROVIDER_SITE_OTHER): Payer: Medicare Other | Admitting: Neurology

## 2020-08-12 VITALS — BP 132/88 | HR 77

## 2020-08-12 DIAGNOSIS — G629 Polyneuropathy, unspecified: Secondary | ICD-10-CM | POA: Diagnosis not present

## 2020-08-12 DIAGNOSIS — M4716 Other spondylosis with myelopathy, lumbar region: Secondary | ICD-10-CM | POA: Diagnosis not present

## 2020-08-12 DIAGNOSIS — M6281 Muscle weakness (generalized): Secondary | ICD-10-CM | POA: Diagnosis not present

## 2020-08-12 DIAGNOSIS — E782 Mixed hyperlipidemia: Secondary | ICD-10-CM | POA: Diagnosis not present

## 2020-08-12 DIAGNOSIS — I10 Essential (primary) hypertension: Secondary | ICD-10-CM | POA: Diagnosis not present

## 2020-08-12 DIAGNOSIS — R296 Repeated falls: Secondary | ICD-10-CM | POA: Diagnosis not present

## 2020-08-12 DIAGNOSIS — G7000 Myasthenia gravis without (acute) exacerbation: Secondary | ICD-10-CM

## 2020-08-12 DIAGNOSIS — K219 Gastro-esophageal reflux disease without esophagitis: Secondary | ICD-10-CM | POA: Diagnosis not present

## 2020-08-12 DIAGNOSIS — M810 Age-related osteoporosis without current pathological fracture: Secondary | ICD-10-CM | POA: Diagnosis not present

## 2020-08-12 DIAGNOSIS — R2681 Unsteadiness on feet: Secondary | ICD-10-CM | POA: Diagnosis not present

## 2020-08-12 DIAGNOSIS — N189 Chronic kidney disease, unspecified: Secondary | ICD-10-CM | POA: Diagnosis not present

## 2020-08-12 DIAGNOSIS — E039 Hypothyroidism, unspecified: Secondary | ICD-10-CM | POA: Diagnosis not present

## 2020-08-12 DIAGNOSIS — R52 Pain, unspecified: Secondary | ICD-10-CM | POA: Diagnosis not present

## 2020-08-12 DIAGNOSIS — M5136 Other intervertebral disc degeneration, lumbar region: Secondary | ICD-10-CM | POA: Diagnosis not present

## 2020-08-12 MED ORDER — PREDNISONE 5 MG PO TABS: ORAL_TABLET | ORAL | 3 refills | Status: DC

## 2020-08-12 MED ORDER — DULOXETINE HCL 30 MG PO CPEP: 30.0000 mg | ORAL_CAPSULE | Freq: Every day | ORAL | 3 refills | Status: DC

## 2020-08-12 NOTE — Progress Notes (Signed)
Follow-up Visit   Date: 08/12/20   Audrey Cowan MRN: [id number] DOB: 11/30/1940   Interim History: Audrey Cowan is a 80 y.o. right-handed Caucasian female with hypothyroidism, hyperlipidemia, anxiety/CKD stage3, hypertension, and vitamin B12 deficiency returning to the clinic for with new complaints of leg pain and weakness. The patient was accompanied to the clinic by son.  Patient suffered a fall as she slipped out of a rolling chair on 4/24.  She needed help to stand and had some soreness of the buttocks and back, but able to walk with a walker.  Two days later, she developed worsening pain and was unable to get out of the bed.  Son called EMS who transported her to the ER where CT pelvis and plain film of the femur did not show boney fracture.  Since this time, she continues to have severe achy pain in the posterior leg and buttocks region.  She is unable to walk and now at rehab facility.  She needs assistance to stand.    She also continues to be bothered that there is no treatment for her neuropathy.  Her legs from the knees down continue to be numb and tingling.   She denies difficulty swallowing/talking, double vision.  She has been able to reduce her prednisone to 10mg /d, but at rehab center they increased it back to 12.5mg  daily.   Medications:  Current Outpatient Medications on File Prior to Visit  Medication Sig Dispense Refill  . ALPRAZolam (XANAX) 0.5 MG tablet Take 0.5 mg by mouth at bedtime. May take an additional tablet during the day if needed.    . ALPRAZolam (XANAX) 0.5 MG tablet Take 1 tablet (0.5 mg total) by mouth at bedtime as needed for anxiety. 30 tablet 0  . amLODipine (NORVASC) 10 MG tablet Take 1 tablet (10 mg total) by mouth daily. 30 tablet 1  . aspirin EC 81 MG tablet Take 81 mg by mouth every other day. Nighttime med    . atorvastatin (LIPITOR) 20 MG tablet Take 20 mg by mouth at bedtime.     . Cholecalciferol (VITAMIN D) 50 MCG (2000 UT)  tablet Take 2,000 Units by mouth at bedtime.    . furosemide (LASIX) 20 MG tablet Take 10 mg by mouth daily.    Marland Kitchen levothyroxine (SYNTHROID) 88 MCG tablet Take 88 mcg by mouth daily before breakfast.    . predniSONE (DELTASONE) 5 MG tablet Take prednisone 12.5mg  x 1 month, then take 10mg  daily (Patient taking differently: Take 10 mg by mouth every morning. Take prednisone 12.5mg  x 1 month, then take 10mg  daily) 150 tablet 3  . pyridostigmine (MESTINON) 60 MG tablet TAKE 1 TABLET BY MOUTH AT 7AM, 1 TABLET AT NOON AND 1 TABLET AT 5PM (Patient taking differently: Take 60 mg by mouth 3 (three) times daily. TAKE AT 7AM, AT NOON AND AT 5PM) 90 tablet 0  . vitamin B-12 (CYANOCOBALAMIN) 1000 MCG tablet Take 1,000 mcg by mouth once a week. Mondays    . pantoprazole (PROTONIX) 40 MG tablet Take 1 tablet (40 mg total) by mouth daily. 30 tablet 1   No current facility-administered medications on file prior to visit.    Allergies:  Allergies  Allergen Reactions  . Other Other (See Comments)    Pt has Myasthenia gravis so all anesthesia must be pre-approved    Vital Signs:  BP 132/88   Pulse 77   SpO2 95%    Neurological Exam: MENTAL STATUS including orientation to time,  place, person, recent and remote memory, attention span and concentration, language, and fund of knowledge is normal.  Speech is clear, no dysarthria   CRANIAL NERVES:   Normal conjugate, extra-ocular eye movements in all directions of gaze.  No ptosis at baseline or with sustained upgaze.  Oribicular oculi, buccinator, and orbicularis oris 5/5.   Tongue strength 5/5  MOTOR:  Motor strength is 5/5 in the arms and legs, and hip flexion 5/5 bilaterally (pain-limiting).  No fatigability.  No pronator drift.    MSRs:                                           Right        Left brachioradialis 2+  2+  biceps 2+  2+  triceps 2+  2+  patellar 3+  2+  ankle jerk 1+  1+   COORDINATION/GAIT:  Normal finger-to- nose-finger.  Gait not  tested, patient in wheelchair   Data: Labs 06/23/2019:  AChR binding 57.1*, blocking 57*, modulating 59*  Lab Results  Component Value Date   VITAMINB12 1,073 (H) 03/27/2019     IMPRESSION/PLAN: 1.  Seropositive generalized myasthenia gravis without exacerbation (06/2019), thymoma negative.  No evidence of disease manifestation.  Explained to pt that myasthenia does not cause pain  - Reduce prednisone to 10mg  daily  - Continue mestinon 60mg  three times daily (7a, noon, and 5pm)  2.  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  Long discussion with pt that medications do not alleviate numbness or treat the condition.  She is frustrated that there is no treatment for neuropathy  - Start Cymbalta 30mg  daily for pain  3.  Bilateral leg pain s/p fall.  She has some pain limiting weakness in the legs.   - No evidence of fracture on CT pelvis  - With her prior back surgery (s/p L3-4 and L4-5 decompression, I recommend dedicated imaging of the lumbar spine  - She is scheduled to see Dr. Ronnald Ramp her neurosurgeon next week and prefers to pursue tested based on his recommendations  Return to clinic in 3 months  Total time spent reviewing records, interview, history/exam, documentation, and coordination of care on day of encounter:  30 min   Thank you for allowing me to participate in patient's care.  If I can answer any additional questions, I would be pleased to do so.    Sincerely,    Hansika Leaming K. Posey Pronto, DO

## 2020-08-12 NOTE — Patient Instructions (Addendum)
Reduce prednisone to 10mg  daily  Start Cymbalta 30mg  daily  Return to clinic in 3 months

## 2020-08-15 DIAGNOSIS — I10 Essential (primary) hypertension: Secondary | ICD-10-CM | POA: Diagnosis not present

## 2020-08-15 DIAGNOSIS — R296 Repeated falls: Secondary | ICD-10-CM | POA: Diagnosis not present

## 2020-08-15 DIAGNOSIS — R52 Pain, unspecified: Secondary | ICD-10-CM | POA: Diagnosis not present

## 2020-08-15 DIAGNOSIS — K219 Gastro-esophageal reflux disease without esophagitis: Secondary | ICD-10-CM | POA: Diagnosis not present

## 2020-08-15 DIAGNOSIS — E039 Hypothyroidism, unspecified: Secondary | ICD-10-CM | POA: Diagnosis not present

## 2020-08-15 DIAGNOSIS — M5136 Other intervertebral disc degeneration, lumbar region: Secondary | ICD-10-CM | POA: Diagnosis not present

## 2020-08-15 DIAGNOSIS — E782 Mixed hyperlipidemia: Secondary | ICD-10-CM | POA: Diagnosis not present

## 2020-08-15 DIAGNOSIS — R2681 Unsteadiness on feet: Secondary | ICD-10-CM | POA: Diagnosis not present

## 2020-08-15 DIAGNOSIS — N189 Chronic kidney disease, unspecified: Secondary | ICD-10-CM | POA: Diagnosis not present

## 2020-08-15 DIAGNOSIS — M810 Age-related osteoporosis without current pathological fracture: Secondary | ICD-10-CM | POA: Diagnosis not present

## 2020-08-15 DIAGNOSIS — M6281 Muscle weakness (generalized): Secondary | ICD-10-CM | POA: Diagnosis not present

## 2020-08-18 ENCOUNTER — Other Ambulatory Visit (HOSPITAL_COMMUNITY): Payer: Self-pay | Admitting: Neurological Surgery

## 2020-08-18 ENCOUNTER — Other Ambulatory Visit: Payer: Self-pay | Admitting: Neurological Surgery

## 2020-08-18 DIAGNOSIS — M5136 Other intervertebral disc degeneration, lumbar region: Secondary | ICD-10-CM | POA: Diagnosis not present

## 2020-08-18 DIAGNOSIS — E039 Hypothyroidism, unspecified: Secondary | ICD-10-CM | POA: Diagnosis not present

## 2020-08-18 DIAGNOSIS — I10 Essential (primary) hypertension: Secondary | ICD-10-CM | POA: Diagnosis not present

## 2020-08-18 DIAGNOSIS — N189 Chronic kidney disease, unspecified: Secondary | ICD-10-CM | POA: Diagnosis not present

## 2020-08-18 DIAGNOSIS — M5416 Radiculopathy, lumbar region: Secondary | ICD-10-CM | POA: Diagnosis not present

## 2020-08-18 DIAGNOSIS — R2681 Unsteadiness on feet: Secondary | ICD-10-CM | POA: Diagnosis not present

## 2020-08-18 DIAGNOSIS — M6281 Muscle weakness (generalized): Secondary | ICD-10-CM | POA: Diagnosis not present

## 2020-08-18 DIAGNOSIS — E782 Mixed hyperlipidemia: Secondary | ICD-10-CM | POA: Diagnosis not present

## 2020-08-18 DIAGNOSIS — R296 Repeated falls: Secondary | ICD-10-CM | POA: Diagnosis not present

## 2020-08-18 DIAGNOSIS — R52 Pain, unspecified: Secondary | ICD-10-CM | POA: Diagnosis not present

## 2020-08-18 DIAGNOSIS — M810 Age-related osteoporosis without current pathological fracture: Secondary | ICD-10-CM | POA: Diagnosis not present

## 2020-08-18 DIAGNOSIS — K219 Gastro-esophageal reflux disease without esophagitis: Secondary | ICD-10-CM | POA: Diagnosis not present

## 2020-08-22 DIAGNOSIS — R2681 Unsteadiness on feet: Secondary | ICD-10-CM | POA: Diagnosis not present

## 2020-08-22 DIAGNOSIS — E039 Hypothyroidism, unspecified: Secondary | ICD-10-CM | POA: Diagnosis not present

## 2020-08-22 DIAGNOSIS — F331 Major depressive disorder, recurrent, moderate: Secondary | ICD-10-CM | POA: Diagnosis not present

## 2020-08-22 DIAGNOSIS — N189 Chronic kidney disease, unspecified: Secondary | ICD-10-CM | POA: Diagnosis not present

## 2020-08-22 DIAGNOSIS — M6281 Muscle weakness (generalized): Secondary | ICD-10-CM | POA: Diagnosis not present

## 2020-08-22 DIAGNOSIS — E782 Mixed hyperlipidemia: Secondary | ICD-10-CM | POA: Diagnosis not present

## 2020-08-22 DIAGNOSIS — I10 Essential (primary) hypertension: Secondary | ICD-10-CM | POA: Diagnosis not present

## 2020-08-22 DIAGNOSIS — M5136 Other intervertebral disc degeneration, lumbar region: Secondary | ICD-10-CM | POA: Diagnosis not present

## 2020-08-22 DIAGNOSIS — F4320 Adjustment disorder, unspecified: Secondary | ICD-10-CM | POA: Diagnosis not present

## 2020-08-22 DIAGNOSIS — R296 Repeated falls: Secondary | ICD-10-CM | POA: Diagnosis not present

## 2020-08-22 DIAGNOSIS — R52 Pain, unspecified: Secondary | ICD-10-CM | POA: Diagnosis not present

## 2020-08-22 DIAGNOSIS — K219 Gastro-esophageal reflux disease without esophagitis: Secondary | ICD-10-CM | POA: Diagnosis not present

## 2020-08-22 DIAGNOSIS — F411 Generalized anxiety disorder: Secondary | ICD-10-CM | POA: Diagnosis not present

## 2020-08-22 DIAGNOSIS — M810 Age-related osteoporosis without current pathological fracture: Secondary | ICD-10-CM | POA: Diagnosis not present

## 2020-08-25 ENCOUNTER — Other Ambulatory Visit: Payer: Self-pay | Admitting: *Deleted

## 2020-08-25 NOTE — Patient Outreach (Signed)
[facility] Post- Acute Care Coordinator follow up. Ms. Alatorre resides in [facility].  Screened for potential [facility] Care Management needs.  Update received from [place] indicating member wants to be transferred to another SNF for ongoing rehab.    Marthenia Rolling, MSN, RN,BSN [place] Acute Care Coordinator [phone] [facility]) [phone]  (Toll free office)

## 2020-08-26 ENCOUNTER — Ambulatory Visit: Payer: Medicare Other | Admitting: Neurology

## 2020-08-30 ENCOUNTER — Other Ambulatory Visit: Payer: Self-pay | Admitting: Neurology

## 2020-08-30 ENCOUNTER — Ambulatory Visit (HOSPITAL_COMMUNITY)
Admission: RE | Admit: 2020-08-30 | Discharge: 2020-08-30 | Disposition: A | Discharge status: Home / Self Care | Payer: Medicare Other | Source: Ambulatory Visit | Attending: Neurological Surgery | Admitting: Neurological Surgery

## 2020-08-30 ENCOUNTER — Other Ambulatory Visit: Payer: Self-pay

## 2020-08-30 DIAGNOSIS — M545 Low back pain, unspecified: Secondary | ICD-10-CM | POA: Diagnosis not present

## 2020-08-30 DIAGNOSIS — M5416 Radiculopathy, lumbar region: Secondary | ICD-10-CM | POA: Insufficient documentation

## 2020-08-30 IMAGING — MR MR LUMBAR SPINE WO/W CM
5 of 7 series · 29 of 48 positions shown · IV contrast (gadavist)
Comparison: None.

CLINICAL DATA: Prior lumbar surgery. Low back pain radiating into
left leg. Fall with ago.

EXAM:
MRI LUMBAR SPINE WITHOUT AND WITH CONTRAST
TECHNIQUE: Multiplanar and multiecho pulse sequences of the lumbar spine were
obtained without and with intravenous contrast.
CONTRAST:  6mL GADAVIST GADOBUTROL 1 MMOL/ML IV SOLN

[Series 5: T1 · sagittal · 4.0mm · 0.81mm/px · 5 of 17 slices shown (1 of 2)]
[im 1/17]
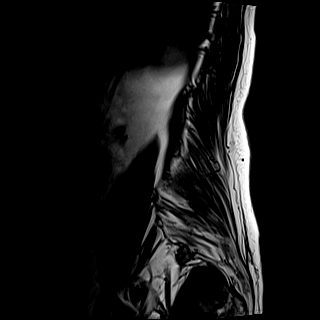
[im 5/17]
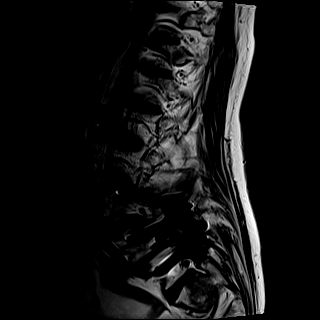
[im 9/17]
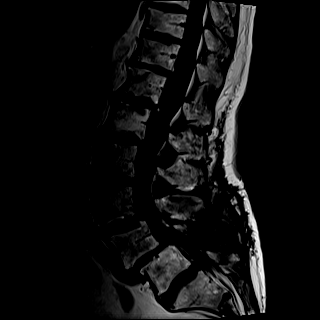
[im 13/17]
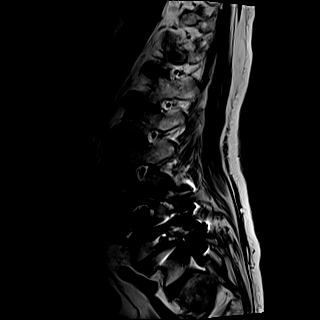
[im 17/17]
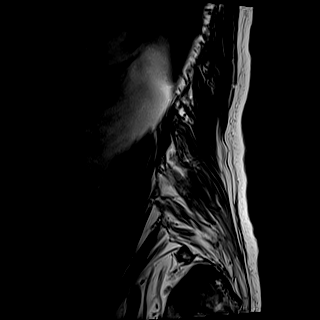

[Series 7: T2 · axial · 4.0mm · 0.62mm/px · z∈[-157,+40]mm · 8 of 42 slices shown]
[im 1/42]
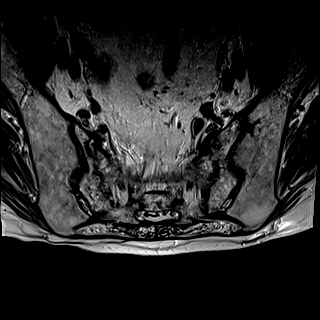
[im 5/42]
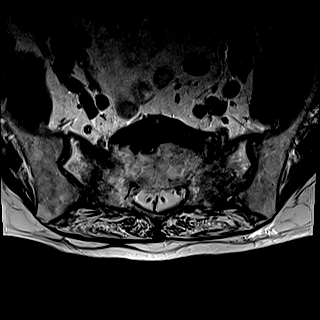
[im 14/42]
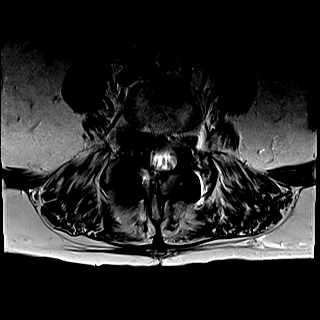
[im 19/42]
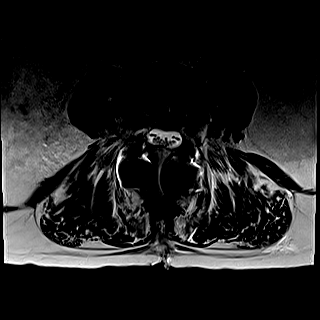
[im 23/42]
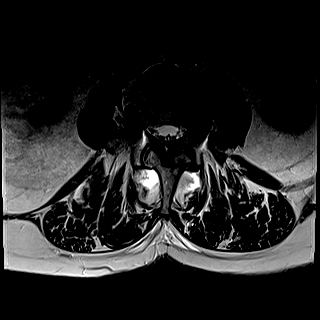
[im 28/42]
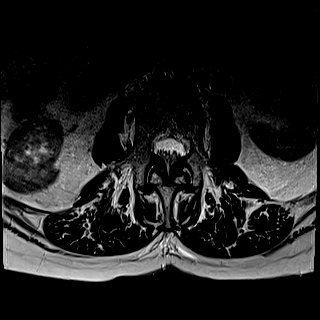
[im 37/42]
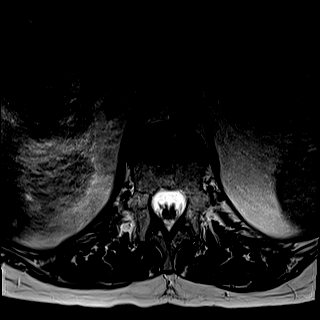
[im 42/42]
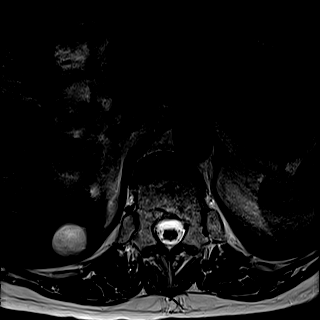

[Series 8: T1 · axial · 4.0mm · 0.39mm/px · z∈[-157,+40]mm · 8 of 42 slices shown (2 of 2)]
[im 1/42]
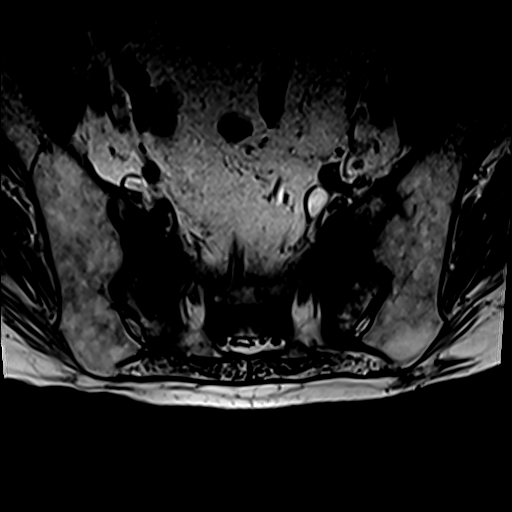
[im 5/42]
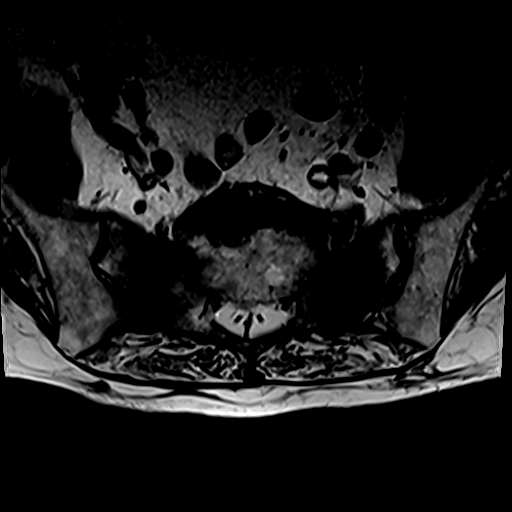
[im 14/42]
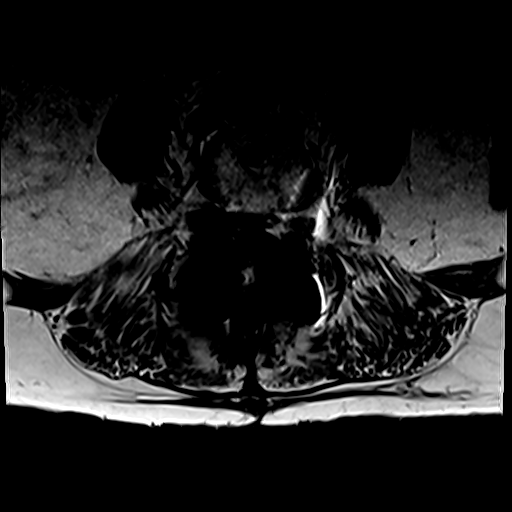
[im 19/42]
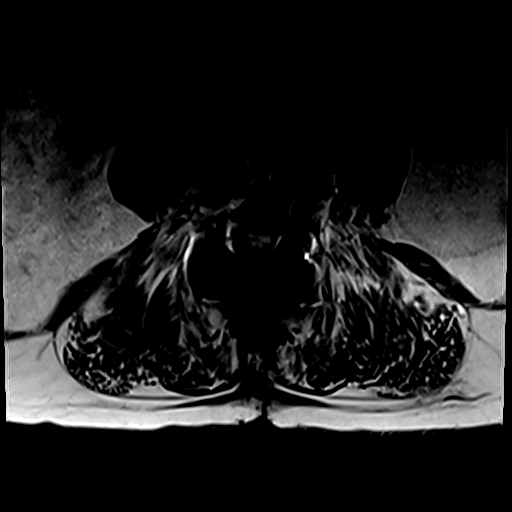
[im 23/42]
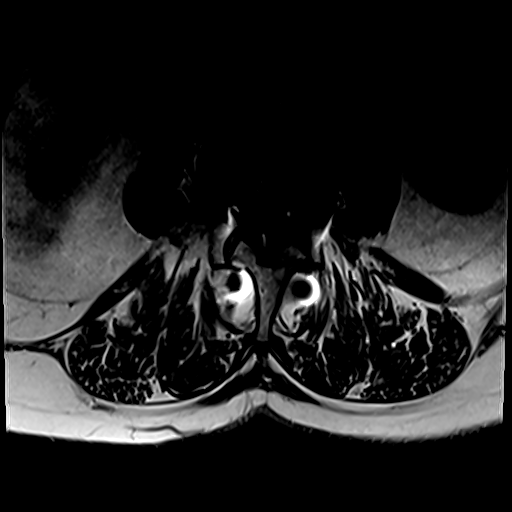
[im 28/42]
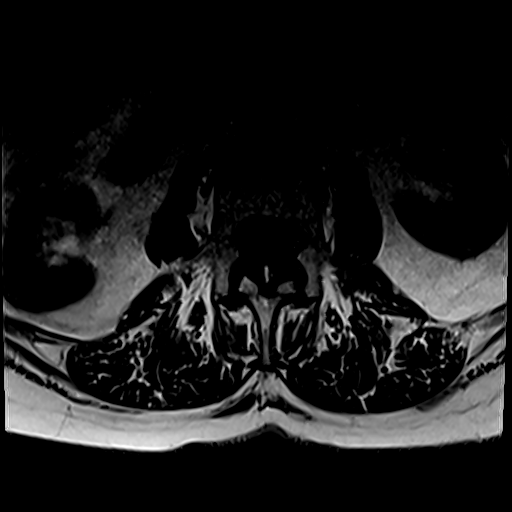
[im 37/42]
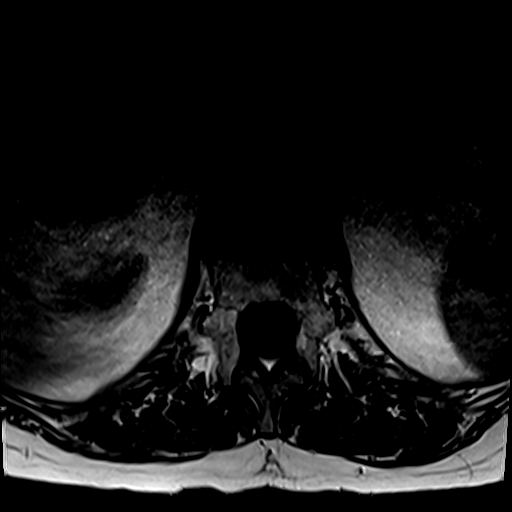
[im 42/42]
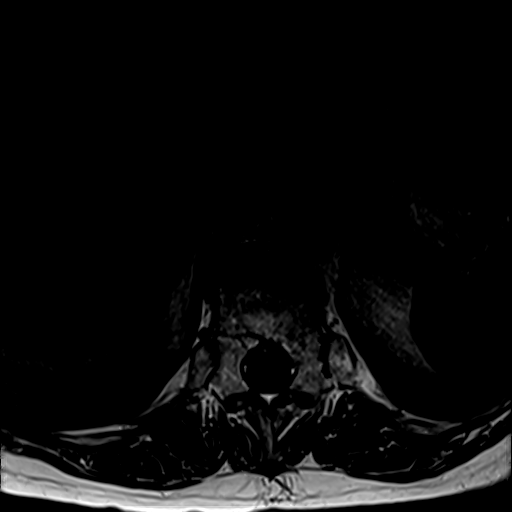

[Series 9: T2 post-contrast · sagittal · 4.0mm · 0.81mm/px · 4 of 17 slices shown]
[im 1/17]
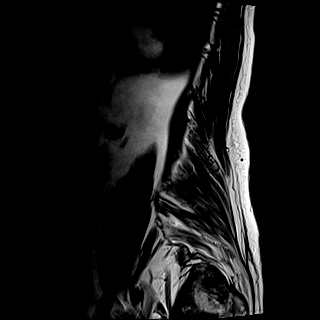
[im 6/17]
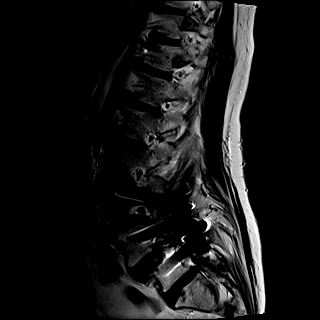
[im 11/17]
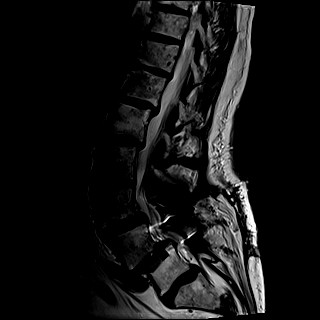
[im 17/17]
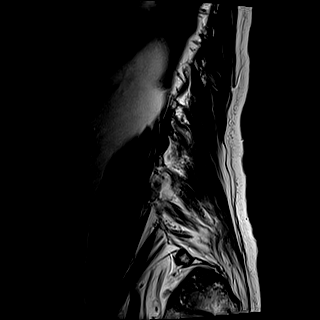

[Series 10: T1 fat-sat post-contrast · sagittal · 4.0mm · 0.81mm/px · 4 of 17 slices shown]
[im 1/17]
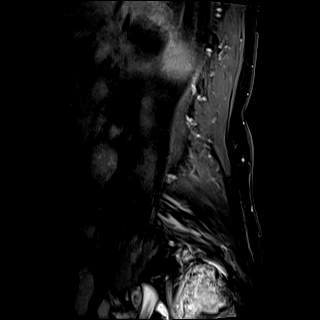
[im 6/17]
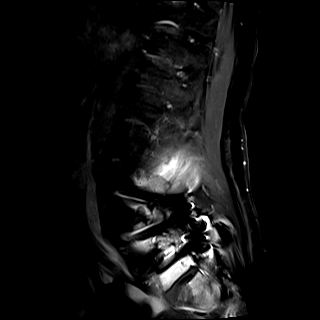
[im 11/17]
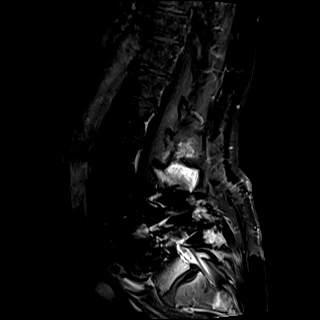
[im 17/17]
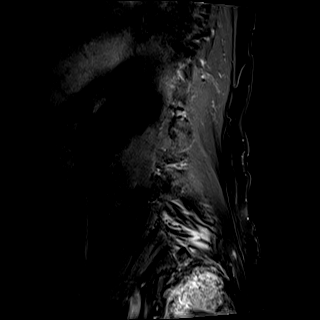

[29 of 48 positions shown; findings below may reference images not displayed]

FINDINGS: Segmentation: Standard segmentation is assumed. The inferior-most
fully formed intervertebral disc is labeled L5-S1. This is
consistent with prior numbering.

Alignment: Similar 8 mm of anterolisthesis of L4 on L5.
Approximately 5 mm of anterolisthesis of L3 on L4, mildly progressed
from prior. Similar slight stepwise retrolisthesis of T12 on L1, L1
on L2, and L2 on L3.

Vertebrae: Partially imaged edema involving bilateral sacral ala
with linear T1 hypointensities through the sacral ala bilaterally,
compatible with acute/unhealed sacral insufficiency fractures.
Vertebral body heights are maintained. Mild edema involving the
inferior S1 vertebral body without height loss, suggesting fracture
given sacral alar fractures. No bone marrow edema in involving the
lumbar vertebral bodies to suggest compression fracture or
discitis/osteomyelitis. No suspicious bone lesions. Redemonstrated
superior L1 endplate degenerative Schmorl's node.

Conus medullaris and cauda equina: Conus extends to the superior L1
level. Conus appears normal.

Paraspinal and other soft tissues: Nonspecific hepatic cysts,
partially imaged.

Disc levels:

T10-T11: Only imaged sagittally. Mild disc bulge without significant
canal or foraminal stenosis.

T11-T12: Mild disc bulge without significant canal or foraminal
stenosis.

T12-L1: Slight retrolisthesis of T12 on L1. Broad disc bulge with
central disc protrusion and annular fissure. No significant canal or
foraminal stenosis.

L1-L2: Slight retrolisthesis of L1 on L2. Broad disc bulge and mild
bilateral facet hypertrophy without significant canal or foraminal
stenosis.

L2-L3: Small bilateral foraminal disc protrusions and mild bilateral
facet hypertrophy without significant canal or foraminal stenosis.

L3-L4: Approximately 5 mm of anterolisthesis of L3 on L4, mildly
progressed from prior. Broad disc bulge with decreased size of the
previously seen left subarticular disc protrusion. Interval
posterior fusion and decompression with improved canal stenosis with
widely patent canal. No significant subarticular recess stenosis.
Amorphous T1 hypointense material in the left lateral canal and
proximal foramen, likely scar tissue. Otherwise, the foramina are
patent with mild narrowing.

L4-L5: Approximately 8 mm of anterolisthesis of L4 on L5, similar to
prior. Uncovering of the disc. Interval posterior fusion and
decompression with improved/patent canal. Bilateral facet
hypertrophy with moderate right and mild left foraminal stenosis,
improved from prior.

L5-S1: Interval posterior fusion. Disc bulge and moderate facet
hypertrophy. Bilateral subarticular recess stenosis with disc
contacting the descending S1 nerve roots. No significant central
canal stenosis or foraminal stenosis.
IMPRESSION: 1. Partially imaged bilateral acute/unhealed sacral insufficiency
fractures and suspected inferior S1 vertebral body fracture without
height loss.
2. Interval L3-L5 posterior fusion with posterior decompression and
improved canal stenosis at L3-L4 and L4-L5. Foraminal stenosis is
also improved with moderate foraminal stenosis on the right at L4-L5
and mild foraminal stenosis bilaterally at L3-L4 and on the left at
L4-L5. Suspected scar tissue in the proximal left L4-L5 foramen.
3. Nonspecific hepatic cysts, partially imaged.

These results will be called to the ordering clinician or
representative by the Radiologist Assistant, and communication
documented in the PACS or [REDACTED].

## 2020-08-30 MED ORDER — GADOBUTROL 1 MMOL/ML IV SOLN
6.0000 mL | Freq: Once | INTRAVENOUS | Status: AC | PRN
  Administered 2020-08-30: 6 mL via INTRAVENOUS

## 2020-08-31 DIAGNOSIS — M533 Sacrococcygeal disorders, not elsewhere classified: Secondary | ICD-10-CM | POA: Insufficient documentation

## 2020-09-01 DIAGNOSIS — R52 Pain, unspecified: Secondary | ICD-10-CM | POA: Diagnosis not present

## 2020-09-01 DIAGNOSIS — R296 Repeated falls: Secondary | ICD-10-CM | POA: Diagnosis not present

## 2020-09-01 DIAGNOSIS — M810 Age-related osteoporosis without current pathological fracture: Secondary | ICD-10-CM | POA: Diagnosis not present

## 2020-09-01 DIAGNOSIS — K219 Gastro-esophageal reflux disease without esophagitis: Secondary | ICD-10-CM | POA: Diagnosis not present

## 2020-09-01 DIAGNOSIS — I10 Essential (primary) hypertension: Secondary | ICD-10-CM | POA: Diagnosis not present

## 2020-09-01 DIAGNOSIS — M5136 Other intervertebral disc degeneration, lumbar region: Secondary | ICD-10-CM | POA: Diagnosis not present

## 2020-09-01 DIAGNOSIS — M6281 Muscle weakness (generalized): Secondary | ICD-10-CM | POA: Diagnosis not present

## 2020-09-01 DIAGNOSIS — E039 Hypothyroidism, unspecified: Secondary | ICD-10-CM | POA: Diagnosis not present

## 2020-09-01 DIAGNOSIS — E782 Mixed hyperlipidemia: Secondary | ICD-10-CM | POA: Diagnosis not present

## 2020-09-01 DIAGNOSIS — N189 Chronic kidney disease, unspecified: Secondary | ICD-10-CM | POA: Diagnosis not present

## 2020-09-01 DIAGNOSIS — R2681 Unsteadiness on feet: Secondary | ICD-10-CM | POA: Diagnosis not present

## 2020-09-04 ENCOUNTER — Other Ambulatory Visit: Payer: Self-pay | Admitting: Neurology

## 2020-09-05 DIAGNOSIS — M5136 Other intervertebral disc degeneration, lumbar region: Secondary | ICD-10-CM | POA: Diagnosis not present

## 2020-09-05 DIAGNOSIS — E782 Mixed hyperlipidemia: Secondary | ICD-10-CM | POA: Diagnosis not present

## 2020-09-05 DIAGNOSIS — R296 Repeated falls: Secondary | ICD-10-CM | POA: Diagnosis not present

## 2020-09-05 DIAGNOSIS — K219 Gastro-esophageal reflux disease without esophagitis: Secondary | ICD-10-CM | POA: Diagnosis not present

## 2020-09-05 DIAGNOSIS — E039 Hypothyroidism, unspecified: Secondary | ICD-10-CM | POA: Diagnosis not present

## 2020-09-05 DIAGNOSIS — R2681 Unsteadiness on feet: Secondary | ICD-10-CM | POA: Diagnosis not present

## 2020-09-05 DIAGNOSIS — I10 Essential (primary) hypertension: Secondary | ICD-10-CM | POA: Diagnosis not present

## 2020-09-05 DIAGNOSIS — M810 Age-related osteoporosis without current pathological fracture: Secondary | ICD-10-CM | POA: Diagnosis not present

## 2020-09-05 DIAGNOSIS — N189 Chronic kidney disease, unspecified: Secondary | ICD-10-CM | POA: Diagnosis not present

## 2020-09-05 DIAGNOSIS — R52 Pain, unspecified: Secondary | ICD-10-CM | POA: Diagnosis not present

## 2020-09-05 DIAGNOSIS — M6281 Muscle weakness (generalized): Secondary | ICD-10-CM | POA: Diagnosis not present

## 2020-09-07 ENCOUNTER — Telehealth (HOSPITAL_COMMUNITY): Payer: Self-pay

## 2020-09-07 ENCOUNTER — Other Ambulatory Visit (HOSPITAL_COMMUNITY): Payer: Self-pay | Admitting: Neuroradiology

## 2020-09-07 NOTE — Telephone Encounter (Signed)
-----   Message from Pedro Earls, MD sent at 09/07/2020 12:31 PM EDT ----- Regarding: RE: bilateral sacroplasty Hi Ash,   You can schedule her.  Thanks, Wendelyn Breslow  ----- Message ----- From: Danielle Dess Sent: 09/07/2020  12:09 PM EDT To: Pedro Earls, MD Subject: bilateral sacroplasty                          Kat,   Please review her MR lumbar done on 5/24 for bilateral sacroplasty. Referral came over this morning from Dr. Ronnald Ramp (Neurosurgery & Spine).   Thanks,  Lia Foyer

## 2020-09-07 NOTE — Telephone Encounter (Signed)
Called to schedule sacroplasty, no answer, left vm. AW 

## 2020-09-08 ENCOUNTER — Other Ambulatory Visit (HOSPITAL_COMMUNITY): Payer: Self-pay | Admitting: Neuroradiology

## 2020-09-08 DIAGNOSIS — W1789XA Other fall from one level to another, initial encounter: Secondary | ICD-10-CM | POA: Diagnosis not present

## 2020-09-08 DIAGNOSIS — M8448XA Pathological fracture, other site, initial encounter for fracture: Secondary | ICD-10-CM

## 2020-09-08 DIAGNOSIS — G6289 Other specified polyneuropathies: Secondary | ICD-10-CM | POA: Diagnosis not present

## 2020-09-08 DIAGNOSIS — I10 Essential (primary) hypertension: Secondary | ICD-10-CM | POA: Diagnosis not present

## 2020-09-09 ENCOUNTER — Ambulatory Visit (HOSPITAL_COMMUNITY)
Admission: RE | Admit: 2020-09-09 | Discharge: 2020-09-09 | Disposition: A | Discharge status: Home / Self Care | Payer: Medicare Other | Source: Ambulatory Visit | Attending: Neuroradiology | Admitting: Neuroradiology

## 2020-09-09 ENCOUNTER — Other Ambulatory Visit: Payer: Self-pay

## 2020-09-09 ENCOUNTER — Encounter (HOSPITAL_COMMUNITY): Payer: Self-pay

## 2020-09-09 DIAGNOSIS — E7800 Pure hypercholesterolemia, unspecified: Secondary | ICD-10-CM | POA: Diagnosis not present

## 2020-09-09 DIAGNOSIS — Z79899 Other long term (current) drug therapy: Secondary | ICD-10-CM | POA: Diagnosis not present

## 2020-09-09 DIAGNOSIS — G7000 Myasthenia gravis without (acute) exacerbation: Secondary | ICD-10-CM | POA: Diagnosis not present

## 2020-09-09 DIAGNOSIS — N189 Chronic kidney disease, unspecified: Secondary | ICD-10-CM | POA: Diagnosis not present

## 2020-09-09 DIAGNOSIS — M8448XA Pathological fracture, other site, initial encounter for fracture: Secondary | ICD-10-CM | POA: Insufficient documentation

## 2020-09-09 DIAGNOSIS — Z7982 Long term (current) use of aspirin: Secondary | ICD-10-CM | POA: Diagnosis not present

## 2020-09-09 DIAGNOSIS — I129 Hypertensive chronic kidney disease with stage 1 through stage 4 chronic kidney disease, or unspecified chronic kidney disease: Secondary | ICD-10-CM | POA: Diagnosis not present

## 2020-09-09 DIAGNOSIS — Z87891 Personal history of nicotine dependence: Secondary | ICD-10-CM | POA: Diagnosis not present

## 2020-09-09 DIAGNOSIS — E039 Hypothyroidism, unspecified: Secondary | ICD-10-CM | POA: Diagnosis not present

## 2020-09-09 DIAGNOSIS — Z79890 Hormone replacement therapy (postmenopausal): Secondary | ICD-10-CM | POA: Diagnosis not present

## 2020-09-09 DIAGNOSIS — W19XXXA Unspecified fall, initial encounter: Secondary | ICD-10-CM | POA: Insufficient documentation

## 2020-09-09 DIAGNOSIS — M4848XA Fatigue fracture of vertebra, sacral and sacrococcygeal region, initial encounter for fracture: Secondary | ICD-10-CM | POA: Diagnosis not present

## 2020-09-09 HISTORY — PX: IR SACROPLASTY BILATERAL: IMG5561

## 2020-09-09 LAB — PROTIME-INR
INR: 0.9 (ref 0.8–1.2)
Prothrombin Time: 12.3 seconds (ref 11.4–15.2)

## 2020-09-09 LAB — CBC
HCT: 44.1 % (ref 36.0–46.0)
Hemoglobin: 14.4 g/dL (ref 12.0–15.0)
MCH: 29.7 pg (ref 26.0–34.0)
MCHC: 32.7 g/dL (ref 30.0–36.0)
MCV: 90.9 fL (ref 80.0–100.0)
Platelets: 330 10*3/uL (ref 150–400)
RBC: 4.85 MIL/uL (ref 3.87–5.11)
RDW: 15.6 % — ABNORMAL HIGH (ref 11.5–15.5)
WBC: 14.9 10*3/uL — ABNORMAL HIGH (ref 4.0–10.5)
nRBC: 0 % (ref 0.0–0.2)

## 2020-09-09 IMAGING — XA IR SACRALPLASTY INJ BILAT
7 of 10 series · 12 of 24 positions shown · non-contrast
Comparison: none

INDICATION: LAFORTUNE is a 79-year-old female with a past medical history
significant for hypertension, hypercholesterolemia, myasthenia
gravis, CKD, and hypothyroidism. In [DATE], patient suffered a fall
and experienced new onset low back pain. MRI of the lumbar spine
performed [DATE] showed bilateral sacral insufficiency
fractures. She comes to our service today for a fluoroscopy guided
sacroplasty for intractable pain related to sacral insufficiency
fracture.
TECHNIQUE: Informed written consent was obtained from the patient after a
thorough discussion of the procedural risks, benefits and
alternatives. All questions were addressed. Maximal Sterile Barrier
Technique was utilized including caps, mask, sterile gowns, sterile
gloves, sterile drape, hand hygiene and skin antiseptic. A timeout
was performed prior to the initiation of the procedure.

[Series 2: fl angio · 2 acquisitions, 1 frame shown (1 of 4)]
[im 1/2]
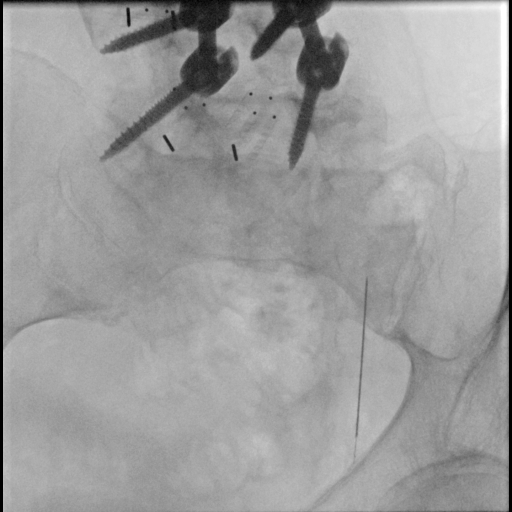

[Series 3: fl angio · 2 acquisitions, 3 frames shown (2 of 4)]
[im 1/2]
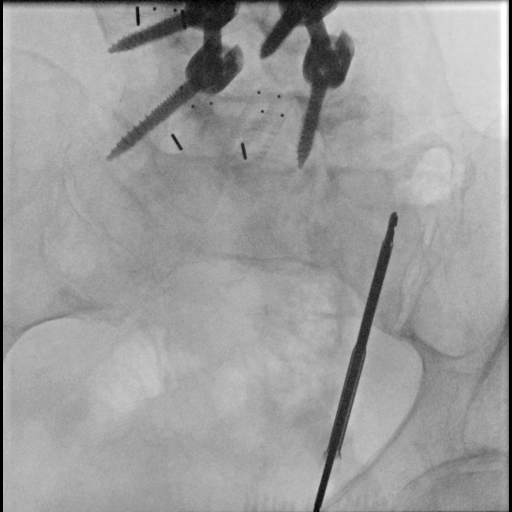
[im 2/2]
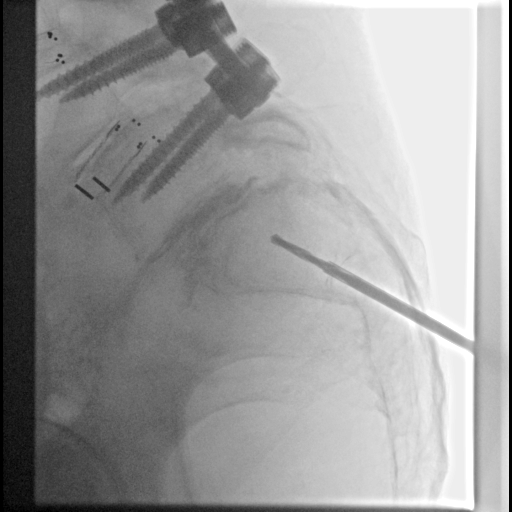
[im 2/2]
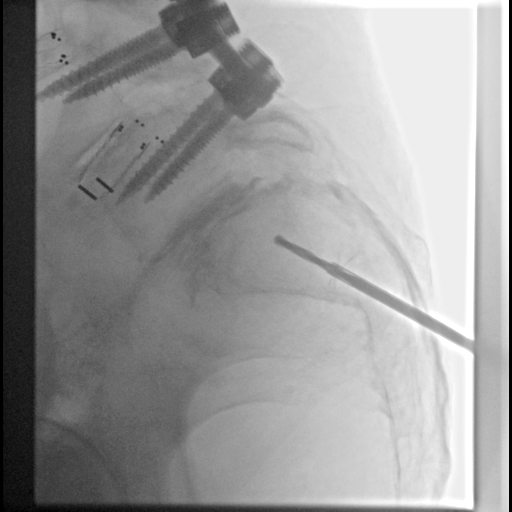

[Series 4: fl angio · 2 acquisitions, 2 frames shown (3 of 4)]
[im 1/2]
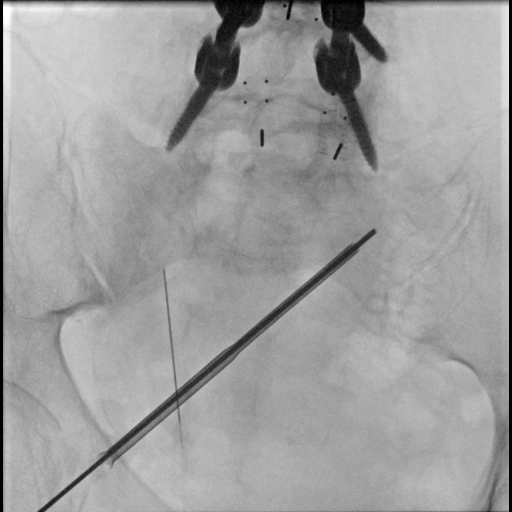
[im 2/2]
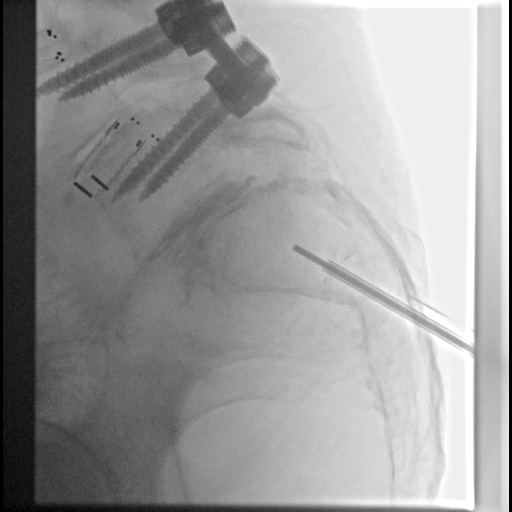

[Series 5: fl angio · 1 of 130 frames shown (4 of 4)]
[frame 66/130]
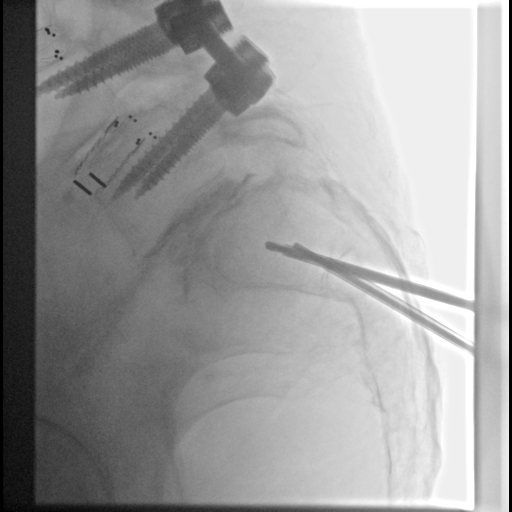

[Series 7: single · 1 of 2 slices shown (1 of 2)]
[im 1/2]
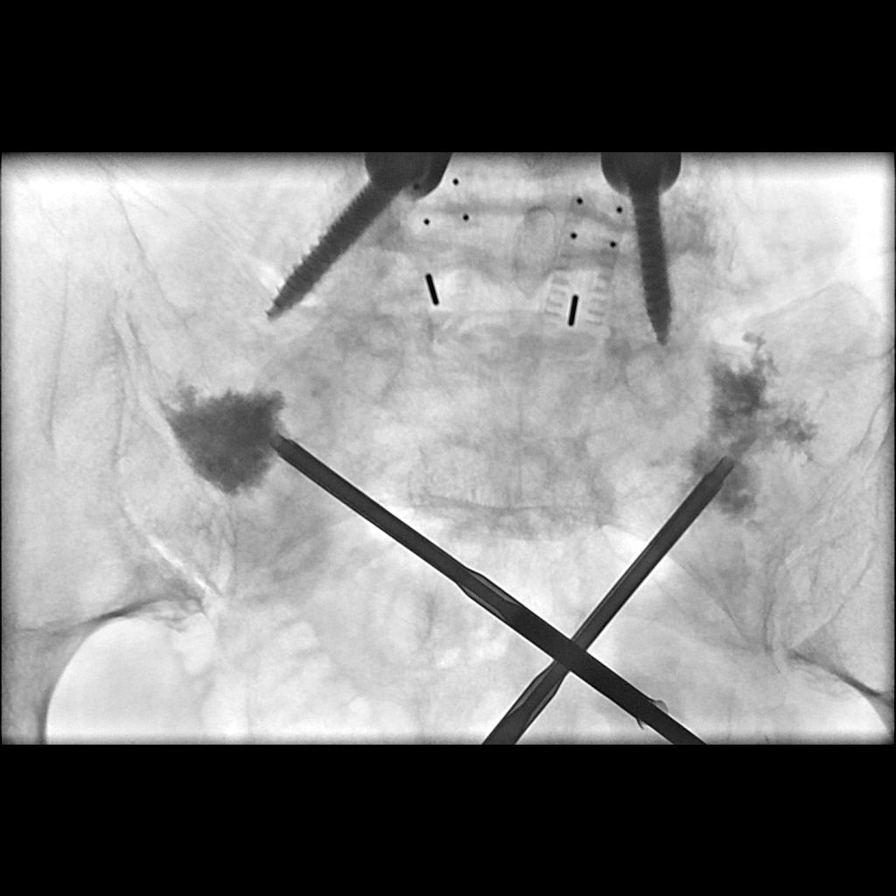

[Series 8: single · 1 of 2 slices shown (2 of 2)]
[im 2/2]
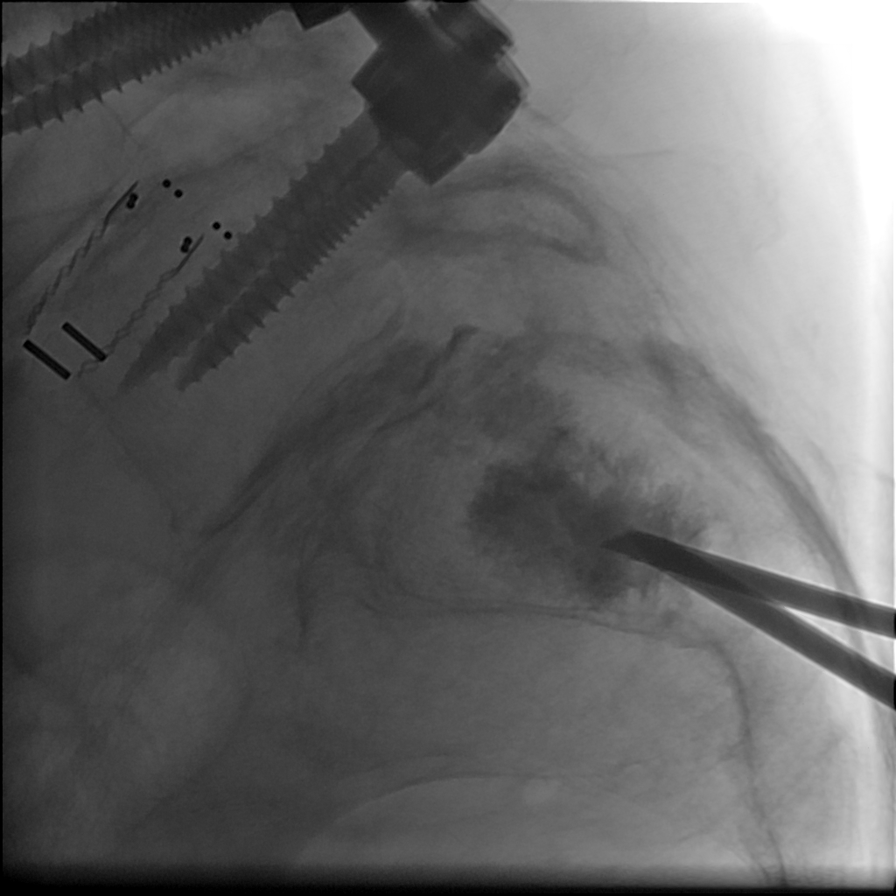

[Series 300: spine · 3 of 11 slices shown]
[im 2/11]
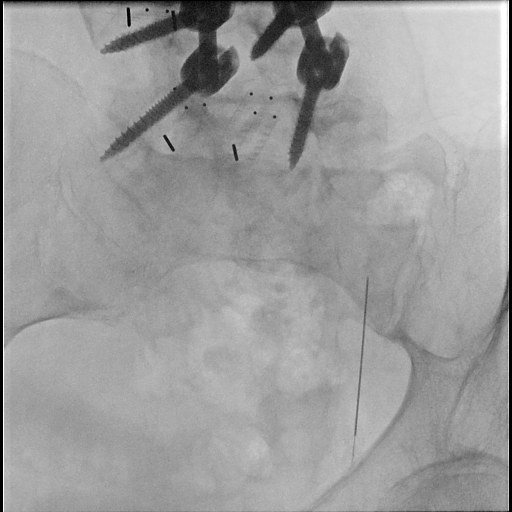
[im 6/11]
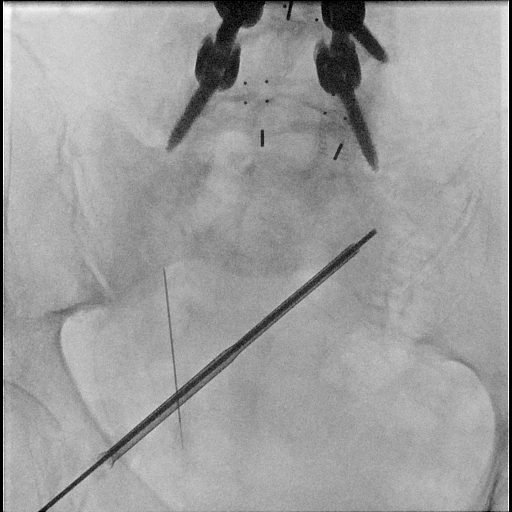
[im 11/11]
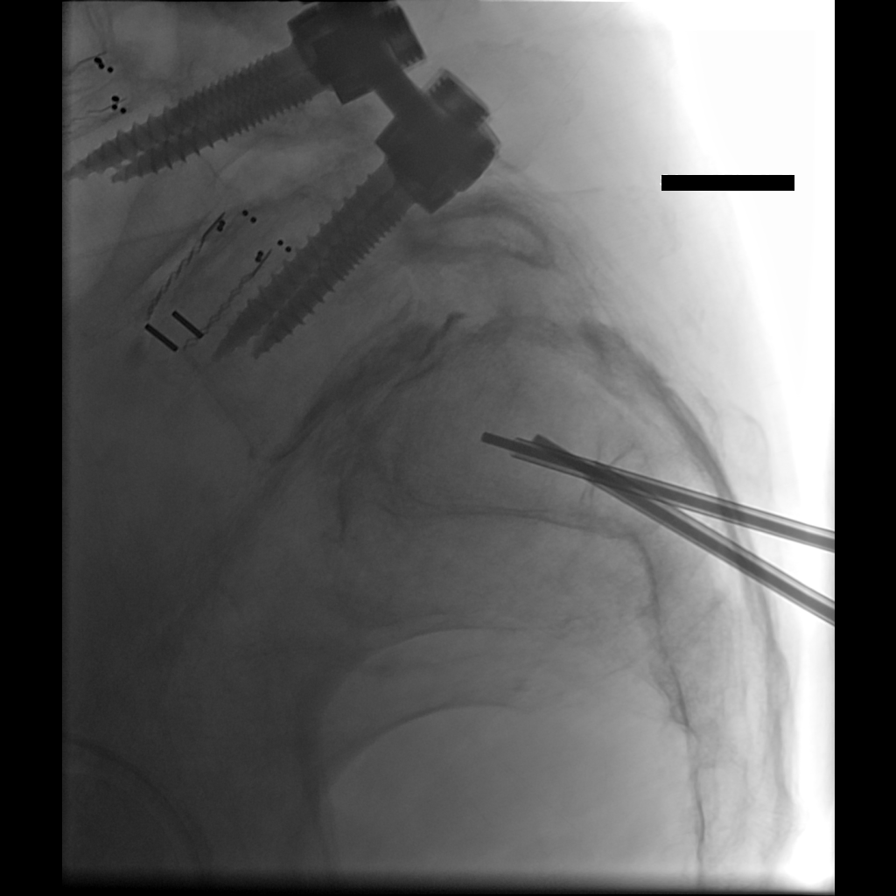

[12 of 24 positions shown; findings below may reference images not displayed]

EXAM:
FLUOROSCOPY GUIDED BILATERAL SACROPLASTY

MEDICATIONS:
As antibiotic prophylaxis, Ancef 2 gm IV was ordered pre-procedure
and administered intravenously within 1 hour of incision.

ANESTHESIA/SEDATION:
Moderate (conscious) sedation was employed during this procedure. A
total of Versed 3 mg and Fentanyl 150 mcg was administered
intravenously.

Moderate Sedation Time: minutes. The patient's level of
consciousness and vital signs were monitored continuously by
radiology nursing throughout the procedure under my direct
supervision.

FLUOROSCOPY TIME:  Fluoroscopy Time:  minutes  seconds ( mGy)

COMPLICATIONS:
None immediate.
PROCEDURE:
The patient was placed in prone position on the angiography table.
The sacral region was prepped and draped in a sterile fashion.

Under fluoroscopy, the right sacral ala was delineated and the skin
area was marked. The skin was infiltrated with a 1% Lidocaine at the
expected entry site. Using a 22-gauge spinal needle, the soft issue
and periosteum were infiltrated with Bupivacaine 0.5%. A skin
incision was made at the access site.

Subsequently, an 11-gauge Kyphon trocar was inserted under
fluoroscopic guidance until contact with the posterior S3 cortex.
The trocar was inserted under light LAFORTUNE on in a
longitudinal fashion along the sacral ala until S2 was reached. The
diamond mandrill was removed.

Under fluoroscopy, the left sacral ala was delineated and the skin
area was marked. The skin was infiltrated with a 1% Lidocaine at the
expected entry site. Using a 22-gauge spinal needle, the soft issue
and periosteum were infiltrated with Bupivacaine 0.5%. A skin
incision was made at the access site.

Subsequently, an 11-gauge Kyphon trocar was inserted under
fluoroscopic guidance until contact with the posterior S3 cortex.
The trocar was inserted under light LAFORTUNE on in a
longitudinal fashion along the sacral ala until S2 was reached. The
diamond mandrill was removed.

A bone drill was coaxially advanced to the S1 level on each side.
Under continuous fluoroscopy guidance in the AP and lateral views,
the bilateral sacral ala were filled with previously mixed

polymethyl-methacrylate (PMMA) added to barium for opacification.
Both cannulas were later removed.

The access sites were cleaned and covered with a sterile bandage.
IMPRESSION: Successful fluoroscopy-guided bilateral sacroplasty for treatment of
sacral insufficiency fractures.

## 2020-09-09 MED ORDER — MIDAZOLAM HCL 2 MG/2ML IJ SOLN
INTRAMUSCULAR | Status: AC
  Filled 2020-09-09: qty 4

## 2020-09-09 MED ORDER — CEFAZOLIN SODIUM-DEXTROSE 2-4 GM/100ML-% IV SOLN
INTRAVENOUS | Status: AC
  Administered 2020-09-09: 2 g via INTRAVENOUS
  Filled 2020-09-09: qty 100

## 2020-09-09 MED ORDER — MIDAZOLAM HCL 2 MG/2ML IJ SOLN
INTRAMUSCULAR | Status: AC | PRN
  Administered 2020-09-09: 0.5 mg via INTRAVENOUS
  Administered 2020-09-09: 1 mg via INTRAVENOUS
  Administered 2020-09-09 (×3): 0.5 mg via INTRAVENOUS

## 2020-09-09 MED ORDER — FENTANYL CITRATE (PF) 100 MCG/2ML IJ SOLN
INTRAMUSCULAR | Status: AC | PRN
  Administered 2020-09-09 (×4): 25 ug via INTRAVENOUS
  Administered 2020-09-09: 50 ug via INTRAVENOUS

## 2020-09-09 MED ORDER — BUPIVACAINE HCL (PF) 0.5 % IJ SOLN
INTRAMUSCULAR | Status: AC
  Filled 2020-09-09: qty 30

## 2020-09-09 MED ORDER — FENTANYL CITRATE (PF) 100 MCG/2ML IJ SOLN
INTRAMUSCULAR | Status: AC
  Filled 2020-09-09: qty 2

## 2020-09-09 MED ORDER — SODIUM CHLORIDE 0.9 % IV SOLN: INTRAVENOUS | Status: DC

## 2020-09-09 MED ORDER — CEFAZOLIN SODIUM-DEXTROSE 2-4 GM/100ML-% IV SOLN: 2.0000 g | Freq: Once | INTRAVENOUS | Status: AC

## 2020-09-09 MED ORDER — LIDOCAINE HCL 1 % IJ SOLN
INTRAMUSCULAR | Status: AC
  Filled 2020-09-09: qty 20

## 2020-09-09 MED ORDER — LIDOCAINE HCL 1 % IJ SOLN
INTRAMUSCULAR | Status: AC | PRN
  Administered 2020-09-09: 10 mL

## 2020-09-09 NOTE — Sedation Documentation (Signed)
Transferred patient from IR table to stretcher without incident.

## 2020-09-09 NOTE — Discharge Instructions (Signed)
Balloon Kyphoplasty, Care After This sheet gives you information about how to care for yourself after your procedure. Your health care provider may also give you more specific instructions. If you have problems or questions, contact your health care provider. What can I expect after the procedure? After the procedure, it is common to have back pain. Follow these instructions at home: Medicines  Take over-the-counter and prescription medicines only as told by your health care provider.  Ask your health care provider if the medicine prescribed to you: ? Requires you to avoid driving or using machinery. ? Can cause constipation. You may need to take these actions to prevent or treat constipation:  Drink enough fluid to keep your urine pale yellow.  Take over-the-counter or prescription medicines.  Eat foods that are high in fiber, such as beans, whole grains, and fresh fruits and vegetables.  Limit foods that are high in fat and processed sugars, such as fried or sweet foods. Puncture site care  Follow instructions from your health care provider about how to take care of your puncture site. Make sure you: ? Wash your hands with soap and water for at least 20 seconds before and after you change your bandage (dressing). If soap and water are not available, use hand sanitizer. ? Change your dressing as told by your health care provider. ? Leave skin glue or adhesive strips in place. These skin closures may need to be in place for 2 weeks or longer. If adhesive strip edges start to loosen and curl up, you may trim the loose edges. Do not remove adhesive strips completely unless your health care provider tells you to do that.  Check your puncture site every day for signs of infection. Check for: ? Redness, swelling, or pain. ? Fluid or blood. ? Warmth. ? Pus or a bad smell.  Keep your dressing dry until your health care provider says that it can be removed.   Managing pain, stiffness, and  swelling If directed, put ice on the painful area. To do this:  Put ice in a plastic bag.  Place a towel between your skin and the bag.  Leave the ice on for 20 minutes, 2-3 times a day.  Remove the ice if your skin turns bright red. This is very important. If you cannot feel pain, heat, or cold, you have a greater risk of damage to the area.   Activity  Rest your back and avoid intense physical activity for as long as told by your health care provider.  Avoid bending, lifting, or twisting your back for as long as told by your health care provider.  Return to your normal activities as told by your health care provider. Ask your health care provider what activities are safe for you.  Do not lift anything that is heavier than 5 lb (2.2 kg). You may need to avoid heavy lifting for several weeks. General instructions  Do not use any products that contain nicotine or tobacco, such as cigarettes, e-cigarettes, and chewing tobacco. These can delay bone healing. If you need help quitting, ask your health care provider.  If you were given a sedative during the procedure, it can affect you for several hours. Do not drive or operate machinery until your health care provider says that it is safe.  Keep all follow-up visits. This is important. Contact a health care provider if:  You have a fever or chills.  You have redness, swelling, or pain at the site of your puncture.  You have fluid, blood, or pus coming from the puncture site.  You have pain that gets worse or does not get better with medicine.  You develop numbness or weakness in any part of your body. Get help right away if:  You have chest pain.  You have difficulty breathing.  You have weakness, numbness, or tingling in your legs.  You cannot control your bladder or bowel movements (incontinence).  You suddenly become weak or numb on one side of your body.  You become very confused.  You have trouble speaking or  understanding, or both. These symptoms may represent a serious problem that is an emergency. Do not wait to see if the symptoms will go away. Get medical help right away. Call your local emergency services (911 in the [place] Do not drive yourself to the hospital. Summary  Follow instructions from your health care provider about how to take care of your puncture site.  Take over-the-counter and prescription medicines only as told by your health care provider.  Rest your back and avoid intense physical activity for as long as told by your health care provider.  Contact a health care provider if you have pain that gets worse or does not get better with medicine.  Keep all follow-up visits. This is important. This information is not intended to replace advice given to you by your health care provider. Make sure you discuss any questions you have with your health care provider. Document Revised: 07/15/2019 Document Reviewed: 07/15/2019 Elsevier Patient Education  2021 [employer].

## 2020-09-09 NOTE — H&P (Signed)
Chief Complaint: Patient was seen in consultation today for sacral fracture/bilateral sacroplasty.  Referring Physician(s): Eustace Moore (neurosurgery)  Supervising Physician: Pedro Earls  Patient Status: [facility] - Out-pt  History of Present Illness: Audrey Cowan is a 80 y.o. female with a past medical history of hypertension, hypercholesterolemia, myasthenia gravis, CKD, and hypothyroidism. In 08/2020, patient suffered a fall and experienced new onset low back pain. She was seen by her neurosurgeon for further management who ordered appropriate imaging scans for further evaluation.  MR lumbar 08/30/2020: 1. Partially imaged bilateral acute/unhealed sacral insufficiency fractures and suspected inferior S1 vertebral body fracture without height loss. 2. Interval L3-L5 posterior fusion with posterior decompression and improved canal stenosis at L3-L4 and L4-L5. Foraminal stenosis is also improved with moderate foraminal stenosis on the right at L4-L5 and mild foraminal stenosis bilaterally at L3-L4 and on the left at L4-L5. Suspected scar tissue in the proximal left L4-L5 foramen. 3. Nonspecific hepatic cysts, partially imaged.  NIR consulted by Dr. Ronnald Ramp for possible image-guided bilateral sacroplasty. Patient awake and alert laying in bed. Complains of low back/buttock pain, rated 8/10 at this time. Denies fever, chills, chest pain, dyspnea, abdominal pain, or headache.   Past Medical History:  Diagnosis Date  . Chronic kidney disease    CKD  . Complication of anesthesia    Myasthenia Gravis  . Hypercholesteremia   . Hypertension   . Hypothyroidism   . Myasthenia gravis ([place])   . Trimalleolar fracture    RIGHT ANKLE    Past Surgical History:  Procedure Laterality Date  . colonoscopy    . EYE SURGERY Left    cataracts  . ORIF ANKLE FRACTURE Right 10/09/2018   Procedure: OPEN REDUCTION INTERNAL FIXATION (ORIF) Right ankle trimalleolar fracture;  Surgeon:  Wylene Simmer, MD;  Location: [place];  Service: Orthopedics;  Laterality: Right;  63min    Allergies: Other  Medications: Prior to Admission medications   Medication Sig Start Date End Date Taking? Authorizing Provider  ALPRAZolam Duanne Moron) 0.5 MG tablet Take 0.5 mg by mouth at bedtime. May take an additional tablet during the day if needed. 06/29/20   [provider]  ALPRAZolam Duanne Moron) 0.5 MG tablet Take 1 tablet (0.5 mg total) by mouth at bedtime as needed for anxiety. 08/04/20   Charlesetta Shanks, MD  amLODipine (NORVASC) 10 MG tablet Take 1 tablet (10 mg total) by mouth daily. 07/04/19   Shelly Coss, MD  aspirin EC 81 MG tablet Take 81 mg by mouth every other day. Nighttime med    [provider]  atorvastatin (LIPITOR) 20 MG tablet Take 20 mg by mouth at bedtime.     [provider]  Cholecalciferol (VITAMIN D) 50 MCG (2000 UT) tablet Take 2,000 Units by mouth at bedtime.    [provider]  DULoxetine (CYMBALTA) 30 MG capsule Take 1 capsule (30 mg total) by mouth daily. 08/12/20   Narda Amber K, DO  furosemide (LASIX) 20 MG tablet Take 10 mg by mouth daily.    [provider]  levothyroxine (SYNTHROID) 88 MCG tablet Take 88 mcg by mouth daily before breakfast.    [provider]  pantoprazole (PROTONIX) 40 MG tablet Take 1 tablet (40 mg total) by mouth daily. 07/04/19 07/03/20  Shelly Coss, MD  predniSONE (DELTASONE) 5 MG tablet TAKE 3 TABLETS BY MOUTH EVERY DAY 09/06/20   Patel, Donika K, DO  pyridostigmine (MESTINON) 60 MG tablet TAKE 1 TABLET BY MOUTH AT 7AM,  1 TABLET AT NOON AND 1 TABLET AT 5PM 08/30/20   Narda Amber K, DO  vitamin B-12 (CYANOCOBALAMIN) 1000 MCG tablet Take 1,000 mcg by mouth once a week. Mondays    [provider]     Family History  Problem Relation Age of Onset  . Leukemia Sister     Social History   Socioeconomic History  . Marital status: Divorced    Spouse name: Not on  file  . Number of children: Not on file  . Years of education: Not on file  . Highest education level: Not on file  Occupational History  . Not on file  Tobacco Use  . Smoking status: Former Smoker    Types: Cigarettes    Quit date: 1970    Years since quitting: 52.4  . Smokeless tobacco: Never Used  Vaping Use  . Vaping Use: Never used  Substance and Sexual Activity  . Alcohol use: Not Currently  . Drug use: Never  . Sexual activity: Not on file  Other Topics Concern  . Not on file  Social History Narrative   Single story home with son   Right handed   Drinks occasional coke soda.       Mainly drinks crystal light and water   Social Determinants of Health   Financial Resource Strain: Not on file  Food Insecurity: Not on file  Transportation Needs: Not on file  Physical Activity: Not on file  Stress: Not on file  Social Connections: Not on file     Review of Systems: A 12 point ROS discussed and pertinent positives are indicated in the HPI above.  All other systems are negative.  Review of Systems  Constitutional: Negative for chills and fever.  Respiratory: Negative for shortness of breath and wheezing.   Cardiovascular: Negative for chest pain and palpitations.  Gastrointestinal: Negative for abdominal pain.  Musculoskeletal: Positive for back pain.  Neurological: Negative for headaches.  Psychiatric/Behavioral: Negative for behavioral problems and confusion.    Vital Signs: BP (!) 160/95   Pulse (!) 107   Temp 98 F (36.7 C) (Oral)   Resp 18   Ht 5' 5.5" (1.664 m)   Wt 149 lb (67.6 kg)   SpO2 96%   BMI 24.42 kg/m   Physical Exam Vitals and nursing note reviewed.  Constitutional:      General: She is not in acute distress. Cardiovascular:     Rate and Rhythm: Regular rhythm. Tachycardia present.     Heart sounds: Normal heart sounds. No murmur heard.   Pulmonary:     Effort: Pulmonary effort is normal. No respiratory distress.     Breath  sounds: Normal breath sounds. No wheezing.  Skin:    General: Skin is warm and dry.  Neurological:     Mental Status: She is alert and oriented to person, place, and time.      MD Evaluation Airway: WNL Heart: WNL Abdomen: WNL Chest/ Lungs: WNL ASA  Classification: 3 Mallampati/Airway Score: Two   Imaging: MR Lumbar Spine W Wo Contrast  Result Date: 08/30/2020 CLINICAL DATA:  Prior lumbar surgery. Low back pain radiating into left leg. Fall with ago. EXAM: MRI LUMBAR SPINE WITHOUT AND WITH CONTRAST TECHNIQUE: Multiplanar and multiecho pulse sequences of the lumbar spine were obtained without and with intravenous contrast. CONTRAST:  41mL GADAVIST GADOBUTROL 1 MMOL/ML IV SOLN COMPARISON:  None. FINDINGS: Segmentation: Standard segmentation is assumed. The inferior-most fully formed intervertebral disc is labeled L5-S1. This is consistent with  prior numbering. Alignment: Similar 8 mm of anterolisthesis of L4 on L5. Approximately 5 mm of anterolisthesis of L3 on L4, mildly progressed from prior. Similar slight stepwise retrolisthesis of T12 on L1, L1 on L2, and L2 on L3. Vertebrae: Partially imaged edema involving bilateral sacral ala with linear T1 hypointensities through the sacral ala bilaterally, compatible with acute/unhealed sacral insufficiency fractures. Vertebral body heights are maintained. Mild edema involving the inferior S1 vertebral body without height loss, suggesting fracture given sacral alar fractures. No bone marrow edema in involving the lumbar vertebral bodies to suggest compression fracture or discitis/osteomyelitis. No suspicious bone lesions. Redemonstrated superior L1 endplate degenerative Schmorl's node. Conus medullaris and cauda equina: Conus extends to the superior L1 level. Conus appears normal. Paraspinal and other soft tissues: Nonspecific hepatic cysts, partially imaged. Disc levels: T10-T11: Only imaged sagittally. Mild disc bulge without significant canal or  foraminal stenosis. T11-T12: Mild disc bulge without significant canal or foraminal stenosis. T12-L1: Slight retrolisthesis of T12 on L1. Broad disc bulge with central disc protrusion and annular fissure. No significant canal or foraminal stenosis. L1-L2: Slight retrolisthesis of L1 on L2. Broad disc bulge and mild bilateral facet hypertrophy without significant canal or foraminal stenosis. L2-L3: Small bilateral foraminal disc protrusions and mild bilateral facet hypertrophy without significant canal or foraminal stenosis. L3-L4: Approximately 5 mm of anterolisthesis of L3 on L4, mildly progressed from prior. Broad disc bulge with decreased size of the previously seen left subarticular disc protrusion. Interval posterior fusion and decompression with improved canal stenosis with widely patent canal. No significant subarticular recess stenosis. Amorphous T1 hypointense material in the left lateral canal and proximal foramen, likely scar tissue. Otherwise, the foramina are patent with mild narrowing. L4-L5: Approximately 8 mm of anterolisthesis of L4 on L5, similar to prior. Uncovering of the disc. Interval posterior fusion and decompression with improved/patent canal. Bilateral facet hypertrophy with moderate right and mild left foraminal stenosis, improved from prior. L5-S1: Interval posterior fusion. Disc bulge and moderate facet hypertrophy. Bilateral subarticular recess stenosis with disc contacting the descending S1 nerve roots. No significant central canal stenosis or foraminal stenosis. IMPRESSION: 1. Partially imaged bilateral acute/unhealed sacral insufficiency fractures and suspected inferior S1 vertebral body fracture without height loss. 2. Interval L3-L5 posterior fusion with posterior decompression and improved canal stenosis at L3-L4 and L4-L5. Foraminal stenosis is also improved with moderate foraminal stenosis on the right at L4-L5 and mild foraminal stenosis bilaterally at L3-L4 and on the left at  L4-L5. Suspected scar tissue in the proximal left L4-L5 foramen. 3. Nonspecific hepatic cysts, partially imaged. These results will be called to the ordering clinician or representative by the Radiologist Assistant, and communication documented in the PACS or [employer]. Electronically Signed   By: Margaretha Sheffield MD   On: 08/30/2020 16:05    Labs:  CBC: Recent Labs    11/30/19 8413 12/02/19 0755 08/03/20 0817 09/09/20 1328  WBC 23.8* 14.3* 14.3* 14.9*  HGB 15.5* 14.7 14.3 14.4  HCT 49.3* 45.2 43.6 44.1  PLT 362 275 254 330    COAGS: Recent Labs    11/30/19 0927 09/09/20 1328  INR 0.9 0.9    BMP: Recent Labs    11/30/19 0927 08/03/20 0817  NA 139 141  K 3.7 3.6  CL 102 105  CO2 22 26  GLUCOSE 112* 99  BUN 25* 15  CALCIUM 9.6 9.5  CREATININE 1.13* 1.05*  GFRNONAA 47* 54*  GFRAA 54*  --      Assessment and Plan:  Bilateral sacral insufficiency fractures. Plan for image-guided bilateral sacroplasty today in IR with Dr. Karenann Cai. Patient is NPO. Afebrile. INR 0.9 today.  Risks and benefits of bilateral sacroplasty were discussed with the patient including, but not limited to education regarding the natural healing process of compression fractures without intervention, bleeding, infection, cement migration which may cause spinal cord damage, paralysis, pulmonary embolism or even death. This interventional procedure involves the use of X-rays and because of the nature of the planned procedure, it is possible that we will have prolonged use of X-ray fluoroscopy. Potential radiation risks to you include (but are not limited to) the following: - A slightly elevated risk for cancer  several years later in life. This risk is typically less than 0.5% percent. This risk is low in comparison to the normal incidence of human cancer, which is 33% for women and 50% for men according to the [place]. - Radiation induced injury can include skin  redness, resembling a rash, tissue breakdown / ulcers and hair loss (which can be temporary or permanent).  The likelihood of either of these occurring depends on the difficulty of the procedure and whether you are sensitive to radiation due to previous procedures, disease, or genetic conditions.  IF your procedure requires a prolonged use of radiation, you will be notified and given written instructions for further action.  It is your responsibility to monitor the irradiated area for the 2 weeks following the procedure and to notify your physician if you are concerned that you have suffered a radiation induced injury.   All of the patient's questions were answered, patient is agreeable to proceed. Consent signed and in chart.   Thank you for this interesting consult.  I greatly enjoyed meeting LEILANA MCQUIRE and look forward to participating in their care.  A copy of this report was sent to the requesting provider on this date.  Electronically Signed: Earley Abide, PA-C 09/09/2020, 2:21 PM   I spent a total of 30 Minutes in face to face in clinical consultation, greater than 50% of which was counseling/coordinating care for sacral fracture/bilateral sacroplasty.

## 2020-09-09 NOTE — Progress Notes (Signed)
Discharge instructions reviewed with pt and her son both voice understanding.  

## 2020-09-09 NOTE — Progress Notes (Signed)
Pt drank an Ensure at [place], PA informed.

## 2020-09-09 NOTE — Procedures (Signed)
INTERVENTIONAL NEURORADIOLOGY BRIEF POSTPROCEDURE NOTE  Fluoroscopy guided sacroplasty   Attending: Dr. Pedro Earls  Assistant: None.   Diagnosis: Bilateral sacral insufficiency fracture.   Access site: Percutaneous.   Anesthesia: Moderate sedation.   Medication used: 3 mg Versed IV; 150 mcg Fentanyl IV.  Complications: None.   Estimated blood loss: Negligible.   Specimen: None.   Findings: Bilateral sacral insufficiency fractures.  Bilateral sacroplasty performed under fluoroscopy.   The patient tolerated the procedure well without incident or complication and is in stable condition.

## 2020-09-13 ENCOUNTER — Other Ambulatory Visit: Payer: Self-pay | Admitting: *Deleted

## 2020-09-13 ENCOUNTER — Telehealth: Payer: Self-pay | Admitting: Neurology

## 2020-09-13 DIAGNOSIS — G7000 Myasthenia gravis without (acute) exacerbation: Secondary | ICD-10-CM

## 2020-09-13 MED ORDER — DULOXETINE HCL 30 MG PO CPEP: 30.0000 mg | ORAL_CAPSULE | Freq: Every day | ORAL | 3 refills | Status: DC

## 2020-09-13 NOTE — Telephone Encounter (Signed)
Son, Leroy Sea called in requesting a call back regarding Aven's last meds she recvd.

## 2020-09-13 NOTE — Telephone Encounter (Signed)
Spoke to Leroy Sea) pt son --requesting refill Rx cymbalta. Sent Rx to the pharmacy.

## 2020-09-13 NOTE — Patient Outreach (Signed)
[facility] Post- Acute Care Coordinator follow up. Member screened for potential care coordination needs.  Verified in [place] (Patient Audrey Cowan) that Mrs. Riester transitioned to home from [facility] on 09/08/20.   Telephone call made to Mrs. Lauman at [phone]. No answer. HIPAA compliant voicemail message left requesting return call.    Marthenia Rolling, MSN, RN,BSN [place] Acute Care Coordinator [phone] [facility]) [phone]  (Toll free office)

## 2020-09-14 ENCOUNTER — Other Ambulatory Visit: Payer: Self-pay | Admitting: *Deleted

## 2020-09-14 NOTE — Telephone Encounter (Signed)
Patient's son called back and said the pharmacy told him they did not get the prescription. Please advise.

## 2020-09-14 NOTE — Patient Outreach (Signed)
[place] Coordinator follow up. Member screened for potential [facility] Care Management needs.  Message received from member's son Zayden Hahne stating Mrs. Sirmon asked that he call writer back. Returned call to Brenly Trawick (son) [phone]. Patient identifiers confirmed.   Leroy Sea states member came home last Thursday and ended up having a back procedure on Friday. States Mrs. Silberman is now home and home health is supposed to come out today. He can not remember name of agency. States someone is staying with Mrs. Spenser to assist from 9 am until 3 or 4 pm daily until he gets home.   Leroy Sea states he has not scheduled PCP appointment yet but he will.   Explained [place] Management services. Leroy Sea states he thinks they will need the additional follow up. States there has been a lot going on.  Will make referral to [place] for care coordination. Leroy Sea states he should be contacted at [phone].  Mrs. Hilger Myasthenia Gravis, HTN, CKD, hypothyroidism, HLD.   Marthenia Rolling, MSN, RN,BSN [place] Acute Care Coordinator [phone] [facility]) [phone]  (Toll free office)

## 2020-09-15 ENCOUNTER — Other Ambulatory Visit: Payer: Self-pay | Admitting: *Deleted

## 2020-09-15 DIAGNOSIS — G7000 Myasthenia gravis without (acute) exacerbation: Secondary | ICD-10-CM

## 2020-09-15 MED ORDER — DULOXETINE HCL 30 MG PO CPEP: 30.0000 mg | ORAL_CAPSULE | Freq: Every day | ORAL | 3 refills | Status: DC

## 2020-09-15 NOTE — Telephone Encounter (Signed)
Re-sent Rx cymbalta --[facility]

## 2020-09-19 ENCOUNTER — Encounter: Payer: Self-pay | Admitting: *Deleted

## 2020-09-19 ENCOUNTER — Other Ambulatory Visit: Payer: Self-pay | Admitting: *Deleted

## 2020-09-19 DIAGNOSIS — Z8719 Personal history of other diseases of the digestive system: Secondary | ICD-10-CM | POA: Insufficient documentation

## 2020-09-19 DIAGNOSIS — C4441 Basal cell carcinoma of skin of scalp and neck: Secondary | ICD-10-CM | POA: Insufficient documentation

## 2020-09-19 DIAGNOSIS — E7800 Pure hypercholesterolemia, unspecified: Secondary | ICD-10-CM | POA: Insufficient documentation

## 2020-09-19 DIAGNOSIS — Z860100 Personal history of colon polyps, unspecified: Secondary | ICD-10-CM | POA: Insufficient documentation

## 2020-09-19 DIAGNOSIS — F419 Anxiety disorder, unspecified: Secondary | ICD-10-CM | POA: Insufficient documentation

## 2020-09-19 DIAGNOSIS — K635 Polyp of colon: Secondary | ICD-10-CM | POA: Insufficient documentation

## 2020-09-19 DIAGNOSIS — L409 Psoriasis, unspecified: Secondary | ICD-10-CM | POA: Insufficient documentation

## 2020-09-19 DIAGNOSIS — G629 Polyneuropathy, unspecified: Secondary | ICD-10-CM | POA: Insufficient documentation

## 2020-09-19 DIAGNOSIS — E538 Deficiency of other specified B group vitamins: Secondary | ICD-10-CM | POA: Insufficient documentation

## 2020-09-19 DIAGNOSIS — M8580 Other specified disorders of bone density and structure, unspecified site: Secondary | ICD-10-CM | POA: Insufficient documentation

## 2020-09-19 NOTE — Patient Outreach (Signed)
[facility]) Care Management  09/19/2020  CALVARY DIFRANCO 12/24/1940 [id number]   [facility] Telephone Assessment/Screen for post snf/complex care referral  Referral Date: 09/14/20 Referral Source: [place] acute coordinator  Referral Reason: care coordination post snf stay-HTN, HLD, myasthenia gravis, CKD, hypothyroidism, lumbar stenosis transitioned to home from [facility] on 09/08/20. Son Audrey Cowan [phone] is primary contact. Will have home health Insurance: medicare, blue cross and blue shield supplement  Last admission -[facility]-  09/09/20 sacroplasty outpatient Dr Ronnald Ramp 08/03/20-08/05/20 Fall, left hip pain myasthenia gravis- d/c to [facility] 08/05/20 to 09/08/20- 09/08/20 [facility] with [facility] 06/21/20-07/03/20 dysarthria and anarthria, weakness, elevated WBC   Outreach attempt # 1 to son, Jeffie Pollock report he was not able to complete the assessment and requested a return call   Patient is able to verify HIPAA, DOB and address Reviewed and addressed referral to [facility] with patient Consent: [facility] RN CM reviewed [facility] services with patient. Patient gave verbal consent for services [facility] telephonic RN CM.     Screening Audrey Cowan reports Mrs Sherrow is not doing as well as she was when she discharged from [place]. He reports attempts were made to keep patient at snf until sacroplasty was unsuccessful She had sacroplasty 09/09/20  Presently Audrey Cowan has hired someone to stay with the patient during the day as he works  He reports paying $15-20/hour but will not be able to continue this He confirms pt has a long term care policy- [employer] but he would need assist with it  Home health PT has visited once for the evaluation and will return this week  Goal: is for the patient to get stronger and to be evaluated by her doctors to find out why she is weaker  Audrey Cowan agrees to [facility] SW referral but reports it will take him 1-2 days to locate the policy information   He denies  other social determinant of health and medical needs during this outreach  Social: Mrs Audrey Cowan lives at home Prior admission and snf stay she was living alone Since bilateral sacroplasty, her son has someone he hired to stay with her during the day while he works. She is able to bathe, dress and feed herself but Audrey Cowan does not trust leaving her at home alone.  He and his wife assist with  IADLs (instrumental Activities of Daily Living) tasks Fall April 2022 Patient Active Problem List   Diagnosis Date Noted   S/P lumbar fusion 12/02/2019   Dysarthria    Hypokalemia 06/23/2019   Benign essential HTN 06/22/2019   Hypothyroidism 06/22/2019   Hyperlipidemia 06/22/2019   CKD (chronic kidney disease), stage III ([place]) 06/22/2019   Generalized weakness 06/22/2019   Leucocytosis 06/22/2019   Closed trimalleolar fracture of right ankle 10/08/2018       Medications: has all medications without cost or side effect concerns Current Outpatient Medications on File Prior to Visit  Medication Sig Dispense Refill   ALPRAZolam (XANAX) 0.5 MG tablet Take 0.5 mg by mouth at bedtime. May take an additional tablet during the day if needed.     ALPRAZolam (XANAX) 0.5 MG tablet Take 1 tablet (0.5 mg total) by mouth at bedtime as needed for anxiety. 30 tablet 0   amLODipine (NORVASC) 10 MG tablet Take 1 tablet (10 mg total) by mouth daily. 30 tablet 1   aspirin EC 81 MG tablet Take 81 mg by mouth every other day. Nighttime med     atorvastatin (LIPITOR) 20  MG tablet Take 20 mg by mouth at bedtime.      Cholecalciferol (VITAMIN D) 50 MCG (2000 UT) tablet Take 2,000 Units by mouth at bedtime.     DULoxetine (CYMBALTA) 30 MG capsule Take 1 capsule (30 mg total) by mouth daily. 90 capsule 3   furosemide (LASIX) 20 MG tablet Take 10 mg by mouth daily.     ibuprofen (ADVIL) 600 MG tablet Take 600 mg by mouth 3 (three) times daily.     levothyroxine (SYNTHROID) 88 MCG tablet Take 88 mcg by mouth daily  before breakfast.     pantoprazole (PROTONIX) 40 MG tablet Take 1 tablet (40 mg total) by mouth daily. 30 tablet 1   predniSONE (DELTASONE) 10 MG tablet      predniSONE (DELTASONE) 10 MG tablet Take 10 mg by mouth daily.     predniSONE (DELTASONE) 5 MG tablet TAKE 3 TABLETS BY MOUTH EVERY DAY 150 tablet 3   pyridostigmine (MESTINON) 60 MG tablet TAKE 1 TABLET BY MOUTH AT 7AM, 1 TABLET AT NOON AND 1 TABLET AT 5PM 90 tablet 0   vitamin B-12 (CYANOCOBALAMIN) 1000 MCG tablet Take 1,000 mcg by mouth once a week. Mondays     No current facility-administered medications on file prior to visit.     Plan: Patient's son, Audrey Cowan agrees to the care plan and follow up -within the next 7-14 business days  Goals Addressed               This Visit's Progress     Patient Stated     [facility]) Prevent Falls and Injury (pt-stated)   On track     Follow Up Date 06//20/2022    - always wear low-heeled or flat shoes or slippers with nonskid soles - use a cane or walker - attend therapy     Notes:  09/19/20 son, brad denies further fall since April 2022, just started working with [facility] PT        (THN)Find Help in My Community (pt-stated)   Not on track     Timeframe:  Short-Term Goal Priority:  High Start Date:                09/19/20             Expected End Date:       11/04/20                Follow Up Date 09/26/20    - call 211 when I need some help - follow-up on any referrals for help I am given     Notes:  09/19/20 with assessment son, Audrey Cowan agrees to [facility] SW referral for home needs for personal care services via long term insurance policy          Shelly L. Lavina Hamman, RN, BSN, [place] Coordinator Office number [phone] Mobile number [phone]  Main [facility] number [phone] Fax number [phone]

## 2020-09-20 DIAGNOSIS — M533 Sacrococcygeal disorders, not elsewhere classified: Secondary | ICD-10-CM | POA: Diagnosis not present

## 2020-09-22 ENCOUNTER — Telehealth: Payer: Self-pay | Admitting: *Deleted

## 2020-09-22 NOTE — Patient Outreach (Addendum)
[facility]) Care Management  09/22/2020  Audrey Cowan 11-Feb-1941 [id number]  CSW received referral for "needs assistance with coordinating personal care services through long term care insurance LTC policy".   CSW made an initial attempt to try and contact patient today to perform phone assessment and was unable to reach pt or her son. CSW left HIPAA compliant messages on voicemail and will await their callback.  CSW will try again in 2-3 days if no return call is received.    Eduard Clos, MSW, LCSW Clinical Social Worker  [place] [phone]  ADDENDUM:   CSW received return call from pt- identity confirmed. CSW introduced self, role and reason for call; needing help at home- and with long term care policy. Pt reports she has got help at home with her during the day while her son is at work- then, her son is there. Pt also reports having [facility] home health services that started yesterday.  Pt denies any needs at this time and is advised to call if needs do arise- CSW will signoff and update [facility] team and PCP to above.   Eduard Clos, MSW, [place] Worker  [place] [phone]

## 2020-09-23 ENCOUNTER — Other Ambulatory Visit: Payer: Self-pay | Admitting: *Deleted

## 2020-09-23 NOTE — Patient Outreach (Signed)
[facility]) Care Management  09/23/2020  DASHA KAWABATA 1940/09/08 [id number]   [place] coordination  [facility] RN CM received an update from [facility] SW that outreach to patient on 09/22/20 occurred Updated [facility] SW the son needs to be outreach about the long term care policy, his general daily availability Left son Leroy Sea a voice message encouraging him to return a call to [place], provided the number. Encouraged him to outreach to [place] CM if the assistance is no longer needed    Plan follow up -within the next 7-14 business days  Zen[place] Leaf L. Lavina Hamman, RN, BSN, [place] Coordinator Office number [phone] Mobile number [phone]  Main [facility] number [phone] Fax number [phone]

## 2020-09-23 NOTE — Patient Outreach (Signed)
[place] [facility]) Care Management  09/23/2020  Audrey Cowan 06-05-1940 [id number]   [facility] Care coordination  Call from [place] Reviewed case information  Plan follow up -within the next 7-14 business days  [name] L. Lavina Hamman, RN, BSN, [place] Coordinator Office number [phone] Mobile number [phone]  Main [facility] number [phone] Fax number [phone]

## 2020-09-26 ENCOUNTER — Other Ambulatory Visit: Payer: Self-pay | Admitting: *Deleted

## 2020-09-26 DIAGNOSIS — Z961 Presence of intraocular lens: Secondary | ICD-10-CM | POA: Diagnosis not present

## 2020-09-26 DIAGNOSIS — H2511 Age-related nuclear cataract, right eye: Secondary | ICD-10-CM | POA: Diagnosis not present

## 2020-09-26 DIAGNOSIS — H5201 Hypermetropia, right eye: Secondary | ICD-10-CM | POA: Diagnosis not present

## 2020-09-26 NOTE — Patient Outreach (Signed)
[place] [facility]) Care Management  09/26/2020  AHMANI DAOUD 1940/05/31 [id number]   [facility] unsuccessful follow up outreach to post snf/complex care referred patient/son   Referral Date: 09/14/20 Referral Source: [place] acute coordinator  Referral Reason: care coordination post snf stay-HTN, HLD, myasthenia gravis, CKD, hypothyroidism, lumbar stenosis transitioned to home from [facility] on 09/08/20. Son Leroy Sea [phone] is primary contact. Will have home health Insurance: medicare, blue cross and blue shield supplement   Last admission -cone- 09/09/20 sacroplasty outpatient Dr Ronnald Ramp 08/03/20-08/05/20 Fall, left hip pain myasthenia gravis- d/c to [facility] 08/05/20 to 09/08/20- 09/08/20 [facility] 06/21/20-07/03/20 dysarthria and anarthria, weakness, elevated WBC     Unsuccessful outreach to son, Pleas Patricia attempt to [phone] No answer. [facility] RN CM left HIPAA [facility] Portability and Accountability Act) compliant voicemail message along with CM's contact info.  Also explained in message importance of him returning a call to [place] Let her contact number Request he outreach to [facility] RN CM if further interventions not needed for [facility] SW  Plan: [facility] RN CM scheduled this patient/son for another call attempt within 4-7 business days  Keerthana Vanrossum L. Lavina Hamman, RN, BSN, [place] Coordinator Office number [phone] Mobile number [phone]  Main [facility] number [phone] Fax number [phone]

## 2020-10-02 ENCOUNTER — Other Ambulatory Visit: Payer: Self-pay | Admitting: Neurology

## 2020-10-05 ENCOUNTER — Other Ambulatory Visit: Payer: Self-pay | Admitting: *Deleted

## 2020-10-05 NOTE — Patient Outreach (Signed)
[place] [facility]) Care Management  10/05/2020  Audrey Cowan 1940-12-26 [id number]   [facility] second unsuccessful follow up outreach to post snf/complex care referred patient/son   Referral Date: 09/14/20 Referral Source: [place] acute coordinator  Referral Reason: care coordination post snf stay-HTN, HLD, myasthenia gravis, CKD, hypothyroidism, lumbar stenosis transitioned to home from [facility] on 09/08/20. Son Leroy Sea [phone] is primary contact. Will have home health Insurance: medicare, blue cross and blue shield supplement   Last admission -cone- 09/09/20 sacroplasty outpatient Dr Ronnald Ramp 08/03/20-08/05/20 Fall, left hip pain myasthenia gravis- d/c to [facility] 08/05/20 to 09/08/20- 09/08/20 d/c [facility] with [facility] 06/21/20-07/03/20 dysarthria and anarthria, weakness, elevated WBC     Unsuccessful outreach to son, Pleas Patricia attempt to [phone] No answer. [facility] RN CM left HIPAA [facility] Portability and Accountability Act) compliant voicemail message along with CM's contact info.  Also explained in message importance of him returning a call to [place] Let her contact number Request he outreach to [place] CM if further interventions not needed for [facility] SW   Plan: [facility] RN CM scheduled this patient/son for another call attempt within 4-7 business day Unsuccessful outreaches on 09/26/20 and 10/05/20  Unsuccessful outreach letter sent   Joelene Millin L. Lavina Hamman, RN, BSN, [place] Coordinator Office number [phone] Mobile number [phone] Main [facility] number [phone] Fax number [phone]

## 2020-10-13 ENCOUNTER — Other Ambulatory Visit: Payer: Self-pay | Admitting: *Deleted

## 2020-10-13 NOTE — Patient Outreach (Signed)
[place] [facility]) Care Management  10/13/2020  JAKAYLEE SASAKI 06/04/1940 [id number]  [facility] follow up to post skilled nursing facility (snf) patient/family with case closure   Referral Date: 09/14/20 Referral Source: [place] acute coordinator  Referral Reason: care coordination post snf stay-HTN, HLD, myasthenia gravis, CKD, hypothyroidism, lumbar stenosis transitioned to home from [facility] on 09/08/20. Son Leroy Sea [phone] is primary contact. Will have home health Insurance: medicare, blue cross and blue shield supplement   Last admission -cone- 09/09/20 sacroplasty outpatient Dr Ronnald Ramp 08/03/20-08/05/20 Fall, left hip pain myasthenia gravis- d/c to [facility] 08/05/20 to 09/08/20- 09/08/20 [facility] 06/21/20-07/03/20 dysarthria and anarthria, weakness, elevated WBC   Her son, Leroy Sea answered and is able to verify HIPAA ([place]) identifiers Reviewed and addressed the purpose of the follow up call   Consent: [facility] RN CM reviewed [place] consent was given   Assessment Doing so much better  Son states he forgot to return a all to RN CM   Plans [facility] case closure  Goals Addressed               This Visit's Progress     Patient Stated     COMPLETED: [facility]) Prevent Falls and Injury (pt-stated)   On track     Case closure Date 10/13/2020  Barriers: Knowledge    - always wear low-heeled or flat shoes or slippers with nonskid soles - use a cane or walker - attend therapy     Notes:  10/13/20 case closure after outreach to son to confirm pt is doing much better, no falls and no longer in need of home care/personal services 10/05/20 unsuccessful outreach Letter sent  09/26/20 unsuccessful outreach 09/19/20 son, brad denies further fall since April 2022, just started working with [facility] PT        COMPLETED: (THN)Find Help in My Community (pt-stated)   On track      Timeframe:  Short-Term Goal Priority:  High Start Date:                09/19/20             Expected End Date:       11/04/20                Case closure Date 10/13/2020   Barriers: Knowledge  - call 211 when I need some help - follow-up on any referrals for help I am given     Notes:  10/13/20 case closure after outreach to son to confirm pt is doing much better, no falls and no longer in need of home care/personal services 10/05/20 unsuccessful outreach Letter sent  09/26/20 unsuccessful outreach  09/19/20 with assessment son, Leroy Sea agrees to [facility] SW referral for home needs for personal care services via long term insurance policy         [place] L. Lavina Hamman, RN, BSN, [place] Coordinator Office number [phone] Main [facility] number [phone] Fax number [phone]

## 2020-10-19 ENCOUNTER — Other Ambulatory Visit: Payer: Self-pay

## 2020-10-25 DIAGNOSIS — H2511 Age-related nuclear cataract, right eye: Secondary | ICD-10-CM | POA: Diagnosis not present

## 2020-10-25 DIAGNOSIS — H25011 Cortical age-related cataract, right eye: Secondary | ICD-10-CM | POA: Diagnosis not present

## 2020-10-25 DIAGNOSIS — H52201 Unspecified astigmatism, right eye: Secondary | ICD-10-CM | POA: Diagnosis not present

## 2020-10-25 DIAGNOSIS — H25811 Combined forms of age-related cataract, right eye: Secondary | ICD-10-CM | POA: Diagnosis not present

## 2020-11-04 ENCOUNTER — Other Ambulatory Visit: Payer: Self-pay | Admitting: Neurology

## 2020-11-14 DIAGNOSIS — W19XXXD Unspecified fall, subsequent encounter: Secondary | ICD-10-CM | POA: Diagnosis not present

## 2020-11-14 DIAGNOSIS — G7000 Myasthenia gravis without (acute) exacerbation: Secondary | ICD-10-CM | POA: Diagnosis not present

## 2020-11-14 DIAGNOSIS — G629 Polyneuropathy, unspecified: Secondary | ICD-10-CM | POA: Diagnosis not present

## 2020-11-18 ENCOUNTER — Ambulatory Visit (INDEPENDENT_AMBULATORY_CARE_PROVIDER_SITE_OTHER): Payer: Medicare Other | Admitting: Neurology

## 2020-11-18 ENCOUNTER — Other Ambulatory Visit: Payer: Self-pay

## 2020-11-18 ENCOUNTER — Encounter: Payer: Self-pay | Admitting: Neurology

## 2020-11-18 VITALS — BP 135/83 | HR 79 | Ht 64.0 in | Wt 145.4 lb

## 2020-11-18 DIAGNOSIS — G7000 Myasthenia gravis without (acute) exacerbation: Secondary | ICD-10-CM

## 2020-11-18 DIAGNOSIS — G629 Polyneuropathy, unspecified: Secondary | ICD-10-CM | POA: Diagnosis not present

## 2020-11-18 NOTE — Progress Notes (Signed)
Follow-up Visit   Date: 11/18/20   Audrey Cowan MRN: [id number] DOB: 07/11/1940   Interim History: Audrey Cowan is a 80 y.o. right-handed Caucasian female with hypothyroidism, hyperlipidemia, anxiety/CKD stage3, hypertension, and vitamin B12 deficiency returning to the clinic for follow-up of myasthenia gravis. The patient was accompanied to the clinic by son.  She was able to reduce prednisone down to '10mg'$ /d and tolerating it well.  No problems with double vision, speech/swallow, or limb weakness. She has generalized fatigue and cannot do as much as she wants. She sought second opinion with Dr. Jennings Books at [facility] for neuropathy and myasthenia, who did not make any changes to her medications. She had right cataract surgery and tolerated this well.    Medications:  Current Outpatient Medications on File Prior to Visit  Medication Sig Dispense Refill   ALPRAZolam (XANAX) 0.5 MG tablet Take 0.5 mg by mouth at bedtime. May take an additional tablet during the day if needed.     amLODipine (NORVASC) 10 MG tablet Take 1 tablet (10 mg total) by mouth daily. 30 tablet 1   aspirin EC 81 MG tablet Take 81 mg by mouth every other day. Nighttime med     atorvastatin (LIPITOR) 20 MG tablet Take 20 mg by mouth at bedtime.      Cholecalciferol (VITAMIN D) 50 MCG (2000 UT) tablet Take 2,000 Units by mouth at bedtime.     DULoxetine (CYMBALTA) 30 MG capsule TAKE 1 CAPSULE BY MOUTH EVERY DAY 90 capsule 4   furosemide (LASIX) 20 MG tablet Take 10 mg by mouth daily.     ibuprofen (ADVIL) 600 MG tablet Take 600 mg by mouth 3 (three) times daily.     levothyroxine (SYNTHROID) 88 MCG tablet Take 88 mcg by mouth daily before breakfast.     predniSONE (DELTASONE) 5 MG tablet TAKE 3 TABLETS BY MOUTH EVERY DAY 150 tablet 3   pyridostigmine (MESTINON) 60 MG tablet TAKE 1 TABLET BY MOUTH AT 7AM, 1 TABLET AT NOON AND 1 TABLET AT 5PM 270 tablet 3   vitamin B-12 (CYANOCOBALAMIN) 1000 MCG  tablet Take 1,000 mcg by mouth once a week. Mondays     ALPRAZolam (XANAX) 0.5 MG tablet Take 1 tablet (0.5 mg total) by mouth at bedtime as needed for anxiety. (Patient not taking: Reported on 11/18/2020) 30 tablet 0   pantoprazole (PROTONIX) 40 MG tablet Take 1 tablet (40 mg total) by mouth daily. 30 tablet 1   predniSONE (DELTASONE) 10 MG tablet  (Patient not taking: Reported on 11/18/2020)     predniSONE (DELTASONE) 10 MG tablet Take 10 mg by mouth daily. (Patient not taking: Reported on 11/18/2020)     No current facility-administered medications on file prior to visit.    Allergies:  Allergies  Allergen Reactions   Other Other (See Comments)    Pt has Myasthenia gravis so all anesthesia must be pre-approved    Vital Signs:  BP 135/83   Pulse 79   Ht '5\' 4"'$  (1.626 m)   Wt 145 lb 6.4 oz (66 kg)   SpO2 95%   BMI 24.96 kg/m    Neurological Exam: MENTAL STATUS including orientation to time, place, person, recent and remote memory, attention span and concentration, language, and fund of knowledge is normal.  Speech is clear, no dysarthria   CRANIAL NERVES:   Normal conjugate, extra-ocular eye movements in all directions of gaze.  No ptosis at baseline or with sustained upgaze.  Oribicular oculi,  buccinator, and orbicularis oris 5/5.   Tongue strength 5/5  MOTOR:  Motor strength is 5/5 in the arms and legs.  No fatigability.  No pronator drift.    MSRs:                                           Right        Left brachioradialis 2+  2+  biceps 2+  2+  triceps 2+  2+  patellar 2+  2+  ankle jerk 1+  1+   SENSATION:  Absent vibration below the ankles bilaterally, intact at the knees.  COORDINATION/GAIT:  Normal finger-to- nose-finger.  Gait appears stable, assisted with rollator   Data: Labs 06/23/2019:  AChR binding 57.1*, blocking 57*, modulating 59*  Lab Results  Component Value Date   VITAMINB12 1,073 (H) 03/27/2019     IMPRESSION/PLAN: 1.  Seropositive generalized  myasthenia gravis without exacerbation, thymoma negative (06/2019).  No evidence of disease manifestation.  She has generalized fatigue which is unlikely to be MG-related and one episode of dysphagia.   - I will continue to monitor her on prednisone '10mg'$  daily and consider tapering further at her next visit.  - Continue mestinon '60mg'$  three times daily (7a, noon, and 5p)  2.  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   - Continue cymbalta '30mg'$  daily  Return to clinic in 3 months    Thank you for allowing me to participate in patient's care.  If I can answer any additional questions, I would be pleased to do so.    Sincerely,    Jesper Stirewalt K. Posey Pronto, DO

## 2020-11-18 NOTE — Patient Instructions (Signed)
Return to clinic 3 months

## 2021-01-02 DIAGNOSIS — G7000 Myasthenia gravis without (acute) exacerbation: Secondary | ICD-10-CM | POA: Diagnosis not present

## 2021-01-02 DIAGNOSIS — I10 Essential (primary) hypertension: Secondary | ICD-10-CM | POA: Diagnosis not present

## 2021-01-02 DIAGNOSIS — Z23 Encounter for immunization: Secondary | ICD-10-CM | POA: Diagnosis not present

## 2021-01-02 DIAGNOSIS — Z0000 Encounter for general adult medical examination without abnormal findings: Secondary | ICD-10-CM | POA: Diagnosis not present

## 2021-01-02 DIAGNOSIS — Z1389 Encounter for screening for other disorder: Secondary | ICD-10-CM | POA: Diagnosis not present

## 2021-01-02 DIAGNOSIS — E7800 Pure hypercholesterolemia, unspecified: Secondary | ICD-10-CM | POA: Diagnosis not present

## 2021-01-02 DIAGNOSIS — N1831 Chronic kidney disease, stage 3a: Secondary | ICD-10-CM | POA: Diagnosis not present

## 2021-01-02 DIAGNOSIS — R634 Abnormal weight loss: Secondary | ICD-10-CM | POA: Diagnosis not present

## 2021-01-02 DIAGNOSIS — M8580 Other specified disorders of bone density and structure, unspecified site: Secondary | ICD-10-CM | POA: Diagnosis not present

## 2021-01-02 DIAGNOSIS — F411 Generalized anxiety disorder: Secondary | ICD-10-CM | POA: Diagnosis not present

## 2021-01-02 DIAGNOSIS — E039 Hypothyroidism, unspecified: Secondary | ICD-10-CM | POA: Diagnosis not present

## 2021-01-02 DIAGNOSIS — E538 Deficiency of other specified B group vitamins: Secondary | ICD-10-CM | POA: Diagnosis not present

## 2021-01-21 DIAGNOSIS — Z23 Encounter for immunization: Secondary | ICD-10-CM | POA: Diagnosis not present

## 2021-02-01 DIAGNOSIS — D0462 Carcinoma in situ of skin of left upper limb, including shoulder: Secondary | ICD-10-CM | POA: Diagnosis not present

## 2021-02-01 DIAGNOSIS — L821 Other seborrheic keratosis: Secondary | ICD-10-CM | POA: Diagnosis not present

## 2021-02-01 DIAGNOSIS — L570 Actinic keratosis: Secondary | ICD-10-CM | POA: Diagnosis not present

## 2021-02-01 DIAGNOSIS — C4441 Basal cell carcinoma of skin of scalp and neck: Secondary | ICD-10-CM | POA: Diagnosis not present

## 2021-02-01 DIAGNOSIS — Z85828 Personal history of other malignant neoplasm of skin: Secondary | ICD-10-CM | POA: Diagnosis not present

## 2021-02-01 DIAGNOSIS — L814 Other melanin hyperpigmentation: Secondary | ICD-10-CM | POA: Diagnosis not present

## 2021-02-01 DIAGNOSIS — D692 Other nonthrombocytopenic purpura: Secondary | ICD-10-CM | POA: Diagnosis not present

## 2021-02-09 ENCOUNTER — Other Ambulatory Visit: Payer: Self-pay | Admitting: Family Medicine

## 2021-02-09 DIAGNOSIS — Z1231 Encounter for screening mammogram for malignant neoplasm of breast: Secondary | ICD-10-CM

## 2021-02-20 ENCOUNTER — Ambulatory Visit (INDEPENDENT_AMBULATORY_CARE_PROVIDER_SITE_OTHER): Payer: Medicare Other | Admitting: Neurology

## 2021-02-20 ENCOUNTER — Encounter: Payer: Self-pay | Admitting: Neurology

## 2021-02-20 ENCOUNTER — Other Ambulatory Visit: Payer: Self-pay

## 2021-02-20 VITALS — BP 148/77 | HR 100 | Resp 18 | Ht 66.0 in | Wt 153.0 lb

## 2021-02-20 DIAGNOSIS — G7000 Myasthenia gravis without (acute) exacerbation: Secondary | ICD-10-CM

## 2021-02-20 DIAGNOSIS — G629 Polyneuropathy, unspecified: Secondary | ICD-10-CM

## 2021-02-20 NOTE — Patient Instructions (Addendum)
Take prednisone 10mg  daily through December, then starting January reduce to 7.5mg  daily  Continue mestinon 60mg  three times daily  Return to clinic in February

## 2021-02-20 NOTE — Progress Notes (Signed)
Follow-up Visit   Date: 02/20/21   WINDA SUMMERALL MRN: [id number] DOB: 1940-10-28   Interim History: VIDHI DELELLIS is a 80 y.o. right-handed Caucasian female with hypothyroidism, hyperlipidemia, anxiety/CKD stage3, hypertension, and vitamin B12 deficiency returning to the clinic for follow-up of myasthenia gravis. The patient was accompanied to the clinic by son.  She has been doing well on prednisone 10mg  daily.  No problems with double vision, speech/swallowing, or limb weakness. She remains frustrated by the contrast numbness in her feet.   Medications:  Current Outpatient Medications on File Prior to Visit  Medication Sig Dispense Refill   ALPRAZolam (XANAX) 0.5 MG tablet Take 0.5 mg by mouth at bedtime. May take an additional tablet during the day if needed.     ALPRAZolam (XANAX) 0.5 MG tablet Take 1 tablet (0.5 mg total) by mouth at bedtime as needed for anxiety. 30 tablet 0   amLODipine (NORVASC) 10 MG tablet Take 1 tablet (10 mg total) by mouth daily. 30 tablet 1   aspirin EC 81 MG tablet Take 81 mg by mouth every other day. Nighttime med     atorvastatin (LIPITOR) 20 MG tablet Take 20 mg by mouth at bedtime.      Cholecalciferol (VITAMIN D) 50 MCG (2000 UT) tablet Take 2,000 Units by mouth at bedtime.     DULoxetine (CYMBALTA) 30 MG capsule TAKE 1 CAPSULE BY MOUTH EVERY DAY 90 capsule 4   furosemide (LASIX) 20 MG tablet Take 10 mg by mouth daily.     ibuprofen (ADVIL) 600 MG tablet Take 600 mg by mouth 3 (three) times daily.     levothyroxine (SYNTHROID) 88 MCG tablet Take 88 mcg by mouth daily before breakfast.     predniSONE (DELTASONE) 10 MG tablet      pyridostigmine (MESTINON) 60 MG tablet TAKE 1 TABLET BY MOUTH AT 7AM, 1 TABLET AT NOON AND 1 TABLET AT 5PM 270 tablet 3   vitamin B-12 (CYANOCOBALAMIN) 1000 MCG tablet Take 1,000 mcg by mouth once a week. Mondays     pantoprazole (PROTONIX) 40 MG tablet Take 1 tablet (40 mg total) by mouth daily. 30 tablet 1    No current facility-administered medications on file prior to visit.    Allergies:  Allergies  Allergen Reactions   Other Other (See Comments)    Pt has Myasthenia gravis so all anesthesia must be pre-approved    Vital Signs:  BP (!) 148/77   Pulse 100   Resp 18   Ht 5\' 6"  (1.676 m)   Wt 153 lb (69.4 kg)   SpO2 95%   BMI 24.69 kg/m    Neurological Exam: MENTAL STATUS including orientation to time, place, person, recent and remote memory, attention span and concentration, language, and fund of knowledge is normal.  Speech is clear, no dysarthria   CRANIAL NERVES:   Normal conjugate, extra-ocular eye movements in all directions of gaze.  No ptosis at baseline or with sustained upgaze.  Oribicular oculi, buccinator, and orbicularis oris 5/5.   Tongue strength 5/5  MOTOR:  Motor strength is 5/5 in the arms and legs.  No fatigability.  No pronator drift.    MSRs:                                           Right        Left brachioradialis 2+  2+  biceps 2+  2+  triceps 2+  2+  patellar 2+  2+  ankle jerk 1+  1+   SENSATION:  Absent vibration below the ankles bilaterally, intact at the knees.  COORDINATION/GAIT:  Normal finger-to- nose-finger.  Gait appears stable, assisted with rollator   Data: Labs 06/23/2019:  AChR binding 57.1*, blocking 57*, modulating 59*  Lab Results  Component Value Date   VITAMINB12 1,073 (H) 03/27/2019     IMPRESSION/PLAN: 1.  Seropositive generalized myasthenia gravis without exacerbation, thymoma negative (06/2019).  No evidence of disease manifestation.  She has generalized fatigue which is unlikely to be MG-related and one episode of dysphagia.   - Continue prednisone 10mg  daily until the end of the year, starting January reduce to 7.5mg  daily  - Continue mestinon 60mg  three times daily (7a, noon, and 5p)  2.  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   - Continue cymbalta 30mg  daily  Return to clinic in 3  months  Total time spent reviewing records, interview, history/exam, documentation, and coordination of care on day of encounter:  20 min   Thank you for allowing me to participate in patient's care.  If I can answer any additional questions, I would be pleased to do so.    Sincerely,    Aayat Hajjar K. Posey Pronto, DO

## 2021-03-16 ENCOUNTER — Ambulatory Visit
Admission: RE | Admit: 2021-03-16 | Discharge: 2021-03-16 | Disposition: A | Discharge status: Home / Self Care | Payer: Medicare Other | Source: Ambulatory Visit | Attending: Family Medicine | Admitting: Family Medicine

## 2021-03-16 ENCOUNTER — Other Ambulatory Visit: Payer: Self-pay

## 2021-03-16 DIAGNOSIS — Z1231 Encounter for screening mammogram for malignant neoplasm of breast: Secondary | ICD-10-CM

## 2021-03-16 IMAGING — MG MM DIGITAL SCREENING BILAT W/ TOMO AND CAD
8 series · 8 of 24 positions shown · non-contrast
Comparison: Previous exam(s).

CLINICAL DATA: Screening.

EXAM:
DIGITAL SCREENING BILATERAL MAMMOGRAM WITH TOMOSYNTHESIS AND CAD
TECHNIQUE: Bilateral screening digital craniocaudal and mediolateral oblique
mammograms were obtained. Bilateral screening digital breast
tomosynthesis was performed. The images were evaluated with
computer-aided detection.

[L MLO synth-2D]
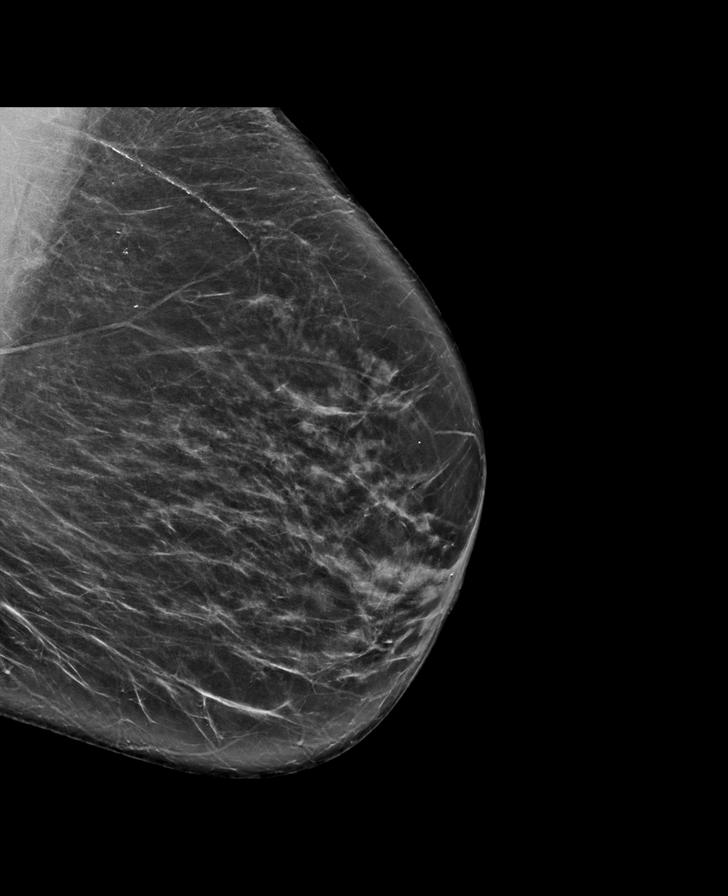

[R CC synth-2D]
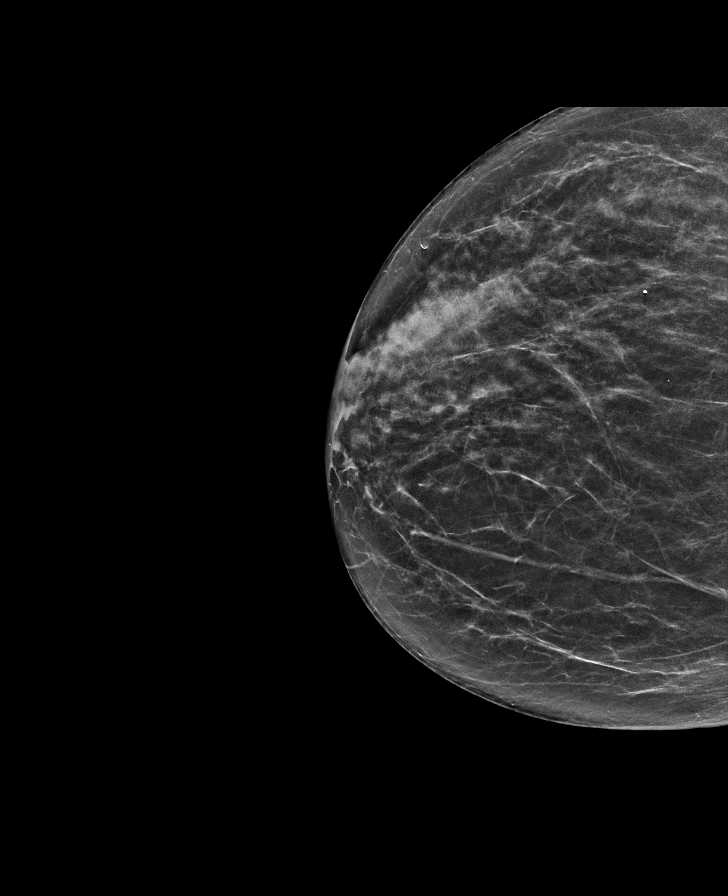

[L CC synth-2D]
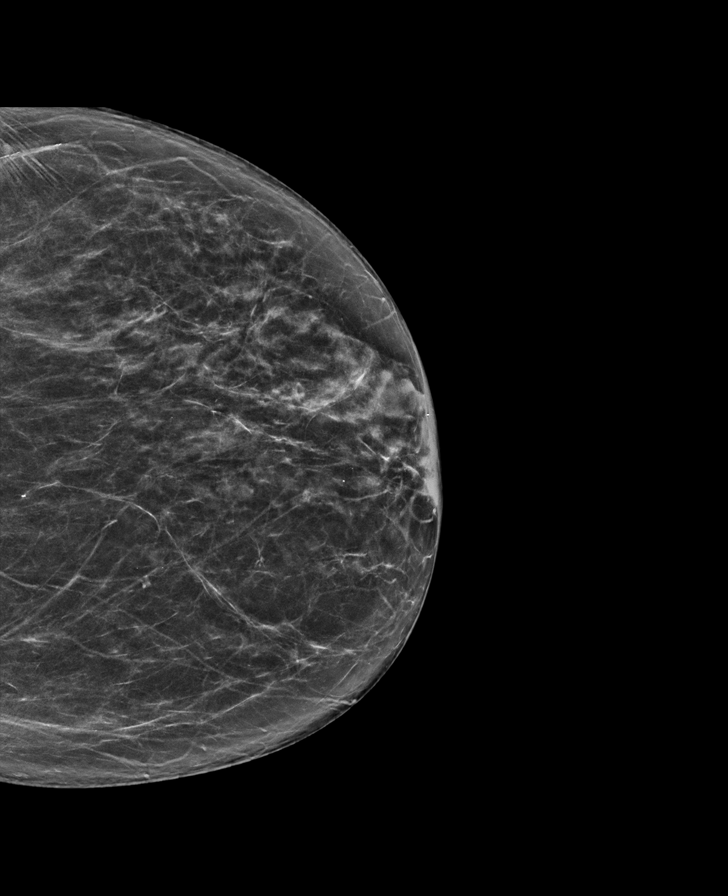

[R MLO synth-2D]
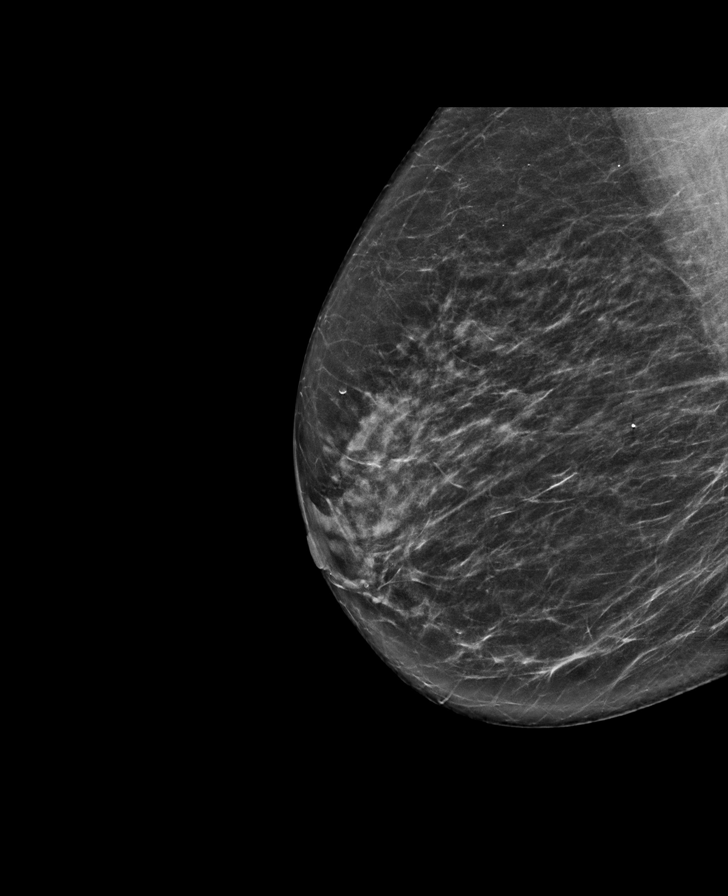

[L MLO tomo · tomo slice 39/76.0]
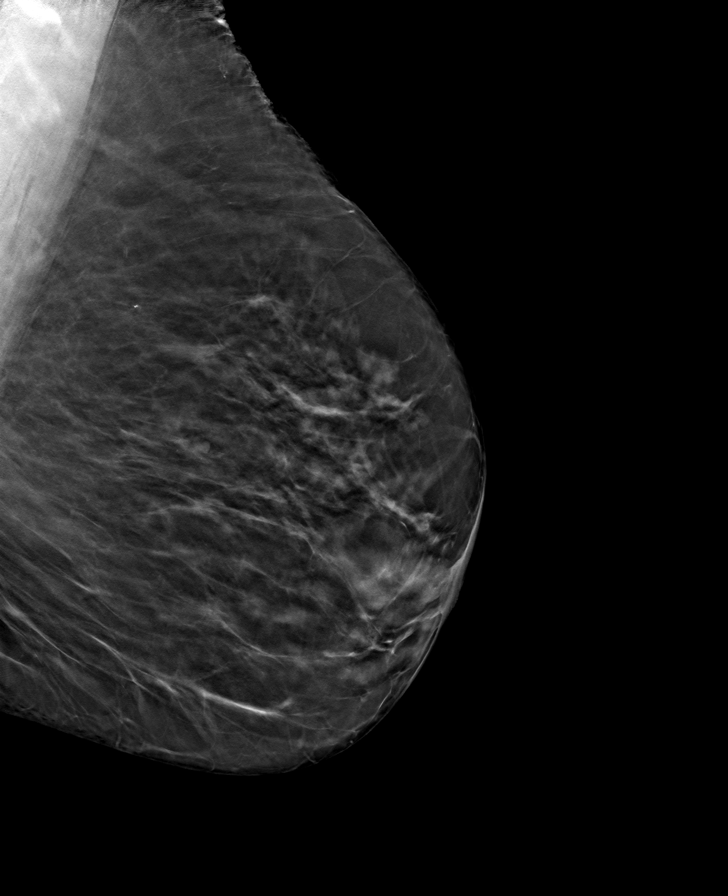

[R CC tomo · tomo slice 33/64.0]
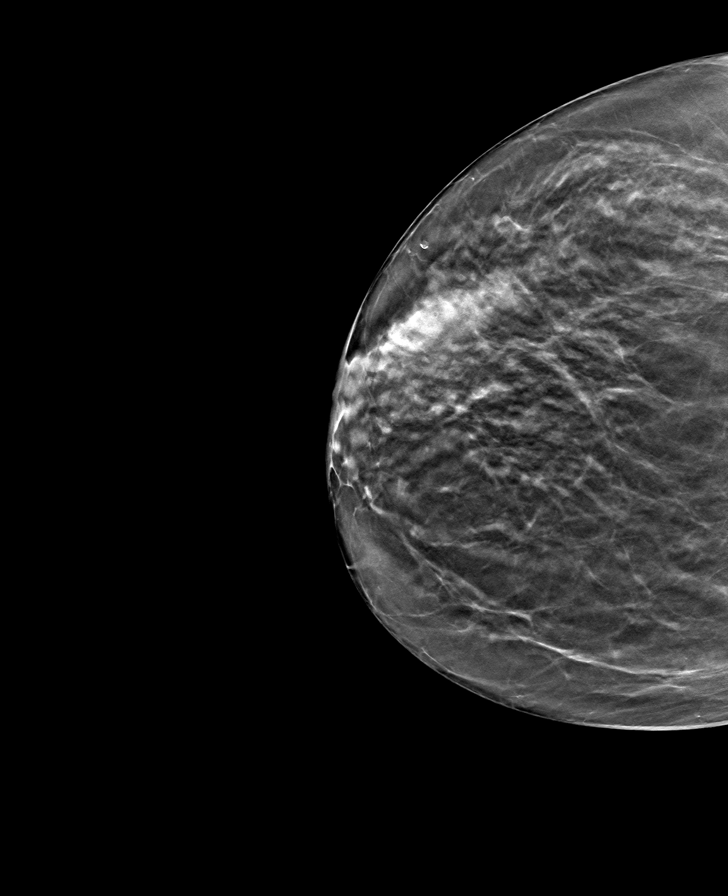

[R MLO tomo · tomo slice 35/69.0]
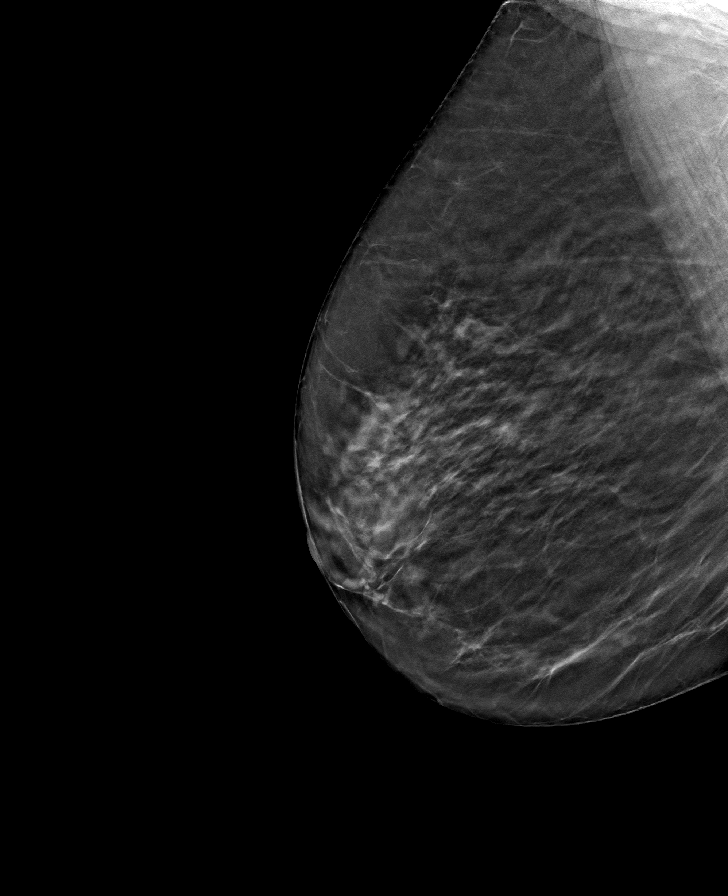

[L CC tomo · tomo slice 35/69.0]
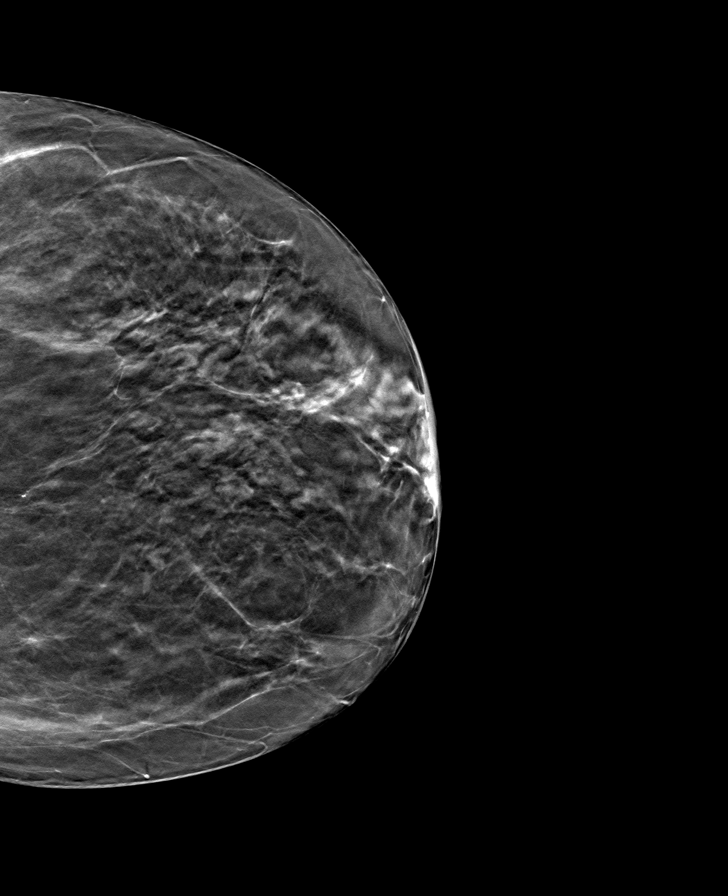

[8 of 24 positions shown; findings below may reference images not displayed]

ACR Breast Density Category b: There are scattered areas of
fibroglandular density.
FINDINGS: There are no findings suspicious for malignancy.
IMPRESSION: No mammographic evidence of malignancy. A result letter of this
screening mammogram will be mailed directly to the patient.

RECOMMENDATION:
Screening mammogram in one year. (Code:[BY])

BI-RADS CATEGORY  1: Negative.

## 2021-05-03 DIAGNOSIS — E039 Hypothyroidism, unspecified: Secondary | ICD-10-CM | POA: Diagnosis not present

## 2021-05-03 DIAGNOSIS — E7800 Pure hypercholesterolemia, unspecified: Secondary | ICD-10-CM | POA: Diagnosis not present

## 2021-05-03 DIAGNOSIS — I10 Essential (primary) hypertension: Secondary | ICD-10-CM | POA: Diagnosis not present

## 2021-05-03 DIAGNOSIS — N1831 Chronic kidney disease, stage 3a: Secondary | ICD-10-CM | POA: Diagnosis not present

## 2021-05-22 DIAGNOSIS — G7000 Myasthenia gravis without (acute) exacerbation: Secondary | ICD-10-CM | POA: Diagnosis not present

## 2021-05-22 DIAGNOSIS — K635 Polyp of colon: Secondary | ICD-10-CM | POA: Diagnosis not present

## 2021-05-24 ENCOUNTER — Telehealth: Payer: Self-pay | Admitting: Neurology

## 2021-05-24 DIAGNOSIS — J069 Acute upper respiratory infection, unspecified: Secondary | ICD-10-CM | POA: Diagnosis not present

## 2021-05-24 DIAGNOSIS — U071 COVID-19: Secondary | ICD-10-CM | POA: Diagnosis not present
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
## 2021-05-25 NOTE — Telephone Encounter (Signed)
[place] for virtual visit. Would not recommend prednisone changes until she has been evaluated. Infection can cause worsening of myasthenia and best to treat the underlying infection, as myasthenia symptoms improve once the infection has cleared.

## 2021-05-25 NOTE — Telephone Encounter (Signed)
Called patients son and informed him that it would be ok for virtual visit. Also informed patients son it is not recommend prednisone changes until patient has been evaluated. Infection can cause worsening of myasthenia and best to treat the underlying infection, as myasthenia symptoms improve once the infection has cleared.  Patients son stated that patient has tested positive for Covid and had a VV appt scheduled for Monday the 20 th. Patients son verbalized understanding and had no further questions or concerns.

## 2021-05-26 ENCOUNTER — Ambulatory Visit: Payer: Medicare Other | Admitting: Neurology

## 2021-05-29 ENCOUNTER — Telehealth (INDEPENDENT_AMBULATORY_CARE_PROVIDER_SITE_OTHER): Payer: Medicare Other | Admitting: Neurology

## 2021-05-29 ENCOUNTER — Encounter: Payer: Self-pay | Admitting: Neurology

## 2021-05-29 ENCOUNTER — Other Ambulatory Visit: Payer: Self-pay

## 2021-05-29 VITALS — Ht 65.5 in | Wt 149.0 lb

## 2021-05-29 DIAGNOSIS — G7000 Myasthenia gravis without (acute) exacerbation: Secondary | ICD-10-CM | POA: Diagnosis not present

## 2021-05-29 DIAGNOSIS — G629 Polyneuropathy, unspecified: Secondary | ICD-10-CM

## 2021-05-29 NOTE — Progress Notes (Signed)
° °  Virtual Visit via Video Note The purpose of this virtual visit is to provide medical care while limiting exposure to the novel coronavirus.    Consent was obtained for video visit:  Yes.   Answered questions that patient had about telehealth interaction:  Yes.   I discussed the limitations, risks, security and privacy concerns of performing an evaluation and management service by telemedicine. I also discussed with the patient that there may be a patient responsible charge related to this service. The patient expressed understanding and agreed to proceed.  Pt location: Home Physician Location: office Name of referring provider:  Carol Ada, MD I connected with Audrey Cowan at patients initiation/request on 05/29/2021 at 10:50 AM EST by video enabled telemedicine application and verified that I am speaking with the correct person using two identifiers. Pt MRN:  [id number] Pt DOB:  [birth year] Video Participants:  Audrey Cowan   History of Present Illness: This is a 81 y.o. female returning for follow-up of seropositive myasthenia gravis and neuropathy.  She tested positive for COVID last week and completed therapy with Paxlovid.  Prior to starting therapy, she acutely developed shortness of breath and son wanted to take her to the ER, but convinced him not to.  Her breathing returned to baseline and she feels that it may have occurred in the setting of panic.  She denies double vision, difficulty swallowing/talking, limb weakness, or ongoing shortness of breath. She is on prednisone 7.5mg  since January and tolerating this well.   Neuropathy is stable.  She uses a rollator to ambulate.    Observations/Objective:   Vitals:   05/29/21 0901  Weight: 149 lb (67.6 kg)  Height: 5' 5.5" (1.664 m)   Patient is awake, alert, and appears comfortable.    Extraocular muscles are intact. No ptosis.  Face is symmetric.  Speech is not dysarthric.  Antigravity in all extremities.  Gait is  assisted with rollator.   Assessment and Plan:  Psuedoexacerbation of myasthenia gravis in the setting of COVID infection, fortunately, she denies any ongoing MG symptoms and feels that she is doing since completing Paxlovid.  Patient reassured that I did not see any signs of true MG exacerbation and it is common for there to be transient weakness with any infection/stress to the body and management is focused at treating the underlying condition Seropositive generalized myasthenia gravis without exacerbation (06/2019).  Previously required monthly IVIG (06/2019 - 03/2020) and highest dose of prednisone was 20mg .  She has been tolerating prednisone taper  - I will keep her on prednisone 7.5mg  daily through the summer and decide on further tapering during the fall - Continue mestinon 60mg  three times daily (7a, noon, and 5p) - If she develops any weakness/MG exacerbation during the summer, increase prednisone to 20mg  daily. Peripheral neuropathy, stable  - Continue Cymbalta 30mg  daily  Follow Up Instructions:   I discussed the assessment and treatment plan with the patient. The patient was provided an opportunity to ask questions and all were answered. The patient agreed with the plan and demonstrated an understanding of the instructions.   The patient was advised to call back or seek an in-person evaluation if the symptoms worsen or if the condition fails to improve as anticipated.  Follow-up in 7 months  Total time spent reviewing records, interview, history/exam, documentation, and coordination of care on day of encounter:  30 min     [place], DO

## 2021-06-20 DIAGNOSIS — Z20822 Contact with and (suspected) exposure to covid-19: Secondary | ICD-10-CM | POA: Diagnosis not present

## 2021-07-04 DIAGNOSIS — F419 Anxiety disorder, unspecified: Secondary | ICD-10-CM | POA: Diagnosis not present

## 2021-07-04 DIAGNOSIS — E039 Hypothyroidism, unspecified: Secondary | ICD-10-CM | POA: Diagnosis not present

## 2021-07-04 DIAGNOSIS — E7800 Pure hypercholesterolemia, unspecified: Secondary | ICD-10-CM | POA: Diagnosis not present

## 2021-07-04 DIAGNOSIS — S81802A Unspecified open wound, left lower leg, initial encounter: Secondary | ICD-10-CM | POA: Diagnosis not present

## 2021-07-04 DIAGNOSIS — G7000 Myasthenia gravis without (acute) exacerbation: Secondary | ICD-10-CM | POA: Diagnosis not present

## 2021-07-04 DIAGNOSIS — I10 Essential (primary) hypertension: Secondary | ICD-10-CM | POA: Diagnosis not present

## 2021-07-04 DIAGNOSIS — N1831 Chronic kidney disease, stage 3a: Secondary | ICD-10-CM | POA: Diagnosis not present

## 2021-07-04 DIAGNOSIS — E538 Deficiency of other specified B group vitamins: Secondary | ICD-10-CM | POA: Diagnosis not present

## 2021-07-07 ENCOUNTER — Other Ambulatory Visit: Payer: Self-pay | Admitting: Neurology

## 2021-08-14 DIAGNOSIS — Z20822 Contact with and (suspected) exposure to covid-19: Secondary | ICD-10-CM | POA: Diagnosis not present

## 2021-09-25 ENCOUNTER — Other Ambulatory Visit: Payer: Self-pay | Admitting: Neurology

## 2021-09-25 ENCOUNTER — Telehealth: Payer: Self-pay | Admitting: Neurology

## 2021-09-25 MED ORDER — PREDNISONE 10 MG PO TABS: 10.0000 mg | ORAL_TABLET | Freq: Every day | ORAL | 1 refills | Status: DC

## 2021-09-25 NOTE — Telephone Encounter (Signed)
1. Which medications need refilled? (List name and dosage, if known) prednisone   2. Which pharmacy/location is medication to be sent to? (include street and city if local pharmacy) [facility] [place]  Previous note closed before routing. Patient is out of the medication per son. He is requesting a call back about this.

## 2021-09-25 NOTE — Telephone Encounter (Signed)
Pt son called informed that refill was sent in

## 2021-09-25 NOTE — Telephone Encounter (Signed)
1. Which medications need refilled? (List name and dosage, if known) prednisone  2. Which pharmacy/location is medication to be sent to? (include street and city if local pharmacy) [facility] [place]

## 2021-11-20 ENCOUNTER — Other Ambulatory Visit: Payer: Self-pay | Admitting: Neurology

## 2021-11-20 NOTE — Telephone Encounter (Signed)
Pt's son Audrey Cowan called back in returning Audrey Cowan's call

## 2021-11-20 NOTE — Telephone Encounter (Signed)
Called patients son and left a message for a call back. Need to ask Brad what dose of Prednisone patient is currently on.

## 2021-11-24 DIAGNOSIS — Z961 Presence of intraocular lens: Secondary | ICD-10-CM | POA: Diagnosis not present

## 2021-11-24 DIAGNOSIS — H26492 Other secondary cataract, left eye: Secondary | ICD-10-CM | POA: Diagnosis not present

## 2021-12-18 ENCOUNTER — Encounter: Payer: Self-pay | Admitting: Neurology

## 2021-12-18 ENCOUNTER — Ambulatory Visit (INDEPENDENT_AMBULATORY_CARE_PROVIDER_SITE_OTHER): Payer: Medicare Other | Admitting: Neurology

## 2021-12-18 VITALS — BP 151/75 | HR 86 | Ht 65.5 in | Wt 132.0 lb

## 2021-12-18 DIAGNOSIS — G7000 Myasthenia gravis without (acute) exacerbation: Secondary | ICD-10-CM

## 2021-12-18 DIAGNOSIS — G629 Polyneuropathy, unspecified: Secondary | ICD-10-CM

## 2021-12-18 MED ORDER — PREDNISONE 1 MG PO TABS: ORAL_TABLET | ORAL | 3 refills | Status: DC

## 2021-12-18 NOTE — Patient Instructions (Signed)
Reduce prednisone to '6mg'$ /day (take '1mg'$  tablet + '5mg'$  tablet) for one month, then reduce to '5mg'$  daily  Return to clinic in 3 months

## 2021-12-18 NOTE — Progress Notes (Signed)
Follow-up Visit   Date: 12/18/21   Audrey Cowan MRN: [id number] DOB: 1940/05/20   Interim History: Audrey Cowan is a 81 y.o. right-handed Caucasian female with hypothyroidism, hyperlipidemia, anxiety/CKD stage3, hypertension, and vitamin B12 deficiency returning to the clinic for follow-up of myasthenia gravis. The patient was accompanied to the clinic by son.  IMPRESSION/PLAN: Seroposotive generalized myasthenia gravis without exacerbation (06/2019). Previously required monthly IVIG (3/221-03/2020) and highest dose of prednisone was '20mg'$ /d.  She has been doing well on prednisone taper  - Reduce prednisone to '6mg'$  daily x 1 month, then '5mg'$  daily  - Continue mestinon '60mg'$  three times daily (7a, noon, and 5p)  Peripheral neuropathy, stable  - Continue Cymbalta '30mg'$  daily  Return to clinic in 3 months   ----------------------------------------------------------- UPDATE 12/18/2021:  She as been doing well from a MG standpoint.  No double vision, ptosis, difficulty with speech/swallow, or weakness.  She has been on prednisone 7.'5mg'$  daily.   She continues to be bothered by imbalance and numbness.  She uses a walker at all times.  She has been walking in the pool for balance exercises.  She remains frustrated with the numbness in the legs and feet.    Medications:  Current Outpatient Medications on File Prior to Visit  Medication Sig Dispense Refill   ALPRAZolam (XANAX) 0.5 MG tablet Take 0.5 mg by mouth at bedtime. May take an additional tablet during the day if needed.     ALPRAZolam (XANAX) 0.5 MG tablet Take 1 tablet (0.5 mg total) by mouth at bedtime as needed for anxiety. 30 tablet 0   amLODipine (NORVASC) 10 MG tablet Take 1 tablet (10 mg total) by mouth daily. 30 tablet 1   aspirin EC 81 MG tablet Take 81 mg by mouth every other day. Nighttime med  Takes it Monday, Wednesday, and Friday.     atorvastatin (LIPITOR) 20 MG tablet Take 20 mg by mouth at bedtime.       Cholecalciferol (VITAMIN D) 50 MCG (2000 UT) tablet Take 2,000 Units by mouth at bedtime.     DULoxetine (CYMBALTA) 30 MG capsule TAKE 1 CAPSULE BY MOUTH EVERY DAY 90 capsule 4   furosemide (LASIX) 20 MG tablet Take 10 mg by mouth daily.     ibuprofen (ADVIL) 600 MG tablet Take 600 mg by mouth 3 (three) times daily.     levothyroxine (SYNTHROID) 88 MCG tablet Take 88 mcg by mouth daily before breakfast.     pantoprazole (PROTONIX) 40 MG tablet Take 1 tablet (40 mg total) by mouth daily. 30 tablet 1   predniSONE (DELTASONE) 5 MG tablet Take 1 1/2 tablets daily 135 tablet 1   pyridostigmine (MESTINON) 60 MG tablet TAKE 1 TABLET BY MOUTH AT 7AM, 1 TABLET AT NOON AND 1 TABLET AT 5PM 270 tablet 3   vitamin B-12 (CYANOCOBALAMIN) 1000 MCG tablet Take 1,000 mcg by mouth once a week. Mondays     No current facility-administered medications on file prior to visit.    Allergies:  Allergies  Allergen Reactions   Other Other (See Comments)    Pt has Myasthenia gravis so all anesthesia must be pre-approved    Vital Signs:  BP (!) 151/75   Pulse 86   Ht 5' 5.5" (1.664 m)   Wt 132 lb (59.9 kg)   SpO2 100%   BMI 21.63 kg/m    Neurological Exam: MENTAL STATUS including orientation to time, place, person, recent and remote memory, attention span and concentration, language, and  fund of knowledge is normal.  Speech is clear, no dysarthria   CRANIAL NERVES:   Normal conjugate, extra-ocular eye movements in all directions of gaze.  No ptosis at baseline or with sustained upgaze.  Oribicular oculi, buccinator, and orbicularis oris 5/5.   Tongue strength 5/5  MOTOR:  Motor strength is 5/5 in the arms and legs.  No fatigability.  No pronator drift.    MSRs:                                           Right        Left brachioradialis 2+  2+  biceps 2+  2+  triceps 2+  2+  patellar 2+  2+  ankle jerk 1+  1+   SENSATION:  Absent vibration below the ankles bilaterally, intact at the  knees.  COORDINATION/GAIT:  Normal finger-to- nose-finger.  Gait appears stable, assisted with rollator   Data: Labs 06/23/2019:  AChR binding 57.1*, blocking 57*, modulating 59*  Lab Results  Component Value Date   VITAMINB12 1,073 (H) 03/27/2019    Thank you for allowing me to participate in patient's care.  If I can answer any additional questions, I would be pleased to do so.    Sincerely,    Essie Lagunes K. Posey Pronto, DO

## 2022-01-11 DIAGNOSIS — Z23 Encounter for immunization: Secondary | ICD-10-CM | POA: Diagnosis not present

## 2022-01-17 DIAGNOSIS — Z23 Encounter for immunization: Secondary | ICD-10-CM | POA: Diagnosis not present

## 2022-01-29 ENCOUNTER — Other Ambulatory Visit: Payer: Self-pay | Admitting: Neurology

## 2022-01-30 DIAGNOSIS — F411 Generalized anxiety disorder: Secondary | ICD-10-CM | POA: Diagnosis not present

## 2022-01-30 DIAGNOSIS — E039 Hypothyroidism, unspecified: Secondary | ICD-10-CM | POA: Diagnosis not present

## 2022-01-30 DIAGNOSIS — N1831 Chronic kidney disease, stage 3a: Secondary | ICD-10-CM | POA: Diagnosis not present

## 2022-01-30 DIAGNOSIS — Z0000 Encounter for general adult medical examination without abnormal findings: Secondary | ICD-10-CM | POA: Diagnosis not present

## 2022-01-30 DIAGNOSIS — I10 Essential (primary) hypertension: Secondary | ICD-10-CM | POA: Diagnosis not present

## 2022-01-30 DIAGNOSIS — Z1331 Encounter for screening for depression: Secondary | ICD-10-CM | POA: Diagnosis not present

## 2022-01-30 DIAGNOSIS — E538 Deficiency of other specified B group vitamins: Secondary | ICD-10-CM | POA: Diagnosis not present

## 2022-01-30 DIAGNOSIS — G7000 Myasthenia gravis without (acute) exacerbation: Secondary | ICD-10-CM | POA: Diagnosis not present

## 2022-01-30 DIAGNOSIS — E7800 Pure hypercholesterolemia, unspecified: Secondary | ICD-10-CM | POA: Diagnosis not present

## 2022-01-30 DIAGNOSIS — I700 Atherosclerosis of aorta: Secondary | ICD-10-CM | POA: Diagnosis not present

## 2022-02-07 DIAGNOSIS — L821 Other seborrheic keratosis: Secondary | ICD-10-CM | POA: Diagnosis not present

## 2022-02-07 DIAGNOSIS — C44311 Basal cell carcinoma of skin of nose: Secondary | ICD-10-CM | POA: Diagnosis not present

## 2022-02-07 DIAGNOSIS — Z85828 Personal history of other malignant neoplasm of skin: Secondary | ICD-10-CM | POA: Diagnosis not present

## 2022-02-07 DIAGNOSIS — L720 Epidermal cyst: Secondary | ICD-10-CM | POA: Diagnosis not present

## 2022-02-07 DIAGNOSIS — D0471 Carcinoma in situ of skin of right lower limb, including hip: Secondary | ICD-10-CM | POA: Diagnosis not present

## 2022-02-07 DIAGNOSIS — D225 Melanocytic nevi of trunk: Secondary | ICD-10-CM | POA: Diagnosis not present

## 2022-02-07 DIAGNOSIS — D485 Neoplasm of uncertain behavior of skin: Secondary | ICD-10-CM | POA: Diagnosis not present

## 2022-02-07 DIAGNOSIS — L814 Other melanin hyperpigmentation: Secondary | ICD-10-CM | POA: Diagnosis not present

## 2022-02-07 DIAGNOSIS — L570 Actinic keratosis: Secondary | ICD-10-CM | POA: Diagnosis not present

## 2022-02-07 DIAGNOSIS — D692 Other nonthrombocytopenic purpura: Secondary | ICD-10-CM | POA: Diagnosis not present

## 2022-02-12 ENCOUNTER — Other Ambulatory Visit: Payer: Self-pay | Admitting: Family Medicine

## 2022-02-12 DIAGNOSIS — Z1231 Encounter for screening mammogram for malignant neoplasm of breast: Secondary | ICD-10-CM

## 2022-03-23 ENCOUNTER — Ambulatory Visit: Payer: Medicare Other | Admitting: Neurology

## 2022-03-27 DIAGNOSIS — C44311 Basal cell carcinoma of skin of nose: Secondary | ICD-10-CM | POA: Diagnosis not present

## 2022-03-27 DIAGNOSIS — Z85828 Personal history of other malignant neoplasm of skin: Secondary | ICD-10-CM | POA: Diagnosis not present

## 2022-04-11 ENCOUNTER — Other Ambulatory Visit: Payer: Self-pay | Admitting: Neurology

## 2022-04-11 NOTE — Telephone Encounter (Signed)
Called patients son and asked what dose of prednisone patient is on and that we had a refill request and wanted to verify. Patients son stated that he will call me back with the dose.

## 2022-04-12 ENCOUNTER — Ambulatory Visit
Admission: RE | Admit: 2022-04-12 | Discharge: 2022-04-12 | Disposition: A | Discharge status: Home / Self Care | Payer: Medicare Other | Source: Ambulatory Visit | Attending: Family Medicine | Admitting: Family Medicine

## 2022-04-12 ENCOUNTER — Telehealth: Payer: Self-pay

## 2022-04-12 DIAGNOSIS — Z1231 Encounter for screening mammogram for malignant neoplasm of breast: Secondary | ICD-10-CM

## 2022-04-12 MED ORDER — PREDNISONE 5 MG PO TABS: ORAL_TABLET | ORAL | 0 refills | Status: DC

## 2022-04-12 NOTE — Telephone Encounter (Signed)
Please have her reduce prednisone to '5mg'$  daily.  She will not need the '1mg'$  tablets.  She is scheduled to see me on 1/17 and I can see show she is doing on the lower dose.  Thanks.

## 2022-04-12 NOTE — Telephone Encounter (Signed)
Called and spoke to patients son Leroy Sea and informed him that Dr. Posey Pronto would like patient to start on 5 mg daily of Prednisone. Informed patients son that Dr. Posey Pronto will evaluate patient at her follow up this month. Brad verbalized understanding and is aware that a refill will be sent for 5 mg prednisone.

## 2022-04-12 NOTE — Telephone Encounter (Signed)
Patients son Audrey Cowan called and informed me that patient is still currently taking 6 mg of prednisone. He knows that his mother was suppose to reduce to 5 mg after 1 month from patients last visit on 12/18/2021. Patients son stated she will need a refill.

## 2022-04-25 ENCOUNTER — Encounter: Payer: Self-pay | Admitting: Neurology

## 2022-04-25 ENCOUNTER — Ambulatory Visit (INDEPENDENT_AMBULATORY_CARE_PROVIDER_SITE_OTHER): Payer: Medicare Other | Admitting: Neurology

## 2022-04-25 VITALS — BP 136/74 | HR 74 | Ht 65.5 in | Wt 145.0 lb

## 2022-04-25 DIAGNOSIS — G629 Polyneuropathy, unspecified: Secondary | ICD-10-CM | POA: Diagnosis not present

## 2022-04-25 DIAGNOSIS — G7000 Myasthenia gravis without (acute) exacerbation: Secondary | ICD-10-CM | POA: Diagnosis not present

## 2022-04-25 MED ORDER — PREDNISONE 5 MG PO TABS: ORAL_TABLET | ORAL | 1 refills | Status: DC

## 2022-04-25 NOTE — Progress Notes (Signed)
Follow-up Visit   Date: 04/25/22   Audrey Cowan MRN: [id number] DOB: Mar 22, 1941   Interim History: Audrey Cowan is a 82 y.o. right-handed Caucasian female with hypothyroidism, hyperlipidemia, anxiety/CKD stage3, hypertension, and vitamin B12 deficiency returning to the clinic for follow-up of myasthenia gravis. The patient was accompanied to the clinic by self.  IMPRESSION/PLAN: Seroposotive generalized myasthenia gravis without exacerbation (06/2019). Previously required monthly IVIG (3/221-03/2020) and highest dose of prednisone was '20mg'$ /d.  She has been doing well on prednisone taper  - Continue prednisone '5mg'$  daily  - Continue mestinon '60mg'$  three times daily (7a, noon, and 5p).  Consider tapering this in the future   Peripheral neuropathy manifesting with numbness, stable  - Recently increased to Cymbalta '60mg'$  by PCP, however, she does not appreciate any improvement in her numbness.  She expresses interest in trying to come off the medication all together.  I instructed her to reduce Cymbalta to '30mg'$  daily x 1 week, then stop.  If she has worsening pain, then restart Cymbalta '30mg'$ /d.   Return to clinic in 3 months   ----------------------------------------------------------- UPDATE 12/18/2021:  She as been doing well from a MG standpoint.  No double vision, ptosis, difficulty with speech/swallow, or weakness.  She has been on prednisone 7.'5mg'$  daily.   She continues to be bothered by imbalance and numbness.  She uses a walker at all times.  She has been walking in the pool for balance exercises.  She remains frustrated with the numbness in the legs and feet.   UPDATE 04/25/2022:  She is here for follow-up visit.  She tapered her prednisone to '5mg'$  daily about 3 weeks ago and is tolerating this well. No double vision, droopy eyelids, difficulty swallowing/talking, or limb weakness.  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  PCP  recently increase Cymbalta to '60mg'$ /d, however, she has not noticed any improvement.  She denies pain or tingling in the limbs. Her son did not accompany her today as he is recovered from knee surgery.  Medications:  Current Outpatient Medications on File Prior to Visit  Medication Sig Dispense Refill   ALPRAZolam (XANAX) 0.5 MG tablet Take 0.5 mg by mouth at bedtime. May take an additional tablet during the day if needed.     amLODipine (NORVASC) 10 MG tablet Take 1 tablet (10 mg total) by mouth daily. 30 tablet 1   aspirin EC 81 MG tablet Take 81 mg by mouth every other day. Nighttime med  Takes it Monday, Wednesday, and Friday.     atorvastatin (LIPITOR) 20 MG tablet Take 20 mg by mouth at bedtime.      Cholecalciferol (VITAMIN D) 50 MCG (2000 UT) tablet Take 2,000 Units by mouth at bedtime.     DULoxetine (CYMBALTA) 30 MG capsule TAKE 1 CAPSULE BY MOUTH EVERY DAY 90 capsule 1   furosemide (LASIX) 20 MG tablet Take 10 mg by mouth daily. 1/2 tablet every other day     ibuprofen (ADVIL) 600 MG tablet Take 600 mg by mouth 3 (three) times daily.     levothyroxine (SYNTHROID) 88 MCG tablet Take 88 mcg by mouth daily before breakfast.     pantoprazole (PROTONIX) 40 MG tablet Take 1 tablet (40 mg total) by mouth daily. 30 tablet 1   predniSONE (DELTASONE) 5 MG tablet Take one tablet daily. 30 tablet 0   pyridostigmine (MESTINON) 60 MG tablet TAKE 1 TABLET BY MOUTH AT 7AM, 1 TABLET AT NOON  AND 1 TABLET AT 5PM 270 tablet 3   vitamin B-12 (CYANOCOBALAMIN) 1000 MCG tablet Take 1,000 mcg by mouth once a week. Mondays     predniSONE (DELTASONE) 1 MG tablet Take '1mg'$  + '5mg'$  tablets = '6mg'$ /d (Patient not taking: Reported on 04/25/2022) 30 tablet 3   No current facility-administered medications on file prior to visit.    Allergies:  Allergies  Allergen Reactions   Other Other (See Comments)    Pt has Myasthenia gravis so all anesthesia must be pre-approved    Vital Signs:  BP 136/74   Pulse 74   Ht  5' 5.5" (1.664 m)   Wt 145 lb (65.8 kg)   SpO2 97%   BMI 23.76 kg/m    Neurological Exam: MENTAL STATUS including orientation to time, place, person, recent and remote memory, attention span and concentration, language, and fund of knowledge is normal.  Speech is clear, no dysarthria   CRANIAL NERVES:   Normal conjugate, extra-ocular eye movements in all directions of gaze.  No ptosis at baseline or with sustained upgaze.  Oribicular oculi, buccinator, and orbicularis oris 5/5.   Tongue strength 5/5  MOTOR:  Motor strength is 5/5 in the arms and legs.  No fatigability.  No pronator drift.    MSRs:                                           Right        Left brachioradialis 2+  2+  biceps 2+  2+  triceps 2+  2+  patellar 2+  2+  ankle jerk 1+  1+   SENSATION:  Absent vibration below the ankles bilaterally, intact at the knees.  COORDINATION/GAIT:  Normal finger-to- nose-finger.  Gait appears stable, assisted with rollator   Data: Labs 06/23/2019:  AChR binding 57.1*, blocking 57*, modulating 59*  Lab Results  Component Value Date   VITAMINB12 1,073 (H) 03/27/2019   Total time spent reviewing records, interview, history/exam, documentation, and coordination of care on day of encounter:  20 min   Thank you for allowing me to participate in patient's care.  If I can answer any additional questions, I would be pleased to do so.    Sincerely,    Eivan Gallina K. Posey Pronto, DO

## 2022-04-25 NOTE — Patient Instructions (Addendum)
Reduce duloxetine to '30mg'$  daily x 1 week, then stop.  If you have worsening pain, duloxetine '30mg'$  daily.  Stay on prednisone '5mg'$  daily  Return to clinic in 3 months

## 2022-04-30 ENCOUNTER — Other Ambulatory Visit: Payer: Self-pay | Admitting: Neurology

## 2022-05-15 ENCOUNTER — Other Ambulatory Visit: Payer: Self-pay | Admitting: Neurology

## 2022-07-19 ENCOUNTER — Telehealth: Payer: Self-pay | Admitting: Neurology

## 2022-07-19 NOTE — Telephone Encounter (Signed)
Pt's son called in stating he thinks the pt might be having another episode of the myasthenia gravis again. He said her speech is slurring and her left eye is drooping. He stated they have been having her on a high dosage of prednisone and have been lowering it. He is not sure what Dr. Allena Katz wants them to do?

## 2022-07-19 NOTE — Telephone Encounter (Signed)
Noted  

## 2022-07-19 NOTE — Telephone Encounter (Signed)
Called and spoke to patient son Nida Boatman. He stated that he noticed patients speech was a little slurred last week but thought it was due to her teeth. But yesterday Nida Boatman noticed patients eye is starting to droop. This morning Nida Boatman stated that patient had a hard time swallowing her morning pills. Nida Boatman stated that patient is taking 5 mg and has been since the last time they were told to reduce to 5 mg from 7.5 mg. Informed Brad that Dr. Allena Katz will need to evaluate patient in office and asked if patient can come tomorrow 07/20/22 at 1:10 PM? Nida Boatman stated he will get patient here.   I also informed Nida Boatman that if any of her symptoms worsen, if she cannot swallow, choking etc to please take her to the emergency room immediately. Brad verbalized understanding.

## 2022-07-19 NOTE — Telephone Encounter (Signed)
Please find how when symptoms started and what dose of prednisone she is taking and for how long. Best to be evaluated in the office.  OK to add to my schedule 4/12 at 1:10p.

## 2022-07-20 ENCOUNTER — Encounter: Payer: Self-pay | Admitting: Neurology

## 2022-07-20 ENCOUNTER — Ambulatory Visit (INDEPENDENT_AMBULATORY_CARE_PROVIDER_SITE_OTHER): Payer: Medicare Other | Admitting: Neurology

## 2022-07-20 VITALS — BP 135/71 | HR 91 | Ht 65.5 in | Wt 146.0 lb

## 2022-07-20 DIAGNOSIS — G7001 Myasthenia gravis with (acute) exacerbation: Secondary | ICD-10-CM | POA: Diagnosis not present

## 2022-07-20 MED ORDER — PREDNISONE 10 MG PO TABS: 10.0000 mg | ORAL_TABLET | Freq: Every day | ORAL | 3 refills | Status: DC

## 2022-07-20 NOTE — Progress Notes (Signed)
Follow-up Visit   Date: 07/20/22   Audrey Cowan MRN: [id number] DOB: 1941-02-22   Interim History: Audrey Cowan is a 82 y.o. right-handed Caucasian female with hypothyroidism, hyperlipidemia, anxiety/CKD stage3, hypertension, and vitamin B12 deficiency returning to the clinic for follow-up of myasthenia gravis. The patient was accompanied to the clinic by self.  IMPRESSION/PLAN: Seroposotive generalized myasthenia gravis with mild exacerbation (06/2019). Previously required monthly IVIG (3/221-03/2020) and highest dose of prednisone was 20mg /d.  She has been doing well on prednisone taper.  She does have mild left ptosis today and given her recent slurred speech, I will increase prednisone.  - Increase prednisone to 10mg  daily  - Continue mestinon 60mg  three times daily (7a, noon, and 5p).  Consider tapering this in the future   Peripheral neuropathy manifesting with numbness, stable  - Continue Cymbalta 60mg  daily  Return to clinic in 2 weeks  ----------------------------------------------------------- UPDATE 12/18/2021:  She as been doing well from a MG standpoint.  No double vision, ptosis, difficulty with speech/swallow, or weakness.  She has been on prednisone 7.5mg  daily.   She continues to be bothered by imbalance and numbness.  She uses a walker at all times.  She has been walking in the pool for balance exercises.  She remains frustrated with the numbness in the legs and feet.   UPDATE 04/25/2022:  She is here for follow-up visit.  She tapered her prednisone to 5mg  daily about 3 weeks ago and is tolerating this well. No double vision, droopy eyelids, difficulty swallowing/talking, or limb weakness.  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  PCP recently increase Cymbalta to 60mg /d, however, she has not noticed any improvement.  She denies pain or tingling in the limbs. Her son did not accompany her today as he is recovered  from knee surgery.  UPDATE 07/20/2022:  She is here for acute visit .  Over the past week, son has noticed mild slurred speech and two days ago, he noticed that her left eyelid was almost completely closed.  Yesterday, she slept most of the day and today, she is feeling well.  No spells of slurred vision, double vision, or weakness.  No fever, illness, or new medications.  Her good friend passed away over the weekend, so she has understandably been grieving this.    Medications:  Current Outpatient Medications on File Prior to Visit  Medication Sig Dispense Refill   ALPRAZolam (XANAX) 0.5 MG tablet Take 0.5 mg by mouth at bedtime. May take an additional tablet during the day if needed.     amLODipine (NORVASC) 10 MG tablet Take 1 tablet (10 mg total) by mouth daily. 30 tablet 1   aspirin EC 81 MG tablet Take 81 mg by mouth every other day. Nighttime med  Takes it Monday, Wednesday, and Friday.     atorvastatin (LIPITOR) 20 MG tablet Take 20 mg by mouth at bedtime.      Cholecalciferol (VITAMIN D) 50 MCG (2000 UT) tablet Take 2,000 Units by mouth at bedtime.     DULoxetine (CYMBALTA) 30 MG capsule TAKE 1 CAPSULE BY MOUTH EVERY DAY (Patient taking differently: 30 mg. Take 2 tablets daily.) 90 capsule 1   furosemide (LASIX) 20 MG tablet Take 10 mg by mouth daily. 1/2 tablet every other day     ibuprofen (ADVIL) 600 MG tablet Take 600 mg by mouth 3 (three) times daily.     levothyroxine (SYNTHROID) 88 MCG tablet  Take 88 mcg by mouth daily before breakfast.     pantoprazole (PROTONIX) 40 MG tablet Take 1 tablet (40 mg total) by mouth daily. 30 tablet 1   predniSONE (DELTASONE) 5 MG tablet Take one tablet daily. 90 tablet 1   pyridostigmine (MESTINON) 60 MG tablet TAKE 1 TABLET BY MOUTH AT 7AM, 1 TABLET AT NOON AND 1 TABLET AT 5PM 270 tablet 3   vitamin B-12 (CYANOCOBALAMIN) 1000 MCG tablet Take 1,000 mcg by mouth once a week. Mondays     No current facility-administered medications on file prior to  visit.    Allergies:  Allergies  Allergen Reactions   Other Other (See Comments)    Pt has Myasthenia gravis so all anesthesia must be pre-approved    Vital Signs:  BP 135/71   Pulse 91   Ht 5' 5.5" (1.664 m)   Wt 146 lb (66.2 kg)   SpO2 97%   BMI 23.93 kg/m    Neurological Exam: MENTAL STATUS including orientation to time, place, person, recent and remote memory, attention span and concentration, language, and fund of knowledge is normal.  Speech is clear, no dysarthria   CRANIAL NERVES:   Normal conjugate, extra-ocular eye movements in all directions of gaze.  Mild left ptosis at baseline no worsening  with sustained upgaze.  Oribicular oculi, buccinator, and orbicularis oris 5/5.   Tongue strength 5/5  MOTOR:  Motor strength is 5/5 in the arms and legs.  No fatigability.  No pronator drift.    MSRs:                                           Right        Left brachioradialis 2+  2+  biceps 2+  2+  triceps 2+  2+  patellar 2+  2+  ankle jerk 1+  1+   SENSATION:  Absent vibration below the ankles bilaterally, intact at the knees.  COORDINATION/GAIT:  Normal finger-to- nose-finger.  Gait appears stable, assisted with rollator   Data: Labs 06/23/2019:  AChR binding 57.1*, blocking 57*, modulating 59*  Lab Results  Component Value Date   VITAMINB12 1,073 (H) 03/27/2019   Total time spent reviewing records, interview, history/exam, documentation, and coordination of care on day of encounter:  30 min    Thank you for allowing me to participate in patient's care.  If I can answer any additional questions, I would be pleased to do so.    Sincerely,    Angelena Sand K. Allena Katz, DO

## 2022-07-20 NOTE — Patient Instructions (Signed)
Increase prednisone to 10mg  daily  I'll see you back in 2 weeks

## 2022-08-02 DIAGNOSIS — E039 Hypothyroidism, unspecified: Secondary | ICD-10-CM | POA: Diagnosis not present

## 2022-08-02 DIAGNOSIS — E538 Deficiency of other specified B group vitamins: Secondary | ICD-10-CM | POA: Diagnosis not present

## 2022-08-02 DIAGNOSIS — G629 Polyneuropathy, unspecified: Secondary | ICD-10-CM | POA: Diagnosis not present

## 2022-08-02 DIAGNOSIS — G7000 Myasthenia gravis without (acute) exacerbation: Secondary | ICD-10-CM | POA: Diagnosis not present

## 2022-08-02 DIAGNOSIS — I10 Essential (primary) hypertension: Secondary | ICD-10-CM | POA: Diagnosis not present

## 2022-08-02 DIAGNOSIS — L409 Psoriasis, unspecified: Secondary | ICD-10-CM | POA: Diagnosis not present

## 2022-08-02 DIAGNOSIS — F411 Generalized anxiety disorder: Secondary | ICD-10-CM | POA: Diagnosis not present

## 2022-08-02 DIAGNOSIS — I700 Atherosclerosis of aorta: Secondary | ICD-10-CM | POA: Diagnosis not present

## 2022-08-02 DIAGNOSIS — E7800 Pure hypercholesterolemia, unspecified: Secondary | ICD-10-CM | POA: Diagnosis not present

## 2022-08-02 DIAGNOSIS — N1831 Chronic kidney disease, stage 3a: Secondary | ICD-10-CM | POA: Diagnosis not present

## 2022-08-06 ENCOUNTER — Telehealth: Payer: Self-pay | Admitting: Neurology

## 2022-08-06 NOTE — Telephone Encounter (Signed)
Pt's son called in wanting to double check if the 08/07/22 appointment is necessary since the pt was just here on 07/20/22.

## 2022-08-07 ENCOUNTER — Ambulatory Visit: Payer: Medicare Other | Admitting: Neurology

## 2022-08-07 NOTE — Telephone Encounter (Signed)
Called and spoke to patients son Nida Boatman. Informed him of Dr. Eliane Decree recommendations. He stated patient is doing good. She doe shave some slurred speech in the morning but once she is fully awake it improves and is back to normal. Nida Boatman stated that he will call us if she has any of the symptoms explained. He would like to keep the follow up in July. He states that it will exhaust his mom to bring her all the way here for an appointment. He would like to see if it is okay with Dr. Allena Katz if he can keep the July follow up for his mother and will definitely call us if she needs to be seen sooner. He will keep a close eye on patient.

## 2022-08-07 NOTE — Telephone Encounter (Signed)
If she is doing better without signs of weakness, difficulty with speech/swallow, or droopy eyelids/double vision, then ok to schedule follow-up in 8 weeks.  Otherwise, we can add her to my next work-in appt in the next month.

## 2022-08-07 NOTE — Telephone Encounter (Signed)
That would be fine 

## 2022-09-02 ENCOUNTER — Other Ambulatory Visit: Payer: Self-pay | Admitting: Neurology

## 2022-10-08 ENCOUNTER — Other Ambulatory Visit: Payer: Self-pay | Admitting: Neurology

## 2022-10-23 ENCOUNTER — Ambulatory Visit (INDEPENDENT_AMBULATORY_CARE_PROVIDER_SITE_OTHER): Payer: Medicare Other | Admitting: Neurology

## 2022-10-23 ENCOUNTER — Encounter: Payer: Self-pay | Admitting: Neurology

## 2022-10-23 VITALS — BP 135/73 | HR 85 | Ht 65.5 in | Wt 145.0 lb

## 2022-10-23 DIAGNOSIS — G7000 Myasthenia gravis without (acute) exacerbation: Secondary | ICD-10-CM | POA: Diagnosis not present

## 2022-10-23 DIAGNOSIS — G629 Polyneuropathy, unspecified: Secondary | ICD-10-CM | POA: Diagnosis not present

## 2022-10-23 NOTE — Patient Instructions (Signed)
It was great to see you today!  Continue your medications as you are taking  I will see you back in 3 months

## 2022-10-23 NOTE — Progress Notes (Signed)
Follow-up Visit   Date: 10/23/22   Audrey Cowan MRN: [id number] DOB: Feb 16, 1941   Interim History: Audrey Cowan is a 82 y.o. right-handed Caucasian female with hypothyroidism, hyperlipidemia, anxiety/CKD stage3, hypertension, and vitamin B12 deficiency returning to the clinic for follow-up of myasthenia gravis. The patient was accompanied to the clinic by self.  IMPRESSION/PLAN: Seroposotive generalized myasthenia gravis without exacerbation (06/2019). Previously required monthly IVIG (3/221-03/2020) and highest dose of prednisone was 20mg /d.  No evidence of disease manifestation.   - Continue prednisone 10mg  daily  - Continue mestinon 60mg  three times daily (7a, noon, and 5p).   Peripheral neuropathy manifesting with numbness, stable  - Continue Cymbalta 60mg  daily  Return to clinic 3 months  ----------------------------------------------------------- UPDATE 12/18/2021:  She as been doing well from a MG standpoint.  No double vision, ptosis, difficulty with speech/swallow, or weakness.  She has been on prednisone 7.5mg  daily.   She continues to be bothered by imbalance and numbness.  She uses a walker at all times.  She has been walking in the pool for balance exercises.  She remains frustrated with the numbness in the legs and feet.   UPDATE 04/25/2022:  She is here for follow-up visit.  She tapered her prednisone to 5mg  daily about 3 weeks ago and is tolerating this well. No double vision, droopy eyelids, difficulty swallowing/talking, or limb weakness.  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  PCP recently increase Cymbalta to 60mg /d, however, she has not noticed any improvement.  She denies pain or tingling in the limbs. Her son did not accompany her today as he is recovered from knee surgery.  UPDATE 07/20/2022:  She is here for acute visit .  Over the past week, son has noticed mild slurred speech and two days ago, he  noticed that her left eyelid was almost completely closed.  Yesterday, she slept most of the day and today, she is feeling well.  No spells of slurred vision, double vision, or weakness.  No fever, illness, or new medications.  Her good friend passed away over the weekend, so she has understandably been grieving this.    UPDATE 10/23/2022:  She is here for follow-up visit.  Her droopy eyelids and slurred speech took about 3 weeks to improve after she was started on prednisone 10mg .  Since May, she has not had any breakthrough weakness.  She continues to be bothered by the numbness and tingling in the feet.  Her balance is poor and she is always using a walker.  She has not had any falls.    Medications:  Current Outpatient Medications on File Prior to Visit  Medication Sig Dispense Refill   ALPRAZolam (XANAX) 0.5 MG tablet Take 0.5 mg by mouth at bedtime. May take an additional tablet during the day if needed.     amLODipine (NORVASC) 10 MG tablet Take 1 tablet (10 mg total) by mouth daily. 30 tablet 1   aspirin EC 81 MG tablet Take 81 mg by mouth every other day. Nighttime med  Takes it Monday, Wednesday, and Friday.     atorvastatin (LIPITOR) 20 MG tablet Take 20 mg by mouth at bedtime.      Cholecalciferol (VITAMIN D) 50 MCG (2000 UT) tablet Take 2,000 Units by mouth at bedtime.     DULoxetine (CYMBALTA) 30 MG capsule TAKE 1 CAPSULE BY MOUTH EVERY DAY (Patient taking differently: 30 mg. Take 2 tablets daily.) 90 capsule 1  furosemide (LASIX) 20 MG tablet Take 10 mg by mouth daily. 1/2 tablet every other day     ibuprofen (ADVIL) 600 MG tablet Take 600 mg by mouth 3 (three) times daily.     levothyroxine (SYNTHROID) 88 MCG tablet Take 88 mcg by mouth daily before breakfast.     pantoprazole (PROTONIX) 40 MG tablet Take 1 tablet (40 mg total) by mouth daily. 30 tablet 1   predniSONE (DELTASONE) 10 MG tablet Take 1 tablet (10 mg total) by mouth daily with breakfast. 90 tablet 3   pyridostigmine  (MESTINON) 60 MG tablet TAKE 1 TABLET BY MOUTH AT 7AM, 1 TABLET AT NOON AND 1 TABLET AT 5PM 270 tablet 3   vitamin B-12 (CYANOCOBALAMIN) 1000 MCG tablet Take 1,000 mcg by mouth once a week. Mondays     No current facility-administered medications on file prior to visit.    Allergies:  Allergies  Allergen Reactions   Other Other (See Comments)    Pt has Myasthenia gravis so all anesthesia must be pre-approved    Vital Signs:  BP 135/73   Pulse 85   Ht 5' 5.5" (1.664 m)   Wt 145 lb (65.8 kg)   SpO2 95%   BMI 23.76 kg/m    Neurological Exam: MENTAL STATUS including orientation to time, place, person, recent and remote memory, attention span and concentration, language, and fund of knowledge is normal.  Speech is clear, no dysarthria   CRANIAL NERVES:   Normal conjugate, extra-ocular eye movements in all directions of gaze.  Mild left ptosis at baseline no worsening  with sustained upgaze.  Oribicular oculi, buccinator, and orbicularis oris 5/5.   Tongue strength 5/5  MOTOR:  Motor strength is 5/5 in the arms and legs.  Neck flexion is 5/5. No fatigability.  No pronator drift.    MSRs:                                           Right        Left brachioradialis 2+  2+  biceps 2+  2+  triceps 2+  2+  patellar 2+  2+  ankle jerk 1+  1+   SENSATION:  Absent vibration below the ankles bilaterally, intact at the knees.  COORDINATION/GAIT:  Normal finger-to- nose-finger.  Gait appears stable, assisted with rollator   Data: Labs 06/23/2019:  AChR binding 57.1*, blocking 57*, modulating 59*  Lab Results  Component Value Date   VITAMINB12 1,073 (H) 03/27/2019   Total time spent reviewing records, interview, history/exam, documentation, and coordination of care on day of encounter:  20 min     Thank you for allowing me to participate in patient's care.  If I can answer any additional questions, I would be pleased to do so.    Sincerely,    Valery Amedee K. Allena Katz, DO

## 2022-11-24 ENCOUNTER — Other Ambulatory Visit: Payer: Self-pay | Admitting: Neurology

## 2022-12-14 DIAGNOSIS — H26493 Other secondary cataract, bilateral: Secondary | ICD-10-CM | POA: Diagnosis not present

## 2022-12-14 DIAGNOSIS — Z961 Presence of intraocular lens: Secondary | ICD-10-CM | POA: Diagnosis not present

## 2022-12-25 DIAGNOSIS — Z23 Encounter for immunization: Secondary | ICD-10-CM | POA: Diagnosis not present

## 2023-01-14 DIAGNOSIS — Z23 Encounter for immunization: Secondary | ICD-10-CM | POA: Diagnosis not present

## 2023-01-20 ENCOUNTER — Other Ambulatory Visit: Payer: Self-pay | Admitting: Neurology

## 2023-01-24 DIAGNOSIS — H26491 Other secondary cataract, right eye: Secondary | ICD-10-CM | POA: Diagnosis not present

## 2023-01-24 DIAGNOSIS — H26492 Other secondary cataract, left eye: Secondary | ICD-10-CM | POA: Diagnosis not present

## 2023-02-11 DIAGNOSIS — Z85828 Personal history of other malignant neoplasm of skin: Secondary | ICD-10-CM | POA: Diagnosis not present

## 2023-02-11 DIAGNOSIS — L820 Inflamed seborrheic keratosis: Secondary | ICD-10-CM | POA: Diagnosis not present

## 2023-02-11 DIAGNOSIS — D692 Other nonthrombocytopenic purpura: Secondary | ICD-10-CM | POA: Diagnosis not present

## 2023-02-11 DIAGNOSIS — D3617 Benign neoplasm of peripheral nerves and autonomic nervous system of trunk, unspecified: Secondary | ICD-10-CM | POA: Diagnosis not present

## 2023-02-11 DIAGNOSIS — D0461 Carcinoma in situ of skin of right upper limb, including shoulder: Secondary | ICD-10-CM | POA: Diagnosis not present

## 2023-02-11 DIAGNOSIS — C44329 Squamous cell carcinoma of skin of other parts of face: Secondary | ICD-10-CM | POA: Diagnosis not present

## 2023-02-11 DIAGNOSIS — L821 Other seborrheic keratosis: Secondary | ICD-10-CM | POA: Diagnosis not present

## 2023-02-11 DIAGNOSIS — D485 Neoplasm of uncertain behavior of skin: Secondary | ICD-10-CM | POA: Diagnosis not present

## 2023-02-11 DIAGNOSIS — L720 Epidermal cyst: Secondary | ICD-10-CM | POA: Diagnosis not present

## 2023-02-11 DIAGNOSIS — L817 Pigmented purpuric dermatosis: Secondary | ICD-10-CM | POA: Diagnosis not present

## 2023-02-11 DIAGNOSIS — L570 Actinic keratosis: Secondary | ICD-10-CM | POA: Diagnosis not present

## 2023-02-11 DIAGNOSIS — L814 Other melanin hyperpigmentation: Secondary | ICD-10-CM | POA: Diagnosis not present

## 2023-02-18 DIAGNOSIS — Z0000 Encounter for general adult medical examination without abnormal findings: Secondary | ICD-10-CM | POA: Diagnosis not present

## 2023-02-18 DIAGNOSIS — I700 Atherosclerosis of aorta: Secondary | ICD-10-CM | POA: Diagnosis not present

## 2023-02-18 DIAGNOSIS — F411 Generalized anxiety disorder: Secondary | ICD-10-CM | POA: Diagnosis not present

## 2023-02-18 DIAGNOSIS — N1831 Chronic kidney disease, stage 3a: Secondary | ICD-10-CM | POA: Diagnosis not present

## 2023-02-18 DIAGNOSIS — Z1331 Encounter for screening for depression: Secondary | ICD-10-CM | POA: Diagnosis not present

## 2023-02-18 DIAGNOSIS — E7800 Pure hypercholesterolemia, unspecified: Secondary | ICD-10-CM | POA: Diagnosis not present

## 2023-02-18 DIAGNOSIS — E039 Hypothyroidism, unspecified: Secondary | ICD-10-CM | POA: Diagnosis not present

## 2023-02-18 DIAGNOSIS — G7000 Myasthenia gravis without (acute) exacerbation: Secondary | ICD-10-CM | POA: Diagnosis not present

## 2023-02-18 DIAGNOSIS — I10 Essential (primary) hypertension: Secondary | ICD-10-CM | POA: Diagnosis not present

## 2023-02-19 ENCOUNTER — Ambulatory Visit (INDEPENDENT_AMBULATORY_CARE_PROVIDER_SITE_OTHER): Payer: Medicare Other | Admitting: Neurology

## 2023-02-19 ENCOUNTER — Encounter: Payer: Self-pay | Admitting: Neurology

## 2023-02-19 VITALS — BP 117/63 | HR 89 | Ht 65.5 in | Wt 149.0 lb

## 2023-02-19 DIAGNOSIS — G629 Polyneuropathy, unspecified: Secondary | ICD-10-CM

## 2023-02-19 DIAGNOSIS — R292 Abnormal reflex: Secondary | ICD-10-CM

## 2023-02-19 DIAGNOSIS — G7000 Myasthenia gravis without (acute) exacerbation: Secondary | ICD-10-CM | POA: Diagnosis not present

## 2023-02-19 DIAGNOSIS — Z981 Arthrodesis status: Secondary | ICD-10-CM | POA: Diagnosis not present

## 2023-02-19 NOTE — Patient Instructions (Signed)
MRI lumbar spine will be ordered

## 2023-02-19 NOTE — Progress Notes (Signed)
Follow-up Visit   Date: 02/19/23   Audrey Cowan MRN: [id number] DOB: Nov 05, 1940   Interim History: Audrey Cowan is a 82 y.o. right-handed Caucasian female with hypothyroidism, hyperlipidemia, anxiety/CKD stage3, hypertension, and vitamin B12 deficiency returning to the clinic for follow-up of myasthenia gravis. The patient was accompanied to the clinic by self.  IMPRESSION/PLAN: Seroposotive generalized myasthenia gravis without exacerbation (06/2019). Previously required monthly IVIG (3/221-03/2020) and highest dose of prednisone was 20mg /d.  No evidence of disease manifestation.   - Continue prednisone 10mg  daily  - Continue mestinon 60mg  three times daily (7a, noon, and 5p).   Peripheral neuropathy manifesting with numbness, stable  - Continue Cymbalta 60mg  daily  3.  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  Exam with increased reflexes at the knee  - MRI lumbar spine wo contrast  Return to clinic 3 months  ----------------------------------------------------------- UPDATE 12/18/2021:  She as been doing well from a MG standpoint.  No double vision, ptosis, difficulty with speech/swallow, or weakness.  She has been on prednisone 7.5mg  daily.   She continues to be bothered by imbalance and numbness.  She uses a walker at all times.  She has been walking in the pool for balance exercises.  She remains frustrated with the numbness in the legs and feet.   UPDATE 04/25/2022:  She is here for follow-up visit.  She tapered her prednisone to 5mg  daily about 3 weeks ago and is tolerating this well. No double vision, droopy eyelids, difficulty swallowing/talking, or limb weakness.  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  PCP recently increase Cymbalta to 60mg /d, however, she has not noticed any improvement.  She denies pain or tingling in the limbs. Her son did not accompany her today as he is recovered from knee  surgery.  UPDATE 07/20/2022:  She is here for acute visit .  Over the past week, son has noticed mild slurred speech and two days ago, he noticed that her left eyelid was almost completely closed.  Yesterday, she slept most of the day and today, she is feeling well.  No spells of slurred vision, double vision, or weakness.  No fever, illness, or new medications.  Her good friend passed away over the weekend, so she has understandably been grieving this.    UPDATE 10/23/2022:  She is here for follow-up visit.  Her droopy eyelids and slurred speech took about 3 weeks to improve after she was started on prednisone 10mg .  Since May, she has not had any breakthrough weakness.  She continues to be bothered by the numbness and tingling in the feet.  Her balance is poor and she is always using a walker.  She has not had any falls.   UPDATE 02/19/2023:  She is here for follow-up visit.   She reports having leg weakness and heaviness with prolonged standing and walking.  In the evenings, she has a harder time trying to get up from her chair and walk to the bedroom.  She continues to have numbness/tingling in the lower legs and feet.  Her knees always feel cold.  She is compliant with using a rollator.  She denies low back pain or radicular leg pain.  No double vision, difficulty with speech/swallow.   Medications:  Current Outpatient Medications on File Prior to Visit  Medication Sig Dispense Refill   ALPRAZolam (XANAX) 0.5 MG tablet Take 0.5 mg by mouth at bedtime. May take an additional tablet  during the day if needed.     amLODipine (NORVASC) 10 MG tablet Take 1 tablet (10 mg total) by mouth daily. 30 tablet 1   aspirin EC 81 MG tablet Take 81 mg by mouth every other day. Nighttime med  Takes it Monday, Wednesday, and Friday.     atorvastatin (LIPITOR) 20 MG tablet Take 20 mg by mouth at bedtime.      Cholecalciferol (VITAMIN D) 50 MCG (2000 UT) tablet Take 2,000 Units by mouth at bedtime.     DULoxetine  (CYMBALTA) 30 MG capsule TAKE 1 CAPSULE BY MOUTH EVERY DAY (Patient taking differently: 30 mg. Take 2 tablets daily.) 90 capsule 1   furosemide (LASIX) 20 MG tablet Take 10 mg by mouth daily. 1/2 tablet every other day     ibuprofen (ADVIL) 600 MG tablet Take 600 mg by mouth 3 (three) times daily.     levothyroxine (SYNTHROID) 88 MCG tablet Take 88 mcg by mouth daily before breakfast.     pantoprazole (PROTONIX) 40 MG tablet Take 1 tablet (40 mg total) by mouth daily. 30 tablet 1   predniSONE (DELTASONE) 10 MG tablet Take 1 tablet (10 mg total) by mouth daily with breakfast. 90 tablet 3   pyridostigmine (MESTINON) 60 MG tablet TAKE 1 TABLET BY MOUTH AT 7AM, 1 TABLET AT NOON AND 1 TABLET AT 5PM 270 tablet 3   vitamin B-12 (CYANOCOBALAMIN) 1000 MCG tablet Take 1,000 mcg by mouth once a week. Mondays     No current facility-administered medications on file prior to visit.    Allergies:  Allergies  Allergen Reactions   Other Other (See Comments)    Pt has Myasthenia gravis so all anesthesia must be pre-approved    Vital Signs:  BP 117/63   Pulse 89   Ht 5' 5.5" (1.664 m)   Wt 149 lb (67.6 kg)   SpO2 96%   BMI 24.42 kg/m    Neurological Exam: MENTAL STATUS including orientation to time, place, person, recent and remote memory, attention span and concentration, language, and fund of knowledge is normal.  Speech is clear, no dysarthria   CRANIAL NERVES:   Normal conjugate, extra-ocular eye movements in all directions of gaze.  Mild left ptosis at baseline no worsening  with sustained upgaze.  Oribicular oculi, buccinator, and orbicularis oris 5/5.   Tongue strength 5/5  MOTOR:  Motor strength is 5/5 in the arms and legs.  Neck flexion is 5/5. No fatigability.  No pronator drift.    MSRs:                                           Right        Left brachioradialis 2+  2+  biceps 2+  2+  triceps 2+  2+  patellar 3+  3+  ankle jerk 1+  1+   SENSATION:  Absent vibration below the  ankles bilaterally, intact at the knees.  COORDINATION/GAIT:  Normal finger-to- nose-finger.  Gait appears stable, assisted with rollator   Data: Labs 06/23/2019:  AChR binding 57.1*, blocking 57*, modulating 59*  Lab Results  Component Value Date   VITAMINB12 1,073 (H) 03/27/2019      Thank you for allowing me to participate in patient's care.  If I can answer any additional questions, I would be pleased to do so.    Sincerely,    Ronella Plunk K.  Allena Katz, DO

## 2023-02-25 ENCOUNTER — Telehealth: Payer: Self-pay | Admitting: Neurology

## 2023-02-25 NOTE — Telephone Encounter (Signed)
Pt states that she has not heard anything from the MRI  place and she wants to check the status of that

## 2023-02-25 NOTE — Telephone Encounter (Signed)
Called and spoke to [place] (patients son ) and provided him with [facility] imaging phone number to get scheduled.

## 2023-03-17 ENCOUNTER — Ambulatory Visit
Admission: RE | Admit: 2023-03-17 | Discharge: 2023-03-17 | Disposition: A | Discharge status: Home / Self Care | Payer: Medicare Other | Source: Ambulatory Visit | Attending: Neurology | Admitting: Neurology

## 2023-03-17 DIAGNOSIS — M5125 Other intervertebral disc displacement, thoracolumbar region: Secondary | ICD-10-CM | POA: Diagnosis not present

## 2023-03-17 DIAGNOSIS — G7000 Myasthenia gravis without (acute) exacerbation: Secondary | ICD-10-CM

## 2023-03-17 DIAGNOSIS — Z981 Arthrodesis status: Secondary | ICD-10-CM

## 2023-03-17 DIAGNOSIS — R292 Abnormal reflex: Secondary | ICD-10-CM

## 2023-03-17 DIAGNOSIS — G629 Polyneuropathy, unspecified: Secondary | ICD-10-CM

## 2023-03-17 DIAGNOSIS — M5135 Other intervertebral disc degeneration, thoracolumbar region: Secondary | ICD-10-CM | POA: Diagnosis not present

## 2023-03-20 ENCOUNTER — Other Ambulatory Visit: Payer: Self-pay | Admitting: Family Medicine

## 2023-03-20 ENCOUNTER — Telehealth: Payer: Self-pay | Admitting: Neurology

## 2023-03-20 DIAGNOSIS — Z1231 Encounter for screening mammogram for malignant neoplasm of breast: Secondary | ICD-10-CM

## 2023-03-20 NOTE — Telephone Encounter (Signed)
Pt called in wanting to get her MRI results

## 2023-03-20 NOTE — Telephone Encounter (Signed)
Called patient and spoke to her son Nida Boatman and informed him that  it is taking 2-4 weeks to get results back from radiology.  Once we have them, we will notify her. Patients son verbalized understanding and had no further questions or concerns.

## 2023-03-25 ENCOUNTER — Telehealth: Payer: Self-pay | Admitting: Neurology

## 2023-03-25 NOTE — Telephone Encounter (Signed)
Called Radiology and asked for MRI Lumbar to be read. Pending review.

## 2023-03-25 NOTE — Telephone Encounter (Signed)
Pt called in and left a message with the access nurse. She is wanting her MRI results

## 2023-03-25 NOTE — Telephone Encounter (Signed)
See result note.  

## 2023-03-28 ENCOUNTER — Other Ambulatory Visit: Payer: Self-pay

## 2023-03-28 DIAGNOSIS — G7001 Myasthenia gravis with (acute) exacerbation: Secondary | ICD-10-CM

## 2023-03-28 DIAGNOSIS — Z981 Arthrodesis status: Secondary | ICD-10-CM

## 2023-04-17 ENCOUNTER — Ambulatory Visit
Admission: RE | Admit: 2023-04-17 | Discharge: 2023-04-17 | Disposition: A | Discharge status: Home / Self Care | Payer: Medicare Other | Source: Ambulatory Visit | Attending: Family Medicine | Admitting: Family Medicine

## 2023-04-17 DIAGNOSIS — Z1231 Encounter for screening mammogram for malignant neoplasm of breast: Secondary | ICD-10-CM

## 2023-05-01 ENCOUNTER — Telehealth: Payer: Self-pay | Admitting: Neurology

## 2023-05-01 NOTE — Telephone Encounter (Signed)
Called and spoke to patients son, Audrey Cowan. He has scheduled an appointment for patient with [facility] and they asked him to call us to make sure we send over the MRI and notes. I have printed and re faxed this for patient to [facility]. Patients son verbalized understanding and had no further questions or concerns.

## 2023-05-01 NOTE — Telephone Encounter (Signed)
Patient had Mri with [facility], they told her she needed to see her back surgeon. They set the appt up in feb, but they want the referral/mri information.

## 2023-05-23 DIAGNOSIS — M5416 Radiculopathy, lumbar region: Secondary | ICD-10-CM | POA: Diagnosis not present

## 2023-05-23 DIAGNOSIS — M4316 Spondylolisthesis, lumbar region: Secondary | ICD-10-CM | POA: Diagnosis not present

## 2023-06-18 ENCOUNTER — Other Ambulatory Visit: Payer: Self-pay | Admitting: Neurology

## 2023-07-08 DIAGNOSIS — E039 Hypothyroidism, unspecified: Secondary | ICD-10-CM | POA: Diagnosis not present

## 2023-07-08 DIAGNOSIS — N1831 Chronic kidney disease, stage 3a: Secondary | ICD-10-CM | POA: Diagnosis not present

## 2023-07-08 DIAGNOSIS — E7800 Pure hypercholesterolemia, unspecified: Secondary | ICD-10-CM | POA: Diagnosis not present

## 2023-07-08 DIAGNOSIS — F411 Generalized anxiety disorder: Secondary | ICD-10-CM | POA: Diagnosis not present

## 2023-07-16 ENCOUNTER — Ambulatory Visit (INDEPENDENT_AMBULATORY_CARE_PROVIDER_SITE_OTHER): Payer: Medicare Other | Admitting: Neurology

## 2023-07-16 ENCOUNTER — Encounter: Payer: Self-pay | Admitting: Neurology

## 2023-07-16 VITALS — BP 146/72 | HR 86 | Ht 65.5 in | Wt 152.0 lb

## 2023-07-16 DIAGNOSIS — G7000 Myasthenia gravis without (acute) exacerbation: Secondary | ICD-10-CM | POA: Diagnosis not present

## 2023-07-16 DIAGNOSIS — G629 Polyneuropathy, unspecified: Secondary | ICD-10-CM

## 2023-07-16 MED ORDER — PREDNISONE 10 MG PO TABS: 10.0000 mg | ORAL_TABLET | Freq: Every day | ORAL | 3 refills | Status: DC

## 2023-07-16 NOTE — Progress Notes (Signed)
 Follow-up Visit   Date: 07/16/23   Audrey Cowan MRN: [id number] DOB: [birth year]   Interim History: Audrey Cowan is a 83 y.o. right-handed Caucasian female with hypothyroidism, hyperlipidemia, anxiety/CKD stage3, hypertension, and vitamin B12 deficiency returning to the clinic for follow-up of myasthenia gravis. The patient was accompanied to the clinic by self.  IMPRESSION/PLAN: Seropositive generalized myasthenia gravis without exacerbation (06/2019). Previously required monthly IVIG (3/221-03/2020) and highest dose of prednisone was 20mg /d.  No evidence of disease manifestation.   - Continue prednisone 10mg  daily  - Continue mestinon 60mg  three times daily (7a, noon, and 5p).   Peripheral neuropathy manifesting with numbness, stable  - Reduce duloxetine to 30mg  daily, if pain gets worse, go back to taking 60mg  daily  3.  Bilateral thigh pain, no evidence of lumbosacral pathology on MRI lumbar spine.  Symptoms are not due to neuropathy.  Recommend that she see orthopaedics or Sports Medicine for their opinion.   Return to clinic 3 months  ----------------------------------------------------------- UPDATE 12/18/2021:  She as been doing well from a MG standpoint.  No double vision, ptosis, difficulty with speech/swallow, or weakness.  She has been on prednisone 7.5mg  daily.   She continues to be bothered by imbalance and numbness.  She uses a walker at all times.  She has been walking in the pool for balance exercises.  She remains frustrated with the numbness in the legs and feet.   UPDATE 04/25/2022:  She is here for follow-up visit.  She tapered her prednisone to 5mg  daily about 3 weeks ago and is tolerating this well. No double vision, droopy eyelids, difficulty swallowing/talking, or limb weakness.  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  PCP recently increase Cymbalta to 60mg /d, however, she has not noticed any improvement.   She denies pain or tingling in the limbs. Her son did not accompany her today as he is recovered from knee surgery.  UPDATE 07/20/2022:  She is here for acute visit .  Over the past week, son has noticed mild slurred speech and two days ago, he noticed that her left eyelid was almost completely closed.  Yesterday, she slept most of the day and today, she is feeling well.  No spells of slurred vision, double vision, or weakness.  No fever, illness, or new medications.  Her good friend passed away over the weekend, so she has understandably been grieving this.    UPDATE 10/23/2022:  She is here for follow-up visit.  Her droopy eyelids and slurred speech took about 3 weeks to improve after she was started on prednisone 10mg .  Since May, she has not had any breakthrough weakness.  She continues to be bothered by the numbness and tingling in the feet.  Her balance is poor and she is always using a walker.  She has not had any falls.   UPDATE 02/19/2023:  She is here for follow-up visit.   She reports having leg weakness and heaviness with prolonged standing and walking.  In the evenings, she has a harder time trying to get up from her chair and walk to the bedroom.  She continues to have numbness/tingling in the lower legs and feet.  Her knees always feel cold.  She is compliant with using a rollator.  She denies low back pain or radicular leg pain.  No double vision, difficulty with speech/swallow.   UPDATE 07/16/2023:  She is here for follow-up visit with her son.  She  continues to have bilateral achy/throbbing leg pain, worse in the hip, thigh, and medial knee.  She also feels that there is some swelling associated with it.  MRI lumbar spine was previously completed which did not have any structural pathology.  Her neuropathy remains in the feet and lower legs with numbness/tingling. She takes duloxetine 60mg  and feels that it does not help, so would like to stop the medication.  Myasthenia is well-controlled.  No  new issues with double vision, speech/swallow, or limb weakness.    Medications:  Current Outpatient Medications on File Prior to Visit  Medication Sig Dispense Refill   ALPRAZolam (XANAX) 0.5 MG tablet Take 0.5 mg by mouth at bedtime. May take an additional tablet during the day if needed.     amLODipine (NORVASC) 10 MG tablet Take 1 tablet (10 mg total) by mouth daily. 30 tablet 1   aspirin EC 81 MG tablet Take 81 mg by mouth every other day. Nighttime med  Takes it Monday, Wednesday, and Friday.     atorvastatin (LIPITOR) 20 MG tablet Take 20 mg by mouth at bedtime.      Cholecalciferol (VITAMIN D) 50 MCG (2000 UT) tablet Take 2,000 Units by mouth at bedtime.     DULoxetine (CYMBALTA) 30 MG capsule TAKE 1 CAPSULE BY MOUTH EVERY DAY (Patient taking differently: 30 mg. Take 2 tablets daily.) 90 capsule 1   furosemide (LASIX) 20 MG tablet Take 10 mg by mouth daily. 1/2 tablet every other day     ibuprofen (ADVIL) 600 MG tablet Take 600 mg by mouth 3 (three) times daily.     levothyroxine (SYNTHROID) 88 MCG tablet Take 88 mcg by mouth daily before breakfast.     predniSONE (DELTASONE) 10 MG tablet Take 1 tablet (10 mg total) by mouth daily with breakfast. 90 tablet 3   pyridostigmine (MESTINON) 60 MG tablet TAKE 1 TABLET BY MOUTH AT 7AM, 1 TABLET AT NOON AND 1 TABLET AT 5PM 180 tablet 0   vitamin B-12 (CYANOCOBALAMIN) 1000 MCG tablet Take 1,000 mcg by mouth once a week. Mondays     pantoprazole (PROTONIX) 40 MG tablet Take 1 tablet (40 mg total) by mouth daily. 30 tablet 1   No current facility-administered medications on file prior to visit.    Allergies:  Allergies  Allergen Reactions   Other Other (See Comments)    Pt has Myasthenia gravis so all anesthesia must be pre-approved    Vital Signs:  BP (!) 146/72   Pulse 86   Ht 5' 5.5" (1.664 m)   Wt 152 lb (68.9 kg)   SpO2 97%   BMI 24.91 kg/m    Neurological Exam: MENTAL STATUS including orientation to time, place, person,  recent and remote memory, attention span and concentration, language, and fund of knowledge is normal.  Speech is clear, no dysarthria   CRANIAL NERVES:   Normal conjugate, extra-ocular eye movements in all directions of gaze.  Mild left ptosis at baseline no worsening  with sustained upgaze.  Oribicular oculi, buccinator, and orbicularis oris 5/5.   Tongue strength 5/5  MOTOR:  Motor strength is 5/5 in the arms and legs.  Neck flexion is 5/5. No fatigability.  No pronator drift.    MSRs:                                           Right  Left brachioradialis 2+  2+  biceps 2+  2+  triceps 2+  2+  patellar 3+  3+  ankle jerk 1+  1+   SENSATION:  Absent vibration below the ankles bilaterally, intact at the knees.  COORDINATION/GAIT:  Normal finger-to- nose-finger.  Gait appears stable, assisted with rollator   Data: Labs 06/23/2019:  AChR binding 57.1*, blocking 57*, modulating 59*  Lab Results  Component Value Date   VITAMINB12 1,073 (H) 03/27/2019      Thank you for allowing me to participate in patient's care.  If I can answer any additional questions, I would be pleased to do so.    Sincerely,    Ysenia Filice K. Allena Katz, DO

## 2023-07-16 NOTE — Patient Instructions (Signed)
 Continue prednisone 10mg  daily Continue mestinon 60mg  three times daily Reduce duloxetine to 30mg  daily

## 2023-07-17 ENCOUNTER — Other Ambulatory Visit: Payer: Self-pay | Admitting: Neurology

## 2023-08-21 DIAGNOSIS — Z23 Encounter for immunization: Secondary | ICD-10-CM | POA: Diagnosis not present

## 2023-08-21 DIAGNOSIS — F411 Generalized anxiety disorder: Secondary | ICD-10-CM | POA: Diagnosis not present

## 2023-08-21 DIAGNOSIS — E039 Hypothyroidism, unspecified: Secondary | ICD-10-CM | POA: Diagnosis not present

## 2023-08-21 DIAGNOSIS — M25551 Pain in right hip: Secondary | ICD-10-CM | POA: Diagnosis not present

## 2023-08-21 DIAGNOSIS — G7000 Myasthenia gravis without (acute) exacerbation: Secondary | ICD-10-CM | POA: Diagnosis not present

## 2023-08-21 DIAGNOSIS — I10 Essential (primary) hypertension: Secondary | ICD-10-CM | POA: Diagnosis not present

## 2023-08-21 DIAGNOSIS — N1831 Chronic kidney disease, stage 3a: Secondary | ICD-10-CM | POA: Diagnosis not present

## 2023-08-21 DIAGNOSIS — E7800 Pure hypercholesterolemia, unspecified: Secondary | ICD-10-CM | POA: Diagnosis not present

## 2023-08-21 DIAGNOSIS — M79669 Pain in unspecified lower leg: Secondary | ICD-10-CM | POA: Diagnosis not present

## 2023-08-27 DIAGNOSIS — M25551 Pain in right hip: Secondary | ICD-10-CM | POA: Diagnosis not present

## 2023-08-27 DIAGNOSIS — M898X5 Other specified disorders of bone, thigh: Secondary | ICD-10-CM | POA: Diagnosis not present

## 2023-09-23 DIAGNOSIS — M542 Cervicalgia: Secondary | ICD-10-CM | POA: Diagnosis not present

## 2023-09-30 DIAGNOSIS — M542 Cervicalgia: Secondary | ICD-10-CM | POA: Diagnosis not present

## 2023-10-08 DIAGNOSIS — M542 Cervicalgia: Secondary | ICD-10-CM | POA: Diagnosis not present

## 2023-10-12 ENCOUNTER — Other Ambulatory Visit: Payer: Self-pay | Admitting: Neurology

## 2023-10-17 ENCOUNTER — Other Ambulatory Visit: Payer: Self-pay | Admitting: Neurology

## 2023-11-19 ENCOUNTER — Encounter: Payer: Self-pay | Admitting: Neurology

## 2023-11-19 ENCOUNTER — Ambulatory Visit (INDEPENDENT_AMBULATORY_CARE_PROVIDER_SITE_OTHER): Admitting: Neurology

## 2023-11-19 VITALS — BP 144/74 | HR 103 | Ht 65.0 in | Wt 152.0 lb

## 2023-11-19 DIAGNOSIS — G629 Polyneuropathy, unspecified: Secondary | ICD-10-CM

## 2023-11-19 DIAGNOSIS — G7000 Myasthenia gravis without (acute) exacerbation: Secondary | ICD-10-CM

## 2023-11-19 MED ORDER — PREDNISONE 1 MG PO TABS: ORAL_TABLET | ORAL | 5 refills | Status: DC

## 2023-11-19 MED ORDER — PYRIDOSTIGMINE BROMIDE 60 MG PO TABS: ORAL_TABLET | ORAL | 3 refills | Status: AC

## 2023-11-19 MED ORDER — DULOXETINE HCL 30 MG PO CPEP: 30.0000 mg | ORAL_CAPSULE | Freq: Every day | ORAL | 3 refills | Status: AC

## 2023-11-19 NOTE — Progress Notes (Signed)
 Follow-up Visit   Date: 11/19/23   Audrey Cowan MRN: [id number] DOB: February 11, 1941   Interim History: Audrey Cowan is a 83 y.o. right-handed Caucasian female with hypothyroidism, hyperlipidemia, anxiety/CKD stage3, hypertension, and vitamin B12 deficiency returning to the clinic for follow-up of myasthenia gravis. The patient was accompanied to the clinic by self.  IMPRESSION/PLAN: Seropositive generalized myasthenia gravis without exacerbation (06/2019).  Previously required monthly IVIG (3/221-03/2020) and highest dose of prednisone  was 20mg /d.  No evidence of disease manifestation. She has been on prednisone  10mg  daily for quite some time, so will start slow taper.   - Reduce prednisone  to 9mg  daily x 1 month, then 8mg  daily and stay on this until further instruction  - Continue mestinon  60mg  three times daily (7a, noon, and 5p).   - Send Mychart update in 2 months to determine further tapering  Peripheral neuropathy manifesting with numbness, stable  - Continue duloxetine  30mg  daily  Return to clinic 3 months  ----------------------------------------------------------- UPDATE 12/18/2021:  She as been doing well from a MG standpoint.  No double vision, ptosis, difficulty with speech/swallow, or weakness.  She has been on prednisone  7.5mg  daily.   She continues to be bothered by imbalance and numbness.  She uses a walker at all times.  She has been walking in the pool for balance exercises.  She remains frustrated with the numbness in the legs and feet.   UPDATE 04/25/2022:  She is here for follow-up visit.  She tapered her prednisone  to 5mg  daily about 3 weeks ago and is tolerating this well. No double vision, droopy eyelids, difficulty swallowing/talking, or limb weakness.  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  PCP recently increase Cymbalta  to 60mg /d, however, she has not noticed any improvement.  She denies pain or tingling  in the limbs. Her son did not accompany her today as he is recovered from knee surgery.  UPDATE 07/20/2022:  She is here for acute visit .  Over the past week, son has noticed mild slurred speech and two days ago, he noticed that her left eyelid was almost completely closed.  Yesterday, she slept most of the day and today, she is feeling well.  No spells of slurred vision, double vision, or weakness.  No fever, illness, or new medications.  Her good friend passed away over the weekend, so she has understandably been grieving this.    UPDATE 10/23/2022:  She is here for follow-up visit.  Her droopy eyelids and slurred speech took about 3 weeks to improve after she was started on prednisone  10mg .  Since May, she has not had any breakthrough weakness.  She continues to be bothered by the numbness and tingling in the feet.  Her balance is poor and she is always using a walker.  She has not had any falls.   UPDATE 02/19/2023:  She is here for follow-up visit.   She reports having leg weakness and heaviness with prolonged standing and walking.  In the evenings, she has a harder time trying to get up from her chair and walk to the bedroom.  She continues to have numbness/tingling in the lower legs and feet.  Her knees always feel cold.  She is compliant with using a rollator.  She denies low back pain or radicular leg pain.  No double vision, difficulty with speech/swallow.   UPDATE 07/16/2023:  She is here for follow-up visit with her son.  She continues to have  bilateral achy/throbbing leg pain, worse in the hip, thigh, and medial knee.  She also feels that there is some swelling associated with it.  MRI lumbar spine was previously completed which did not have any structural pathology.  Her neuropathy remains in the feet and lower legs with numbness/tingling. She takes duloxetine  60mg  and feels that it does not help, so would like to stop the medication.  Myasthenia is well-controlled.  No new issues with double  vision, speech/swallow, or limb weakness.    UPDATE 11/19/2023:  She is here for follow-up visit.  Her myasthenia gravis is well-controlled on prednisone  10mg  and mestinon  60mg  three times daily.  No difficulty with speech/swallow, double vision, droopy eyelids, or limb weakness.  She did not have any worsening pain after reducing duloxetine  to 30mg  daily.  She is expressing her frustration with healthcare because she feels that as she ages, less care is being delivered, such as no longer requiring colonoscopy after the age of 67.  Her son, Audrey Cowan, is currently getting treatment for prostate cancer and was unable to accompany her to the visit today.   Medications:  Current Outpatient Medications on File Prior to Visit  Medication Sig Dispense Refill   ALPRAZolam  (XANAX ) 0.5 MG tablet Take 0.5 mg by mouth at bedtime. May take an additional tablet during the day if needed.     amLODipine  (NORVASC ) 10 MG tablet Take 1 tablet (10 mg total) by mouth daily. 30 tablet 1   aspirin  EC 81 MG tablet Take 81 mg by mouth every other day. Nighttime med  Takes it Monday, Wednesday, and Friday.     atorvastatin  (LIPITOR) 20 MG tablet Take 20 mg by mouth at bedtime.      Cholecalciferol  (VITAMIN D ) 50 MCG (2000 UT) tablet Take 2,000 Units by mouth at bedtime.     DULoxetine  (CYMBALTA ) 30 MG capsule TAKE 1 CAPSULE BY MOUTH EVERY DAY (Patient taking differently: 30 mg. Take 2 tablets daily.) 90 capsule 1   furosemide  (LASIX ) 20 MG tablet Take 10 mg by mouth daily. 1/2 tablet every other day     ibuprofen (ADVIL) 600 MG tablet Take 600 mg by mouth 3 (three) times daily.     levothyroxine  (SYNTHROID ) 88 MCG tablet Take 88 mcg by mouth daily before breakfast.     pantoprazole  (PROTONIX ) 40 MG tablet Take 1 tablet (40 mg total) by mouth daily. 30 tablet 1   predniSONE  (DELTASONE ) 10 MG tablet Take 1 tablet (10 mg total) by mouth daily with breakfast. 90 tablet 3   pyridostigmine  (MESTINON ) 60 MG tablet TAKE 1 TABLET BY  MOUTH AT 7AM, 1 TABLET AT NOON AND 1 TABLET AT 5PM 270 tablet 0   vitamin B-12 (CYANOCOBALAMIN ) 1000 MCG tablet Take 1,000 mcg by mouth once a week. Mondays     No current facility-administered medications on file prior to visit.    Allergies:  Allergies  Allergen Reactions   Other Other (See Comments)    Pt has Myasthenia gravis so all anesthesia must be pre-approved    Vital Signs:  BP (!) 144/74 (BP Location: Left Arm, Patient Position: Sitting)   Pulse (!) 103   Ht 5' 5 (1.651 m)   Wt 152 lb (68.9 kg)   SpO2 96%   BMI 25.29 kg/m    Neurological Exam: MENTAL STATUS including orientation to time, place, person, recent and remote memory, attention span and concentration, language, and fund of knowledge is normal.  Speech is clear, no dysarthria   CRANIAL  NERVES:   Normal conjugate, extra-ocular eye movements in all directions of gaze.  Mild left ptosis at baseline no worsening  with sustained upgaze.  Oribicular oculi, buccinator, and orbicularis oris 5/5.   Tongue strength 5/5  MOTOR:  Motor strength is 5/5 in the arms and legs.  Neck flexion is 5/5. No fatigability.  No pronator drift.    MSRs:                                           Right        Left brachioradialis 2+  2+  biceps 2+  2+  triceps 2+  2+  patellar 3+  3+  ankle jerk 1+  1+   SENSATION:  Absent vibration below the ankles bilaterally, intact at the knees.  COORDINATION/GAIT:  Normal finger-to- nose-finger.  Gait appears stable, there is mild external rotation to the legs bilaterally, assisted with rollator    Data: Labs 06/23/2019:  AChR binding 57.1*, blocking 57*, modulating 59*  Lab Results  Component Value Date   VITAMINB12 1,073 (H) 03/27/2019   MRI lumbar spine 03/25/2023: 1. No acute fracture or traumatic listhesis. 2. Progressive degenerative changes in the upper lumbar spine at T12-L1 and L1-L2. 3. Eccentric left disc bulge at T12-L1 with narrowing of the left lateral recess, new from  prior exam. 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 4. Moderate bilateral neural foraminal narrowing at L1-L2, progressed from prior exam.    Thank you for allowing me to participate in patient's care.  If I can answer any additional questions, I would be pleased to do so.    Sincerely,    Shilynn Hoch K. Tobie, DO

## 2023-11-19 NOTE — Patient Instructions (Signed)
 Reduce prednisone  to 9mg  (take half 10mg  tablet + four 1mg  tablets) for one month, then reduce to 8mg  (take half 10mg  tablet + three 1 mg tablets)  Continue duloxetine  30mg  daily  Continue mestinon  60mg  three times daily  Please send MyChart update in 2 months

## 2024-01-07 DIAGNOSIS — Z23 Encounter for immunization: Secondary | ICD-10-CM | POA: Diagnosis not present

## 2024-01-15 ENCOUNTER — Telehealth: Payer: Self-pay | Admitting: Neurology

## 2024-01-15 NOTE — Telephone Encounter (Signed)
 Called patient and left a message for a call back.

## 2024-01-15 NOTE — Telephone Encounter (Signed)
 Pt called and and stated that the prescription called: predniSONE  (DELTASONE ) 1 MG tablet  is working with the reduce dose. Please call her back. Thanks

## 2024-01-16 ENCOUNTER — Telehealth: Payer: Self-pay | Admitting: Neurology

## 2024-01-16 NOTE — Telephone Encounter (Signed)
 Patient son left a voicemail -returned a call to [employer]

## 2024-01-16 NOTE — Telephone Encounter (Signed)
Called patients son back and left message.

## 2024-01-16 NOTE — Telephone Encounter (Signed)
 Pt's son is returning call to discuss the   predniSONE  (DELTASONE ) 1 MG tablet    Please call. Thanks

## 2024-01-20 NOTE — Telephone Encounter (Signed)
 Called pt and talked with brad and informed him that per Dr. Anthony notes she should be on 8 mg of Prednisone  until further notice. He understood.

## 2024-01-23 DIAGNOSIS — Z23 Encounter for immunization: Secondary | ICD-10-CM | POA: Diagnosis not present

## 2024-02-12 DIAGNOSIS — L814 Other melanin hyperpigmentation: Secondary | ICD-10-CM | POA: Diagnosis not present

## 2024-02-12 DIAGNOSIS — C44629 Squamous cell carcinoma of skin of left upper limb, including shoulder: Secondary | ICD-10-CM | POA: Diagnosis not present

## 2024-02-12 DIAGNOSIS — L570 Actinic keratosis: Secondary | ICD-10-CM | POA: Diagnosis not present

## 2024-02-12 DIAGNOSIS — L821 Other seborrheic keratosis: Secondary | ICD-10-CM | POA: Diagnosis not present

## 2024-02-12 DIAGNOSIS — D0462 Carcinoma in situ of skin of left upper limb, including shoulder: Secondary | ICD-10-CM | POA: Diagnosis not present

## 2024-02-12 DIAGNOSIS — L720 Epidermal cyst: Secondary | ICD-10-CM | POA: Diagnosis not present

## 2024-02-12 DIAGNOSIS — C44519 Basal cell carcinoma of skin of other part of trunk: Secondary | ICD-10-CM | POA: Diagnosis not present

## 2024-02-12 DIAGNOSIS — Z85828 Personal history of other malignant neoplasm of skin: Secondary | ICD-10-CM | POA: Diagnosis not present

## 2024-02-12 DIAGNOSIS — D692 Other nonthrombocytopenic purpura: Secondary | ICD-10-CM | POA: Diagnosis not present

## 2024-02-29 ENCOUNTER — Other Ambulatory Visit: Payer: Self-pay | Admitting: Neurology

## 2024-03-11 DIAGNOSIS — E538 Deficiency of other specified B group vitamins: Secondary | ICD-10-CM | POA: Diagnosis not present

## 2024-03-11 DIAGNOSIS — E039 Hypothyroidism, unspecified: Secondary | ICD-10-CM | POA: Diagnosis not present

## 2024-03-11 DIAGNOSIS — E7800 Pure hypercholesterolemia, unspecified: Secondary | ICD-10-CM | POA: Diagnosis not present

## 2024-03-11 DIAGNOSIS — I700 Atherosclerosis of aorta: Secondary | ICD-10-CM | POA: Diagnosis not present

## 2024-03-11 DIAGNOSIS — Z1331 Encounter for screening for depression: Secondary | ICD-10-CM | POA: Diagnosis not present

## 2024-03-11 DIAGNOSIS — N1831 Chronic kidney disease, stage 3a: Secondary | ICD-10-CM | POA: Diagnosis not present

## 2024-03-11 DIAGNOSIS — S80811A Abrasion, right lower leg, initial encounter: Secondary | ICD-10-CM | POA: Diagnosis not present

## 2024-03-11 DIAGNOSIS — G7000 Myasthenia gravis without (acute) exacerbation: Secondary | ICD-10-CM | POA: Diagnosis not present

## 2024-03-11 DIAGNOSIS — S80812A Abrasion, left lower leg, initial encounter: Secondary | ICD-10-CM | POA: Diagnosis not present

## 2024-03-11 DIAGNOSIS — Z0000 Encounter for general adult medical examination without abnormal findings: Secondary | ICD-10-CM | POA: Diagnosis not present

## 2024-03-11 DIAGNOSIS — R600 Localized edema: Secondary | ICD-10-CM | POA: Diagnosis not present

## 2024-03-11 DIAGNOSIS — I10 Essential (primary) hypertension: Secondary | ICD-10-CM | POA: Diagnosis not present

## 2024-03-23 ENCOUNTER — Encounter (HOSPITAL_COMMUNITY): Payer: Self-pay

## 2024-03-23 ENCOUNTER — Emergency Department (HOSPITAL_COMMUNITY)
Admission: EM | Admit: 2024-03-23 | Discharge: 2024-03-23 | Disposition: A | Discharge status: Home / Self Care | Attending: Emergency Medicine | Admitting: Emergency Medicine

## 2024-03-23 ENCOUNTER — Other Ambulatory Visit: Payer: Self-pay

## 2024-03-23 ENCOUNTER — Emergency Department (HOSPITAL_COMMUNITY)

## 2024-03-23 DIAGNOSIS — S93401A Sprain of unspecified ligament of right ankle, initial encounter: Secondary | ICD-10-CM

## 2024-03-23 DIAGNOSIS — S99911A Unspecified injury of right ankle, initial encounter: Secondary | ICD-10-CM | POA: Diagnosis present

## 2024-03-23 DIAGNOSIS — Z79899 Other long term (current) drug therapy: Secondary | ICD-10-CM | POA: Diagnosis not present

## 2024-03-23 DIAGNOSIS — Z7982 Long term (current) use of aspirin: Secondary | ICD-10-CM | POA: Diagnosis not present

## 2024-03-23 DIAGNOSIS — I129 Hypertensive chronic kidney disease with stage 1 through stage 4 chronic kidney disease, or unspecified chronic kidney disease: Secondary | ICD-10-CM | POA: Diagnosis not present

## 2024-03-23 DIAGNOSIS — N1831 Chronic kidney disease, stage 3a: Secondary | ICD-10-CM | POA: Diagnosis not present

## 2024-03-23 DIAGNOSIS — W19XXXA Unspecified fall, initial encounter: Secondary | ICD-10-CM | POA: Diagnosis not present

## 2024-03-23 MED ORDER — DICLOFENAC SODIUM 1 % EX GEL
2.0000 g | Freq: Two times a day (BID) | CUTANEOUS | Status: DC
  Administered 2024-03-23: 13:00:00 2 g via TOPICAL
  Filled 2024-03-23: qty 100

## 2024-03-23 MED ORDER — ACETAMINOPHEN 500 MG PO TABS
1000.0000 mg | ORAL_TABLET | Freq: Once | ORAL | Status: AC
  Administered 2024-03-23: 13:00:00 1000 mg via ORAL
  Filled 2024-03-23: qty 2

## 2024-03-23 NOTE — Progress Notes (Signed)
 Orthopedic Tech Progress Note Patient Details:  Audrey Cowan Oct 26, 1940 [id number]  Ortho Devices Type of Ortho Device: ASO Ortho Device/Splint Interventions: Ordered, Application, Adjustment   Post Interventions Patient Tolerated: Well Instructions Provided: Care of device, Adjustment of device  Audrey Cowan 03/23/2024, 1:24 PM

## 2024-03-23 NOTE — Discharge Instructions (Addendum)
 You came to the ED for a fall and new right ankle pain. An Xray of your right ankle shows no new fractures or movement of your previous plates or screws. You very likely sprained your ankle, and can treat this with over-the-counter medications like Voltaren  gel and Tylenol . You can pick these medications up at any drug store.  Voltaren  gel can be applied to the skin up to three times a day to help reduce pain.   For the Tylenol , you can take an extra strength Tylenol  (500mg ) every 6 hours as needed for pain. I would avoid ibuprofen and other NSAIDs given your history of kidney disease and because you currently take Aspirin .   Follow up with your primary care doctor within a week if your symptoms persist or get worse.

## 2024-03-23 NOTE — ED Provider Notes (Signed)
 [place] EMERGENCY DEPARTMENT AT [facility] Provider Note   CSN: [id number] Arrival date & time: 03/23/24  1117     Patient presents with: Fall and Ankle Pain   Audrey Cowan is a 83 y.o. female with a past medical history of myasthenia gravis, HTN, CKD3a, osteopenia, and prior right trimalleolar fracture of her right ankle who presents to the ED after a fall around 0600 this morning. She was getting up to use the restroom, ambulating with her walker. After urinating, she had gotten up to get back to bed, and believes her right foot/ankle had become caught on a wheel of her rollator. She then fell without hitting her head, losing consciousness, or biting her tongue. She did ger herself to her bed. She has baseline weakness from her MG, but had more difficulties with pain and getting out of bed. She called her son for help, who then prompted the patient to EMS.   She is not on anticoagulants. She denies losing feeling to her feet past her baseline neuropathy. Endorses a history of her prior right trimalleolar repair, however is unsure if she broke her ankle with this fall. She cannot bear weight on her right foot and would like to be assessed for a potential fracture today.     The history is provided by the patient.  Fall  Ankle Pain     Prior to Admission medications  Medication Sig Start Date End Date Taking? Authorizing Provider  ALPRAZolam  (XANAX ) 0.5 MG tablet Take 0.5 mg by mouth at bedtime. May take an additional tablet during the day if needed. 06/29/20   [provider]  amLODipine  (NORVASC ) 10 MG tablet Take 1 tablet (10 mg total) by mouth daily. 07/04/19   Jillian Buttery, MD  aspirin  EC 81 MG tablet Take 81 mg by mouth every other day. Nighttime med  Takes it Monday, Wednesday, and Friday.    [provider]  atorvastatin  (LIPITOR) 20 MG tablet Take 20 mg by mouth at bedtime.     [provider]  Cholecalciferol  (VITAMIN D ) 50 MCG  (2000 UT) tablet Take 2,000 Units by mouth at bedtime.    [provider]  DULoxetine  (CYMBALTA ) 30 MG capsule Take 1 capsule (30 mg total) by mouth daily. 11/19/23   Patel, Donika K, DO  furosemide  (LASIX ) 20 MG tablet Take 10 mg by mouth daily. 1/2 tablet every other day    [provider]  ibuprofen (ADVIL) 600 MG tablet Take 600 mg by mouth 3 (three) times daily. 08/09/20   [provider]  levothyroxine  (SYNTHROID ) 88 MCG tablet Take 88 mcg by mouth daily before breakfast.    [provider]  pantoprazole  (PROTONIX ) 40 MG tablet Take 1 tablet (40 mg total) by mouth daily. 07/04/19 02/19/23  Jillian Buttery, MD  predniSONE  (DELTASONE ) 1 MG tablet Take 9mg  daily (take 5mg  + 4mg )  x 1 month, then 8mg  daily (take 5mg  + 3mg ) and continue 11/19/23   Patel, Donika K, DO  predniSONE  (DELTASONE ) 5 MG tablet TAKE 1 TABLET BY MOUTH DAILY 03/02/24   Patel, Donika K, DO  pyridostigmine  (MESTINON ) 60 MG tablet TAKE 1 TABLET BY MOUTH AT 7AM, 1 TABLET AT NOON AND 1 TABLET AT 5PM 11/19/23   Patel, Donika K, DO  vitamin B-12 (CYANOCOBALAMIN ) 1000 MCG tablet Take 1,000 mcg by mouth once a week. Mondays    [provider]    Allergies: Other    Review of Systems  Constitutional: Negative.  HENT: Negative.    Respiratory: Negative.    Gastrointestinal: Negative.   Genitourinary: Negative.   Musculoskeletal:  Positive for joint swelling.  Skin: Negative.   Neurological:  Positive for weakness.       Chronic  Hematological: Negative.   Psychiatric/Behavioral: Negative.      Updated Vital Signs BP (!) 141/76 (BP Location: Left Arm)   Pulse 74   Temp 97.6 F (36.4 C) (Oral)   Resp 18   SpO2 99%   Physical Exam Constitutional:      General: She is not in acute distress.    Appearance: Normal appearance. She is not ill-appearing.  HENT:     Mouth/Throat:     Mouth: Mucous membranes are moist.  Cardiovascular:     Rate and Rhythm: Normal rate and regular  rhythm.     Pulses: Normal pulses.  Pulmonary:     Effort: Pulmonary effort is normal.     Breath sounds: Normal breath sounds.  Abdominal:     General: Abdomen is flat. Bowel sounds are normal.  Musculoskeletal:        General: Swelling and tenderness present.     Cervical back: Normal range of motion.     Right lower leg: Edema present.     Left lower leg: No edema.     Comments: Right ankle pain to palpation on lateral malleolus.   Skin:    General: Skin is warm.  Neurological:     General: No focal deficit present.     Mental Status: She is alert and oriented to person, place, and time.  Psychiatric:        Mood and Affect: Mood normal.        Behavior: Behavior normal.    (all labs ordered are listed, but only abnormal results are displayed) Labs Reviewed - No data to display  EKG: None  Radiology: DG Ankle Complete Right Result Date: 03/23/2024 CLINICAL DATA:  Fall and trauma to the right ankle. EXAM: RIGHT ANKLE - COMPLETE 3+ VIEW COMPARISON:  None Available. FINDINGS: Status post prior ORIF of distal fibula and tibia. The hardware is intact. No acute fracture or dislocation. The bones are osteopenic. There is diffuse subcutaneous edema. No soft tissue gas. IMPRESSION: 1. No acute fracture or dislocation. 2. Prior ORIF of distal fibula and tibia. Electronically Signed   By: Vanetta Chou M.D.   On: 03/23/2024 12:51     Procedures   Medications Ordered in the ED  diclofenac  Sodium (VOLTAREN ) 1 % topical gel 2 g (2 g Topical Given 03/23/24 1302)  acetaminophen  (TYLENOL ) tablet 1,000 mg (1,000 mg Oral Given 03/23/24 1301)                                    Medical Decision Making [name] presents to the ED after a fall and new right ankle pain. This involves an extensive number of treatment options, and is a complaint that carries with it a high risk of complications and morbidity. The differential diagnosis includes intracranial hemorrhage,  subdural/epidural hematoma, vertebral fracture, spinal cord injury, muscle strain, skull fracture, fracture. Denies hitting her head or losing consciousness during the fall, new weakness, or any changes in neurologic status. History of prior surgical repair to her right ankle without complication. On chronic steroids for MG.   On reevaluation, patient is sitting in no acute distress resting comfortably. She has an ASO lace-up brace  applied to her right ankle. Discussed with the patient and her son the results of the XR, noting no acute fractures or movement of her prior surgical hardware. Likely has an acute ankle sprain given the tenderness to palpation of her right lateral malleolus. Discussed RICE treatment and over the counter measures, such as Voltaren  gel and Tylenol  500mg  every 6 hours as needed for pain. Patient and the patient's son voiced understanding and are amenable to plan. Informed patient to follow up with her outpatient primary care physician within a week to ensure her symptoms improve. Patient is agreeable to plan.    Comorbidities that complicate the patient evaluation: medical history of Myasthenia Gravis, osteopenia, chronic prednisone  and pyridostigmine  use.   Additional history obtained: from the patient's son at bedside  External records from outside source obtained and reviewed.   Problem List / ED Course / Critical interventions / Medication management  Patient presented for Fall. Patient with stable vitals and does not appear to be in distress.  The patient was maintained on a cardiac monitor.  I personally viewed and interpreted the cardiac monitored which showed an underlying sinus rhythm. I ordered imaging studies including XR right ankle complete . I independently visualized and interpreted imaging which showed no acute fracture or dislocation of prior surgical hardware. I agree with the radiologist interpretation.  Patient had an unremarkable physical exam and a  score of 0 for the Nexus C-spine and Canadian head CT score and so imaging was not obtained at this time. Patient will be encouraged to follow-up with primary care provider to be reevaluated in the next few days.  Using the Canadian CT Score, I did not find it necessary to obtain a head CT due to lack of blood thinners, seizures, signs of skull fractures, retrograde amnesia, and vomiting. Using Nexus C-spine Score, I did not find it necessary to obtain imaging of C-spine due to lack of focal neurologic deficit, midline spinal tenderness, altered level of consciousness, intoxication, and distracting injury. I have reviewed the patients home medicines and have made adjustments as needed.   The patient has been appropriately medically screened and/or stabilized in the ED. I have low suspicion for any other emergent medical condition which would require further screening, evaluation or treatment in the ED or require inpatient management. At time of discharge the patient is hemodynamically stable and in no acute distress. I have discussed work-up results and diagnosis with patient and answered all questions. Patient is agreeable with discharge plan. We discussed strict return precautions for returning to the emergency department and they verbalized understanding.     DDx: These are considered less likely due to history of present illness and physical exam findings -Intracranial hemorrhage, subdural/epidural hematoma: Canadian head CT score of 0, no neurodeficits -Vertebral fracture: No seatbelt sign, no midline tenderness, no step-off/crepitus/abnormalities palpated -Spinal cord injury: Nexus C-spine and Canadian head CT score of 0, no neurodeficits -Skull fracture: No postauricular ecchymosis, no periorbital ecchymosis, no hemotympanum -Fracture: No step-offs/crepitus/abnormalities palpated in head, neck, chest, upper extremities, lower extremities, pelvis. XR of ankle negative for fracture.  Nexus  C-spine: 0 Canadian Head CT: 0   Social Determinants of Health: none  Amount and/or Complexity of Data Reviewed Radiology: ordered and independent interpretation performed. ECG/medicine tests: independent interpretation performed.  Risk OTC drugs.  Critical Care Total time providing critical care: 45 minutes       Final diagnoses:  Sprain of right ankle, unspecified ligament, initial encounter    ED Discharge Orders  None        Pinchus Weckwerth, DO 03/23/24 1416    Doretha Folks, MD 03/23/24 [id number]

## 2024-03-23 NOTE — ED Triage Notes (Signed)
 BIB EMS from home, mechanical fall at 0630 today. Started noticing pain in right ankle a few hours later. No blood thinners, no head injury

## 2024-03-23 NOTE — ED Notes (Signed)
Ortho Tech called. 

## 2024-04-06 ENCOUNTER — Other Ambulatory Visit: Payer: Self-pay | Admitting: Family Medicine

## 2024-04-06 DIAGNOSIS — Z1231 Encounter for screening mammogram for malignant neoplasm of breast: Secondary | ICD-10-CM

## 2024-04-14 ENCOUNTER — Ambulatory Visit: Admitting: Neurology

## 2024-04-14 ENCOUNTER — Encounter: Payer: Self-pay | Admitting: Neurology

## 2024-04-14 VITALS — BP 132/70 | HR 91 | Ht 64.5 in | Wt 154.0 lb

## 2024-04-14 DIAGNOSIS — G629 Polyneuropathy, unspecified: Secondary | ICD-10-CM

## 2024-04-14 DIAGNOSIS — G7001 Myasthenia gravis with (acute) exacerbation: Secondary | ICD-10-CM | POA: Diagnosis not present

## 2024-04-14 MED ORDER — PREDNISONE 5 MG PO TABS: ORAL_TABLET | ORAL | 0 refills | Status: AC

## 2024-04-14 MED ORDER — PREDNISONE 1 MG PO TABS: ORAL_TABLET | ORAL | 1 refills | Status: AC

## 2024-04-14 NOTE — Progress Notes (Signed)
 "   Follow-up Visit   Date: 04/14/2024   Audrey Cowan MRN: [id number] DOB: [birth year]   Interim History: Audrey Cowan is a 84 y.o. right-handed Caucasian female with hypothyroidism, hyperlipidemia, anxiety/CKD stage3, hypertension, and vitamin B12 deficiency returning to the clinic for follow-up of myasthenia gravis. The patient was accompanied to the clinic by self.  IMPRESSION/PLAN: Seropositive generalized myasthenia gravis without exacerbation (06/2019).  Previously required monthly IVIG (3/221-03/2020) and highest dose of prednisone  was 20mg /d.  No evidence of disease manifestation. She has been on prednisone  10mg  daily for quite some time, and has been tolerating slow taper.  - Reduce prednisone  to 7mg  daily x 1 month, and continue to reduce 1mg  per month   - Reduce mestinon  to 60mg  twice daily at 7a and 3pm  - Monitor for new or worsening symptoms while tapering medications  Peripheral neuropathy manifesting with numbness, stable  - Continue duloxetine  30mg  daily  Return to clinic 3 months  ----------------------------------------------------------- UPDATE 12/18/2021:  She as been doing well from a MG standpoint.  No double vision, ptosis, difficulty with speech/swallow, or weakness.  She has been on prednisone  7.5mg  daily.   She continues to be bothered by imbalance and numbness.  She uses a walker at all times.  She has been walking in the pool for balance exercises.  She remains frustrated with the numbness in the legs and feet.   UPDATE 07/20/2022:  She is here for acute visit .  Over the past week, son has noticed mild slurred speech and two days ago, he noticed that her left eyelid was almost completely closed.  Yesterday, she slept most of the day and today, she is feeling well.  No spells of slurred vision, double vision, or weakness.  No fever, illness, or new medications.  Her good friend passed away over the weekend, so she has understandably been grieving this.     UPDATE 10/23/2022:  She is here for follow-up visit.  Her droopy eyelids and slurred speech took about 3 weeks to improve after she was started on prednisone  10mg .  Since May, she has not had any breakthrough weakness.  She continues to be bothered by the numbness and tingling in the feet.  Her balance is poor and she is always using a walker.  She has not had any falls.    UPDATE 11/19/2023:  She is here for follow-up visit.  Her myasthenia gravis is well-controlled on prednisone  10mg  and mestinon  60mg  three times daily.  No difficulty with speech/swallow, double vision, droopy eyelids, or limb weakness.  She did not have any worsening pain after reducing duloxetine  to 30mg  daily.  She is expressing her frustration with healthcare because she feels that as she ages, less care is being delivered, such as no longer requiring colonoscopy after the age of 84.  Her son, Arvella, is currently getting treatment for prostate cancer and was unable to accompany her to the visit today.   UPDATE 04/14/2024:  Discussed the use of AI scribe software for clinical note transcription with the patient, who gave verbal consent to proceed.  History of Present Illness Audrey Cowan is an 84 year old female with myasthenia gravis who presents with foot pain and swelling following a fall. She is accompanied by her son, Arvella, who is her primary caregiver.  She has a history of myasthenia gravis and is currently taking Mestinon  (pyridostigmine ) three times a day and prednisone  8 mg daily. She also takes duloxetine  once a day for pain,  although it does not help her numbness. Her son helps manage her medications, ensuring she takes them as prescribed. She denies any weakness, double vision, ptosis, or difficulty with speech/swallow.   She frequently feels cold and experiences numbness in her feet rather than tingling or burning. She finds attending multiple medical appointments exhausting.  She fell on December 15th, 2025, after  getting tangled in the corner of a cavity, twisting her right ankle and foot. X-rays at the emergency room showed no fractures despite having six screws, a plate, and a rod in her leg. Since the fall, she has experienced pain and swelling in the right foot, particularly on the outer side, and a bruise on the bottom of her foot. She was unable to walk from December 15th through Christmas week but has since regained some strength and mobility. She will be starting PT.      Medications:  Current Outpatient Medications on File Prior to Visit  Medication Sig Dispense Refill   ALPRAZolam  (XANAX ) 0.5 MG tablet Take 0.5 mg by mouth at bedtime. May take an additional tablet during the day if needed.     amLODipine  (NORVASC ) 10 MG tablet Take 1 tablet (10 mg total) by mouth daily. 30 tablet 1   aspirin  EC 81 MG tablet Take 81 mg by mouth every other day. Nighttime med  Takes it Monday, Wednesday, and Friday.     atorvastatin  (LIPITOR) 20 MG tablet Take 20 mg by mouth at bedtime.      Cholecalciferol  (VITAMIN D ) 50 MCG (2000 UT) tablet Take 2,000 Units by mouth at bedtime.     DULoxetine  (CYMBALTA ) 30 MG capsule Take 1 capsule (30 mg total) by mouth daily. 90 capsule 3   furosemide  (LASIX ) 20 MG tablet Take 10 mg by mouth daily. 1/2 tablet every other day     ibuprofen (ADVIL) 600 MG tablet Take 600 mg by mouth 3 (three) times daily. (Patient taking differently: Take 600 mg by mouth every 8 (eight) hours as needed.)     levothyroxine  (SYNTHROID ) 88 MCG tablet Take 88 mcg by mouth daily before breakfast.     predniSONE  (DELTASONE ) 1 MG tablet Take 9mg  daily (take 5mg  + 4mg )  x 1 month, then 8mg  daily (take 5mg  + 3mg ) and continue 120 tablet 5   predniSONE  (DELTASONE ) 5 MG tablet TAKE 1 TABLET BY MOUTH DAILY (Patient taking differently: Take 8 mg by mouth daily.) 90 tablet 0   pyridostigmine  (MESTINON ) 60 MG tablet TAKE 1 TABLET BY MOUTH AT 7AM, 1 TABLET AT NOON AND 1 TABLET AT 5PM 270 tablet 3   vitamin  B-12 (CYANOCOBALAMIN ) 1000 MCG tablet Take 1,000 mcg by mouth once a week. Mondays     pantoprazole  (PROTONIX ) 40 MG tablet Take 1 tablet (40 mg total) by mouth daily. (Patient not taking: Reported on 04/14/2024) 30 tablet 1   No current facility-administered medications on file prior to visit.    Allergies:  Allergies  Allergen Reactions   Other Other (See Comments)    Pt has Myasthenia gravis so all anesthesia must be pre-approved    Vital Signs:  BP 132/70   Pulse 91   Ht 5' 4.5 (1.638 m)   Wt 154 lb (69.9 kg)   SpO2 95%   BMI 26.03 kg/m    Neurological Exam: MENTAL STATUS including orientation to time, place, person, recent and remote memory, attention span and concentration, language, and fund of knowledge is normal.  Speech is clear, no dysarthria   CRANIAL  NERVES:   Normal conjugate, extra-ocular eye movements in all directions of gaze.  Mild left ptosis at baseline no worsening  with sustained upgaze.  Oribicular oculi, buccinator, and orbicularis oris 5/5.    MOTOR:  Motor strength is 5/5 in the arms and legs.  No fatigability.  No pronator drift.    MSRs:                                           Right        Left brachioradialis 2+  2+  biceps 2+  2+  triceps 2+  2+  patellar 3+  3+  ankle jerk 1+  1+   SENSATION:  Absent vibration below the ankles bilaterally, intact at the knees.  COORDINATION/GAIT:  Normal finger-to- nose-finger.  Gait not tested.   Data: Labs 06/23/2019:  AChR binding 57.1*, blocking 57*, modulating 59*  Lab Results  Component Value Date   VITAMINB12 1,073 (H) 03/27/2019   MRI lumbar spine 03/25/2023: 1. No acute fracture or traumatic listhesis. 2. Progressive degenerative changes in the upper lumbar spine at T12-L1 and L1-L2. 3. Eccentric left disc bulge at T12-L1 with narrowing of the left lateral recess, new from prior exam. 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 4. Moderate  bilateral neural foraminal narrowing at L1-L2, progressed from prior exam.    Thank you for allowing me to participate in patient's care.  If I can answer any additional questions, I would be pleased to do so.    Sincerely,    Magon Croson K. Tobie, DO "

## 2024-04-14 NOTE — Patient Instructions (Signed)
 January:  prednisone  7mg  February:  prednisone  6mg  March: March 5mg   Reduce mestinon  to 60mg  1 tablet at 7a and 3pm

## 2024-04-23 ENCOUNTER — Ambulatory Visit
Admission: RE | Admit: 2024-04-23 | Discharge: 2024-04-23 | Disposition: A | Source: Ambulatory Visit | Attending: Family Medicine | Admitting: Family Medicine

## 2024-04-23 DIAGNOSIS — Z1231 Encounter for screening mammogram for malignant neoplasm of breast: Secondary | ICD-10-CM

## 2024-06-10 ENCOUNTER — Ambulatory Visit: Payer: Self-pay | Admitting: Neurology
